# Patient Record
Sex: Female | Born: 1940
Health system: Southern US, Community
[De-identification: ages and names within clinical notes are randomized; demographics above are authoritative.]

## PROBLEM LIST (undated history)

## (undated) DIAGNOSIS — I639 Cerebral infarction, unspecified: Secondary | ICD-10-CM

## (undated) DIAGNOSIS — N289 Disorder of kidney and ureter, unspecified: Secondary | ICD-10-CM

## (undated) DIAGNOSIS — E785 Hyperlipidemia, unspecified: Secondary | ICD-10-CM

## (undated) DIAGNOSIS — E119 Type 2 diabetes mellitus without complications: Secondary | ICD-10-CM

## (undated) DIAGNOSIS — I10 Essential (primary) hypertension: Secondary | ICD-10-CM

## (undated) DIAGNOSIS — Z9289 Personal history of other medical treatment: Secondary | ICD-10-CM

## (undated) DIAGNOSIS — D649 Anemia, unspecified: Secondary | ICD-10-CM

## (undated) DIAGNOSIS — M8580 Other specified disorders of bone density and structure, unspecified site: Secondary | ICD-10-CM

## (undated) DIAGNOSIS — G709 Myoneural disorder, unspecified: Secondary | ICD-10-CM

## (undated) DIAGNOSIS — E039 Hypothyroidism, unspecified: Secondary | ICD-10-CM

## (undated) DIAGNOSIS — C801 Malignant (primary) neoplasm, unspecified: Secondary | ICD-10-CM

## (undated) DIAGNOSIS — I214 Non-ST elevation (NSTEMI) myocardial infarction: Secondary | ICD-10-CM

## (undated) DIAGNOSIS — I4891 Unspecified atrial fibrillation: Secondary | ICD-10-CM

## (undated) HISTORY — DX: Other specified disorders of bone density and structure, unspecified site: M85.80

## (undated) HISTORY — DX: Personal history of other medical treatment: Z92.89

## (undated) HISTORY — PX: CHOLECYSTECTOMY: SHX55

## (undated) HISTORY — PX: MASTECTOMY: SHX3

## (undated) HISTORY — DX: Type 2 diabetes mellitus without complications: E11.9

## (undated) HISTORY — DX: Essential (primary) hypertension: I10

## (undated) HISTORY — PX: BREAST SURGERY: SHX581

## (undated) HISTORY — DX: Hyperlipidemia, unspecified: E78.5

## (undated) HISTORY — DX: Disorder of kidney and ureter, unspecified: N28.9

## (undated) HISTORY — PX: TUBAL LIGATION: SHX77

## (undated) HISTORY — DX: Anemia, unspecified: D64.9

## (undated) HISTORY — PX: BREAST ENHANCEMENT SURGERY: SHX7

---

## 1998-05-20 HISTORY — PX: BREAST IMPLANT REMOVAL: SUR1101

## 1998-09-29 ENCOUNTER — Encounter: Admission: RE | Admit: 1998-09-29 | Discharge: 1998-12-28 | Payer: Self-pay | Admitting: Family Medicine

## 1999-02-19 ENCOUNTER — Other Ambulatory Visit: Admission: RE | Admit: 1999-02-19 | Discharge: 1999-02-19 | Payer: Self-pay | Admitting: Obstetrics and Gynecology

## 1999-03-30 ENCOUNTER — Ambulatory Visit (HOSPITAL_COMMUNITY): Admission: RE | Admit: 1999-03-30 | Discharge: 1999-03-30 | Payer: Self-pay | Admitting: Neurosurgery

## 1999-03-30 ENCOUNTER — Encounter: Payer: Self-pay | Admitting: Neurosurgery

## 1999-04-18 ENCOUNTER — Ambulatory Visit (HOSPITAL_COMMUNITY): Admission: RE | Admit: 1999-04-18 | Discharge: 1999-04-18 | Payer: Self-pay | Admitting: Neurosurgery

## 1999-04-18 ENCOUNTER — Encounter: Payer: Self-pay | Admitting: Neurosurgery

## 1999-04-26 ENCOUNTER — Encounter: Payer: Self-pay | Admitting: Internal Medicine

## 1999-04-26 ENCOUNTER — Encounter (INDEPENDENT_AMBULATORY_CARE_PROVIDER_SITE_OTHER): Payer: Self-pay

## 1999-04-26 ENCOUNTER — Inpatient Hospital Stay (HOSPITAL_COMMUNITY): Admission: EM | Admit: 1999-04-26 | Discharge: 1999-05-01 | Payer: Self-pay | Admitting: Internal Medicine

## 1999-04-26 ENCOUNTER — Encounter (INDEPENDENT_AMBULATORY_CARE_PROVIDER_SITE_OTHER): Payer: Self-pay | Admitting: Specialist

## 1999-04-30 ENCOUNTER — Encounter: Payer: Self-pay | Admitting: Internal Medicine

## 1999-05-02 ENCOUNTER — Encounter: Payer: Self-pay | Admitting: Neurosurgery

## 1999-05-02 ENCOUNTER — Ambulatory Visit (HOSPITAL_COMMUNITY): Admission: RE | Admit: 1999-05-02 | Discharge: 1999-05-02 | Payer: Self-pay | Admitting: Neurosurgery

## 1999-05-16 ENCOUNTER — Ambulatory Visit (HOSPITAL_COMMUNITY): Admission: RE | Admit: 1999-05-16 | Discharge: 1999-05-16 | Payer: Self-pay | Admitting: Neurosurgery

## 1999-05-16 ENCOUNTER — Encounter: Payer: Self-pay | Admitting: Neurosurgery

## 1999-06-13 ENCOUNTER — Ambulatory Visit (HOSPITAL_COMMUNITY): Admission: RE | Admit: 1999-06-13 | Discharge: 1999-06-13 | Payer: Self-pay | Admitting: Gastroenterology

## 1999-06-16 ENCOUNTER — Encounter: Payer: Self-pay | Admitting: Gastroenterology

## 1999-06-16 ENCOUNTER — Ambulatory Visit (HOSPITAL_COMMUNITY): Admission: RE | Admit: 1999-06-16 | Discharge: 1999-06-16 | Payer: Self-pay | Admitting: Gastroenterology

## 1999-06-26 ENCOUNTER — Encounter: Payer: Self-pay | Admitting: Plastic Surgery

## 1999-06-26 ENCOUNTER — Ambulatory Visit (HOSPITAL_COMMUNITY): Admission: RE | Admit: 1999-06-26 | Discharge: 1999-06-26 | Payer: Self-pay | Admitting: Plastic Surgery

## 1999-07-11 ENCOUNTER — Ambulatory Visit (HOSPITAL_COMMUNITY): Admission: RE | Admit: 1999-07-11 | Discharge: 1999-07-11 | Payer: Self-pay | Admitting: Plastic Surgery

## 1999-07-11 ENCOUNTER — Encounter: Payer: Self-pay | Admitting: Plastic Surgery

## 1999-08-16 ENCOUNTER — Other Ambulatory Visit: Admission: RE | Admit: 1999-08-16 | Discharge: 1999-08-16 | Payer: Self-pay | Admitting: Plastic Surgery

## 1999-08-16 ENCOUNTER — Encounter (INDEPENDENT_AMBULATORY_CARE_PROVIDER_SITE_OTHER): Payer: Self-pay | Admitting: Specialist

## 2000-02-21 ENCOUNTER — Other Ambulatory Visit: Admission: RE | Admit: 2000-02-21 | Discharge: 2000-02-21 | Payer: Self-pay | Admitting: Obstetrics and Gynecology

## 2000-02-22 ENCOUNTER — Encounter: Admission: RE | Admit: 2000-02-22 | Discharge: 2000-02-22 | Payer: Self-pay | Admitting: Internal Medicine

## 2000-02-22 ENCOUNTER — Encounter: Payer: Self-pay | Admitting: Internal Medicine

## 2000-05-20 HISTORY — PX: COLONOSCOPY: SHX174

## 2001-04-07 ENCOUNTER — Other Ambulatory Visit: Admission: RE | Admit: 2001-04-07 | Discharge: 2001-04-07 | Payer: Self-pay | Admitting: Obstetrics and Gynecology

## 2002-04-12 ENCOUNTER — Other Ambulatory Visit: Admission: RE | Admit: 2002-04-12 | Discharge: 2002-04-12 | Payer: Self-pay | Admitting: Obstetrics and Gynecology

## 2004-06-29 ENCOUNTER — Emergency Department (HOSPITAL_COMMUNITY): Admission: EM | Admit: 2004-06-29 | Discharge: 2004-06-29 | Payer: Self-pay | Admitting: Emergency Medicine

## 2004-10-12 ENCOUNTER — Ambulatory Visit: Payer: Self-pay | Admitting: Internal Medicine

## 2004-11-05 ENCOUNTER — Ambulatory Visit: Payer: Self-pay | Admitting: Internal Medicine

## 2004-12-07 ENCOUNTER — Encounter: Admission: RE | Admit: 2004-12-07 | Discharge: 2005-03-07 | Payer: Self-pay | Admitting: Internal Medicine

## 2005-03-11 ENCOUNTER — Ambulatory Visit: Payer: Self-pay | Admitting: Internal Medicine

## 2005-05-03 ENCOUNTER — Ambulatory Visit: Payer: Self-pay | Admitting: Internal Medicine

## 2005-06-17 ENCOUNTER — Ambulatory Visit: Payer: Self-pay | Admitting: Internal Medicine

## 2005-09-30 ENCOUNTER — Ambulatory Visit: Payer: Self-pay | Admitting: Internal Medicine

## 2005-10-09 ENCOUNTER — Ambulatory Visit: Payer: Self-pay | Admitting: Internal Medicine

## 2005-11-11 ENCOUNTER — Ambulatory Visit: Payer: Self-pay | Admitting: Internal Medicine

## 2006-01-22 ENCOUNTER — Ambulatory Visit: Payer: Self-pay | Admitting: Internal Medicine

## 2006-01-31 ENCOUNTER — Ambulatory Visit: Payer: Self-pay | Admitting: Internal Medicine

## 2006-02-17 ENCOUNTER — Ambulatory Visit: Payer: Self-pay | Admitting: Internal Medicine

## 2006-02-20 ENCOUNTER — Ambulatory Visit: Payer: Self-pay | Admitting: Internal Medicine

## 2006-03-03 ENCOUNTER — Ambulatory Visit: Payer: Self-pay | Admitting: Internal Medicine

## 2006-03-14 ENCOUNTER — Encounter: Admission: RE | Admit: 2006-03-14 | Discharge: 2006-03-14 | Payer: Self-pay | Admitting: Internal Medicine

## 2006-03-24 ENCOUNTER — Encounter: Admission: RE | Admit: 2006-03-24 | Discharge: 2006-03-24 | Payer: Self-pay | Admitting: Internal Medicine

## 2006-03-28 ENCOUNTER — Ambulatory Visit: Payer: Self-pay | Admitting: Internal Medicine

## 2006-04-04 ENCOUNTER — Encounter: Admission: RE | Admit: 2006-04-04 | Discharge: 2006-04-04 | Payer: Self-pay | Admitting: Internal Medicine

## 2006-04-04 HISTORY — PX: OTHER SURGICAL HISTORY: SHX169

## 2006-05-06 ENCOUNTER — Ambulatory Visit: Payer: Self-pay | Admitting: Internal Medicine

## 2006-06-20 ENCOUNTER — Ambulatory Visit: Payer: Self-pay | Admitting: Internal Medicine

## 2006-06-20 LAB — CONVERTED CEMR LAB
ALT: 16 units/L (ref 0–40)
AST: 22 units/L (ref 0–37)
BUN: 8 mg/dL (ref 6–23)
Basophils Relative: 2.1 % — ABNORMAL HIGH (ref 0.0–1.0)
Creatinine, Ser: 1 mg/dL (ref 0.4–1.2)
Eosinophils Absolute: 0.3 10*3/uL (ref 0.0–0.6)
Eosinophils Relative: 6.6 % — ABNORMAL HIGH (ref 0.0–5.0)
Free T4: 0.6 ng/dL (ref 0.6–1.6)
HCT: 36.1 % (ref 36.0–46.0)
HDL: 43.3 mg/dL (ref >39.0)
MCV: 89.3 fL (ref 78.0–100.0)
Neutrophils Relative %: 65 % (ref 43.0–77.0)
Platelets: 251 10*3/uL (ref 150–400)
Potassium: 4 meq/L (ref 3.5–5.1)
RBC: 4.05 M/uL (ref 3.87–5.11)
RDW: 13.5 % (ref 11.5–14.6)
TSH: 9.63 microintl units/mL — ABNORMAL HIGH (ref 0.35–5.50)
Triglycerides: 208 mg/dL (ref 0–149)
VLDL: 42 mg/dL — ABNORMAL HIGH (ref 0–40)
WBC: 4.9 10*3/uL (ref 4.5–10.5)

## 2006-06-24 DIAGNOSIS — I10 Essential (primary) hypertension: Secondary | ICD-10-CM | POA: Insufficient documentation

## 2006-06-24 DIAGNOSIS — M949 Disorder of cartilage, unspecified: Secondary | ICD-10-CM

## 2006-06-24 DIAGNOSIS — M899 Disorder of bone, unspecified: Secondary | ICD-10-CM | POA: Insufficient documentation

## 2006-06-27 ENCOUNTER — Ambulatory Visit: Payer: Self-pay | Admitting: Internal Medicine

## 2006-08-30 ENCOUNTER — Emergency Department (HOSPITAL_COMMUNITY): Admission: EM | Admit: 2006-08-30 | Discharge: 2006-08-31 | Payer: Self-pay | Admitting: Pediatrics

## 2006-09-09 ENCOUNTER — Ambulatory Visit: Payer: Self-pay | Admitting: Internal Medicine

## 2006-10-14 ENCOUNTER — Ambulatory Visit: Payer: Self-pay | Admitting: Internal Medicine

## 2006-10-14 LAB — CONVERTED CEMR LAB
LDL Cholesterol: 46 mg/dL (ref 0–99)
TSH: 5.69 microintl units/mL — ABNORMAL HIGH (ref 0.35–5.50)
Total CHOL/HDL Ratio: 2.6
Triglycerides: 110 mg/dL (ref 0–149)

## 2006-10-21 ENCOUNTER — Telehealth: Payer: Self-pay | Admitting: Internal Medicine

## 2006-10-31 ENCOUNTER — Encounter: Payer: Self-pay | Admitting: Internal Medicine

## 2006-11-25 ENCOUNTER — Telehealth (INDEPENDENT_AMBULATORY_CARE_PROVIDER_SITE_OTHER): Payer: Self-pay | Admitting: *Deleted

## 2006-12-11 ENCOUNTER — Ambulatory Visit: Payer: Self-pay | Admitting: Internal Medicine

## 2006-12-11 LAB — CONVERTED CEMR LAB
ALT: 13 units/L (ref 0–35)
AST: 20 units/L (ref 0–37)
Cholesterol: 120 mg/dL (ref 0–200)
TSH: 1.99 microintl units/mL (ref 0.35–5.50)
Total CHOL/HDL Ratio: 3.1

## 2006-12-16 ENCOUNTER — Ambulatory Visit: Payer: Self-pay | Admitting: Internal Medicine

## 2006-12-16 LAB — CONVERTED CEMR LAB: LDL Goal: 100 mg/dL

## 2006-12-31 ENCOUNTER — Encounter: Payer: Self-pay | Admitting: Internal Medicine

## 2007-03-11 ENCOUNTER — Encounter: Payer: Self-pay | Admitting: Internal Medicine

## 2007-06-22 ENCOUNTER — Ambulatory Visit: Payer: Self-pay | Admitting: Internal Medicine

## 2007-06-22 DIAGNOSIS — E782 Mixed hyperlipidemia: Secondary | ICD-10-CM | POA: Insufficient documentation

## 2007-06-22 DIAGNOSIS — R5381 Other malaise: Secondary | ICD-10-CM | POA: Insufficient documentation

## 2007-06-22 DIAGNOSIS — R5383 Other fatigue: Secondary | ICD-10-CM

## 2007-06-22 DIAGNOSIS — M6530 Trigger finger, unspecified finger: Secondary | ICD-10-CM | POA: Insufficient documentation

## 2007-06-22 DIAGNOSIS — E1129 Type 2 diabetes mellitus with other diabetic kidney complication: Secondary | ICD-10-CM | POA: Insufficient documentation

## 2007-06-29 ENCOUNTER — Encounter (INDEPENDENT_AMBULATORY_CARE_PROVIDER_SITE_OTHER): Payer: Self-pay | Admitting: *Deleted

## 2007-06-29 LAB — CONVERTED CEMR LAB
BUN: 16 mg/dL (ref 6–23)
Basophils Absolute: 0.1 10*3/uL (ref 0.0–0.1)
Hemoglobin: 11.6 g/dL — ABNORMAL LOW (ref 12.0–15.0)
Hgb A1c MFr Bld: 5.7 % (ref 4.6–6.0)
Lymphocytes Relative: 36.1 % (ref 12.0–46.0)
MCHC: 32.5 g/dL (ref 30.0–36.0)
Monocytes Absolute: 0.4 10*3/uL (ref 0.2–0.7)
Monocytes Relative: 10.9 % (ref 3.0–11.0)
Neutro Abs: 1.9 10*3/uL (ref 1.4–7.7)
Platelets: 251 10*3/uL (ref 150–400)
RDW: 12.8 % (ref 11.5–14.6)

## 2007-08-26 ENCOUNTER — Telehealth (INDEPENDENT_AMBULATORY_CARE_PROVIDER_SITE_OTHER): Payer: Self-pay | Admitting: *Deleted

## 2007-09-08 ENCOUNTER — Telehealth (INDEPENDENT_AMBULATORY_CARE_PROVIDER_SITE_OTHER): Payer: Self-pay | Admitting: *Deleted

## 2007-09-15 ENCOUNTER — Ambulatory Visit: Payer: Self-pay | Admitting: Internal Medicine

## 2007-09-23 ENCOUNTER — Encounter: Admission: RE | Admit: 2007-09-23 | Discharge: 2007-10-08 | Payer: Self-pay | Admitting: Internal Medicine

## 2007-09-28 ENCOUNTER — Encounter: Payer: Self-pay | Admitting: Internal Medicine

## 2007-10-08 ENCOUNTER — Encounter: Payer: Self-pay | Admitting: Internal Medicine

## 2007-11-06 ENCOUNTER — Telehealth (INDEPENDENT_AMBULATORY_CARE_PROVIDER_SITE_OTHER): Payer: Self-pay | Admitting: *Deleted

## 2007-12-28 ENCOUNTER — Ambulatory Visit: Payer: Self-pay | Admitting: Internal Medicine

## 2008-01-02 LAB — CONVERTED CEMR LAB: Uric Acid, Serum: 7.7 mg/dL — ABNORMAL HIGH (ref 2.4–7.0)

## 2008-01-04 ENCOUNTER — Encounter (INDEPENDENT_AMBULATORY_CARE_PROVIDER_SITE_OTHER): Payer: Self-pay | Admitting: *Deleted

## 2008-01-07 ENCOUNTER — Ambulatory Visit: Payer: Self-pay | Admitting: Internal Medicine

## 2008-01-07 DIAGNOSIS — N3941 Urge incontinence: Secondary | ICD-10-CM | POA: Insufficient documentation

## 2008-03-30 ENCOUNTER — Telehealth (INDEPENDENT_AMBULATORY_CARE_PROVIDER_SITE_OTHER): Payer: Self-pay | Admitting: *Deleted

## 2008-07-08 ENCOUNTER — Ambulatory Visit: Payer: Self-pay | Admitting: Internal Medicine

## 2008-07-25 LAB — CONVERTED CEMR LAB
AST: 20 units/L (ref 0–37)
Alkaline Phosphatase: 47 units/L (ref 39–117)
BUN: 15 mg/dL (ref 6–23)
Bilirubin, Direct: 0.2 mg/dL (ref 0.0–0.3)
Cholesterol: 115 mg/dL (ref 0–200)
Creatinine, Ser: 1.2 mg/dL (ref 0.4–1.2)
HDL: 43.8 mg/dL (ref >39.0)
TSH: 1.97 microintl units/mL (ref 0.35–5.50)
Total Bilirubin: 1.1 mg/dL (ref 0.3–1.2)
VLDL: 24 mg/dL (ref 0–40)

## 2008-07-26 ENCOUNTER — Encounter (INDEPENDENT_AMBULATORY_CARE_PROVIDER_SITE_OTHER): Payer: Self-pay | Admitting: *Deleted

## 2008-08-03 ENCOUNTER — Telehealth (INDEPENDENT_AMBULATORY_CARE_PROVIDER_SITE_OTHER): Payer: Self-pay | Admitting: *Deleted

## 2008-09-15 ENCOUNTER — Ambulatory Visit: Payer: Self-pay | Admitting: Internal Medicine

## 2008-09-16 ENCOUNTER — Ambulatory Visit: Payer: Self-pay | Admitting: Internal Medicine

## 2008-09-17 LAB — CONVERTED CEMR LAB
BUN: 14 mg/dL (ref 6–23)
Potassium: 3.8 meq/L (ref 3.5–5.1)

## 2008-09-19 ENCOUNTER — Encounter (INDEPENDENT_AMBULATORY_CARE_PROVIDER_SITE_OTHER): Payer: Self-pay | Admitting: *Deleted

## 2008-10-06 ENCOUNTER — Encounter: Payer: Self-pay | Admitting: Internal Medicine

## 2009-01-16 ENCOUNTER — Ambulatory Visit: Payer: Self-pay | Admitting: Internal Medicine

## 2009-01-17 ENCOUNTER — Telehealth (INDEPENDENT_AMBULATORY_CARE_PROVIDER_SITE_OTHER): Payer: Self-pay | Admitting: *Deleted

## 2009-02-10 ENCOUNTER — Ambulatory Visit: Payer: Self-pay | Admitting: Internal Medicine

## 2009-02-16 ENCOUNTER — Encounter (INDEPENDENT_AMBULATORY_CARE_PROVIDER_SITE_OTHER): Payer: Self-pay | Admitting: *Deleted

## 2009-02-17 LAB — CONVERTED CEMR LAB
AST: 20 units/L (ref 0–37)
Albumin: 3.8 g/dL (ref 3.5–5.2)
Alkaline Phosphatase: 48 units/L (ref 39–117)
BUN: 8 mg/dL (ref 6–23)
Hgb A1c MFr Bld: 5.5 % (ref 4.6–6.5)
LDL Cholesterol: 38 mg/dL (ref 0–99)
Potassium: 4 meq/L (ref 3.5–5.1)
TSH: 6.72 microintl units/mL — ABNORMAL HIGH (ref 0.35–5.50)
Total Bilirubin: 0.9 mg/dL (ref 0.3–1.2)
Total CHOL/HDL Ratio: 2

## 2009-02-22 ENCOUNTER — Telehealth (INDEPENDENT_AMBULATORY_CARE_PROVIDER_SITE_OTHER): Payer: Self-pay | Admitting: *Deleted

## 2009-03-01 ENCOUNTER — Ambulatory Visit: Payer: Self-pay | Admitting: Internal Medicine

## 2009-03-01 DIAGNOSIS — E559 Vitamin D deficiency, unspecified: Secondary | ICD-10-CM | POA: Insufficient documentation

## 2009-05-01 ENCOUNTER — Ambulatory Visit: Payer: Self-pay | Admitting: Internal Medicine

## 2009-05-04 LAB — CONVERTED CEMR LAB
ALT: 13 units/L (ref 0–35)
AST: 19 units/L (ref 0–37)
Alkaline Phosphatase: 47 units/L (ref 39–117)
HDL: 48.1 mg/dL (ref >39.00)
TSH: 1.25 microintl units/mL (ref 0.35–5.50)
Total Bilirubin: 0.7 mg/dL (ref 0.3–1.2)
Total CHOL/HDL Ratio: 3
Triglycerides: 105 mg/dL (ref 0.0–149.0)

## 2009-05-05 ENCOUNTER — Encounter (INDEPENDENT_AMBULATORY_CARE_PROVIDER_SITE_OTHER): Payer: Self-pay | Admitting: *Deleted

## 2009-05-08 ENCOUNTER — Ambulatory Visit: Payer: Self-pay | Admitting: Internal Medicine

## 2009-05-24 ENCOUNTER — Telehealth (INDEPENDENT_AMBULATORY_CARE_PROVIDER_SITE_OTHER): Payer: Self-pay | Admitting: *Deleted

## 2009-07-31 ENCOUNTER — Ambulatory Visit: Payer: Self-pay | Admitting: Internal Medicine

## 2009-08-01 ENCOUNTER — Encounter: Payer: Self-pay | Admitting: Internal Medicine

## 2009-08-01 LAB — CONVERTED CEMR LAB: Vit D, 25-Hydroxy: 37 ng/mL (ref 30–89)

## 2009-08-07 ENCOUNTER — Ambulatory Visit: Payer: Self-pay | Admitting: Internal Medicine

## 2009-08-07 LAB — CONVERTED CEMR LAB
BUN: 8 mg/dL (ref 6–23)
Creatinine, Ser: 0.8 mg/dL (ref 0.4–1.2)

## 2009-10-30 ENCOUNTER — Telehealth (INDEPENDENT_AMBULATORY_CARE_PROVIDER_SITE_OTHER): Payer: Self-pay | Admitting: *Deleted

## 2009-12-04 ENCOUNTER — Telehealth (INDEPENDENT_AMBULATORY_CARE_PROVIDER_SITE_OTHER): Payer: Self-pay | Admitting: *Deleted

## 2010-06-19 NOTE — Progress Notes (Signed)
Summary: Refill Request  Phone Note Refill Request Call back at Home Phone [phone] Message from:  Patient on October 30, 2009 9:23 AM  Refills Requested: Medication #1:  NEURONTIN 300 MG  CAPS TAKE 1 EVERYDAY   Dosage confirmed as above?Dosage Confirmed   Supply Requested: 1 month  Method Requested: Pick up at Office Next Appointment Scheduled: Sept 19 2011 Initial call taken by: Harold Barban,  October 30, 2009 9:24 AM  Follow-up for Phone Call        Spoke with patient, reason for taking med>Neuropathy in feet and legs @ night   Patient would like to pick up RX tomorrow or Wed, I informed patient it will be at the front waiting Follow-up by: Shonna Chock,  October 30, 2009 1:23 PM    New/Updated Medications: NEURONTIN 300 MG  CAPS (GABAPENTIN) 1 by mouth once daily as needed [id number] in feet and legs @ night Prescriptions: NEURONTIN 300 MG  CAPS (GABAPENTIN) 1 by mouth once daily as needed [id number] in feet and legs @ night  #90 x 1   Entered by:   Shonna Chock   Authorized by:   Marga Melnick MD   Signed by:   Shonna Chock on 10/30/2009   Method used:   Print then Give to Patient   RxID:   [id number]

## 2010-06-19 NOTE — Assessment & Plan Note (Signed)
Summary: 3 MONTH FOLLOWUP, TO DISCUSS LABS////SPH   Vital Signs:  Patient profile:   70 year old female Weight:      159.0 pounds Pulse rate:   64 / minute Resp:     15 per minute BP sitting:   140 / 84  (left arm) Cuff size:   large  Vitals Entered By: Shonna Chock (August 07, 2009 9:22 AM) CC: 1.) 3 Month follow-up (copy of labs given)-Refill HCTZ and Carvediol  2.) Cold all the time 3.) Discuss vit B12 and CoQ10 supplements Comments REVIEWED MED LIST, PATIENT AGREED DOSE AND INSTRUCTION CORRECT    CC:  1.) 3 Month follow-up (copy of labs given)-Refill HCTZ and Carvediol  2.) Cold all the time 3.) Discuss vit B12 and CoQ10 supplements.  History of Present Illness: Vitamin D only up from 35 to 37 with 50,000 International Units weekly. Other labs reviewed ; A1c now in NON Diabetes range. She is on [employer]  with 57# loss, now a Lifetime Member. FBS 80-95; rare  hypoglycemia when she skips a meal.  Allergies: 1)  * Aldactone  Review of Systems Eyes:  Denies blurring, double vision, and vision loss-both eyes; No retinopathy. CV:  Denies chest pain or discomfort, lightheadness, and near fainting. Derm:  Denies poor wound healing. Neuro:  Complains of numbness and tingling. Endo:  Denies excessive hunger, excessive thirst, and excessive urination.  Physical Exam  General:  well-nourished,looks great; alert,appropriate and cooperative throughout examination Heart:  Normal rate and regular rhythm. Increased  S2 normal without gallop, murmur, click, rub . S4 with slurring Pulses:  R and L carotid,radial,dorsalis pedis and posterior tibial pulses are full and equal bilaterally Extremities:  No clubbing, cyanosis, edema. Neurologic:  alert & oriented X3.     Impression & Recommendations:  Problem # 1:  DIABETES MELLITUS, TYPE II, CONTROLLED (ICD-250.00) A1c now in NON Diabetes range The following medications were removed from the medication list:    Metformin Hcl 1000 Mg  Tabs (Metformin hcl) .Marland Kitchen... 1/2 once daily with largest meal Her updated medication list for this problem includes:    Benazepril Hcl 40 Mg Tabs (Benazepril hcl) .Marland Kitchen... 1 two times a day  Problem # 2:  VITAMIN D DEFICIENCY (ICD-268.9) essentially unchanged  Complete Medication List: 1)  Neurontin 300 Mg Caps (Gabapentin) .... Take 1 everyday 2)  L-thyroxine  .... 2 tabs daily except 1 and 1/2 tabs weds only 3)  Carvedilol 25 Mg Tabs (Carvedilol) .Marland Kitchen.. 1 by mouth bid 4)  Vytorin 10-20 Mg Tabs (Ezetimibe-simvastatin) .... 1/2 at bedtime m,w, f & sun only 5)  Benazepril Hcl 40 Mg Tabs (Benazepril hcl) .Marland Kitchen.. 1 two times a day 6)  Hydrochlorothiazide 12.5 Mg Tabs (Hydrochlorothiazide) .Marland Kitchen.. 1 by mouth qd 7)  Accu-chek Compact Strp (Glucose blood) .... Pt test 1 time a day 8)  Vitamin D (ergocalciferol) 50000 Unit Caps (Ergocalciferol) .Marland Kitchen.. 1pill once weekly  Patient Instructions: 1)  Please schedule a follow-up appointment in 6 months. 2)  BMP prior to visit, ICD-9: 3)  Hepatic Panel prior to visit, ICD-9: 4)  Lipid Panel prior to visit, ICD-9: 5)  TSH prior to visit, ICD-9: 6)  HbgA1C prior to visit, ICD-9: 7)  Urine Microalbumin prior to visit, ICD-9:; vitamin D level Prescriptions: CARVEDILOL 25 MG  TABS (CARVEDILOL) 1 by mouth bid  #180 x 1   Entered and Authorized by:   Marga Melnick MD   Signed by:   Marga Melnick MD on 08/07/2009  Method used:   Printed then faxed to ...       [facility] [address] (retail)       [phone]       [place], [place]  [address]       Ph: [phone]       Fax: [phone]   RxID:   [id number] HYDROCHLOROTHIAZIDE 12.5 MG  TABS (HYDROCHLOROTHIAZIDE) 1 by mouth qd  #90 x 1   Entered and Authorized by:   Marga Melnick MD   Signed by:   Marga Melnick MD on 08/07/2009   Method used:   Printed then faxed to ...       [facility] [address] (retail)       [phone]       [place], [place]  [address]       Ph: [phone]       Fax: [phone]   RxID:   [id number] VITAMIN D (ERGOCALCIFEROL) 50000 UNIT CAPS (ERGOCALCIFEROL) 1pill ONCE WEEKLY  #12 x 1   Entered and Authorized by:   Marga Melnick MD   Signed by:   Marga Melnick MD on 08/07/2009   Method used:   Printed then faxed to ...       [facility] [address] (retail)       [phone]       [place], [place]  [address]       Ph: [phone]       Fax: [phone]   RxID:   [id number]

## 2010-06-19 NOTE — Progress Notes (Signed)
Summary: DOES SHE NEED LAB WORK IN MARCH??  Phone Note Call from Patient Call back at [facility] Phone [phone]   Caller: Patient Summary of Call: PATIENT SAYS HER NOTE AT BOTTOM OF DEC 2010 OFFICE VISIT SAYS TO SCHEDULE A 3  MONTH FOLLOWUP  SHE SAYS SHE NEEDS LABS TOO, SO I GAVE HER AN APPOINTMENT FOR 07/31/2009 FOR LAB WORK--REPEATING DEC LABS, BUT I HAD NO DIAGNOSIS CODES  SHE HAS A OFFICE VISIT WITH DR HOPPER FOR 08/07/2009  PLEASE VERIFY IF SHE NEEDS THE LAB WORK DONE AGAIN AND --IF SO, TELL ME THE DIAG CODES AND I WILL CALL HER TO TELL HER TO KEEP THE LAB APPOINTMENT AS WELL AS DOCTOR APPOINTMENT  IF NOT , LET ME KNOW AND I WILL CALL HER AND TELL HER LAB APPOINTMENT IS NOT NECESSARY  IT IS OK TO LEAVE MESSAGE Initial call taken by: Jerolyn Shin,  May 24, 2009 4:25 PM  Follow-up for Phone Call        BUN,creat,K+;vit D level;HbgA1C 995.20,268.9,250.00,272.2..........Marland KitchenFelecia Deloach CMA  May 25, 2009 10:45 AM   Additional Follow-up for Phone Call Additional follow up Details #1::        left pt detail message both appt are necessary, codes added to lab orders.........Marland KitchenFelecia Deloach CMA  May 25, 2009 10:49 AM

## 2010-06-19 NOTE — Progress Notes (Signed)
Summary: refill  Phone Note Refill Request Call back at Home Phone [phone] Message from:  Patient  Refills Requested: Medication #1:  BENAZEPRIL HCL 40 MG  TABS 1 two times a day  Medication #2:  L-THYROXINE 2 tabs daily except 1 and 1/2 tabs Weds only 90 day supply patient will pick up to send to mail order    Initial call taken by: Okey Regal [place],  December 04, 2009 3:36 PM  Follow-up for Phone Call        Left message on VM inform patient rx will be avaliable tomorrow between 8am:5pm Follow-up by: Shonna Chock CMA,  December 04, 2009 3:52 PM    Prescriptions: L-THYROXINE 2 tabs daily except 1 and 1/2 tabs Weds only  #100 x 1   Entered by:   Shonna Chock CMA   Authorized by:   Marga Melnick MD   Signed by:   Shonna Chock CMA on 12/04/2009   Method used:   Print then Give to Patient   RxID:   [id number] BENAZEPRIL HCL 40 MG  TABS (BENAZEPRIL HCL) 1 two times a day  #180 x 1   Entered by:   Shonna Chock CMA   Authorized by:   Marga Melnick MD   Signed by:   Shonna Chock CMA on 12/04/2009   Method used:   Print then Give to Patient   RxID:   [id number]

## 2010-07-04 ENCOUNTER — Encounter: Payer: Self-pay | Admitting: Internal Medicine

## 2010-07-04 ENCOUNTER — Ambulatory Visit (INDEPENDENT_AMBULATORY_CARE_PROVIDER_SITE_OTHER): Payer: Medicare Other | Admitting: Internal Medicine

## 2010-07-04 ENCOUNTER — Other Ambulatory Visit: Payer: Self-pay | Admitting: Internal Medicine

## 2010-07-04 DIAGNOSIS — R609 Edema, unspecified: Secondary | ICD-10-CM

## 2010-07-04 DIAGNOSIS — E559 Vitamin D deficiency, unspecified: Secondary | ICD-10-CM

## 2010-07-04 DIAGNOSIS — E119 Type 2 diabetes mellitus without complications: Secondary | ICD-10-CM

## 2010-07-04 DIAGNOSIS — E039 Hypothyroidism, unspecified: Secondary | ICD-10-CM

## 2010-07-04 DIAGNOSIS — I10 Essential (primary) hypertension: Secondary | ICD-10-CM

## 2010-07-04 LAB — BUN: BUN: 14 mg/dL (ref 6–23)

## 2010-07-11 NOTE — Assessment & Plan Note (Signed)
Summary: sinus/kn   Vital Signs:  Patient profile:   70 year old female Weight:      166.6 pounds BMI:     26.78 Temp:     98.8 degrees F oral Pulse rate:   56 / minute Resp:     15 per minute BP sitting:   140 / 78  (left arm) Cuff size:   large  Vitals Entered By: Shonna Chock CMA (July 04, 2010 11:54 AM) CC: 1.) Sinuses x few days: examine right side of face  2.) Refill Meds , URI symptoms   CC:  1.) Sinuses x few days: examine right side of face  2.) Refill Meds  and URI symptoms.  History of Present Illness:    Onset 07/01/2010 as "knot" R maxillary area; she now reports nasal congestion, but denies purulent nasal discharge, sore throat, productive cough, and earache.  The patient denies fever, dyspnea, and wheezing.  The patient also reports frontal pressure  headache.  Risk factors for Strep sinusitis include unilateral facial burning  pain in subcutaneous tissues.  The patient denies the following risk factors for Strep sinusitis: tooth pain and tender adenopathy. Note : she is on an ACE-I. Rx: NSAIDS      The patient also presents for Hypertension follow-up.  The patient reports intermittent  fatigue, but denies lightheadedness, urinary frequency, pedal edema, angioedema of lips & tongue and rash.  The patient denies the following associated symptoms: chest pain, chest pressure, exercise intolerance, dyspnea, palpitations, and syncope.  Compliance with medications (by patient report) has been near 100%.  The patient reports that dietary compliance has been excellent.  The patient reports exercising 3 X per week.  Adjunctive measures currently used by the patient include salt restriction.  BP @ home : 150-170/50-60.   Allergies: 1)  * Aldactone  Review of Systems MS:  Note : vit D 50,000 International Units lat taken 4 weeks ago.  Physical Exam  General:  well-nourished,in no acute distress; alert,appropriate and cooperative throughout examination Eyes:  No corneal or  conjunctival inflammation noted. EOMI. Perrla. Vision grossly normal. Ears:  External ear exam shows no significant lesions or deformities.  Otoscopic examination reveals clear canals, tympanic membranes are intact bilaterally without bulging, retraction, inflammation or discharge. Hearing is grossly normal bilaterally. Nose:  External nasal examination shows no deformity or inflammation. Nasal mucosa are pink and moist without lesions or exudates. Mouth:  Oral mucosa and oropharynx without lesions or exudates.  Teeth in good repair. Edematous ,slightly tender subcutaneous changes over R maxilla Lungs:  Normal respiratory effort, chest expands symmetrically. Lungs are clear to auscultation, no crackles or wheezes. Heart:  regular rhythm, no gallop, no rub, no JVD, bradycardia, and grade 1/2  /6 systolic murmur.   Pulses:  R and L carotid,radial,dorsalis pedis and posterior tibial pulses are full and equal bilaterally Extremities:  No clubbing, cyanosis, edema. Skin:  see facial changes Cervical Nodes:  No lymphadenopathy noted Axillary Nodes:  No palpable lymphadenopathy Psych:  memory intact for recent and remote, normally interactive, and good eye contact.     Impression & Recommendations:  Problem # 1:  EDEMA- LOCALIZED (ICD-782.3)  R/O angioedema variant from ACE-I Her updated medication list for this problem includes:    Hydrochlorothiazide 12.5 Mg Tabs (Hydrochlorothiazide) .Marland Kitchen... 1 by mouth qd  Orders: Venipuncture (81191)  Problem # 2:  HYPERTENSION (ICD-401.9)  The following medications were removed from the medication list:    Benazepril Hcl 40 Mg Tabs (Benazepril hcl) .Marland KitchenMarland KitchenMarland KitchenMarland Kitchen  1 two times a day Her updated medication list for this problem includes:    Carvedilol 25 Mg Tabs (Carvedilol) .Marland Kitchen... 1 by mouth bid    Hydrochlorothiazide 12.5 Mg Tabs (Hydrochlorothiazide) .Marland Kitchen... 1 by mouth qd    Amlodipine Besylate 5 Mg Tabs (Amlodipine besylate) .Marland Kitchen... 1 once daily (in place of  benazepril)  Orders: Venipuncture (04540) TLB-Creatinine, Blood (82565-CREA) TLB-Potassium (K+) (84132-K) TLB-BUN (Urea Nitrogen) (84520-BUN)  Problem # 3:  VITAMIN D DEFICIENCY (ICD-268.9)  Orders: Venipuncture (98119) T-Vitamin D (25-Hydroxy) (14782-95621)  Problem # 4:  HYPOTHYROIDISM (ICD-244.9)  Orders: Venipuncture (30865) TLB-TSH (Thyroid Stimulating Hormone) (84443-TSH)  Problem # 5:  DIABETES MELLITUS, TYPE II, CONTROLLED (ICD-250.00)  The following medications were removed from the medication list:    Benazepril Hcl 40 Mg Tabs (Benazepril hcl) .Marland Kitchen... 1 two times a day  Orders: TLB-A1C / Hgb A1C (Glycohemoglobin) (83036-A1C)  Complete Medication List: 1)  Neurontin 300 Mg Caps (Gabapentin) .Marland Kitchen.. 1 by mouth once daily as needed [id number] in feet and legs @ night 2)  L-thyroxine  .... 2 tabs daily except 1 and 1/2 tabs weds only 3)  Carvedilol 25 Mg Tabs (Carvedilol) .Marland Kitchen.. 1 by mouth bid 4)  Vytorin 10-20 Mg Tabs (Ezetimibe-simvastatin) .... 1/2 at bedtime m,w, f & sun only 5)  Hydrochlorothiazide 12.5 Mg Tabs (Hydrochlorothiazide) .Marland Kitchen.. 1 by mouth qd 6)  Accu-chek Compact Strp (Glucose blood) .... Pt test 1 time a day 7)  Vitamin D (ergocalciferol) 50000 Unit Caps (Ergocalciferol) .Marland Kitchen.. 1pill once weekly 8)  Amlodipine Besylate 5 Mg Tabs (Amlodipine besylate) .Marland Kitchen.. 1 once daily (in place of benazepril)  Patient Instructions: 1)  Report signs of Sinusitis(pain , pus & fever). 2)  Check your Blood Pressure regularly. If it is above:135/85  you should make an appointment. Prescriptions: HYDROCHLOROTHIAZIDE 12.5 MG  TABS (HYDROCHLOROTHIAZIDE) 1 by mouth qd  #90 x 1   Entered and Authorized by:   Marga Melnick MD   Signed by:   Marga Melnick MD on 07/04/2010   Method used:   Printed then faxed to ...       [facility] [address] (retail)       [phone]       [place], [place]  [address]       Ph: [phone]       Fax:  [phone]   RxID:   [id number] L-THYROXINE 2 tabs daily except 1 and 1/2 tabs Weds only  #100 x 1   Entered and Authorized by:   Marga Melnick MD   Signed by:   Marga Melnick MD on 07/04/2010   Method used:   Printed then faxed to ...       [facility] [address] (retail)       [phone]       [place], [place]  [address]       Ph: [phone]       Fax: [phone]   RxID:   [id number] NEURONTIN 300 MG  CAPS (GABAPENTIN) 1 by mouth once daily as needed [id number]:[id number] in feet and legs @ night  #90 x 1   Entered and Authorized by:   Marga Melnick MD   Signed by:   Marga Melnick MD on 07/04/2010   Method used:   Printed then faxed to ...       [facility] [address] (retail)  [address]       [place], [place]  [address]       Ph: [phone]       Fax: [phone]   RxID:   [id number] AMLODIPINE BESYLATE 5 MG TABS (AMLODIPINE BESYLATE) 1 once daily (in place of Benazepril)  #30 x 5   Entered and Authorized by:   Marga Melnick MD   Signed by:   Marga Melnick MD on 07/04/2010   Method used:   Electronically to        [facility] [address] (retail)       [phone]       [place], [place]  [address]       Ph: [phone]       Fax: [phone]   RxID:   [id number]    Orders Added: 1)  Est. Patient Level IV [66063] 2)  Venipuncture [01601] 3)  TLB-TSH (Thyroid Stimulating Hormone) [84443-TSH] 4)  TLB-Creatinine, Blood [82565-CREA] 5)  TLB-Potassium (K+) [84132-K] 6)  TLB-BUN (Urea Nitrogen) [84520-BUN] 7)  T-Vitamin D (25-Hydroxy) [09323-55732] 8)  TLB-A1C / Hgb A1C (Glycohemoglobin) [83036-A1C]

## 2010-08-06 ENCOUNTER — Telehealth: Payer: Self-pay

## 2010-08-16 NOTE — Progress Notes (Signed)
Summary: Triage: B/P Med concerns   Phone Note Call from Patient Call back at Home Phone [phone]   Caller: Patient Summary of Call: Message left on Triage voicemail: Please call to discuss B/P Meds   [facility] CMA  August 06, 2010 2:07 PM   Follow-up for Phone Call        Spoke with patient, patient was seen for swelling in face and B/P med was changed. 1-2 weeks into taking med patient with leg swelling and breaking out (hives). Patient stopped med last Friday, patient would like to know what to do about B/P. [facility]: [facility]   [phone], 159/49 (last 2 readings) Follow-up by: Shonna Chock CMA,  August 06, 2010 3:05 PM  Additional Follow-up for Phone Call Additional follow up Details #1::         change amlodipine to losartan 100 mg daily. Monitor blood pressure ; goal  is average less than 135/85. Additional Follow-up by: Marga Melnick MD,  August 06, 2010 4:52 PM   New Allergies: ! AMLODIPINE BESYLATE (AMLODIPINE BESYLATE) Additional Follow-up for Phone Call Additional follow up Details #2::    Spoke with patient, all information ok'd.  Follow-up by: Shonna Chock CMA,  August 06, 2010 5:07 PM  New/Updated Medications: LOSARTAN POTASSIUM 100 MG TABS (LOSARTAN POTASSIUM) 1 once daily New Allergies: ! AMLODIPINE BESYLATE (AMLODIPINE BESYLATE)Prescriptions: LOSARTAN POTASSIUM 100 MG TABS (LOSARTAN POTASSIUM) 1 once daily  #30 x 5   Entered and Authorized by:   Marga Melnick MD   Signed by:   Marga Melnick MD on 08/06/2010   Method used:   Electronically to        [facility] (retail)       [phone]       [place], [place]  [address]       Ph: [phone]       Fax: [phone]   RxID:   [id number]

## 2010-11-19 ENCOUNTER — Other Ambulatory Visit: Payer: Self-pay | Admitting: Internal Medicine

## 2010-11-19 NOTE — Telephone Encounter (Signed)
Patient called back and stated she would like a 30 day rx sent in for synthroid only([facility]), patient with several weeks worth of Carvedilol pills, patient never increased her synthroid as indicated on lab append from 07/04/10 (patient did not receive labs in mail), patient aware I will re-mail labs and discuss with Dr.Hopper if she should  Increase med now to 100mg  daily (TSH 6.74) and then recheck in 3 months or if he would like to go forward with re-checking level and then decided on med adjustments  Dr.Hopper please advise (patient aware Dr.Hopper is out of the office until Thursday afternoon)

## 2010-11-19 NOTE — Telephone Encounter (Signed)
Left message on voicemail informing patient she is due for labs based on lab append from 07/04/10:  recheck TSH with fasting Lipids & hepatic panel  in 3 months (244.9, 272.4, 995.20). (Labs were due 10/02/10)  Patient to also let me know when she calls back if she would like 30day rx sent to pharmacy to hold her over until labs returned and advised on.

## 2010-11-20 ENCOUNTER — Other Ambulatory Visit: Payer: Self-pay | Admitting: Internal Medicine

## 2010-11-20 DIAGNOSIS — E039 Hypothyroidism, unspecified: Secondary | ICD-10-CM

## 2010-11-20 DIAGNOSIS — E785 Hyperlipidemia, unspecified: Secondary | ICD-10-CM

## 2010-11-20 DIAGNOSIS — T887XXA Unspecified adverse effect of drug or medicament, initial encounter: Secondary | ICD-10-CM

## 2010-11-20 MED ORDER — AMBULATORY NON FORMULARY MEDICATION: Status: DC

## 2010-11-20 NOTE — Telephone Encounter (Signed)
Addended by: Edgardo Roys on: 11/20/2010 11:38 AM   Modules accepted: Orders

## 2010-11-20 NOTE — Telephone Encounter (Signed)
Recheck TSH now (244.9)

## 2010-11-20 NOTE — Telephone Encounter (Signed)
Spoke with patient, scheduled appointment for fasting labs for this Thursday

## 2010-11-22 ENCOUNTER — Other Ambulatory Visit (INDEPENDENT_AMBULATORY_CARE_PROVIDER_SITE_OTHER): Payer: Medicare Other

## 2010-11-22 DIAGNOSIS — E785 Hyperlipidemia, unspecified: Secondary | ICD-10-CM

## 2010-11-22 DIAGNOSIS — T887XXA Unspecified adverse effect of drug or medicament, initial encounter: Secondary | ICD-10-CM

## 2010-11-22 DIAGNOSIS — E039 Hypothyroidism, unspecified: Secondary | ICD-10-CM

## 2010-11-22 LAB — LIPID PANEL
Cholesterol: 190 mg/dL (ref 0–200)
HDL: 68.2 mg/dL (ref >39.00)
LDL Cholesterol: 100 mg/dL — ABNORMAL HIGH (ref 0–99)
Total CHOL/HDL Ratio: 3
Triglycerides: 107 mg/dL (ref 0.0–149.0)
VLDL: 21.4 mg/dL (ref 0.0–40.0)

## 2010-11-22 LAB — HEPATIC FUNCTION PANEL
Albumin: 4.1 g/dL (ref 3.5–5.2)
Alkaline Phosphatase: 58 U/L (ref 39–117)
Total Protein: 7.2 g/dL (ref 6.0–8.3)

## 2010-11-22 LAB — TSH: TSH: 5.1 u[IU]/mL (ref 0.35–5.50)

## 2010-11-22 NOTE — Progress Notes (Signed)
Labs only

## 2010-12-06 ENCOUNTER — Ambulatory Visit (INDEPENDENT_AMBULATORY_CARE_PROVIDER_SITE_OTHER): Payer: Medicare Other | Admitting: Internal Medicine

## 2010-12-06 ENCOUNTER — Encounter: Payer: Self-pay | Admitting: Internal Medicine

## 2010-12-06 DIAGNOSIS — E039 Hypothyroidism, unspecified: Secondary | ICD-10-CM

## 2010-12-06 DIAGNOSIS — E782 Mixed hyperlipidemia: Secondary | ICD-10-CM

## 2010-12-06 DIAGNOSIS — I10 Essential (primary) hypertension: Secondary | ICD-10-CM

## 2010-12-06 DIAGNOSIS — E559 Vitamin D deficiency, unspecified: Secondary | ICD-10-CM

## 2010-12-06 DIAGNOSIS — E119 Type 2 diabetes mellitus without complications: Secondary | ICD-10-CM

## 2010-12-06 MED ORDER — LEVOTHYROXINE SODIUM 112 MCG PO TABS: 112.0000 ug | ORAL_TABLET | Freq: Every day | ORAL | Status: DC

## 2010-12-06 MED ORDER — SIMVASTATIN 20 MG PO TABS: 20.0000 mg | ORAL_TABLET | Freq: Every evening | ORAL | Status: DC

## 2010-12-06 MED ORDER — CLONIDINE HCL 0.1 MG PO TABS: 0.1000 mg | ORAL_TABLET | Freq: Two times a day (BID) | ORAL | Status: DC

## 2010-12-06 NOTE — Progress Notes (Signed)
  Subjective:    Patient ID: Gloria Joseph, female    DOB: 26-Nov-1940, 70 y.o.   MRN: [id number]  HPI #1  HYPERTENSION: Disease Monitoring: Blood pressure range-152/55- 188/59  Chest pain, palpitations- no       Dyspnea- no Medications: Compliance- replacement for ACE-I not filled  Lightheadedness,Syncope- no    Edema- no   #2 DIABETES: Disease Monitoring: Blood Sugar ranges-not monitored  Polyuria/phagia/dipsia- no       Visual problems- no Medications: Compliance- no DM meds  Hypoglycemic symptoms- no  #3 HYPERLIPIDEMIA: Disease Monitoring: See symptoms for Hypertension Medications: Compliance- yes, Vytorin 10/20 mg 1/2 M, W, F , Sun;LDL 100, HDL 68.20  Abd pain, bowel changes- constipation   Muscle aches- no ROS See HPI above  PMH Smoking Status noted : never  #4 Thyroid function monitor  Medications status(change in dose/brand/mode of administration):no Constitutional: Weight change: up 5 #; Fatigue:yes; Sleep pattern:increased stress; Appetite:good  Hoarseness:no; Swallowing issues:no Derm: Change in nails/hair/skin:no Neuro: Numbness/tingling:no; Tremor:no Psych: Anxiety:yeswith stress; Depression:no; Panic attacks:no Endo: Temperature intolerance: Heat:no; Cold:yes TSH was 5.10 on July 5; on February 15 was 6.74.  #5 vitamin D deficiency :  The vitamin D level was 29 in February 2012. She is on 2000 international units of vitamin D 3 daily         Review of Systems     Objective:   Physical Exam Gen.:  well-nourished; in no acute distress Eyes: Extraocular motion intact; no lid lag or proptosis Neck:  No deformities, thyromegaly, masses, or tenderness noted.   Supple with full range of motion   Heart: Normal rhythm and rate without , gallop, or extra heart sounds. Grade 1/6 systolic murmur Lungs: Chest clear to auscultation without rales,rales, wheezes Neuro:Deep tendon reflexes are equal and within normal limits; no tremor  Skin: Warm and dry  without significant lesions or rashes; no onycholysis  All pulses intact without  bruits .No ischemic skin changes.   Psych: Normally communicative and interactive; no abnormal mood or affect clinically.         Assessment & Plan:  #1 hypertension, uncontrolled  #2 hypothyroidism,  TSH not at goal  #3 diabetes, diet controlled  #4 dyslipidemia,  Med & diet-controlled  #5 vitamin D deficiency questionable status  Plan: See orders

## 2010-12-06 NOTE — Patient Instructions (Signed)
Blood Pressure Goal  Ideally is an AVERAGE < 135/85. This AVERAGE should be calculated from @ least 5-7 BP readings taken @ different times of day on different days of week. You should not respond to isolated BP readings , but rather the AVERAGE for that week  Please  schedule fasting Labs in 10 weeks : BMET,Lipids, hepatic panel, TS, A1c

## 2010-12-11 ENCOUNTER — Other Ambulatory Visit: Payer: Self-pay | Admitting: Internal Medicine

## 2010-12-11 MED ORDER — GABAPENTIN 300 MG PO CAPS: 300.0000 mg | ORAL_CAPSULE | Freq: Every day | ORAL | Status: DC

## 2010-12-18 ENCOUNTER — Other Ambulatory Visit: Payer: Self-pay | Admitting: Internal Medicine

## 2010-12-18 MED ORDER — CARVEDILOL 25 MG PO TABS: 25.0000 mg | ORAL_TABLET | Freq: Two times a day (BID) | ORAL | Status: DC

## 2010-12-27 ENCOUNTER — Encounter: Payer: Self-pay | Admitting: Internal Medicine

## 2010-12-27 ENCOUNTER — Ambulatory Visit (INDEPENDENT_AMBULATORY_CARE_PROVIDER_SITE_OTHER): Payer: Medicare Other | Admitting: Internal Medicine

## 2010-12-27 DIAGNOSIS — I498 Other specified cardiac arrhythmias: Secondary | ICD-10-CM

## 2010-12-27 DIAGNOSIS — R42 Dizziness and giddiness: Secondary | ICD-10-CM

## 2010-12-27 DIAGNOSIS — I10 Essential (primary) hypertension: Secondary | ICD-10-CM

## 2010-12-27 DIAGNOSIS — R001 Bradycardia, unspecified: Secondary | ICD-10-CM

## 2010-12-27 MED ORDER — CARVEDILOL 25 MG PO TABS: 25.0000 mg | ORAL_TABLET | ORAL | Status: DC

## 2010-12-27 NOTE — Progress Notes (Signed)
  Subjective:    Patient ID: Gloria Joseph, female    DOB: 11-20-1940, 70 y.o.   MRN: [id number]  HPI  HYPERTENSION: Disease Monitoring  Blood pressure range: 116/46- 131/59 on average  Chest pain: no   Dyspnea: no   Claudication: no             Epistaxis :no             Headaches: minor pressure diffusely Medication compliance: yes  Medication Side Effects  Lightheadedness: yes, starting 1 hr after taking meds & lasting 3-4 hrs, NOT positional   Urinary frequency: no   Edema: yes, LLE > RLE  By end of day   Preventitive Healthcare:  Exercise: no, due to lightheadedness   Diet Pattern: W W 's  Salt Restriction: yes  Her blood pressure was not concordant with our readings. It read 160/53, much higher than values recorded with our cuff.      Review of Systems with the dizzy spells she has some blurring of vision. She has no diplopia or vision loss. She also denies hearing loss, tinnitus, or frank vertigo. She's not having benign positional vertigo symptoms. No hypoglycemia     Objective:   Physical Exam  Gen. appearance: Well-nourished, in no distress Eyes: Extraocular motion intact, field of vision WNL; no nystagmus but slight dizziness with EOM. Fundi benign n normal, vision grossly intact, no nystagmus ENT: Canals clear, tympanic membranes normal, hearing grossly normal Neck: Normal range of motion, no masses, normal thyroid Cardiovascular: SLOW rate and regular rhythm normal; no murmurs, gallops or extra heart sounds Muscle skeletal: Range of motion, tone, &  strength normal Neuro:no cranial nerve deficit, deep tendon  reflexes normal, gait normal. Romberg & finger to nose WNL Lymph: No cervical or axillary LA Skin: Warm and dry without suspicious lesions or rashes Psych: no anxiety or mood change. Normally interactive and cooperative.        Assessment & Plan:  #1 hypertension, well controlled, possibly over controlled. Home recordings are excellent but may be  actually lower based on comparison of her cuff to ours  #2 dizziness, temporary related to ingestion of her meds, no postural component.  #3 bradycardia Plan: see Orders

## 2010-12-27 NOTE — Patient Instructions (Signed)
Decrease carvedilol 25 mg to one half pill twice a day. Monitor blood pressure and symptoms after this change.Blood Pressure Goal  Ideally is an AVERAGE < 135/85. This AVERAGE should be calculated from @ least 5-7 BP readings taken @ different times of day on different days of week. You should not respond to isolated BP readings , but rather the AVERAGE for that week

## 2011-01-02 ENCOUNTER — Other Ambulatory Visit: Payer: Self-pay

## 2011-01-02 DIAGNOSIS — E039 Hypothyroidism, unspecified: Secondary | ICD-10-CM

## 2011-01-02 MED ORDER — LEVOTHYROXINE SODIUM 112 MCG PO TABS: 112.0000 ug | ORAL_TABLET | Freq: Every day | ORAL | Status: DC

## 2011-01-02 NOTE — Telephone Encounter (Signed)
RX's sent to pharmacies

## 2011-01-02 NOTE — Telephone Encounter (Signed)
Message left on voicemail: Patient would like a 30 day supply for [facility], 3 month supply [facility]

## 2011-02-02 ENCOUNTER — Emergency Department (HOSPITAL_COMMUNITY)
Admission: EM | Admit: 2011-02-02 | Discharge: 2011-02-02 | Disposition: A | Discharge status: Home / Self Care | Payer: Medicare Other | Attending: Emergency Medicine | Admitting: Emergency Medicine

## 2011-02-02 DIAGNOSIS — E039 Hypothyroidism, unspecified: Secondary | ICD-10-CM | POA: Insufficient documentation

## 2011-02-02 DIAGNOSIS — Z79899 Other long term (current) drug therapy: Secondary | ICD-10-CM | POA: Insufficient documentation

## 2011-02-02 DIAGNOSIS — I10 Essential (primary) hypertension: Secondary | ICD-10-CM | POA: Insufficient documentation

## 2011-02-02 DIAGNOSIS — I491 Atrial premature depolarization: Secondary | ICD-10-CM | POA: Insufficient documentation

## 2011-02-02 DIAGNOSIS — N390 Urinary tract infection, site not specified: Secondary | ICD-10-CM | POA: Insufficient documentation

## 2011-02-02 DIAGNOSIS — E119 Type 2 diabetes mellitus without complications: Secondary | ICD-10-CM | POA: Insufficient documentation

## 2011-02-02 DIAGNOSIS — R55 Syncope and collapse: Secondary | ICD-10-CM | POA: Insufficient documentation

## 2011-02-02 DIAGNOSIS — Z853 Personal history of malignant neoplasm of breast: Secondary | ICD-10-CM | POA: Insufficient documentation

## 2011-02-02 LAB — POCT I-STAT TROPONIN I
Troponin i, poc: 0.01 ng/mL (ref 0.00–0.08)
Troponin i, poc: 0.03 ng/mL (ref 0.00–0.08)

## 2011-02-02 LAB — URINE MICROSCOPIC-ADD ON

## 2011-02-02 LAB — COMPREHENSIVE METABOLIC PANEL
AST: 18 U/L (ref 0–37)
Albumin: 3.5 g/dL (ref 3.5–5.2)
Alkaline Phosphatase: 63 U/L (ref 39–117)
BUN: 21 mg/dL (ref 6–23)
Potassium: 3.7 mEq/L (ref 3.5–5.1)
Total Protein: 6.9 g/dL (ref 6.0–8.3)

## 2011-02-02 LAB — URINALYSIS, ROUTINE W REFLEX MICROSCOPIC
Bilirubin Urine: NEGATIVE
Glucose, UA: NEGATIVE mg/dL
Specific Gravity, Urine: 1.01 (ref 1.005–1.030)

## 2011-02-02 LAB — DIFFERENTIAL
Basophils Absolute: 0 10*3/uL (ref 0.0–0.1)
Basophils Relative: 1 % (ref 0–1)
Eosinophils Absolute: 0.3 10*3/uL (ref 0.0–0.7)
Eosinophils Relative: 5 % (ref 0–5)
Lymphocytes Relative: 25 % (ref 12–46)
Monocytes Absolute: 0.8 10*3/uL (ref 0.1–1.0)

## 2011-02-02 LAB — CBC
HCT: 32.4 % — ABNORMAL LOW (ref 36.0–46.0)
MCHC: 34.9 g/dL (ref 30.0–36.0)
Platelets: 214 10*3/uL (ref 150–400)
RDW: 12.3 % (ref 11.5–15.5)

## 2011-02-05 ENCOUNTER — Ambulatory Visit (INDEPENDENT_AMBULATORY_CARE_PROVIDER_SITE_OTHER): Payer: Medicare Other | Admitting: Internal Medicine

## 2011-02-05 ENCOUNTER — Encounter: Payer: Self-pay | Admitting: Internal Medicine

## 2011-02-05 DIAGNOSIS — E871 Hypo-osmolality and hyponatremia: Secondary | ICD-10-CM

## 2011-02-05 DIAGNOSIS — G629 Polyneuropathy, unspecified: Secondary | ICD-10-CM

## 2011-02-05 DIAGNOSIS — R42 Dizziness and giddiness: Secondary | ICD-10-CM

## 2011-02-05 DIAGNOSIS — I10 Essential (primary) hypertension: Secondary | ICD-10-CM

## 2011-02-05 DIAGNOSIS — D649 Anemia, unspecified: Secondary | ICD-10-CM

## 2011-02-05 DIAGNOSIS — E039 Hypothyroidism, unspecified: Secondary | ICD-10-CM

## 2011-02-05 DIAGNOSIS — G609 Hereditary and idiopathic neuropathy, unspecified: Secondary | ICD-10-CM

## 2011-02-05 DIAGNOSIS — E119 Type 2 diabetes mellitus without complications: Secondary | ICD-10-CM

## 2011-02-05 LAB — URINE CULTURE

## 2011-02-05 NOTE — Progress Notes (Signed)
  Subjective:    Patient ID: Gloria Joseph, female    DOB: [birth year], 70 y.o.   MRN: [id number]  HPI ER 9/14/ 2012 records reviewed; intermittent dizziness since. UTI documented; > 100,000 E coli sensitive Keflex . She was asymptomatic.Sodium 130; she is on HCTZ. PMH of HYPOKALEMIA with Spironolactone. Thyroid increased after TSH of 5.10 on 7/5. BP range : 122/42- 180/68 (9/16). Average 135/47- 163/51     Review of Systems Dizziness Onset:with last OV with med changes Context: Position change:no Benign positional vertigo symptoms:no Straining:no Pain:no Cardiac prodrome: no palpitations, irregular rhythm, heart rate change Neurologic prodrome: no headache, numbness and tingling, weakness, change in coordination (gait/falling) Syncope:no Seizure activity:no Upper respiratory tract infection/extrinsic symptoms:no Duration:few seconds Frequency:increased since ER, up to 6/day 9/14 eve, now 2-3 Associated signs and symptoms: Visual change (blurred/double/loss):no Hearing loss/tinnitus:no Nausea/sweating:no Chest pain:no Dyspnea:no Treatment/response: meditation      Objective:   Physical Exam  Gen. appearance: Well-nourished, in no distress Eyes: Extraocular motion intact, field of vision normal, vision grossly intact, no nystagmus ENT: Canals clear, tympanic membranes normal, tuning fork exam normal, hearing grossly normal Neck: Normal range of motion, no masses, normal thyroid Cardiovascular: Rate slow; rhythm normal; no murmurs, gallops or extra heart sounds Muscle skeletal:  tone, &  strength normal Neuro:no cranial nerve deficit, deep tendon  reflexes normal, gait normal; Romberg & finger to nose normal Lymph: No cervical or axillary LA Skin: Warm and dry without suspicious lesions or rashes Psych: no anxiety or mood change. Normally interactive and cooperative.         Assessment & Plan:  #1 dizziness #2 UTI #3HTN #$ hyponatremia Plan: see Orders

## 2011-02-05 NOTE — Patient Instructions (Signed)
Your BP goal = AVERAGE < 135/85. Avoid ingestion of  excess salt/sodium.Cook with pepper & other spices . Use the salt substitute "No Salt"(unless your potassium has been elevated) OR the Mrs Dash products to season food @ the table. Avoid foods which taste salty or "vinegary" as their sodium contentet will be high.  

## 2011-02-06 LAB — BASIC METABOLIC PANEL
BUN: 16 mg/dL (ref 6–23)
CO2: 28 mEq/L (ref 19–32)
Calcium: 9.5 mg/dL (ref 8.4–10.5)
Creatinine, Ser: 0.8 mg/dL (ref 0.4–1.2)
Glucose, Bld: 92 mg/dL (ref 70–99)
Sodium: 132 mEq/L — ABNORMAL LOW (ref 135–145)

## 2011-02-06 LAB — CBC WITH DIFFERENTIAL/PLATELET
Basophils Relative: 0.1 % (ref 0.0–3.0)
Eosinophils Relative: 4.3 % (ref 0.0–5.0)
HCT: 33.7 % — ABNORMAL LOW (ref 36.0–46.0)
Hemoglobin: 11.6 g/dL — ABNORMAL LOW (ref 12.0–15.0)
Lymphs Abs: 1.4 10*3/uL (ref 0.7–4.0)
Monocytes Relative: 12.8 % — ABNORMAL HIGH (ref 3.0–12.0)
Neutro Abs: 2.3 10*3/uL (ref 1.4–7.7)
RBC: 3.72 Mil/uL — ABNORMAL LOW (ref 3.87–5.11)
WBC: 4.5 10*3/uL (ref 4.5–10.5)

## 2011-02-06 LAB — IBC PANEL: Saturation Ratios: 16.9 % — ABNORMAL LOW (ref 20.0–50.0)

## 2011-02-13 ENCOUNTER — Encounter: Payer: Self-pay | Admitting: Internal Medicine

## 2011-02-14 ENCOUNTER — Ambulatory Visit (INDEPENDENT_AMBULATORY_CARE_PROVIDER_SITE_OTHER): Payer: Medicare Other | Admitting: Internal Medicine

## 2011-02-14 ENCOUNTER — Encounter: Payer: Self-pay | Admitting: Internal Medicine

## 2011-02-14 DIAGNOSIS — D649 Anemia, unspecified: Secondary | ICD-10-CM

## 2011-02-14 DIAGNOSIS — E039 Hypothyroidism, unspecified: Secondary | ICD-10-CM

## 2011-02-14 DIAGNOSIS — I10 Essential (primary) hypertension: Secondary | ICD-10-CM

## 2011-02-14 DIAGNOSIS — E871 Hypo-osmolality and hyponatremia: Secondary | ICD-10-CM

## 2011-02-14 MED ORDER — CLONIDINE HCL 0.2 MG PO TABS: 0.2000 mg | ORAL_TABLET | Freq: Two times a day (BID) | ORAL | Status: DC

## 2011-02-14 MED ORDER — LEVOTHYROXINE SODIUM 112 MCG PO TABS: 112.0000 ug | ORAL_TABLET | ORAL | Status: DC

## 2011-02-14 NOTE — Progress Notes (Signed)
  Subjective:    Patient ID: Gloria Joseph, female    DOB: 10-Jul-1940, 70 y.o.   MRN: [id number]  HPI   Gloria Joseph has not had syncope since her last appointment. The dizziness has improved.  Labs were reviewed. Her TSH is now 0.03, indicating excess thyroid replacement. She has a mild stable anemia which actually appear slightly improved. It is normochromic, normocytic. White count, differential, and platelet count are all normal  Sodium remains low at 132 but appears to be increasing off HCTZ. All fat her thiazide her blood pressures ranged from 114/53-178/61.    Review of Systems     Objective:   Physical Exam  Gen.:  well-nourished; in no acute distress Eyes: Extraocular motion intact; no lid lag or proptosis Neck: thyroid normal Heart: Normal rhythm ; slow rate without significant  gallop, or extra heart sounds. Grade 1/6 systolic murmur with neck vein radiation Lungs: Chest clear to auscultation without rales,rales, wheezes Neuro:Deep tendon reflexes are equal and within normal limits; no tremor  Skin: Warm and dry without significant lesions or rashes; no onycholysis Psych: Normally communicative and interactive; no abnormal mood or affect clinically.         Assessment & Plan:  #1 hypothyroidism, excessive replacement  #2 hypertension suboptimal control. Problematic is her intolerance to amlodipine and frank allergy to ACE inhibitors. This precludes the use of any ARB's. By history she's had hypokalemia was brought to which would be unusual. She appears to have hyponatremia on HCTZ. She is on a beta blocker, carvedilol; her slow heart rate limits use of other calcium channel blockers and beta blockers. The most reasonable course would be to increase the clonidine. If additional blood pressure control as needed, re challenging with spironolactone could be considered.  #3 syncope and imbalance, probably related to hyponatremia which is improving.  #4 anemia, normocytic  normochromic. B12 and iron levels are normal. This should simply be monitored.

## 2011-02-14 NOTE — Patient Instructions (Addendum)
Finish your present supply of clonidine 0.1 mg by taking 2 pills twice a day. Then fill the new prescription which has been sent to [facility]. Change thyroid to 112 mcg daily except half a pill on Wednesday. Blood Pressure Goal  Ideally is an AVERAGE < 135/85. This AVERAGE should be calculated from @ least 5-7 BP readings taken @ different times of day on different days of week. You should not respond to isolated BP readings , but rather the AVERAGE for that week   Please  schedule fasting Labs in 10 weeks : BMET, CBC & dif, TSH.  Please bring these instructions to that Lab appt.

## 2011-02-21 ENCOUNTER — Telehealth: Payer: Self-pay

## 2011-02-21 NOTE — Telephone Encounter (Signed)
Message copied by Edgardo Roys on Thu Feb 21, 2011 11:25 AM ------      Message from: Pecola Lawless      Created: Sat Feb 09, 2011  7:29 AM       Anemia is present but it has improved.Iron panel &  B12  Levels are normal.Mild elevation of Monocytes is not significant.       Hyponatremia or low sodium is improving.      TSH (Thyroid Stimulating Hormone) normal range = 0.35- 5.50. Ideal value is 1-3. A  Value below 0.35 indicates excessive thyroid supplementation (HYPERthyroid state) & a Value > 5.50 indicates inadequate replacement.(HYPOthyroid state) Either extreme can have adverse long  term effects.Please  DECREASE the thyroid medication from 112 mcg daily to 112 mcg daily EXCEPT 1/2 on Weds.Please  schedule fasting Labs in 8 weeks : BMET, CBC & dif, TSH.  Please bring these instructions to that Lab appt.  [employer]

## 2011-02-21 NOTE — Telephone Encounter (Signed)
Patient was seen in office to discuss labs

## 2011-03-14 ENCOUNTER — Other Ambulatory Visit (INDEPENDENT_AMBULATORY_CARE_PROVIDER_SITE_OTHER): Payer: Medicare Other

## 2011-03-14 DIAGNOSIS — Z1211 Encounter for screening for malignant neoplasm of colon: Secondary | ICD-10-CM

## 2011-03-14 LAB — HEMOCCULT GUIAC POC 1CARD (OFFICE)
Card #3 Fecal Occult Blood, POC: NEGATIVE
Fecal Occult Blood, POC: NEGATIVE

## 2011-03-14 NOTE — Progress Notes (Signed)
Labs only

## 2011-03-19 ENCOUNTER — Telehealth: Payer: Self-pay | Admitting: Internal Medicine

## 2011-03-19 NOTE — Telephone Encounter (Signed)
Pt scheduled for OV.  

## 2011-03-20 ENCOUNTER — Ambulatory Visit (INDEPENDENT_AMBULATORY_CARE_PROVIDER_SITE_OTHER): Payer: Medicare Other | Admitting: Internal Medicine

## 2011-03-20 ENCOUNTER — Encounter: Payer: Self-pay | Admitting: Internal Medicine

## 2011-03-20 DIAGNOSIS — G629 Polyneuropathy, unspecified: Secondary | ICD-10-CM

## 2011-03-20 DIAGNOSIS — M949 Disorder of cartilage, unspecified: Secondary | ICD-10-CM

## 2011-03-20 DIAGNOSIS — E871 Hypo-osmolality and hyponatremia: Secondary | ICD-10-CM

## 2011-03-20 DIAGNOSIS — G609 Hereditary and idiopathic neuropathy, unspecified: Secondary | ICD-10-CM

## 2011-03-20 DIAGNOSIS — R5381 Other malaise: Secondary | ICD-10-CM

## 2011-03-20 DIAGNOSIS — I10 Essential (primary) hypertension: Secondary | ICD-10-CM

## 2011-03-20 DIAGNOSIS — E039 Hypothyroidism, unspecified: Secondary | ICD-10-CM

## 2011-03-20 DIAGNOSIS — M899 Disorder of bone, unspecified: Secondary | ICD-10-CM

## 2011-03-20 DIAGNOSIS — E119 Type 2 diabetes mellitus without complications: Secondary | ICD-10-CM

## 2011-03-20 DIAGNOSIS — R5383 Other fatigue: Secondary | ICD-10-CM

## 2011-03-20 DIAGNOSIS — D649 Anemia, unspecified: Secondary | ICD-10-CM

## 2011-03-20 DIAGNOSIS — E559 Vitamin D deficiency, unspecified: Secondary | ICD-10-CM

## 2011-03-20 LAB — BASIC METABOLIC PANEL
BUN: 16 mg/dL (ref 6–23)
CO2: 26 mEq/L (ref 19–32)
Chloride: 106 mEq/L (ref 96–112)
Creatinine, Ser: 0.9 mg/dL (ref 0.4–1.2)
GFR: 64.79 mL/min (ref >60.00)

## 2011-03-20 LAB — IBC PANEL
Iron: 61 ug/dL (ref 42–145)
Saturation Ratios: 19 % — ABNORMAL LOW (ref 20.0–50.0)
Transferrin: 229.3 mg/dL (ref 212.0–360.0)

## 2011-03-20 LAB — CBC WITH DIFFERENTIAL/PLATELET
Basophils Relative: 0.7 % (ref 0.0–3.0)
Eosinophils Relative: 3.4 % (ref 0.0–5.0)
Hemoglobin: 11.1 g/dL — ABNORMAL LOW (ref 12.0–15.0)
Lymphocytes Relative: 20.7 % (ref 12.0–46.0)
MCHC: 34 g/dL (ref 30.0–36.0)
Monocytes Relative: 6.9 % (ref 3.0–12.0)
Neutro Abs: 3 10*3/uL (ref 1.4–7.7)
Neutrophils Relative %: 68.3 % (ref 43.0–77.0)
RBC: 3.62 Mil/uL — ABNORMAL LOW (ref 3.87–5.11)
WBC: 4.4 10*3/uL — ABNORMAL LOW (ref 4.5–10.5)

## 2011-03-20 LAB — VITAMIN B12: Vitamin B-12: 324 pg/mL (ref 211–911)

## 2011-03-20 NOTE — Patient Instructions (Signed)
Blood Pressure Goal  Ideally is an AVERAGE < 135/85. This AVERAGE should be calculated from @ least 5-7 BP readings taken @ different times of day on different days of week. You should not respond to isolated BP readings , but rather the AVERAGE for that week Bring BP cuff to all appts

## 2011-03-20 NOTE — Progress Notes (Signed)
Subjective:    Patient ID: Gloria Joseph, female    DOB: August 25, 1940, 70 y.o.   MRN: [id number]  HPI FATIGUE Onset: intermittent for 8 months; worsening  Gradually. ? Exacerbation for 1.5-2 hrs after taking Carvedilol    Fatigue @ rest : yes    Primarily physical fatigue: yes Symptoms: Fever/ chills : no  Night sweats: no                                                                                            Vision changes ( blurred/ double/ loss): no                                                                                                   Hoarseness or swallowing dysfunction: no                                                                                         Bowel changes( constipation/ diarrhea): constipation "forever"                                                                                      Weight change: yes, down 7 #   Exertional chest pain: no   Dyspnea on exertion: no  Cough: no  Hemoptysis: no  New medications: yes, changes in BP meds & thyroid dose  Leg swelling: no  Orthopnea: no  PND: no  Melena/ rectal bleeding: no  Adenopathy: no  Severe snoring: no  Daytime sleepiness: no Skin / hair / nail changes: no  Temperature intolerance( heat/ cold) : always cold  Feeling depressed: no  Anhedonia: no  Altered appetite: no  Poor sleep/ Apnea : yes, sleeping poorly, waking up early  Abnormal bruising / bleeding or enlarged lymph nodes: no                                                                           Review of Systems BP average 134-137/44-45.  She describes polydipsia; she drinks a gallon of water a day. With this she has polyuria. She denies polyphagia. FBS not monitored     Objective:   Physical Exam  Gen.:  well-nourished; in no acute distress Eyes: Extraocular motion intact; no lid lag or proptosis Heart: Normal rhythm and  rate without significant murmur, gallop, S 4 w/o  extra heart sounds Neck: full ROM ; thyroid normal Lungs: Chest clear to auscultation without rales,rales, wheezes Neuro:Deep tendon reflexes are equal and within normal limits; no tremor  Skin: Warm and dry without significant lesions or rashes; no onycholysis Psych: Normally communicative and interactive; no abnormal mood or affect clinically.         Assessment & Plan:  #1 fatigue, exacerbation with recent change in medicines  #2 hypertension; discrepancy between home readings and office recordings. Direct comparison with her cough is mandatory  #3 diabetes; no recent monitor  Plan: See orders and recommendations.

## 2011-03-21 LAB — VITAMIN D 25 HYDROXY (VIT D DEFICIENCY, FRACTURES): Vit D, 25-Hydroxy: 29 ng/mL — ABNORMAL LOW (ref 30–89)

## 2011-04-10 ENCOUNTER — Emergency Department (HOSPITAL_COMMUNITY): Payer: Medicare Other

## 2011-04-10 ENCOUNTER — Other Ambulatory Visit: Payer: Self-pay

## 2011-04-10 ENCOUNTER — Inpatient Hospital Stay (HOSPITAL_COMMUNITY)
Admission: EM | Admit: 2011-04-10 | Discharge: 2011-04-15 | DRG: 312 | Disposition: A | Discharge status: Home Health | Payer: Medicare Other | Source: Ambulatory Visit | Attending: Internal Medicine | Admitting: Internal Medicine

## 2011-04-10 ENCOUNTER — Encounter (HOSPITAL_COMMUNITY): Payer: Self-pay | Admitting: Emergency Medicine

## 2011-04-10 DIAGNOSIS — M6530 Trigger finger, unspecified finger: Secondary | ICD-10-CM

## 2011-04-10 DIAGNOSIS — M899 Disorder of bone, unspecified: Secondary | ICD-10-CM

## 2011-04-10 DIAGNOSIS — E890 Postprocedural hypothyroidism: Secondary | ICD-10-CM | POA: Diagnosis present

## 2011-04-10 DIAGNOSIS — Z803 Family history of malignant neoplasm of breast: Secondary | ICD-10-CM

## 2011-04-10 DIAGNOSIS — R609 Edema, unspecified: Secondary | ICD-10-CM

## 2011-04-10 DIAGNOSIS — I498 Other specified cardiac arrhythmias: Secondary | ICD-10-CM | POA: Diagnosis present

## 2011-04-10 DIAGNOSIS — R5383 Other fatigue: Secondary | ICD-10-CM

## 2011-04-10 DIAGNOSIS — N3941 Urge incontinence: Secondary | ICD-10-CM | POA: Diagnosis present

## 2011-04-10 DIAGNOSIS — Z800 Family history of malignant neoplasm of digestive organs: Secondary | ICD-10-CM

## 2011-04-10 DIAGNOSIS — Z888 Allergy status to other drugs, medicaments and biological substances status: Secondary | ICD-10-CM

## 2011-04-10 DIAGNOSIS — E1129 Type 2 diabetes mellitus with other diabetic kidney complication: Secondary | ICD-10-CM | POA: Diagnosis present

## 2011-04-10 DIAGNOSIS — Z79899 Other long term (current) drug therapy: Secondary | ICD-10-CM

## 2011-04-10 DIAGNOSIS — I4891 Unspecified atrial fibrillation: Secondary | ICD-10-CM

## 2011-04-10 DIAGNOSIS — E559 Vitamin D deficiency, unspecified: Secondary | ICD-10-CM

## 2011-04-10 DIAGNOSIS — Z833 Family history of diabetes mellitus: Secondary | ICD-10-CM

## 2011-04-10 DIAGNOSIS — R9431 Abnormal electrocardiogram [ECG] [EKG]: Secondary | ICD-10-CM | POA: Diagnosis present

## 2011-04-10 DIAGNOSIS — Z853 Personal history of malignant neoplasm of breast: Secondary | ICD-10-CM

## 2011-04-10 DIAGNOSIS — E785 Hyperlipidemia, unspecified: Secondary | ICD-10-CM | POA: Diagnosis present

## 2011-04-10 DIAGNOSIS — E119 Type 2 diabetes mellitus without complications: Secondary | ICD-10-CM

## 2011-04-10 DIAGNOSIS — R001 Bradycardia, unspecified: Secondary | ICD-10-CM

## 2011-04-10 DIAGNOSIS — R5381 Other malaise: Secondary | ICD-10-CM

## 2011-04-10 DIAGNOSIS — I440 Atrioventricular block, first degree: Secondary | ICD-10-CM | POA: Diagnosis present

## 2011-04-10 DIAGNOSIS — E039 Hypothyroidism, unspecified: Secondary | ICD-10-CM

## 2011-04-10 DIAGNOSIS — Z6379 Other stressful life events affecting family and household: Secondary | ICD-10-CM

## 2011-04-10 DIAGNOSIS — Z9010 Acquired absence of unspecified breast and nipple: Secondary | ICD-10-CM

## 2011-04-10 DIAGNOSIS — I10 Essential (primary) hypertension: Secondary | ICD-10-CM | POA: Diagnosis present

## 2011-04-10 DIAGNOSIS — E782 Mixed hyperlipidemia: Secondary | ICD-10-CM

## 2011-04-10 DIAGNOSIS — Z880 Allergy status to penicillin: Secondary | ICD-10-CM

## 2011-04-10 DIAGNOSIS — R55 Syncope and collapse: Principal | ICD-10-CM | POA: Diagnosis present

## 2011-04-10 HISTORY — DX: Unspecified atrial fibrillation: I48.91

## 2011-04-10 HISTORY — DX: Myoneural disorder, unspecified: G70.9

## 2011-04-10 HISTORY — DX: Hypothyroidism, unspecified: E03.9

## 2011-04-10 HISTORY — DX: Malignant (primary) neoplasm, unspecified: C80.1

## 2011-04-10 LAB — URINALYSIS, ROUTINE W REFLEX MICROSCOPIC
Bilirubin Urine: NEGATIVE
Hgb urine dipstick: NEGATIVE
Specific Gravity, Urine: 1.006 (ref 1.005–1.030)
pH: 7 (ref 5.0–8.0)

## 2011-04-10 LAB — COMPREHENSIVE METABOLIC PANEL
ALT: 13 U/L (ref 0–35)
AST: 19 U/L (ref 0–37)
Albumin: 3.5 g/dL (ref 3.5–5.2)
Chloride: 101 mEq/L (ref 96–112)
Creatinine, Ser: 0.88 mg/dL (ref 0.50–1.10)
Sodium: 135 mEq/L (ref 135–145)
Total Bilirubin: 0.3 mg/dL (ref 0.3–1.2)

## 2011-04-10 LAB — CARDIAC PANEL(CRET KIN+CKTOT+MB+TROPI)
CK, MB: 2.5 ng/mL (ref 0.3–4.0)
Troponin I: <0.3 ng/mL (ref <0.30)

## 2011-04-10 LAB — CBC
HCT: 32.5 % — ABNORMAL LOW (ref 36.0–46.0)
MCHC: 35.1 g/dL (ref 30.0–36.0)
Platelets: 200 10*3/uL (ref 150–400)
RDW: 13.1 % (ref 11.5–15.5)
WBC: 5.6 10*3/uL (ref 4.0–10.5)

## 2011-04-10 LAB — DIFFERENTIAL
Basophils Absolute: 0.1 10*3/uL (ref 0.0–0.1)
Basophils Relative: 1 % (ref 0–1)
Lymphocytes Relative: 24 % (ref 12–46)
Monocytes Absolute: 0.7 10*3/uL (ref 0.1–1.0)
Neutro Abs: 3.2 10*3/uL (ref 1.7–7.7)
Neutrophils Relative %: 57 % (ref 43–77)

## 2011-04-10 LAB — PROTIME-INR: Prothrombin Time: 14.4 seconds (ref 11.6–15.2)

## 2011-04-10 MED ORDER — CARVEDILOL 12.5 MG PO TABS
12.5000 mg | ORAL_TABLET | ORAL | Status: DC
  Filled 2011-04-10: qty 1

## 2011-04-10 MED ORDER — CLONIDINE HCL 0.1 MG PO TABS
0.2000 mg | ORAL_TABLET | Freq: Once | ORAL | Status: AC
  Administered 2011-04-10: 0.2 mg via ORAL
  Filled 2011-04-10: qty 2

## 2011-04-10 NOTE — ED Notes (Signed)
Pt states she has "blacked out" sitting up in chair x 3 since 710pm tonight.  States her BP is too high or too low.  Has hx of same about a month ago.  Has had BP meds adjusted recently.  C/o LT arm feeling "weird" since the onset of the "blackout" symptoms.

## 2011-04-10 NOTE — ED Provider Notes (Signed)
I saw and evaluated the patient, reviewed the resident's note and I agree with the findings and plan.   .Face to face Exam:  General:  Awake HEENT:  Atraumatic Resp:  Normal effort Abd:  Nondistended Neuro:No focal weakness Lymph: No adenopathy   Nelia Shi, MD 04/10/11 2329

## 2011-04-10 NOTE — ED Provider Notes (Addendum)
History     CSN: [id number] Arrival date & time: 04/10/2011  9:14 PM   First MD Initiated Contact with Patient 04/10/11 2203      Chief Complaint  Patient presents with  . Near Syncope    (Consider location/radiation/quality/duration/timing/severity/associated sxs/prior treatment) HPI  This is a 70 year old female with PMH of HTN, HLD, DM II, hypothyroidism and s/p thyroid gland ablation who presents to the ED with near syncope. The history is provided by patient. Pt reports 3 episodes of sudden fainting and "Blackouts" within 20 minutes tonight while she was resting.  Each episode last less than 1 minute and resolved on its own, accompanied with palpitation, feeling fainting and hot, general tinglings and weakness. No LOC, urinary or bowel incontinence. No headache, fever, or sore throat. No shortness of breath or dyspnea on exertion. No chest pain, chest pressure or palpitation.  No nausea, vomiting, or abdominal pain. No melena, diarrhea or incontinence. No muscle weakness.                    Pt also states that she has had high blood pressure issues since March of 2012 where she would have very high blood pressure not well controlled by antihypertensive medications. She also developed various allergic reactions to her antihypertensive medications. Her thyroid gland function has been running ups and downs as well since March of 2012.  Of note, she had 4 episodes of similar "Blackouts" in September and her PCP aware of it.  Past Medical History  Diagnosis Date  . Diabetes mellitus, type 2   . Hypertension   . Osteopenia   . Disorder of thyroid   . Hyperlipidemia     Past Surgical History  Procedure Date  . Breast enhancement surgery   . Breast implant removal 2000    Unilaterally   . Cholecystectomy   . Colonoscopy 2002    Neg  . Rai 04/04/2006  . Mastectomy     Family History  Problem Relation Age of Onset  . Depression Mother   . Alcohol abuse Father   . Heart  attack Brother 65  . Cancer Other     Uncle, not specific  . Cancer Other     Aunt, not specific. GYN CA  . Colon cancer Other     Grandfather-not specific  . Breast cancer Other     Aunt, not specific  . Diabetes Other     Grandmother, not specific    History  Substance Use Topics  . Smoking status: Never Smoker   . Smokeless tobacco: Not on file  . Alcohol Use: No    OB History    Grav Para Term Preterm Abortions TAB SAB Ect Mult Living                  Review of Systems .see HPI  Allergies  Lisinopril; Hctz; Spironolactone; and Amlodipine besylate  Home Medications   Current Outpatient Rx  Name Route Sig Dispense Refill  . CARVEDILOL 25 MG PO TABS Oral Take 12.5 mg by mouth 2 (two) times daily.      Marland Kitchen CLONIDINE HCL 0.2 MG PO TABS Oral Take 0.2 mg by mouth 2 (two) times daily.      Marland Kitchen GABAPENTIN 300 MG PO CAPS Oral Take 300 mg by mouth at bedtime.      Marland Kitchen LEVOTHYROXINE SODIUM 112 MCG PO TABS Oral Take 112 mcg by mouth daily. 1/2 tablet on Tuesday and Thursday. All other days pt  takes 1 tablet     . SIMVASTATIN 20 MG PO TABS Oral Take 20 mg by mouth at bedtime.        BP 199/54  Pulse 52  Temp(Src) 98.7 F (37.1 C) (Oral)  Resp 18  SpO2 99%  Physical Exam General: alert, well-developed, and cooperative to examination.  Head: normocephalic and atraumatic.  Eyes: vision grossly intact, pupils equal, pupils round, pupils reactive to light, no injection and anicteric.  Mouth: pharynx pink and moist, no erythema, and no exudates.  Neck: supple, full ROM, no thyromegaly, no JVD, and no carotid bruits.  Lungs: normal respiratory effort, no accessory muscle use, normal breath sounds, no crackles, and no wheezes. Heart: normal rate, regular rhythm, no murmur, no gallop, and no rub.  Abdomen: soft, non-tender, normal bowel sounds, no distention, no guarding, no rebound tenderness, no hepatomegaly, and no splenomegaly.  Msk: no joint swelling, no joint warmth, and no  redness over joints.  Pulses: 2+ DP/PT pulses bilaterally Extremities: No cyanosis, clubbing, edema Neurologic: alert & oriented X3, cranial nerves II-XII intact, strength normal in all extremities, sensation intact to light touch, and gait normal.  Skin: turgor normal and no rashes.  Psych: Oriented X3, memory intact for recent and remote, normally interactive, good eye contact, not anxious appearing, and not depressed appearing.   ED Course  Procedures (including critical care time) Results for orders placed during the hospital encounter of 04/10/11  CBC      Component Value Range   WBC 5.6  4.0 - 10.5 (K/uL)   RBC 3.73 (*) 3.87 - 5.11 (MIL/uL)   Hemoglobin 11.4 (*) 12.0 - 15.0 (g/dL)   HCT 91.4 (*) 78.2 - 46.0 (%)   MCV 87.1  78.0 - 100.0 (fL)   MCH 30.6  26.0 - 34.0 (pg)   MCHC 35.1  30.0 - 36.0 (g/dL)   RDW 95.6  21.3 - 08.6 (%)   Platelets 200  150 - 400 (K/uL)  DIFFERENTIAL      Component Value Range   Neutrophils Relative 57  43 - 77 (%)   Neutro Abs 3.2  1.7 - 7.7 (K/uL)   Lymphocytes Relative 24  12 - 46 (%)   Lymphs Abs 1.4  0.7 - 4.0 (K/uL)   Monocytes Relative 12  3 - 12 (%)   Monocytes Absolute 0.7  0.1 - 1.0 (K/uL)   Eosinophils Relative 6 (*) 0 - 5 (%)   Eosinophils Absolute 0.3  0.0 - 0.7 (K/uL)   Basophils Relative 1  0 - 1 (%)   Basophils Absolute 0.1  0.0 - 0.1 (K/uL)  COMPREHENSIVE METABOLIC PANEL      Component Value Range   Sodium 135  135 - 145 (mEq/L)   Potassium 3.7  3.5 - 5.1 (mEq/L)   Chloride 101  96 - 112 (mEq/L)   CO2 25  19 - 32 (mEq/L)   Glucose, Bld 129 (*) 70 - 99 (mg/dL)   BUN 15  6 - 23 (mg/dL)   Creatinine, Ser 5.78  0.50 - 1.10 (mg/dL)   Calcium 9.4  8.4 - 46.9 (mg/dL)   Total Protein 6.6  6.0 - 8.3 (g/dL)   Albumin 3.5  3.5 - 5.2 (g/dL)   AST 19  0 - 37 (U/L)   ALT 13  0 - 35 (U/L)   Alkaline Phosphatase 79  39 - 117 (U/L)   Total Bilirubin 0.3  0.3 - 1.2 (mg/dL)   GFR calc non Af Amer 65 (*) >90 (  mL/min)   GFR calc Af Amer 75  (*) >90 (mL/min)  CARDIAC PANEL(CRET KIN+CKTOT+MB+TROPI)      Component Value Range   Total CK 96  7 - 177 (U/L)   CK, MB 2.5  0.3 - 4.0 (ng/mL)   Troponin I <0.30  <0.30 (ng/mL)   Relative Index RELATIVE INDEX IS INVALID  0.0 - 2.5   PRO B NATRIURETIC PEPTIDE      Component Value Range   BNP, POC 741.1 (*) 0 - 125 (pg/mL)  APTT      Component Value Range   aPTT 29  24 - 37 (seconds)  PROTIME-INR      Component Value Range   Prothrombin Time 14.4  11.6 - 15.2 (seconds)   INR 1.10  0.00 - 1.49   URINALYSIS, ROUTINE W REFLEX MICROSCOPIC      Component Value Range   Color, Urine YELLOW  YELLOW    Appearance CLEAR  CLEAR    Specific Gravity, Urine 1.006  1.005 - 1.030    pH 7.0  5.0 - 8.0    Glucose, UA NEGATIVE  NEGATIVE (mg/dL)   Hgb urine dipstick NEGATIVE  NEGATIVE    Bilirubin Urine NEGATIVE  NEGATIVE    Ketones, ur NEGATIVE  NEGATIVE (mg/dL)   Protein, ur NEGATIVE  NEGATIVE (mg/dL)   Urobilinogen, UA 0.2  0.0 - 1.0 (mg/dL)   Nitrite NEGATIVE  NEGATIVE    Leukocytes, UA SMALL (*) NEGATIVE   GLUCOSE, CAPILLARY      Component Value Range   Glucose-Capillary 112 (*) 70 - 99 (mg/dL)  URINE MICROSCOPIC-ADD ON      Component Value Range   WBC, UA 0-2  <3 (WBC/hpf)   Dg Chest Port 1 View  04/10/2011  *RADIOLOGY REPORT*  Clinical Data: Near-syncopal episode, hypertension.  PORTABLE CHEST - 1 VIEW  Comparison: None.  Findings: Cardiomegaly.  No focal consolidation.  Mediastinal contours otherwise within normal limits.  No pneumothorax. Surgical clips right axilla.  No acute osseous abnormality.  IMPRESSION: Cardiomegaly without focal consolidation.  Original Report Authenticated By: Waneta Martins, M.D.    Date: 04/10/2011  Rate: 70  Rhythm: sinus bradycardia  QRS Axis: normal  Intervals: normal  ST/T Wave abnormalities: normal  Conduction Disutrbances:borderline AV conduction delay  Narrative Interpretation:   Old EKG Reviewed: none available       MDM  1. Near  syncope The cliniacal manifestation is consistent with the diagnosis. The etiologies could be from cardiac source, carotid artery, and vaso vagal source. - will perform cardiac workup -EKGs -will likely admit to inpt for further workup.  11:33 PM Discussed with hospitalist and will admit pt to Telemetry.      Dede Query, MD 04/10/11 2248  Dede Query, MD 04/10/11 1610  Dede Query, MD 04/10/11 2344

## 2011-04-10 NOTE — ED Notes (Signed)
MD at bedside. 

## 2011-04-10 NOTE — ED Notes (Signed)
Patient with Low HR, Coreg was ordered. MD at bedside and states not to give the medications.

## 2011-04-10 NOTE — ED Notes (Signed)
Pt states she has been allergic to 4 b/p meds since Feb  Pt states her pressure has been going up and down  Pt states a month ago she was having spells where she would black out  Pt states she had one tonight  Pt states she did not pass out but her pulse was beating rapidly, became dizzy, and light headed and she could not see anything everything went black and she became tingly all over

## 2011-04-11 ENCOUNTER — Other Ambulatory Visit: Payer: Self-pay

## 2011-04-11 ENCOUNTER — Encounter (HOSPITAL_COMMUNITY): Payer: Self-pay | Admitting: *Deleted

## 2011-04-11 ENCOUNTER — Inpatient Hospital Stay (HOSPITAL_COMMUNITY): Payer: Medicare Other

## 2011-04-11 DIAGNOSIS — R55 Syncope and collapse: Secondary | ICD-10-CM | POA: Diagnosis present

## 2011-04-11 DIAGNOSIS — R001 Bradycardia, unspecified: Secondary | ICD-10-CM | POA: Diagnosis present

## 2011-04-11 DIAGNOSIS — I517 Cardiomegaly: Secondary | ICD-10-CM

## 2011-04-11 LAB — DIFFERENTIAL
Basophils Relative: 1 % (ref 0–1)
Eosinophils Absolute: 0.2 10*3/uL (ref 0.0–0.7)
Eosinophils Relative: 5 % (ref 0–5)
Monocytes Relative: 8 % (ref 3–12)
Neutrophils Relative %: 46 % (ref 43–77)

## 2011-04-11 LAB — COMPREHENSIVE METABOLIC PANEL
Albumin: 3 g/dL — ABNORMAL LOW (ref 3.5–5.2)
Alkaline Phosphatase: 61 U/L (ref 39–117)
BUN: 11 mg/dL (ref 6–23)
Calcium: 8.7 mg/dL (ref 8.4–10.5)
GFR calc Af Amer: >90 mL/min (ref >90)
Glucose, Bld: 117 mg/dL — ABNORMAL HIGH (ref 70–99)
Potassium: 3.5 mEq/L (ref 3.5–5.1)
Total Protein: 6 g/dL (ref 6.0–8.3)

## 2011-04-11 LAB — CBC
MCH: 29.4 pg (ref 26.0–34.0)
MCHC: 33.7 g/dL (ref 30.0–36.0)
MCV: 87.4 fL (ref 78.0–100.0)
Platelets: 170 10*3/uL (ref 150–400)

## 2011-04-11 LAB — CARDIAC PANEL(CRET KIN+CKTOT+MB+TROPI)
Relative Index: INVALID (ref 0.0–2.5)
Total CK: 95 U/L (ref 7–177)
Troponin I: <0.3 ng/mL (ref <0.30)

## 2011-04-11 LAB — TSH: TSH: 0.01 u[IU]/mL — ABNORMAL LOW (ref 0.350–4.500)

## 2011-04-11 MED ORDER — LEVOTHYROXINE SODIUM 112 MCG PO TABS
112.0000 ug | ORAL_TABLET | Freq: Every day | ORAL | Status: DC
  Administered 2011-04-11 – 2011-04-14 (×4): 112 ug via ORAL
  Filled 2011-04-11 (×5): qty 1

## 2011-04-11 MED ORDER — ASPIRIN 325 MG PO TABS
325.0000 mg | ORAL_TABLET | Freq: Every day | ORAL | Status: DC
  Administered 2011-04-11 – 2011-04-14 (×4): 325 mg via ORAL
  Filled 2011-04-11 (×5): qty 1

## 2011-04-11 MED ORDER — HYDRALAZINE HCL 50 MG PO TABS
50.0000 mg | ORAL_TABLET | Freq: Four times a day (QID) | ORAL | Status: DC
  Administered 2011-04-11 – 2011-04-12 (×4): 50 mg via ORAL
  Filled 2011-04-11 (×7): qty 1

## 2011-04-11 MED ORDER — PANTOPRAZOLE SODIUM 40 MG IV SOLR
40.0000 mg | INTRAVENOUS | Status: DC
  Administered 2011-04-11 – 2011-04-12 (×2): 40 mg via INTRAVENOUS
  Filled 2011-04-11 (×4): qty 40

## 2011-04-11 MED ORDER — HYDRALAZINE HCL 25 MG PO TABS
25.0000 mg | ORAL_TABLET | Freq: Three times a day (TID) | ORAL | Status: DC
  Administered 2011-04-11: 25 mg via ORAL
  Filled 2011-04-11 (×5): qty 1

## 2011-04-11 MED ORDER — CLONIDINE HCL 0.2 MG PO TABS
0.2000 mg | ORAL_TABLET | Freq: Three times a day (TID) | ORAL | Status: DC
  Administered 2011-04-11: 0.2 mg via ORAL
  Filled 2011-04-11 (×2): qty 1
  Filled 2011-04-11: qty 2
  Filled 2011-04-11: qty 1

## 2011-04-11 MED ORDER — SODIUM CHLORIDE 0.9 % IV SOLN
INTRAVENOUS | Status: DC
  Administered 2011-04-11: 07:00:00 via INTRAVENOUS

## 2011-04-11 MED ORDER — IOHEXOL 350 MG/ML SOLN
80.0000 mL | Freq: Once | INTRAVENOUS | Status: AC | PRN
  Administered 2011-04-11: 80 mL via INTRAVENOUS

## 2011-04-11 MED ORDER — GABAPENTIN 300 MG PO CAPS
300.0000 mg | ORAL_CAPSULE | Freq: Every day | ORAL | Status: DC
  Administered 2011-04-11 – 2011-04-14 (×4): 300 mg via ORAL
  Filled 2011-04-11 (×5): qty 1

## 2011-04-11 MED ORDER — SODIUM CHLORIDE 0.9 % IV SOLN
INTRAVENOUS | Status: AC
  Administered 2011-04-11: 11:00:00 via INTRAVENOUS

## 2011-04-11 MED ORDER — MINOXIDIL 2.5 MG PO TABS
2.5000 mg | ORAL_TABLET | Freq: Every day | ORAL | Status: DC
  Administered 2011-04-11 – 2011-04-12 (×2): 2.5 mg via ORAL
  Filled 2011-04-11 (×3): qty 1

## 2011-04-11 MED ORDER — ISOSORBIDE MONONITRATE ER 30 MG PO TB24
30.0000 mg | ORAL_TABLET | Freq: Every day | ORAL | Status: DC
  Administered 2011-04-11 – 2011-04-15 (×5): 30 mg via ORAL
  Filled 2011-04-11 (×5): qty 1

## 2011-04-11 MED ORDER — HYDRALAZINE HCL 20 MG/ML IJ SOLN
10.0000 mg | INTRAMUSCULAR | Status: DC | PRN
  Administered 2011-04-11 – 2011-04-12 (×2): 10 mg via INTRAVENOUS
  Filled 2011-04-11 (×2): qty 0.5

## 2011-04-11 MED ORDER — SIMVASTATIN 20 MG PO TABS
20.0000 mg | ORAL_TABLET | Freq: Every day | ORAL | Status: DC
  Administered 2011-04-11 – 2011-04-14 (×4): 20 mg via ORAL
  Filled 2011-04-11 (×5): qty 1

## 2011-04-11 NOTE — ED Notes (Signed)
Pt c/o periods of "passing out" and elevated BP x6 mons, pt unable to get a HTN medication d/t allergic reaction to last 4 BP meds

## 2011-04-11 NOTE — ED Notes (Signed)
Report given to [facility] 

## 2011-04-11 NOTE — Progress Notes (Signed)
Gloria Joseph CSN:[id number],MRN:[id number] is a 70 y.o. female,  Outpatient Primary MD for the patient is Marga Melnick, MD, MD Chief Complaint  Patient presents with  . Near Syncope      04/11/2011   Subjective:   Gloria Joseph today has, No headache, No chest pain, No abdominal pain - No Nausea, No new weakness tingling or numbness, No Cough - SOB.   Objective:   Filed Vitals:   04/10/11 2345 04/10/11 2351 04/11/11 0337 04/11/11 0444  BP: 220/61 220/61 207/53 109/46  Pulse: 56 54 48 49  Temp:    98.1 F (36.7 C)  TempSrc:    Oral  Resp: 17   19  SpO2: 100% 100% 100% 97%   Wt Readings from Last 3 Encounters:  03/20/11 76.261 kg (168 lb 2 oz)  02/14/11 79.198 kg (174 lb 9.6 oz)  02/05/11 77.293 kg (170 lb 6.4 oz)    Intake/Output Summary (Last 24 hours) at 04/11/11 0943 Last data filed at 04/11/11 0842  Gross per 24 hour  Intake      0 ml  Output    400 ml  Net   -400 ml   Exam Awake Alert, Oriented *3, No new F.N deficits, Normal affect [place].AT,PERRAL Supple Neck,No JVD, No cervical lymphadenopathy appriciated.  Symmetrical Chest wall movement, Good air movement bilaterally, CTAB RRR,No Gallops,Rubs or new Murmurs, No Parasternal Heave +ve B.Sounds, Abd Soft, Non tender, No organomegaly appriciated, No rebound -guarding or rigidity. No Cyanosis, Clubbing or edema, No new Rash or bruise    Data Review  CBC  Lab 04/11/11 0530 04/10/11 2237  WBC 4.4 5.6  HGB 10.0* 11.4*  HCT 29.7* 32.5*  PLT 170 200  MCV 87.4 87.1  MCH 29.4 30.6  MCHC 33.7 35.1  RDW 13.1 13.1  LYMPHSABS 1.8 1.4  MONOABS 0.4 0.7  EOSABS 0.2 0.3  BASOSABS 0.1 0.1  BANDABS -- --    Lab 04/11/11 0530 04/10/11 2237  NA 141 135  K 3.5 3.7  CL 109 101  CO2 24 25  GLUCOSE 117* 129*  BUN 11 15  CREATININE 0.75 0.88  CALCIUM 8.7 9.4  MG 1.7 --    Lab 04/10/11 2237  INR 1.10  PROTIME --   Cardiac markers:  Lab 04/11/11 0115 04/10/11 2237  CKMB 2.1 2.5  TROPONINI <0.30 <0.30    MYOGLOBIN -- --    Lab 04/10/11 2237  POCBNP 741.1*   No results found for this or any previous visit (from the past 240 hour(s)).  Radiology Reports Dg Chest Port 1 View  04/10/2011  *RADIOLOGY REPORT*  Clinical Data: Near-syncopal episode, hypertension.  PORTABLE CHEST - 1 VIEW  Comparison: None.  Findings: Cardiomegaly.  No focal consolidation.  Mediastinal contours otherwise within normal limits.  No pneumothorax. Surgical clips right axilla.  No acute osseous abnormality.  IMPRESSION: Cardiomegaly without focal consolidation.  Original Report Authenticated By: Waneta Martins, M.D.   Scheduled Meds:   . aspirin  325 mg Oral Daily  . cloNIDine  0.2 mg Oral Once  . gabapentin  300 mg Oral QHS  . hydrALAZINE  50 mg Oral Q6H  . isosorbide mononitrate  30 mg Oral Daily  . levothyroxine  112 mcg Oral QAC breakfast  . minoxidil  2.5 mg Oral Daily  . pantoprazole (PROTONIX) IV  40 mg Intravenous Q24H  . simvastatin  20 mg Oral QHS  . DISCONTD: carvedilol  12.5 mg Oral To ER  . DISCONTD: cloNIDine  0.2 mg Oral TID  .  DISCONTD: hydrALAZINE  25 mg Oral Q8H   Continuous Infusions:   . sodium chloride 50 mL/hr at 04/11/11 0644   PRN Meds:.hydrALAZINE  Assessment & Plan  Principal Problem:  1. Multiple Syncopal episodes - with Bradycardia but no documented Hypotension - will DC Clonidene and B blocker D/W cards , add low dose Minoxidil to Hydralazine with holding parameters, Tele, Stat Head CT as not done in ER, D/W Dr Irine Seal will see the patient soon.   2.  DIABETES MELLITUS, TYPE II, CONTROLLED- continue monitoring stable control CBG (last 3)   Basename 04/10/11 2223  GLUCAP 112*    3. HYPERLIPIDEMIA - on statin.  4.  HYPERTENSION- As #1  5.  HYPOTHYROIDISM - TSH low, meds changed by PCP last week, outpt follow.  Bradycardia   DVT Prophylaxis SCDs  See all Orders from today for further details

## 2011-04-11 NOTE — Consult Note (Signed)
[facility] Consult Note  Date: 04/11/2011  Patient name: Gloria Joseph Medical record number: [id number] Date of birth: 12-Jun-1940 Age: 70 y.o. Gender: female    Reason For Consult: near syncope  Chief Complaint: blacking out History of Present Illness: Gloria Joseph is an 70 y.o. Caucasian female with history of hypertension, diabetes mellitus, and hypothyroidism with c/o of  'blacking out' while playing [employer] last night.  Was seated, felt her pulse rate pick up and 'everything went black"  Lasted several seconds.  No LOC, seizure activity or confusion.  Felt flushed.    Three similar episodes occurred about a month ago and pt was seen at [facility] ER.  All episodes occur while sitting accompanied by feeling fainting and hot, general tinglings and weakness.  No chest pain or shortness of breath or diaphoresis.    Pt states that she has had difficult to control high blood pressure since March of 2012 and developed intolerances to many antihypertensive medications.      Meds Inpatient  Scheduled:   . aspirin  325 mg Oral Daily  . cloNIDine  0.2 mg Oral Once  . gabapentin  300 mg Oral QHS  . hydrALAZINE  50 mg Oral Q6H  . isosorbide mononitrate  30 mg Oral Daily  . levothyroxine  112 mcg Oral QAC breakfast  . minoxidil  2.5 mg Oral Daily  . pantoprazole (PROTONIX) IV  40 mg Intravenous Q24H  . simvastatin  20 mg Oral QHS  . DISCONTD: carvedilol  12.5 mg Oral To ER  . DISCONTD: cloNIDine  0.2 mg Oral TID  . DISCONTD: hydrALAZINE  25 mg Oral Q8H    Prior to Admission medications   Medication Sig Start Date End Date Taking? Authorizing Provider  carvedilol (COREG) 25 MG tablet Take 12.5 mg by mouth 2 (two) times daily.     Yes Historical Provider, MD  cloNIDine (CATAPRES) 0.2 MG tablet Take 0.2 mg by mouth 2 (two) times daily.     Yes Historical Provider, MD  gabapentin (NEURONTIN) 300 MG capsule Take 300 mg by mouth at bedtime.     Yes Historical Provider, MD    levothyroxine (SYNTHROID, LEVOTHROID) 112 MCG tablet Take 112 mcg by mouth daily. 1/2 tablet on Tuesday and Thursday. All other days pt takes 1 tablet    Yes Historical Provider, MD  simvastatin (ZOCOR) 20 MG tablet Take 20 mg by mouth at bedtime.     Yes Historical Provider, MD     Allergies: Lisinopril- angioedema Hctz- hypokalemia  Spironolactone- K+ abn Amlodipine besylate- edema Penicillins  Past Medical History  Diagnosis Date  . Diabetes mellitus, type 2   . Hypertension   . Osteopenia   . Disorder of thyroid   . Hyperlipidemia   . Cancer   . Hypothyroidism   . Neuromuscular disorder    Past Surgical History  Procedure Date  . Breast enhancement surgery   . Breast implant removal 2000    Unilaterally   . Cholecystectomy   . Colonoscopy 2002    Neg  . Rai 04/04/2006  . Mastectomy    Family History  Problem Relation Age of Onset  . Depression Mother   . Alcohol abuse Father   . Heart attack Brother 58   Social History: Divorced.  Lives alone.  Still drives, but hasn't in the last month due to these episodes.  Retired Systems developer.  Reports that she has never smoked. She does not have any smokeless tobacco history on file.  She reports that she does not drink alcohol or use illicit drugs.  Review of Systems: No headache, fever, or sore throat. No shortness of breath or dyspnea on exertion. No chest pain, chest pressure or palpitation. No nausea, vomiting, or abdominal pain. No melena, diarrhea or incontinence. No muscle weakness.    Examination:  Blood pressure 109/46, pulse 49, temperature 98.1 F (36.7 C), temperature source Oral, resp. rate 19, SpO2 97.00%. In general, patient is well developed, well nourished in NAD.    Cardiovascular: The patient has a regular rate and rhythm with 1/6 systolic murmer precordial.  Has Right carotid bruit.  Distal pulses are intact.  Mental status:   The patient is oriented to person, place and time. Recent and remote  memory are intact. Attention span and concentration are normal. Language including repetition, naming, following commands are intact. Fund of knowledge of current and historical events, as well as vocabulary are normal. No aphasia or dysarthria.  Cranial Nerves: Pupils are equally round and reactive to light. Visual fields full to confrontation. Extraocular movements are intact without nystagmus. Facial sensation and muscles of mastication are intact. Muscles of facial expression are symmetric. Tongue protrusion midline.  Shoulder shrug intact  Motor:  Strength 5/5 x 4 extremities. The patient has normal bulk and tone of all extremities, no pronator drift.  There are no adventitious movements.   Reflexes:   Biceps  Triceps Brachioradialis Knee Ankle  Right 2+  2+  2+   1+ 0+  Left  2+  2+  2+   1+ 0+  Plantars: L upgoing,  R mute.  Coordination:  Normal finger to nose/ heel to shin.  No dysdiadokinesia. Rapid alternating movements intact.  Sensation: intact to light touch throughout.  Discrimination intact..   Labs: CBC    Component Value Date/Time   WBC 4.4 04/11/2011 0530   RBC 3.40* 04/11/2011 0530   HGB 10.0* 04/11/2011 0530   HCT 29.7* 04/11/2011 0530   PLT 170 04/11/2011 0530   MCV 87.4 04/11/2011 0530   MCH 29.4 04/11/2011 0530   MCHC 33.7 04/11/2011 0530   RDW 13.1 04/11/2011 0530   LYMPHSABS 1.8 04/11/2011 0530   MONOABS 0.4 04/11/2011 0530   EOSABS 0.2 04/11/2011 0530   BASOSABS 0.1 04/11/2011 0530   Chemistry    Component Value Date/Time   NA 141 04/11/2011 0530   K 3.5 04/11/2011 0530   CL 109 04/11/2011 0530   CO2 24 04/11/2011 0530   GLUCOSE 117* 04/11/2011 0530   BUN 11 04/11/2011 0530   CREATININE 0.75 04/11/2011 0530   CALCIUM 8.7 04/11/2011 0530   MG 1.7 04/11/2011 0530   AST 16 04/11/2011 0530   ALT 9 04/11/2011 0530   ALKPHOS 61 04/11/2011 0530   BILITOT 0.3 04/11/2011 0530   PROT 6.0 04/11/2011 0530   ALBUMIN 3.0* 04/11/2011 0530   Lipid  Panel    Component Value Date/Time   CHOL 190 11/22/2010 0953   TRIG 107.0 11/22/2010 0953   HDL 68.20 11/22/2010 0953   CHOLHDL 3 11/22/2010 0953   VLDL 21.4 11/22/2010 0953   LDLCALC 100* 11/22/2010 0953   A1C    Component Value Date/Time   HGBA1C 5.7 03/20/2011 1157   HGBA1C 5.6 07/04/2010 1219   Cardiac Enzymes    Component Value Date/Time   CKTOTAL 95 04/11/2011 0115   CKTOTAL 96 04/10/2011 2237   CKMB 2.1 04/11/2011 0115   CKMB 2.5 04/10/2011 2237   TROPONINI <0.30 04/11/2011 0115   TROPONINI <  0.30 04/10/2011 2237   Thyroid    Component Value Date/Time   TSH 0.010* 04/10/2011 2237   Anemia    Component Value Date/Time   VITAMINB12 324 03/20/2011 1157   IRON 61 03/20/2011 1157   Micro No results found for this or any previous visit (from the past 240 hour(s)).  Imaging: Dg Chest Port 1 View  04/10/2011  *RADIOLOGY REPORT*  Clinical Data: Near-syncopal episode, hypertension.  PORTABLE CHEST - 1 VIEW  Comparison: None.  Findings: Cardiomegaly.  No focal consolidation.  Mediastinal contours otherwise within normal limits.  No pneumothorax. Surgical clips right axilla.  No acute osseous abnormality.  IMPRESSION: Cardiomegaly without focal consolidation.  Original Report Authenticated By: Waneta Martins, M.D.    Assessment Gloria Joseph is a 70 y.o. female with near- syncope.  Sounds cardio/ vasogenic.  Iatrogenic hyperthyriodism may be contributing.   Has R carotid bruit on exam and labile hypertension.   Cannot rule out TIA/CVA or seizure by exam alone, though unlikely by history.     Recommendations: 1.TSH low; rec T3, T4.  Will likely require lower dose synthroid. 2. Agree with 2-D echo, telemetry, head CT/CTA head and neck, EEG.  Thank you for having [place] see Gloria Joseph in consultation.  Please call with any questions.   LOS: 1 day   Jana Hakim [facility] [phone] 04/11/2011  10:47 AM

## 2011-04-11 NOTE — H&P (Signed)
PCP:  Marga Melnick, MD, MD Chief Complaint:  "I blacked out on 3 occasions today".  HPI:  Patient is a 70 year old very pleasant Caucasian female with history of hypertension, diabetes mellitus dietary controlled and hypothyroidism presenting to the emergency room with history of blacking out on three occasions today. Patient claimed that she was resting at home and all of a sudden she blacked out. Denied any premonitory symptoms prior to the onset of blacking out. She denied any chest pain or shortness of breath. Denied any history of diaphoresis. She denied any cough. She denied any fever, chills or rigors. He denied any history of urinary or fecal incontinence. Patient claimed that post blacking out she had tingling  sensation in the left arm. She denied any history of blurry vision. She had two more episodes of blacking out and thereafter decided to come to the emergency to be evaluated.  Review of Systems:  The patient denies anorexia, fever, weight loss,, vision loss, decreased hearing, hoarseness, chest pain, syncope, dyspnea on exertion, peripheral edema, balance deficits, hemoptysis, abdominal pain, melena, hematochezia, severe indigestion/heartburn, hematuria, incontinence, genital sores, muscle weakness, suspicious skin lesions, transient blindness, difficulty walking, depression, unusual weight change, abnormal bleeding, enlarged lymph nodes, angioedema, and breast masses.  Past Medical History:  Past Medical History  Diagnosis Date  . Diabetes mellitus, type 2   . Hypertension   . Osteopenia   . Disorder of thyroid   . Hyperlipidemia     Past Surgical History  Procedure Date  . Breast enhancement surgery   . Breast implant removal 2000    Unilaterally   . Cholecystectomy   . Colonoscopy 2002    Neg  . Rai 04/04/2006  . Mastectomy     Medications:  Prior to Admission medications   Medication Sig Start Date End Date Taking? Authorizing Provider  carvedilol  (COREG) 25 MG tablet Take 12.5 mg by mouth 2 (two) times daily.     Yes Historical Provider, MD  cloNIDine (CATAPRES) 0.2 MG tablet Take 0.2 mg by mouth 2 (two) times daily.     Yes Historical Provider, MD  gabapentin (NEURONTIN) 300 MG capsule Take 300 mg by mouth at bedtime.     Yes Historical Provider, MD  levothyroxine (SYNTHROID, LEVOTHROID) 112 MCG tablet Take 112 mcg by mouth daily. 1/2 tablet on Tuesday and Thursday. All other days pt takes 1 tablet    Yes Historical Provider, MD  simvastatin (ZOCOR) 20 MG tablet Take 20 mg by mouth at bedtime.     Yes Historical Provider, MD    Allergies:  Allergies  Allergen Reactions  . Lisinopril     ? Angioedema (NOT ARB candidate)  . Hctz (Hydrochlorothiazide)     hyponatremia  . Spironolactone     REACTION: low potassium ???  . Amlodipine Besylate     REACTION: edema    Social History:   reports that she has never smoked. She does not have any smokeless tobacco history on file. She reports that she does not drink alcohol or use illicit drugs.  Family History:  Family History  Problem Relation Age of Onset  . Depression Mother   . Alcohol abuse Father   . Heart attack Brother 65  . Cancer Other     Uncle, not specific  . Cancer Other     Aunt, not specific. GYN CA  . Colon cancer Other     Grandfather-not specific  . Breast cancer Other     Aunt, not specific  .  Diabetes Other     Grandmother, not specific    Physical Exam:  Filed Vitals:   04/10/11 2122 04/10/11 2124 04/10/11 2345 04/10/11 2351  BP: 209/75 199/54 220/61 220/61  Pulse: 52  56 54  Temp: 98.7 F (37.1 C)     TempSrc: Oral     Resp: 18  17   SpO2: 99%  100% 100%      General: Alert and oriented times three, well developed and nourished, no acute distress,dehydrated  Eyes: PERRLA, pink conjunctiva, scleral icterus  ENT: Dry oral mucosa, neck supple, no thyromegaly  Lungs: clear to ascultation, no wheeze, no crackles, no use of accessory  muscles  Cardiovascular: regular rate and rhythm, no regurgitation, no gallops, no murmurs. No carotid bruits, no JVD  Abdomen: soft, positive BS, non-tender, non-distended, no organomegaly, not an acute abdomen  GU: not examined  Neuro: CN II - XII grossly intact, sensation intact  Musculoskeletal: strength 5/5 all extremities, no clubbing, cyanosis or edema  Skin: Dry  Psych: appropriate patient  ?  Labs on Admission:   [facility] 04/10/11 2237  NA 135  K 3.7  CL 101  CO2 25  GLUCOSE 129*  BUN 15  CREATININE 0.88  CALCIUM 9.4  MG --  PHOS --     Basename 04/10/11 2237  AST 19  ALT 13  ALKPHOS 79  BILITOT 0.3  PROT 6.6  ALBUMIN 3.5    No results found for this basename: LIPASE:2,AMYLASE:2 in the last 72 hours   Basename 04/10/11 2237  WBC 5.6  NEUTROABS 3.2  HGB 11.4*  HCT 32.5*  MCV 87.1  PLT 200     Basename 04/10/11 2237  CKTOTAL 96  CKMB 2.5  CKMBINDEX --  TROPONINI <0.30    No results found for this basename: TSH,T4TOTAL,FREET3,T3FREE,THYROIDAB in the last 72 hours  No results found for this basename: VITAMINB12:2,FOLATE:2,FERRITIN:2,TIBC:2,IRON:2,RETICCTPCT:2 in the last 72 hours  Radiological Exams on Admission:  Dg Chest Port 1 View  04/10/2011  *RADIOLOGY REPORT*  Clinical Data: Near-syncopal episode, hypertension.  PORTABLE CHEST - 1 VIEW  Comparison: None.  Findings: Cardiomegaly.  No focal consolidation.  Mediastinal contours otherwise within normal limits.  No pneumothorax. Surgical clips right axilla.  No acute osseous abnormality.  IMPRESSION: Cardiomegaly without focal consolidation.  Original Report Authenticated By: Waneta Martins, M.D.    Assessment/Plan Problems: #1 blackout #2 dehydration #3 tingling sensation left arm  Impression: #1 syncope #2 dehydration #3 systolic hypertension #4 hyperlipidemia #5 hypothyroidism #6 diabetes mellitus-dietary controlled  #7 bradycardia  Plan: #1 start IV hydration  with normal saline #2 treat systolic hypertension which hydralazine. #3 treat hypothyroidism with Synthroid.  #4 order TSH. T3. T4. Cardiac enzyme q8 x3, magnesium level, CBC, CMP we will be repeated in a.m. #5 order CT brain, 2-D echo, carotid duplex and EEG #6 she'll be reevaluated with lab results as well as imaging studies.

## 2011-04-12 ENCOUNTER — Ambulatory Visit (HOSPITAL_COMMUNITY): Payer: PRIVATE HEALTH INSURANCE

## 2011-04-12 ENCOUNTER — Encounter (HOSPITAL_COMMUNITY): Payer: Self-pay | Admitting: Cardiology

## 2011-04-12 DIAGNOSIS — R55 Syncope and collapse: Secondary | ICD-10-CM

## 2011-04-12 LAB — BASIC METABOLIC PANEL
CO2: 23 mEq/L (ref 19–32)
Calcium: 8.9 mg/dL (ref 8.4–10.5)
Chloride: 110 mEq/L (ref 96–112)
Glucose, Bld: 109 mg/dL — ABNORMAL HIGH (ref 70–99)
Potassium: 3.6 mEq/L (ref 3.5–5.1)
Sodium: 141 mEq/L (ref 135–145)

## 2011-04-12 LAB — MAGNESIUM: Magnesium: 1.7 mg/dL (ref 1.5–2.5)

## 2011-04-12 MED ORDER — METOPROLOL TARTRATE 1 MG/ML IV SOLN
INTRAVENOUS | Status: AC
  Administered 2011-04-12: 5 mg
  Filled 2011-04-12: qty 5

## 2011-04-12 MED ORDER — ONDANSETRON HCL 4 MG/2ML IJ SOLN
INTRAMUSCULAR | Status: AC
  Administered 2011-04-12: 4 mg
  Filled 2011-04-12: qty 2

## 2011-04-12 MED ORDER — METOPROLOL TARTRATE 1 MG/ML IV SOLN
INTRAVENOUS | Status: AC
  Filled 2011-04-12: qty 5

## 2011-04-12 MED ORDER — ONDANSETRON HCL 4 MG/2ML IJ SOLN
INTRAMUSCULAR | Status: AC
  Filled 2011-04-12: qty 2

## 2011-04-12 MED ORDER — MINOXIDIL 2.5 MG PO TABS
5.0000 mg | ORAL_TABLET | Freq: Every day | ORAL | Status: DC
  Administered 2011-04-12 – 2011-04-15 (×4): 5 mg via ORAL
  Filled 2011-04-12 (×4): qty 2

## 2011-04-12 MED ORDER — CLONIDINE HCL 0.1 MG PO TABS
0.1000 mg | ORAL_TABLET | Freq: Four times a day (QID) | ORAL | Status: DC | PRN
  Filled 2011-04-12 (×3): qty 1

## 2011-04-12 MED ORDER — HYDRALAZINE HCL 50 MG PO TABS
75.0000 mg | ORAL_TABLET | Freq: Four times a day (QID) | ORAL | Status: DC
  Administered 2011-04-12 – 2011-04-15 (×12): 75 mg via ORAL
  Filled 2011-04-12 (×16): qty 1

## 2011-04-12 MED ORDER — HYDROCODONE-ACETAMINOPHEN 5-325 MG PO TABS
1.0000 | ORAL_TABLET | Freq: Four times a day (QID) | ORAL | Status: DC | PRN
  Administered 2011-04-12: 1 via ORAL
  Filled 2011-04-12: qty 1

## 2011-04-12 MED ORDER — PANTOPRAZOLE SODIUM 40 MG PO TBEC
40.0000 mg | DELAYED_RELEASE_TABLET | Freq: Every day | ORAL | Status: DC
  Administered 2011-04-13 – 2011-04-15 (×3): 40 mg via ORAL
  Filled 2011-04-12 (×4): qty 1

## 2011-04-12 MED ORDER — SPIRONOLACTONE 25 MG PO TABS
25.0000 mg | ORAL_TABLET | Freq: Every day | ORAL | Status: DC
  Filled 2011-04-12: qty 1

## 2011-04-12 MED ORDER — ACETAMINOPHEN 325 MG PO TABS
650.0000 mg | ORAL_TABLET | Freq: Four times a day (QID) | ORAL | Status: DC | PRN
  Administered 2011-04-12 (×2): 650 mg via ORAL
  Filled 2011-04-12 (×2): qty 2

## 2011-04-12 MED ORDER — METOPROLOL TARTRATE 1 MG/ML IV SOLN
5.0000 mg | Freq: Four times a day (QID) | INTRAVENOUS | Status: DC | PRN
Start: 2011-04-12 — End: 2011-04-15

## 2011-04-12 MED ORDER — SPIRONOLACTONE 25 MG PO TABS
25.0000 mg | ORAL_TABLET | Freq: Every day | ORAL | Status: DC
  Administered 2011-04-12 – 2011-04-15 (×4): 25 mg via ORAL
  Filled 2011-04-12 (×4): qty 1

## 2011-04-12 MED ORDER — PROMETHAZINE HCL 25 MG/ML IJ SOLN
12.5000 mg | Freq: Once | INTRAMUSCULAR | Status: AC
  Administered 2011-04-12: 12.5 mg via INTRAVENOUS
  Filled 2011-04-12: qty 1

## 2011-04-12 NOTE — Progress Notes (Signed)
PT IS TO BE DC HOME TODAY WITH HH PT SAFETY EVAL  WITH [facility].  PT STATED THAT SHE WILL BE AT HOME WITH HER SON AND THAT SHE DOESN'T NEED A WALKER.   Gloria Joseph 04/12/2011 [phone] OR [phone]

## 2011-04-12 NOTE — Progress Notes (Signed)
Gloria Joseph CSN:[id number],MRN:[id number] is a 70 y.o. female,  Outpatient Primary MD for the patient is Marga Melnick, MD, MD Chief Complaint  Patient presents with  . Near Syncope      04/12/2011   Subjective:   Gloria Joseph today has, No headache, No chest pain, No abdominal pain - No Nausea, No new weakness tingling or numbness, No Cough - SOB.   Objective:   Filed Vitals:   04/12/11 1024 04/12/11 1343 04/12/11 1538 04/12/11 1656  BP: 170/88 193/78 184/84 188/102  Pulse:  84  115  Temp:  98.2 F (36.8 C)  97.4 F (36.3 C)  TempSrc:  Oral  Oral  Resp:  20  22  Height:      Weight:      SpO2:  99%  98%   Wt Readings from Last 3 Encounters:  04/11/11 76.2 kg (167 lb 15.9 oz)  03/20/11 76.261 kg (168 lb 2 oz)  02/14/11 79.198 kg (174 lb 9.6 oz)    Intake/Output Summary (Last 24 hours) at 04/12/11 1732 Last data filed at 04/12/11 1225  Gross per 24 hour  Intake      0 ml  Output   2100 ml  Net  -2100 ml   Exam Awake Alert, Oriented *3, No new F.N deficits, Normal affect [place].AT,PERRAL Supple Neck,No JVD, No cervical lymphadenopathy appriciated.  Symmetrical Chest wall movement, Good air movement bilaterally, CTAB RRR,No Gallops,Rubs or new Murmurs, No Parasternal Heave +ve B.Sounds, Abd Soft, Non tender, No organomegaly appriciated, No rebound -guarding or rigidity. No Cyanosis, Clubbing or edema, No new Rash or bruise    Data Review  CBC  Lab 04/11/11 0530 04/10/11 2237  WBC 4.4 5.6  HGB 10.0* 11.4*  HCT 29.7* 32.5*  PLT 170 200  MCV 87.4 87.1  MCH 29.4 30.6  MCHC 33.7 35.1  RDW 13.1 13.1  LYMPHSABS 1.8 1.4  MONOABS 0.4 0.7  EOSABS 0.2 0.3  BASOSABS 0.1 0.1  BANDABS -- --    Lab 04/12/11 0630 04/11/11 0530 04/10/11 2237  NA 141 141 135  K 3.6 3.5 3.7  CL 110 109 101  CO2 23 24 25   GLUCOSE 109* 117* 129*  BUN 8 11 15   CREATININE 0.87 0.75 0.88  CALCIUM 8.9 8.7 9.4  MG 1.7 1.7 --    Lab 04/10/11 2237  INR 1.10  PROTIME --   Cardiac  markers:   Lab 04/11/11 0115 04/10/11 2237  CKMB 2.1 2.5  TROPONINI <0.30 <0.30  MYOGLOBIN -- --    Lab 04/10/11 2237  POCBNP 741.1*   No results found for this or any previous visit (from the past 240 hour(s)).  Radiology Reports Dg Chest Port 1 View  04/10/2011  *RADIOLOGY REPORT*  Clinical Data: Near-syncopal episode, hypertension.  PORTABLE CHEST - 1 VIEW  Comparison: None.  Findings: Cardiomegaly.  No focal consolidation.  Mediastinal contours otherwise within normal limits.  No pneumothorax. Surgical clips right axilla.  No acute osseous abnormality.  IMPRESSION: Cardiomegaly without focal consolidation.  Original Report Authenticated By: Waneta Martins, M.D.   Scheduled Meds:    . aspirin  325 mg Oral Daily  . gabapentin  300 mg Oral QHS  . hydrALAZINE  75 mg Oral Q6H  . isosorbide mononitrate  30 mg Oral Daily  . levothyroxine  112 mcg Oral QAC breakfast  . metoprolol      . metoprolol      . minoxidil  5 mg Oral Daily  . ondansetron      .  ondansetron      . pantoprazole  40 mg Oral Q1200  . simvastatin  20 mg Oral QHS  . spironolactone  25 mg Oral Daily  . DISCONTD: hydrALAZINE  50 mg Oral Q6H  . DISCONTD: minoxidil  2.5 mg Oral Daily  . DISCONTD: pantoprazole (PROTONIX) IV  40 mg Intravenous Q24H  . DISCONTD: spironolactone  25 mg Oral Daily   Continuous Infusions:    . sodium chloride 50 mL/hr at 04/11/11 1053   PRN Meds:.acetaminophen, cloNIDine, hydrALAZINE, HYDROcodone-acetaminophen, metoprolol  Assessment & Plan  Principal Problem:  1. Multiple Syncopal episodes - with Bradycardia but no documented Hypotension - had to DC scheduled Clonidene and B blocker after D/W cards , has multiple drug allergies, on low dose Minoxidil to Hydralazine with holding parameters, Tele, CT head noted, neuro Dr Irine Seal following along with Dr Ty Hilts cards, D/W Dr Ty Hilts, will add aldactone, now BP ++ high per patient has episodes of high-low BPs, will add PRN  catapres-Lopressor as H rate improved, check renal Art [place].   Last BP 188-102 pt nauseated pushed IV Lopressor and Zofran repat BP 170/85 H rate 85, pt feels much better.  Filed Vitals:   04/12/11 1024 04/12/11 1343 04/12/11 1538 04/12/11 1656  BP: 170/88 193/78 184/84 188/102  Pulse:  84  115  Temp:  98.2 F (36.8 C)  97.4 F (36.3 C)  TempSrc:  Oral  Oral  Resp:  20  22  Height:      Weight:      SpO2:  99%  98%    2.  DIABETES MELLITUS, TYPE II, CONTROLLED- continue monitoring stable control CBG (last 3)   Basename 04/12/11 1653 04/10/11 2223  GLUCAP 123* 112*    3. HYPERLIPIDEMIA - on statin.  4.  HYPERTENSION- As #1  5.  HYPOTHYROIDISM - TSH low, meds changed by PCP last week, outpt follow.  6.  Bradycardia - resolved after DC scheduled Catapres and B blocker.   DVT Prophylaxis SCDs  See all Orders from today for further details

## 2011-04-12 NOTE — Progress Notes (Signed)
Patient nauseated sitting on side of bed with basin and cloth. Claiborne Billings NP made aware

## 2011-04-12 NOTE — Consult Note (Signed)
Patient ID: THAO BAUZA MRN: [id number], DOB/AGE: 1941-04-09   Admit date: 04/10/2011 Date of Consult: 04/12/2011 1:37 PM  Primary Physician: Marga Melnick, MD, MD Primary Cardiologist: New to [place], Consulted by Dr. Johney Frame  Pt. Profile: 70yo caucasian female w/ PMHx significant for HTN, HLD, DM II (diet controlled), hypothyroidism (s/p thyroid gland ablation) and breast cancer (s/p mastectomy) who presented to the [facility] on 04/10/11 with near syncope.  Patient Active Hospital Problem List: Near syncopal episodes  Bradycardia  DIABETES MELLITUS, TYPE II, (DIET CONTROLLED)  HYPERLIPIDEMIA  HYPERTENSION  HYPOTHYROIDISM (s/p thyroid gland ablation)  Past Medical History  Diagnosis Date  . Diabetes mellitus, type 2   . Hypertension   . Osteopenia   . Disorder of thyroid   . Hyperlipidemia   . Cancer   . Hypothyroidism   . Neuromuscular disorder     Past Surgical History  Procedure Date  . Breast enhancement surgery   . Breast implant removal 2000    Unilaterally   . Cholecystectomy   . Colonoscopy 2002    Neg  . Rai 04/04/2006  . Mastectomy   . Tubal ligation   . Breast surgery      Allergies:  Allergies  Allergen Reactions  . Lisinopril     ? Angioedema (NOT ARB candidate)  . Hctz (Hydrochlorothiazide)     hyponatremia  . Spironolactone     REACTION: low potassium ???  . Penicillins Other (See Comments)    Pt states medication doesn't work for her d/t her usage of it several years ago.  . Amlodipine Besylate     REACTION: edema    HPI: 70yo caucasian female w/ PMHx significant for HTN, HLD, DM II (diet controlled), hypothyroidism (s/p thyroid gland ablation) and breast cancer (s/p mastectomy) who presented to the [facility] on 04/10/11 with near syncope.  She first experienced the "blackout spells" about 6wks ago while watching television. She states she had tunnel vision that lasted less than one minute and then spontaneously resolved, then reoccurred  about 10 minutes later, and again 10 minutes after that. She denied any associated symptoms before or after the spells and denies loss of consciousness. She presented to [facility] ED where she reports her BP was in the 200s and was admitted overnight, but says nothing was found to explain the etiology of the episodes.   She has had a decreased appetite, but otherwise felt well since and has been able to exercise three days a week, including walking on the treadmill and using the stationary bicycle without difficulty. She has been having problems with uncontrolled hypertension since February or March and has been on and off multiple BP meds. This has been managed by her primary care physician. She reports having problems with "low sodium and low thyroid" over that time as well.  Wednesday (04/10/11) she was sitting in her chair playing solitaire when she had another "blackout spell" that was followed by feeling hot and tingling from head to toe as well as feeling weak. It lasted less than one minuted and occurred two more times about 10 minutes apart. She denies any symptoms prior to the episodes and denies loss of consciousness, chest pain, shortness of breath, dizziness, headache, loss of bowel or bladder.   She presented to the [facility] where her BP was found and to be 220/73, EKG revealed sinus bradycardia with 1st degree AV block, 49bpm, CXR showed cardiomegaly without focal consolidation, and CTA head/neck demonstrated mild carotid atherosclerosis with no significant arterial  stenosis in the neck or at the thoracic inlet, otherwise negative intracranial CTA without stenosis or major branch occlusion. Her TSH was noted to be low at 0.010, cardiac enzymes were negative x 2, BNP was elevated at 741.1, and ECHO was unremarkable.   Outpatient Medications:  Medication Sig  carvedilol (COREG) 25 MG tablet Take 12.5 mg by mouth 2 (two) times daily.    cloNIDine (CATAPRES) 0.2 MG tablet Take 0.2 mg by mouth 2  (two) times daily.    gabapentin (NEURONTIN) 300 MG capsule Take 300 mg by mouth at bedtime.    levothyroxine (SYNTHROID, LEVOTHROID) 112 MCG tablet Take 112 mcg by mouth daily. 1/2 tablet on Tuesday and Thursday. All other days pt takes 1 tablet   simvastatin (ZOCOR) 20 MG tablet Take 20 mg by mouth at bedtime.      Inpatient Medications:    . aspirin  325 mg Oral Daily  . gabapentin  300 mg Oral QHS  . hydrALAZINE  75 mg Oral Q6H  . isosorbide mononitrate  30 mg Oral Daily  . levothyroxine  112 mcg Oral QAC breakfast  . minoxidil  2.5 mg Oral Daily  . pantoprazole (PROTONIX) IV  40 mg Intravenous Q24H  . simvastatin  20 mg Oral QHS    Family History  Problem Relation Age of Onset  . Depression Mother   . Alcohol abuse Father   . Heart attack Brother 65  . Cancer Other     Uncle, not specific  . Cancer Other     Aunt, not specific. GYN CA  . Colon cancer Other     Grandfather-not specific  . Breast cancer Other     Aunt, not specific  . Diabetes Other     Grandmother, not specific     History   Social History  . Marital Status: Divorced   Occupational History  .    Social History Main Topics  . Smoking status: Never Smoker   . Smokeless tobacco: Not on file  . Alcohol Use: No  . Drug Use: No  . Sexually Active: No   Review of Systems: General: Lack of appetite, Intentional 60lb weight loss over the last year on weight watchers; negative for chills, fever, night sweats Cardiovascular: Right lower leg edema that comes and goes; negative for chest pain, dyspnea on exertion, orthopnea, palpitations, paroxysmal nocturnal dyspnea or shortness of breath Dermatological: negative for rash Respiratory: negative for cough or wheezing Urologic: negative for hematuria Abdominal: negative for nausea, vomiting, diarrhea, bright red blood per rectum, melena, or hematemesis Neurologic: As per HPI All other systems reviewed and are otherwise negative except as noted  above.  Physical Exam: Temp:  [97.4 F (36.3 C)-97.7 F (36.5 C)] 97.7 F (36.5 C) (11/23 0454) Pulse Rate:  [47-52] 52  (11/23 0454) Resp:  [19-20] 19  (11/23 0454) BP: (126-173)/(55-88) 170/88 mmHg (11/23 1024) SpO2:  [97 %-99 %] 98 % (11/23 0454)    General: Pleasant, white female. Well developed, well nourished, in no acute distress. Head: Normocephalic, atraumatic, sclera non-icteric, no xanthomas, nares are without discharge.  Neck: Supple. Negative for carotid bruits. No JVD. Lungs: Clear bilaterally to auscultation without wheezes, rales, or rhonchi. Breathing is unlabored. Heart: II/VI systolic murmur best heard at RUSB; RRR with S1 S2. No rubs, or gallops appreciated. Abdomen: Soft, non-tender, non-distended with normoactive bowel sounds. No rebound/guarding. No obvious abdominal masses. Msk:  Strength and tone appears normal for age. Extremities: Trace to 1+ edema in bilat lower extremities  L>R, nontender. No clubbing or cyanosis.  Distal pedal pulses are 2+ and equal bilaterally. Neuro: Alert and oriented X 3. Moves all extremities spontaneously. Psych:  Responds to questions appropriately with a normal affect.  Labs:   Lab Results  Component Value Date   WBC 4.4 04/11/2011   HGB 10.0* 04/11/2011   HCT 29.7* 04/11/2011   MCV 87.4 04/11/2011   PLT 170 04/11/2011    Lab 04/12/11 0630 04/11/11 0530  NA 141 --  K 3.6 --  CL 110 --  CO2 23 --  BUN 8 --  CREATININE 0.87 --  CALCIUM 8.9 --  PROT -- 6.0  BILITOT -- 0.3  ALKPHOS -- 61  ALT -- 9  AST -- 16  GLUCOSE 109* --  MAGNESIUM  1.7    Basename 04/11/11 0115 04/10/11 2237  CKTOTAL 95 96  CKMB 2.1 2.5  TROPONINI <0.30 <0.30    04/10/2011 22:37  BNP, POC 741.1 (H)    Basename 04/10/11 2237  LABPROT 14.4  INR 1.10    04/10/2011 22:37  TSH 0.010 (L)  Free T4 1.64   UA: Negative   Radiology/Studies:   Ct Angio Head W/cm &/or Wo Cm 04/11/2011  CTA NECK  Findings:  Negative lung apices.   Diminutive thyroid.  Negative larynx, pharynx, parapharyngeal spaces, retropharyngeal space, sublingual space, submandibular glands, parotid glands and orbits. Visualized paranasal sinuses and mastoids are clear.  Degenerative changes in the spine.  No suspicious osseous lesion identified. Poor dentition.  No lymphadenopathy.  Vascular Findings: Minimal aortic arch atherosclerosis.  Three- vessel arch configuration with widely patent great vessel origins.  Mild calcified atherosclerosis of the left subclavian artery is not hemodynamically significant.  Proximal right common carotid artery is tortuous but otherwise within normal limits.  Calcified atherosclerosis occurs at the right carotid bifurcation and extends into the posterior right ICA origin and bulb.  Subsequent stenosis is less than 50 % with respect to the distal vessel.  Otherwise negative cervical right ICA.  Normal right vertebral artery origin.  Tortuous but otherwise normal cervical right vertebral artery.  Tortuous proximal left common carotid artery.  Mild soft and calcified plaque at the left carotid bifurcation extending into the posterior lateral left ICA origin and bulb.  No cervical left ICA stenosis.   Tortuous but otherwise normal left vertebral artery origin. Mildly tortuous and otherwise normal cervical left vertebral artery.   Review of the MIP images confirms the above findings.  IMPRESSION:  1.  Mild carotid atherosclerosis with no significant arterial stenosis in the neck or at the thoracic inlet.  Tortuous great vessels. 2.  Poor dentition.  Recommend dental follow up. 3.  No other acute findings in the neck.  See intracranial findings below.  CTA HEAD  Findings:  Normal cerebral volume.  No ventriculomegaly.  Chronic cerebral white matter hypodensity is confluent and mildly progressed since 2006. No midline shift, mass effect, or evidence of mass lesion.  No acute intracranial hemorrhage identified.  No abnormal enhancement  identified.  No evidence of cortically based acute infarction identified.  Hyperostosis frontalis. No acute osseous abnormality identified.  Vascular Findings: Major intracranial venous structures are enhancing.  Minimal calcified atherosclerosis of the distal left vertebral artery which is dominant.  No subsequent left vertebral artery stenosis.  Normal PICA vessels.  Basilar artery is patent with mild irregularity but no stenosis.  Superior cerebellar arteries are within normal limits.  Normal PCA origins.  Diminutive posterior communicating arteries.  Bilateral PCA branches are within  normal limits.  Mild bilateral ICA siphon calcified atherosclerosis resulting in no hemodynamically significant stenosis.  Normal ophthalmic and posterior communicating artery origins.  Patent carotid termini. Normal MCA and ACA origins.  Normal anterior communicating artery. ACA branches are within normal limits.  Bilateral MCA M1 segments are within normal limits.  Bilateral MCA branches are within normal limits.   Review of the MIP images confirms the above findings.  IMPRESSION: 1.  Minimal to mild calcified atherosclerosis at the skull base. Otherwise negative intracranial CTA without stenosis or major branch occlusion. 2.  Chronic nonspecific cerebral white matter disease with mild progression since 2006. 3. No acute intracranial abnormality.   Dg Chest Port 1 View 04/10/2011  Findings: Cardiomegaly.  No focal consolidation.  Mediastinal contours otherwise within normal limits.  No pneumothorax. Surgical clips right axilla.  No acute osseous abnormality.  IMPRESSION: Cardiomegaly without focal consolidation.    ECHO: 04/11/11 - Impressions: - Normal LV size and systolic function, EF 55%. Mild LV hypertrophy. Abnormal left ventricular relaxation (grade 1 diastolic dysfunction). Normal LV size and systolic function. No significant valvular abnormalities.  EKG: 04/10/11 - Sinus bradycardia w/ 1st degree AV block,  47bpm  Tele: Sinus bradycardia with nonstained episodes of atrial fibrillation  ASSESSMENT AND PLAN:  70yo caucasian female w/ PMHx significant for HTN, HLD, DM II (diet controlled), hypothyroidism (s/p thyroid gland ablation) and breast cancer (s/p mastectomy) who presented to the [facility] on 04/10/11 with near syncope.  1. Near syncope: Likely related to uncontrolled hypertension. Will arrange for f/u event monitor 2. Bradycardia: Chronic and asymptomatic. Not likely contributing to near syncope. 3. HTN: Cont hydralazine and Imdur. Would avoid minoxidil unless BP remains uncontrolled. Add 25mg  spironolactone daily per Dr. Frederik Pear recommendation and cont follow up with him 4. HLD: Cont statin 5. DM: Last A1C 5.7 on 03/20/11. Cont therapeutic lifestyle changes 6. Hypothyroidism: Continue synthroid and f/u with PCP    Signed, HOPE, JESSICA, PA-C 04/12/2011, 1:37 PM  I have seen, examined the patient, and reviewed the above assessment and plan with Ms [facility].  Briefly, pt with long standing poorly controlled HTN who now presents with several episodes of visual loss.  She reports having abrupt diaphoresis, warmth, and "loss of vision" x 3 on Wednesday while seated.  She is very clear that she did not have loss of consciousness with the episodes.  She denies chest pain or tachypalpitations.  History and findings are most suggestive of hypertensive urgency as the cause.  I think that a cardiac/ arrhythmogenic cause is unlikely.  She has chronic bradycardia for which she is mostly asymptomatic.  Plan as above Would add spironolactone for HTN and avoid minoxidil for now.  She should follow-up with Dr Alwyn Ren for long term bp management and consideration of secondary causes. Event monitor at discharge.    Co Sign: Hillis Range, MD 04/12/2011 3:38 PM

## 2011-04-13 ENCOUNTER — Encounter (HOSPITAL_COMMUNITY): Payer: Self-pay | Admitting: Internal Medicine

## 2011-04-13 DIAGNOSIS — R9431 Abnormal electrocardiogram [ECG] [EKG]: Secondary | ICD-10-CM

## 2011-04-13 DIAGNOSIS — I4891 Unspecified atrial fibrillation: Secondary | ICD-10-CM

## 2011-04-13 HISTORY — DX: Unspecified atrial fibrillation: I48.91

## 2011-04-13 LAB — BASIC METABOLIC PANEL
BUN: 8 mg/dL (ref 6–23)
CO2: 23 mEq/L (ref 19–32)
Chloride: 106 mEq/L (ref 96–112)
Creatinine, Ser: 0.83 mg/dL (ref 0.50–1.10)

## 2011-04-13 LAB — GLUCOSE, CAPILLARY: Glucose-Capillary: 174 mg/dL — ABNORMAL HIGH (ref 70–99)

## 2011-04-13 MED ORDER — CARVEDILOL 3.125 MG PO TABS
3.1250 mg | ORAL_TABLET | Freq: Two times a day (BID) | ORAL | Status: DC
  Administered 2011-04-13 – 2011-04-14 (×3): 3.125 mg via ORAL
  Filled 2011-04-13 (×5): qty 1

## 2011-04-13 NOTE — Progress Notes (Signed)
Gloria Joseph CSN:[id number],MRN:[id number] is a 70 y.o. female,  Outpatient Primary MD for the patient is Marga Melnick, MD, MD Chief Complaint  Patient presents with  . Near Syncope      04/13/2011   Subjective:   Gloria Joseph today has, No headache, No chest pain, No abdominal pain - No Nausea, No new weakness tingling or numbness, No Cough - SOB.   Objective:   Filed Vitals:   04/12/11 1858 04/12/11 2012 04/13/11 0027 04/13/11 0552  BP: 166/66 154/52 162/60 175/66  Pulse:  86  76  Temp:    99.2 F (37.3 C)  TempSrc:    Oral  Resp:    18  Height:      Weight:    77.8 kg (171 lb 8.3 oz)  SpO2:    93%   Wt Readings from Last 3 Encounters:  04/13/11 77.8 kg (171 lb 8.3 oz)  03/20/11 76.261 kg (168 lb 2 oz)  02/14/11 79.198 kg (174 lb 9.6 oz)    Intake/Output Summary (Last 24 hours) at 04/13/11 0953 Last data filed at 04/13/11 0700  Gross per 24 hour  Intake    240 ml  Output   2350 ml  Net  -2110 ml   Exam Awake Alert, Oriented *3, No new F.N deficits, Normal affect .AT,PERRAL Supple Neck,No JVD, No cervical lymphadenopathy appriciated.  Symmetrical Chest wall movement, Good air movement bilaterally, CTAB RRR,No Gallops,Rubs or new Murmurs, No Parasternal Heave +ve B.Sounds, Abd Soft, Non tender, No organomegaly appriciated, No rebound -guarding or rigidity. No Cyanosis, Clubbing or edema, No new Rash or bruise    Data Review  CBC  Lab 04/11/11 0530 04/10/11 2237  WBC 4.4 5.6  HGB 10.0* 11.4*  HCT 29.7* 32.5*  PLT 170 200  MCV 87.4 87.1  MCH 29.4 30.6  MCHC 33.7 35.1  RDW 13.1 13.1  LYMPHSABS 1.8 1.4  MONOABS 0.4 0.7  EOSABS 0.2 0.3  BASOSABS 0.1 0.1  BANDABS -- --    Lab 04/13/11 0630 04/12/11 0630 04/11/11 0530 04/10/11 2237  NA 139 141 141 135  K 3.5 3.6 3.5 3.7  CL 106 110 109 101  CO2 23 23 24 25   GLUCOSE 103* 109* 117* 129*  BUN 8 8 11 15   CREATININE 0.83 0.87 0.75 0.88  CALCIUM 9.1 8.9 8.7 9.4  MG 1.8 1.7 1.7 --    Lab 04/10/11  2237  INR 1.10  PROTIME --   Cardiac markers:   Lab 04/11/11 0115 04/10/11 2237  CKMB 2.1 2.5  TROPONINI <0.30 <0.30  MYOGLOBIN -- --    Lab 04/10/11 2237  POCBNP 741.1*   No results found for this or any previous visit (from the past 240 hour(s)).  Radiology Reports Dg Chest Port 1 View  04/10/2011  *RADIOLOGY REPORT*  Clinical Data: Near-syncopal episode, hypertension.  PORTABLE CHEST - 1 VIEW  Comparison: None.  Findings: Cardiomegaly.  No focal consolidation.  Mediastinal contours otherwise within normal limits.  No pneumothorax. Surgical clips right axilla.  No acute osseous abnormality.  IMPRESSION: Cardiomegaly without focal consolidation.  Original Report Authenticated By: Waneta Martins, M.D.   Scheduled Meds:    . aspirin  325 mg Oral Daily  . carvedilol  3.125 mg Oral BID WC  . gabapentin  300 mg Oral QHS  . hydrALAZINE  75 mg Oral Q6H  . isosorbide mononitrate  30 mg Oral Daily  . levothyroxine  112 mcg Oral QAC breakfast  . metoprolol      .  minoxidil  5 mg Oral Daily  . ondansetron      . pantoprazole  40 mg Oral Q1200  . promethazine  12.5 mg Intravenous Once  . simvastatin  20 mg Oral QHS  . spironolactone  25 mg Oral Daily  . DISCONTD: hydrALAZINE  50 mg Oral Q6H  . DISCONTD: minoxidil  2.5 mg Oral Daily  . DISCONTD: pantoprazole (PROTONIX) IV  40 mg Intravenous Q24H  . DISCONTD: spironolactone  25 mg Oral Daily   Continuous Infusions:   PRN Meds:.acetaminophen, cloNIDine, hydrALAZINE, HYDROcodone-acetaminophen, metoprolol  Assessment & Plan  Principal Problem:  1. Multiple Syncopal episodes - with Bradycardia but no documented Hypotension - had to DC home dose scheduled Clonidene and B blocker after D/W cards , has multiple drug allergies so limited options at this time, on low dose Minoxidil to Hydralazine with holding parameters, Tele, CT head noted, neuro Dr Irine Seal following along with Dr Ty Hilts cards following, D/W Dr Ty Hilts, have  add  aldactone per Dr Ty Hilts, H rate much improved, will add Low dose Coreg Po with parameters to hold and monitor for better BP control.Also has PRN catapres-Lopressor as H rate improved for ++BP, check renal Art [place] when tech available as pt says she has had several episodes of ++BP over last 6 mths.   Filed Vitals:   04/12/11 1858 04/12/11 2012 04/13/11 0027 04/13/11 0552  BP: 166/66 154/52 162/60 175/66  Pulse:  86  76  Temp:    99.2 F (37.3 C)  TempSrc:    Oral  Resp:    18  Height:      Weight:    77.8 kg (171 lb 8.3 oz)  SpO2:    93%    2.  DIABETES MELLITUS, TYPE II, CONTROLLED- continue monitoring stable control CBG (last 3)   Basename 04/13/11 0605 04/12/11 2258 04/12/11 1653  GLUCAP 103* 134* 123*    3. HYPERLIPIDEMIA - on statin.  4.  HYPERTENSION- As #1  5.  HYPOTHYROIDISM - TSH low, meds changed by PCP last week, outpt follow.  6.  Bradycardia - resolved after DC scheduled Catapres and B blocker. Monitor.   DVT Prophylaxis SCDs  See all Orders from today for further details

## 2011-04-13 NOTE — Progress Notes (Signed)
PT Discharge Note  Patient is being discharged from PT services secondary to:  Patient is independent in all mobility/ambulation.  Patient has ambulated with nursing with no assistive device x3 today. No acute PT needs identified - PT will sign off.  Discharge plan discussed with patient and she agrees.  Durenda Hurt Renaldo Fiddler, [facility] Acute Rehab Services Pager [phone]

## 2011-04-13 NOTE — Progress Notes (Signed)
Patient Name: Gloria Joseph    ASSESSMENT AND PLAN:   Patient Active Hospital Problem List: Syncopal episodes (04/11/2011)   Assessment: these very striking episodes are extremely brief and as such suggest a bradyarrhythmia. Her telemetry has demonstrated posttermination pausing of been a relatively short only about 2-2 and half seconds but this suggests a mechanism for these episodes. The diagnosis of atrial fibrillation is new. the associated rates are relatively rapid up to about 160 or so and bradycardia was noted all echocardiogram from November 22 at 47 beats per minute so tachybradycardia substrate is clearly present.     Plan: I think 2 options present themselves. The first would be to use it at recorder to clarify the mechanism of her presyncope/syncope with her next spell. I strongly suspect overjoyed posttermination pause. The other approach would be to implant a pacemaker for tachybradycardia syndrome as outlined above anticipating portable certainly take care of her presyncopal spells based on history.  I have reviewed the above with her. She is inclined towards pacing. I have discussed potential risks and benefits of former including but not limited to death perforation infection. She understands and would like to proceed tentatively. We will confirm this again in the morning.   Atrial fibrillation (04/13/2011)   Assessment: Atrial fibrillation is identified. These are relatively brief but have lasted as long as 1-2 minutes. She has a thromboembolic risk profile notable for age, hypertension, gender, and diabetes for a CHADS-VASc score of 4 and a Chads score of 2  Plan: As such she should receive a long term anticoagulation. We discussed Coumadin and alternatives. Neither does she proceed with pacing, we will defer initiation until she is undergone device implantation.  Alternative strategies for control of atrial flutter ablation were discussed. She has no symptoms his major  issue is control thromboembolic risk as noted above. The other issues if we could eliminate posttermination pausing  and tachycardia/bradycardia we might be able to avoid pacing. However, her QT interval precludes use of Tikosyn and all of the medications either are bradycardia inducing or like flecainide require AV nodal blocking agents concomitantly.  Bradycardia (04/11/2011)   Assessment: As above    Plan: As above   Q-T prolongation: As above   DIABETES MELLITUS, TYPE II, CONTROLLED (06/22/2007)   Assessment: As above    Plan: As above   HYPERTENSION (06/24/2006)   Assessment: This is been a major problem.   pacing will increase armamentarium of drugs available to treat her blood pressure    Plan: As above      SUBJECTIVE: Feeling pretty well. Denies chest pain or shortness of breath and has had no intercurrent lightheaded spells loss of vision spells and has had reportedly no palpitations  Past Medical History  Diagnosis Date  . Diabetes mellitus, type 2     diet controlled  . Hypertension     uncontrolled  . Osteopenia   . Hyperlipidemia   . Cancer     breast CA s/p Right mastectomy  . Hypothyroidism     s/p thryroid gland ablation  . Neuromuscular disorder   . Atrial fibrillation 04/13/2011    PHYSICAL EXAM Filed Vitals:   04/12/11 1858 04/12/11 2012 04/13/11 0027 04/13/11 0552  BP: 166/66 154/52 162/60 175/66  Pulse:  86  76  Temp:    99.2 F (37.3 C)  TempSrc:    Oral  Resp:    18  Height:      Weight:    171 lb 8.3  oz (77.8 kg)  SpO2:    93%    General appearance: alert, cooperative, appears stated age and no distress Neck: no carotid bruit, no JVD, supple, symmetrical, trachea midline and thyroid not enlarged, symmetric, no tenderness/mass/nodules Lungs: clear to auscultation bilaterally Heart: regular rate and rhythm, S1, S2 normal, no murmur, click, rub or gallop Abdomen: soft, non-tender; bowel sounds normal; no masses,  no organomegaly Extremities:  extremities normal, atraumatic, no cyanosis or edema Pulses: 2+ and symmetric Skin: Skin color, texture, turgor normal. No rashes or lesions Neurologic: Alert and oriented X 3, normal strength and tone. Normal symmetric reflexes. Normal coordination and gait  TELEMETRY: Reviewed telemetry pt in sinus rhythm, atrial fibrillation with a rapid rate, and posttermination pulse:    Intake/Output Summary (Last 24 hours) at 04/13/11 1044 Last data filed at 04/13/11 0700  Gross per 24 hour  Intake    240 ml  Output   2350 ml  Net  -2110 ml    LABS: Basic Metabolic Panel:  Lab 04/13/11 3086 04/12/11 0630 04/11/11 0530 04/10/11 2237  NA 139 141 141 135  K 3.5 3.6 3.5 3.7  CL 106 110 109 101  CO2 23 23 24 25   GLUCOSE 103* 109* 117* 129*  BUN 8 8 11 15   CREATININE 0.83 0.87 0.75 0.88  CALCIUM 9.1 8.9 -- --  MG 1.8 1.7 -- --  PHOS -- -- -- --   Cardiac Enzymes:  Basename 04/11/11 0115 04/10/11 2237  CKTOTAL 95 96  CKMB 2.1 2.5  CKMBINDEX -- --  TROPONINI <0.30 <0.30   CBC:  Lab 04/11/11 0530 04/10/11 2237  WBC 4.4 5.6  NEUTROABS 2.0 3.2  HGB 10.0* 11.4*  HCT 29.7* 32.5*  MCV 87.4 87.1  PLT 170 200   Liver Function Tests:  Basename 04/11/11 0530 04/10/11 2237  AST 16 19  ALT 9 13  ALKPHOS 61 79  BILITOT 0.3 0.3  PROT 6.0 6.6  ALBUMIN 3.0* 3.5    Basename 04/10/11 2237  TSH 0.010*  T4TOTAL --  T3FREE --  THYROIDAB --     Electrocardiogram as noted on 22 November demonstrated sinus rhythm at 47 Intervals 0.23/0.11/0.45 Nonspecific ST-T changes   Signed, Sherryl Manges MD  04/13/2011

## 2011-04-14 LAB — BASIC METABOLIC PANEL
Calcium: 9.4 mg/dL (ref 8.4–10.5)
GFR calc non Af Amer: 62 mL/min — ABNORMAL LOW (ref >90)
Glucose, Bld: 141 mg/dL — ABNORMAL HIGH (ref 70–99)
Sodium: 138 mEq/L (ref 135–145)

## 2011-04-14 LAB — CBC
MCHC: 33.7 g/dL (ref 30.0–36.0)
MCV: 88 fL (ref 78.0–100.0)
Platelets: 179 10*3/uL (ref 150–400)
RDW: 13.2 % (ref 11.5–15.5)
WBC: 6.8 10*3/uL (ref 4.0–10.5)

## 2011-04-14 LAB — GLUCOSE, CAPILLARY: Glucose-Capillary: 166 mg/dL — ABNORMAL HIGH (ref 70–99)

## 2011-04-14 LAB — MAGNESIUM: Magnesium: 1.9 mg/dL (ref 1.5–2.5)

## 2011-04-14 MED ORDER — LEVOTHYROXINE SODIUM 100 MCG PO TABS
100.0000 ug | ORAL_TABLET | Freq: Every day | ORAL | Status: DC
  Administered 2011-04-15: 100 ug via ORAL
  Filled 2011-04-14 (×2): qty 1

## 2011-04-14 MED ORDER — METOPROLOL TARTRATE 25 MG PO TABS
25.0000 mg | ORAL_TABLET | Freq: Three times a day (TID) | ORAL | Status: DC
  Administered 2011-04-14 – 2011-04-15 (×3): 25 mg via ORAL
  Filled 2011-04-14 (×6): qty 1

## 2011-04-14 NOTE — Progress Notes (Addendum)
Gloria Joseph CSN:[id number],MRN:[id number] is a 70 y.o. female,  Outpatient Primary MD for the patient is Marga Melnick, MD, MD  Chief Complaint  Patient presents with  . Near Syncope        Subjective:   Gloria Joseph today has, No headache, No chest pain, No abdominal pain - No Nausea, No new weakness tingling or numbness, No Cough - SOB.   Objective:   Filed Vitals:   04/13/11 1612 04/13/11 2105 04/13/11 2329 04/14/11 0634  BP: 133/69 136/74 139/85 147/66  Pulse: 87 80 96 94  Temp:  98.2 F (36.8 C)  98.3 F (36.8 C)  TempSrc:  Oral  Oral  Resp:  20  20  Height:      Weight:      SpO2:  97%  99%    Wt Readings from Last 3 Encounters:  04/13/11 77.8 kg (171 lb 8.3 oz)  03/20/11 76.261 kg (168 lb 2 oz)  02/14/11 79.198 kg (174 lb 9.6 oz)     Intake/Output Summary (Last 24 hours) at 04/14/11 0953 Last data filed at 04/14/11 0700  Gross per 24 hour  Intake    240 ml  Output   1400 ml  Net  -1160 ml    Exam Awake Alert, Oriented *3, No new F.N deficits, Normal affect [place].AT,PERRAL Supple Neck,No JVD, No cervical lymphadenopathy appriciated.  Symmetrical Chest wall movement, Good air movement bilaterally, CTAB RRR,No Gallops,Rubs or new Murmurs, No Parasternal Heave +ve B.Sounds, Abd Soft, Non tender, No organomegaly appriciated, No rebound -guarding or rigidity. No Cyanosis, Clubbing or edema, No new Rash or bruise    Data Review  CBC  Lab 04/14/11 0700 04/11/11 0530 04/10/11 2237  WBC 6.8 4.4 5.6  HGB 11.1* 10.0* 11.4*  HCT 32.9* 29.7* 32.5*  PLT 179 170 200  MCV 88.0 87.4 87.1  MCH 29.7 29.4 30.6  MCHC 33.7 33.7 35.1  RDW 13.2 13.1 13.1  LYMPHSABS -- 1.8 1.4  MONOABS -- 0.4 0.7  EOSABS -- 0.2 0.3  BASOSABS -- 0.1 0.1  BANDABS -- -- --    Chemistries   Lab 04/14/11 0700 04/13/11 0630 04/12/11 0630 04/11/11 0530 04/10/11 2237  NA 138 139 141 141 135  K 3.5 3.5 3.6 3.5 3.7  CL 105 106 110 109 101  CO2 22 23 23 24 25   GLUCOSE 141* 103* 109*  117* 129*  BUN 11 8 8 11 15   CREATININE 0.91 0.83 0.87 0.75 0.88  CALCIUM 9.4 9.1 8.9 8.7 9.4  MG 1.9 1.8 1.7 1.7 --  AST -- -- -- 16 19  ALT -- -- -- 9 13  ALKPHOS -- -- -- 61 79  BILITOT -- -- -- 0.3 0.3   estimated creatinine clearance is 61.8 ml/min (by C-G formula based on Cr of 0.91).  No results found for this basename: HGBA1C:2 in the last 72 hours  No results found for this basename: CHOL:2,HDL:2,LDLCALC:2,TRIG:2,CHOLHDL:2,LDLDIRECT:2 in the last 72 hours  No results found for this basename: TSH,T4TOTAL,FREET3,T3FREE,THYROIDAB in the last 72 hours  No results found for this basename: VITAMINB12:2,FOLATE:2,FERRITIN:2,TIBC:2,IRON:2,RETICCTPCT:2 in the last 72 hours  Coagulation profile  Lab 04/10/11 2237  INR 1.10  PROTIME --    No results found for this basename: DDIMER:2 in the last 72 hours  Cardiac Enzymes  Lab 04/11/11 0115 04/10/11 2237  CKMB 2.1 2.5  TROPONINI <0.30 <0.30  MYOGLOBIN -- --     Lab 04/10/11 2237  POCBNP 741.1*    Urine Studies No results found for  this basename: UACOL:2,UAPR:2,USPG:2,UPH:2,UTP:2,UGL:2,UKET:2,UBIL:2,UHGB:2,UNIT:2,UROB:2,ULEU:2,UEPI:2,UWBC:2,URBC:2,UBAC:2,CAST:2,CRYS:2,UCOM:2,BILUA:2 in the last 72 hours  Micro Results No results found for this or any previous visit (from the past 240 hour(s)).  Radiology Reports Ct Angio Head W/cm &/or Wo Cm  04/11/2011  *RADIOLOGY REPORT*  Clinical Data:  70 year old female with syncope.  History of breast cancer and right mastectomy.  CT ANGIOGRAPHY HEAD AND NECK  Technique:  Multidetector CT imaging of the head and neck was performed using the standard protocol during bolus administration of intravenous contrast.  Multiplanar CT image reconstructions including MIPs were obtained to evaluate the vascular anatomy. Carotid stenosis measurements (when applicable) are obtained utilizing NASCET criteria, using the distal internal carotid diameter as the denominator.  Contrast: 80mL OMNIPAQUE  IOHEXOL 350 MG/ML IV SOLN  Comparison:  Head CT without contrast 06/29/2004.  CTA NECK  Findings:  Negative lung apices.  Diminutive thyroid.  Negative larynx, pharynx, parapharyngeal spaces, retropharyngeal space, sublingual space, submandibular glands, parotid glands and orbits. Visualized paranasal sinuses and mastoids are clear.  Degenerative changes in the spine.  No suspicious osseous lesion identified. Poor dentition.  No lymphadenopathy.  Vascular Findings: Minimal aortic arch atherosclerosis.  Three- vessel arch configuration with widely patent great vessel origins.  Mild calcified atherosclerosis of the left subclavian artery is not hemodynamically significant.  Proximal right common carotid artery is tortuous but otherwise within normal limits.  Calcified atherosclerosis occurs at the right carotid bifurcation and extends into the posterior right ICA origin and bulb.  Subsequent stenosis is less than 50 % with respect to the distal vessel.  Otherwise negative cervical right ICA.  Normal right vertebral artery origin.  Tortuous but otherwise normal cervical right vertebral artery.  Tortuous proximal left common carotid artery.  Mild soft and calcified plaque at the left carotid bifurcation extending into the posterior lateral left ICA origin and bulb.  No cervical left ICA stenosis.   Tortuous but otherwise normal left vertebral artery origin. Mildly tortuous and otherwise normal cervical left vertebral artery.   Review of the MIP images confirms the above findings.  IMPRESSION:  1.  Mild carotid atherosclerosis with no significant arterial stenosis in the neck or at the thoracic inlet.  Tortuous great vessels. 2.  Poor dentition.  Recommend dental follow up. 3.  No other acute findings in the neck.  See intracranial findings below.  CTA HEAD  Findings:  Normal cerebral volume.  No ventriculomegaly.  Chronic cerebral white matter hypodensity is confluent and mildly progressed since 2006. No midline shift,  mass effect, or evidence of mass lesion.  No acute intracranial hemorrhage identified.  No abnormal enhancement identified.  No evidence of cortically based acute infarction identified.  Hyperostosis frontalis. No acute osseous abnormality identified.  Vascular Findings: Major intracranial venous structures are enhancing.  Minimal calcified atherosclerosis of the distal left vertebral artery which is dominant.  No subsequent left vertebral artery stenosis.  Normal PICA vessels.  Basilar artery is patent with mild irregularity but no stenosis.  Superior cerebellar arteries are within normal limits.  Normal PCA origins.  Diminutive posterior communicating arteries.  Bilateral PCA branches are within normal limits.  Mild bilateral ICA siphon calcified atherosclerosis resulting in no hemodynamically significant stenosis.  Normal ophthalmic and posterior communicating artery origins.  Patent carotid termini. Normal MCA and ACA origins.  Normal anterior communicating artery. ACA branches are within normal limits.  Bilateral MCA M1 segments are within normal limits.  Bilateral MCA branches are within normal limits.   Review of the MIP images confirms the above findings.  IMPRESSION: 1.  Minimal to mild calcified atherosclerosis at the skull base. Otherwise negative intracranial CTA without stenosis or major branch occlusion. 2.  Chronic nonspecific cerebral white matter disease with mild progression since 2006. 3. No acute intracranial abnormality.  Original Report Authenticated By: Harley Hallmark, M.D.   Ct Angio Neck W/cm &/or Wo/cm  04/11/2011  *RADIOLOGY REPORT*  Clinical Data:  70 year old female with syncope.  History of breast cancer and right mastectomy.  CT ANGIOGRAPHY HEAD AND NECK  Technique:  Multidetector CT imaging of the head and neck was performed using the standard protocol during bolus administration of intravenous contrast.  Multiplanar CT image reconstructions including MIPs were obtained to  evaluate the vascular anatomy. Carotid stenosis measurements (when applicable) are obtained utilizing NASCET criteria, using the distal internal carotid diameter as the denominator.  Contrast: 80mL OMNIPAQUE IOHEXOL 350 MG/ML IV SOLN  Comparison:  Head CT without contrast 06/29/2004.  CTA NECK  Findings:  Negative lung apices.  Diminutive thyroid.  Negative larynx, pharynx, parapharyngeal spaces, retropharyngeal space, sublingual space, submandibular glands, parotid glands and orbits. Visualized paranasal sinuses and mastoids are clear.  Degenerative changes in the spine.  No suspicious osseous lesion identified. Poor dentition.  No lymphadenopathy.  Vascular Findings: Minimal aortic arch atherosclerosis.  Three- vessel arch configuration with widely patent great vessel origins.  Mild calcified atherosclerosis of the left subclavian artery is not hemodynamically significant.  Proximal right common carotid artery is tortuous but otherwise within normal limits.  Calcified atherosclerosis occurs at the right carotid bifurcation and extends into the posterior right ICA origin and bulb.  Subsequent stenosis is less than 50 % with respect to the distal vessel.  Otherwise negative cervical right ICA.  Normal right vertebral artery origin.  Tortuous but otherwise normal cervical right vertebral artery.  Tortuous proximal left common carotid artery.  Mild soft and calcified plaque at the left carotid bifurcation extending into the posterior lateral left ICA origin and bulb.  No cervical left ICA stenosis.   Tortuous but otherwise normal left vertebral artery origin. Mildly tortuous and otherwise normal cervical left vertebral artery.   Review of the MIP images confirms the above findings.  IMPRESSION:  1.  Mild carotid atherosclerosis with no significant arterial stenosis in the neck or at the thoracic inlet.  Tortuous great vessels. 2.  Poor dentition.  Recommend dental follow up. 3.  No other acute findings in the neck.   See intracranial findings below.  CTA HEAD  Findings:  Normal cerebral volume.  No ventriculomegaly.  Chronic cerebral white matter hypodensity is confluent and mildly progressed since 2006. No midline shift, mass effect, or evidence of mass lesion.  No acute intracranial hemorrhage identified.  No abnormal enhancement identified.  No evidence of cortically based acute infarction identified.  Hyperostosis frontalis. No acute osseous abnormality identified.  Vascular Findings: Major intracranial venous structures are enhancing.  Minimal calcified atherosclerosis of the distal left vertebral artery which is dominant.  No subsequent left vertebral artery stenosis.  Normal PICA vessels.  Basilar artery is patent with mild irregularity but no stenosis.  Superior cerebellar arteries are within normal limits.  Normal PCA origins.  Diminutive posterior communicating arteries.  Bilateral PCA branches are within normal limits.  Mild bilateral ICA siphon calcified atherosclerosis resulting in no hemodynamically significant stenosis.  Normal ophthalmic and posterior communicating artery origins.  Patent carotid termini. Normal MCA and ACA origins.  Normal anterior communicating artery. ACA branches are within normal limits.  Bilateral MCA M1 segments are  within normal limits.  Bilateral MCA branches are within normal limits.   Review of the MIP images confirms the above findings.  IMPRESSION: 1.  Minimal to mild calcified atherosclerosis at the skull base. Otherwise negative intracranial CTA without stenosis or major branch occlusion. 2.  Chronic nonspecific cerebral white matter disease with mild progression since 2006. 3. No acute intracranial abnormality.  Original Report Authenticated By: Harley Hallmark, M.D.   Dg Chest Port 1 View  04/10/2011  *RADIOLOGY REPORT*  Clinical Data: Near-syncopal episode, hypertension.  PORTABLE CHEST - 1 VIEW  Comparison: None.  Findings: Cardiomegaly.  No focal consolidation.  Mediastinal  contours otherwise within normal limits.  No pneumothorax. Surgical clips right axilla.  No acute osseous abnormality.  IMPRESSION: Cardiomegaly without focal consolidation.  Original Report Authenticated By: Waneta Martins, M.D.   Echo  - Normal LV size and systolic function, EF 55%. Mild LV hypertrophy. Normal LV size and systolic function. No significant valvular abnormalities.    Scheduled Meds:   . aspirin  325 mg Oral Daily  . carvedilol  3.125 mg Oral BID WC  . gabapentin  300 mg Oral QHS  . hydrALAZINE  75 mg Oral Q6H  . isosorbide mononitrate  30 mg Oral Daily  . levothyroxine  112 mcg Oral QAC breakfast  . minoxidil  5 mg Oral Daily  . pantoprazole  40 mg Oral Q1200  . simvastatin  20 mg Oral QHS  . spironolactone  25 mg Oral Daily   Continuous Infusions:  PRN Meds:.acetaminophen, cloNIDine, hydrALAZINE, HYDROcodone-acetaminophen, metoprolol  Assessment & Plan   Principal Problem:  1. Multiple Syncopal episodes - due to Sick Sinus with paroxysmal Afib now - worsened by high dose Catapres and B Blocker combination- now better, monitor on tele, likely will get pacemaker and then Coumadin/Anticoagulation per Cards, cards following. Tolerating low dose Coreg continue. Cleared by Neuro Dr Irine Seal 04-14-11 DC EEG per him.  2.  DIABETES MELLITUS, TYPE II, CONTROLLED- continue monitoring stable control CBG (last 3)   Basename 04/14/11 0634 04/13/11 2048 04/13/11 1630  GLUCAP 150* 160* 134*     3. HYPERLIPIDEMIA - on statin.  4.  HYPERTENSION- multiple drug allergies + sick sinus left with limited options, better control on present Rx, once pacemaker placed will ++B Blocker and titrate other Meds.  5.  HYPOTHYROIDISM - TSH low, meds changed by PCP last week, outpt follow.  6.  Bradycardia - resolved after DC scheduled Catapres and B blocker. Monitor.   DVT Prophylaxis SCDs  See all Orders from today for further details

## 2011-04-14 NOTE — Progress Notes (Signed)
Patient Name: Gloria Joseph    ASSESSMENT AND PLAN:    Patient Active Hospital Problem List: Syncopal episodes (04/11/2011)   Assessment: no recurrent syncope   Plan: will ambulate and anticipate discharge in am with monitor.  Given the relatively rapid sinus rates will go a little more deliberately into pacing.   Atrial fibrillation (04/13/2011)   Assessment: recurrent paroxysms with RVR   Plan: stop carvedilol; try a differnent bradycardia  Lopressor tid Bradycardia (04/11/2011)   Assessment: better   Plan:  Will try and find the previous drugs that caused bradycardia-- could have been clonidine   QT prolongation (04/13/2011)   Assessment: stable   Plan: precludes cklass 3   HYPERTENSION (06/24/2006)   Assessment: better, but I wonder if mioxidil and hydralazine with side effect profile is the best option   Plan: defer to PCP and primary team HYPOTHYROIDISM (07/04/2010)   Assessment: tsh suppressed    Plan: decrease synthroid      SUBJECTIVE:feels better     PHYSICAL EXAM Filed Vitals:   04/13/11 1612 04/13/11 2105 04/13/11 2329 04/14/11 0634  BP: 133/69 136/74 139/85 147/66  Pulse: 87 80 96 94  Temp:  98.2 F (36.8 C)  98.3 F (36.8 C)  TempSrc:  Oral  Oral  Resp:  20  20  Height:      Weight:      SpO2:  97%  99%    General appearance: alert, cooperative, appears stated age and no distress Neck: no carotid bruit and no JVD Lungs: clear to auscultation bilaterally Heart: S1, S2 normal, S4 present and systolic murmur: early systolic 2/6, medium pitch at 2nd right intercostal space Abdomen: soft, non-tender; bowel sounds normal; no masses,  no organomegaly Extremities: extremities normal, atraumatic, no cyanosis or edema Pulses: 2+ and symmetric Skin: Skin color, texture, turgor normal. No rashes or lesions Neurologic: Alert and oriented X 3, normal strength and tone. Normal symmetric reflexes. Normal coordination and gait  TELEMETRY: Reviewed telemetry pt  in afib with sinus no significant pauses:    Intake/Output Summary (Last 24 hours) at 04/14/11 1015 Last data filed at 04/14/11 0700  Gross per 24 hour  Intake    240 ml  Output   1400 ml  Net  -1160 ml    LABS: Basic Metabolic Panel:  Lab 04/14/11 6962 04/13/11 0630 04/12/11 0630 04/11/11 0530 04/10/11 2237  NA 138 139 141 141 135  K 3.5 3.5 3.6 3.5 3.7  CL 105 106 110 109 101  CO2 22 23 23 24 25   GLUCOSE 141* 103* 109* 117* 129*  BUN 11 8 8 11 15   CREATININE 0.91 0.83 0.87 0.75 0.88  CALCIUM 9.4 9.1 -- -- --  MG 1.9 1.8 -- -- --  PHOS -- -- -- -- --   Cardiac Enzymes: No results found for this basename: CKTOTAL:3,CKMB:3,CKMBINDEX:3,TROPONINI:3 in the last 72 hours CBC:  Lab 04/14/11 0700 04/11/11 0530 04/10/11 2237  WBC 6.8 4.4 5.6  NEUTROABS -- 2.0 3.2  HGB 11.1* 10.0* 11.4*  HCT 32.9* 29.7* 32.5*  MCV 88.0 87.4 87.1  PLT 179 170 200      Signed, Sherryl Manges MD  04/14/2011

## 2011-04-15 ENCOUNTER — Ambulatory Visit (HOSPITAL_COMMUNITY): Payer: PRIVATE HEALTH INSURANCE

## 2011-04-15 LAB — BASIC METABOLIC PANEL
CO2: 24 mEq/L (ref 19–32)
Calcium: 9 mg/dL (ref 8.4–10.5)
Chloride: 106 mEq/L (ref 96–112)
Glucose, Bld: 135 mg/dL — ABNORMAL HIGH (ref 70–99)
Sodium: 139 mEq/L (ref 135–145)

## 2011-04-15 LAB — GLUCOSE, CAPILLARY: Glucose-Capillary: 140 mg/dL — ABNORMAL HIGH (ref 70–99)

## 2011-04-15 MED ORDER — METOPROLOL TARTRATE 25 MG PO TABS: 25.0000 mg | ORAL_TABLET | Freq: Two times a day (BID) | ORAL | Status: DC

## 2011-04-15 MED ORDER — SPIRONOLACTONE 25 MG PO TABS: 25.0000 mg | ORAL_TABLET | Freq: Every day | ORAL | Status: DC

## 2011-04-15 MED ORDER — RIVAROXABAN 10 MG PO TABS
20.0000 mg | ORAL_TABLET | Freq: Every day | ORAL | Status: DC
  Filled 2011-04-15: qty 2

## 2011-04-15 MED ORDER — MINOXIDIL 2.5 MG PO TABS: 5.0000 mg | ORAL_TABLET | Freq: Every day | ORAL | Status: DC

## 2011-04-15 MED ORDER — HYDRALAZINE HCL 50 MG PO TABS: 75.0000 mg | ORAL_TABLET | Freq: Four times a day (QID) | ORAL | Status: DC

## 2011-04-15 MED ORDER — RIVAROXABAN 20 MG PO TABS: 20.0000 mg | ORAL_TABLET | Freq: Every day | ORAL | Status: DC

## 2011-04-15 MED ORDER — ISOSORBIDE MONONITRATE ER 30 MG PO TB24: 30.0000 mg | ORAL_TABLET | Freq: Every day | ORAL | Status: DC

## 2011-04-15 NOTE — Progress Notes (Signed)
SUBJECTIVE: The patient is doing well today.  At this time, she denies chest pain, shortness of breath, or any new concerns.  She feels "very good" and wants to go home.    Marland Kitchen aspirin  325 mg Oral Daily  . gabapentin  300 mg Oral QHS  . hydrALAZINE  75 mg Oral Q6H  . isosorbide mononitrate  30 mg Oral Daily  . levothyroxine  100 mcg Oral QAC breakfast  . metoprolol tartrate  25 mg Oral Q8H  . minoxidil  5 mg Oral Daily  . pantoprazole  40 mg Oral Q1200  . simvastatin  20 mg Oral QHS  . spironolactone  25 mg Oral Daily  . DISCONTD: carvedilol  3.125 mg Oral BID WC  . DISCONTD: levothyroxine  112 mcg Oral QAC breakfast      OBJECTIVE: Physical Exam: Filed Vitals:   04/14/11 1500 04/14/11 2117 04/15/11 0416 04/15/11 0553  BP: 111/63 136/57 133/59 143/74  Pulse: 95 85 67 88  Temp: 98 F (36.7 C) 98.1 F (36.7 C) 98.2 F (36.8 C)   TempSrc: Oral Oral Oral   Resp: 18 18 18    Height:      Weight:      SpO2: 97% 97% 98%     Intake/Output Summary (Last 24 hours) at 04/15/11 0744 Last data filed at 04/15/11 0400  Gross per 24 hour  Intake      0 ml  Output    800 ml  Net   -800 ml    Telemetry reveals sinus rhythm with nonsustained afib GEN- The patient is well appearing, alert and oriented x 3 today.   Head- normocephalic, atraumatic Eyes-  Sclera clear, conjunctiva pink Ears- hearing intact Oropharynx- clear Neck- supple, no JVP Lymph- no cervical lymphadenopathy Lungs- Clear to ausculation bilaterally, normal work of breathing Heart- Regular rate and rhythm, no murmurs, rubs or gallops, PMI not laterally displaced GI- soft, NT, ND, + BS Extremities- no clubbing, cyanosis, or edema Skin- no rash or lesion Psych- euthymic mood, full affect Neuro- strength and sensation are intact  LABS: Basic Metabolic Panel:  Basename 04/15/11 0600 04/14/11 0700  NA 139 138  K 3.9 3.5  CL 106 105  CO2 24 22  GLUCOSE 135* 141*  BUN 15 11  CREATININE 1.03 0.91  CALCIUM 9.0  9.4  MG 1.8 1.9  PHOS -- --   CBC:  Basename 04/14/11 0700  WBC 6.8  NEUTROABS --  HGB 11.1*  HCT 32.9*  MCV 88.0  PLT 179  Dg Chest Port 1 View  ASSESSMENT AND PLAN:  1. Presyncope- improved  Adequate blood pressure control recommended long term  21 day event monitor at discharge  No driving until follow-up in cardiology office 2. Hypertension- improved  Continue current medications at discharge 3. Tachy/brady-  Stable  We will not plan PPM at this time,  Event monitor and outpatient follow-up 4.  Atrial fibrillation- CHADS2 score is 2.  Will start Xarelto 20mg  daily (CrCl is 64)  OK to discharge from CV standpoint Follow-up with me in 4-6 weeks   Hillis Range, MD 04/15/2011 7:44 AM

## 2011-04-15 NOTE — Progress Notes (Signed)
Event monitor ordered at [facility].  Office will call patient to put on.  Follow up appointment with Dr Johney Frame on May 28, 2010 at 2 PM.  Patient given appointment information.

## 2011-04-15 NOTE — Progress Notes (Signed)
Patient ambulated 1500 feet on room air independently. Patient's heart rate remained within normal limits. Patient had a steady gait and tolerated the ambulation well. Will continue to monitor.

## 2011-04-15 NOTE — Discharge Summary (Signed)
Gloria Joseph, 70 y.o., DOB 1941/05/07, MRN [id number]. Admission date: 04/10/2011 Discharge Date 04/15/2011 Primary MD Marga Melnick, MD, MD Admitting Physician Leroy Sea, MD  Admission Diagnosis  Near syncope [780.2] NEAR SYNCOPE  Discharge Diagnosis   Principal Problem:  *Syncopal episodes Active Problems:  DIABETES MELLITUS, TYPE II, CONTROLLED  HYPERLIPIDEMIA  HYPERTENSION  HYPOTHYROIDISM  Bradycardia  Atrial fibrillation  QT prolongation    Past Medical History  Diagnosis Date  . Diabetes mellitus, type 2     diet controlled  . Hypertension     uncontrolled  . Osteopenia   . Hyperlipidemia   . Cancer     breast CA s/p Right mastectomy  . Hypothyroidism     s/p thryroid gland ablation  . Neuromuscular disorder   . Atrial fibrillation 04/13/2011    Past Surgical History  Procedure Date  . Breast enhancement surgery   . Breast implant removal 2000    Unilaterally   . Cholecystectomy   . Colonoscopy 2002    Neg  . Rai 04/04/2006  . Mastectomy   . Tubal ligation   . Breast surgery     Consults Neurology-Bizov, Cardio Allred  Significant Tests:  See full reports for all details    Echo  - Normal LV size and systolic function, EF 55%. Mild LV hypertrophy. Normal LV size and systolic function. No significant valvular abnormalities.       Ct Angio Head W/cm &/or Wo Cm  04/11/2011  *RADIOLOGY REPORT*  Clinical Data:  70 year old female with syncope.  History of breast cancer and right mastectomy.  CT ANGIOGRAPHY HEAD AND NECK  Technique:  Multidetector CT imaging of the head and neck was performed using the standard protocol during bolus administration of intravenous contrast.  Multiplanar CT image reconstructions including MIPs were obtained to evaluate the vascular anatomy. Carotid stenosis measurements (when applicable) are obtained utilizing NASCET criteria, using the distal internal carotid diameter as the denominator.  Contrast: 80mL  OMNIPAQUE IOHEXOL 350 MG/ML IV SOLN  Comparison:  Head CT without contrast 06/29/2004.  CTA NECK  Findings:  Negative lung apices.  Diminutive thyroid.  Negative larynx, pharynx, parapharyngeal spaces, retropharyngeal space, sublingual space, submandibular glands, parotid glands and orbits. Visualized paranasal sinuses and mastoids are clear.  Degenerative changes in the spine.  No suspicious osseous lesion identified. Poor dentition.  No lymphadenopathy.  Vascular Findings: Minimal aortic arch atherosclerosis.  Three- vessel arch configuration with widely patent great vessel origins.  Mild calcified atherosclerosis of the left subclavian artery is not hemodynamically significant.  Proximal right common carotid artery is tortuous but otherwise within normal limits.  Calcified atherosclerosis occurs at the right carotid bifurcation and extends into the posterior right ICA origin and bulb.  Subsequent stenosis is less than 50 % with respect to the distal vessel.  Otherwise negative cervical right ICA.  Normal right vertebral artery origin.  Tortuous but otherwise normal cervical right vertebral artery.  Tortuous proximal left common carotid artery.  Mild soft and calcified plaque at the left carotid bifurcation extending into the posterior lateral left ICA origin and bulb.  No cervical left ICA stenosis.   Tortuous but otherwise normal left vertebral artery origin. Mildly tortuous and otherwise normal cervical left vertebral artery.   Review of the MIP images confirms the above findings.  IMPRESSION:  1.  Mild carotid atherosclerosis with no significant arterial stenosis in the neck or at the thoracic inlet.  Tortuous great vessels. 2.  Poor dentition.  Recommend dental follow up.  3.  No other acute findings in the neck.  See intracranial findings below.  CTA HEAD  Findings:  Normal cerebral volume.  No ventriculomegaly.  Chronic cerebral white matter hypodensity is confluent and mildly progressed since 2006. No  midline shift, mass effect, or evidence of mass lesion.  No acute intracranial hemorrhage identified.  No abnormal enhancement identified.  No evidence of cortically based acute infarction identified.  Hyperostosis frontalis. No acute osseous abnormality identified.  Vascular Findings: Major intracranial venous structures are enhancing.  Minimal calcified atherosclerosis of the distal left vertebral artery which is dominant.  No subsequent left vertebral artery stenosis.  Normal PICA vessels.  Basilar artery is patent with mild irregularity but no stenosis.  Superior cerebellar arteries are within normal limits.  Normal PCA origins.  Diminutive posterior communicating arteries.  Bilateral PCA branches are within normal limits.  Mild bilateral ICA siphon calcified atherosclerosis resulting in no hemodynamically significant stenosis.  Normal ophthalmic and posterior communicating artery origins.  Patent carotid termini. Normal MCA and ACA origins.  Normal anterior communicating artery. ACA branches are within normal limits.  Bilateral MCA M1 segments are within normal limits.  Bilateral MCA branches are within normal limits.   Review of the MIP images confirms the above findings.  IMPRESSION: 1.  Minimal to mild calcified atherosclerosis at the skull base. Otherwise negative intracranial CTA without stenosis or major branch occlusion. 2.  Chronic nonspecific cerebral white matter disease with mild progression since 2006. 3. No acute intracranial abnormality.  Original Report Authenticated By: Harley Hallmark, M.D.   Ct Angio Neck W/cm &/or Wo/cm  04/11/2011  *RADIOLOGY REPORT*  Clinical Data:  70 year old female with syncope.  History of breast cancer and right mastectomy.  CT ANGIOGRAPHY HEAD AND NECK  Technique:  Multidetector CT imaging of the head and neck was performed using the standard protocol during bolus administration of intravenous contrast.  Multiplanar CT image reconstructions including MIPs were  obtained to evaluate the vascular anatomy. Carotid stenosis measurements (when applicable) are obtained utilizing NASCET criteria, using the distal internal carotid diameter as the denominator.  Contrast: 80mL OMNIPAQUE IOHEXOL 350 MG/ML IV SOLN  Comparison:  Head CT without contrast 06/29/2004.  CTA NECK  Findings:  Negative lung apices.  Diminutive thyroid.  Negative larynx, pharynx, parapharyngeal spaces, retropharyngeal space, sublingual space, submandibular glands, parotid glands and orbits. Visualized paranasal sinuses and mastoids are clear.  Degenerative changes in the spine.  No suspicious osseous lesion identified. Poor dentition.  No lymphadenopathy.  Vascular Findings: Minimal aortic arch atherosclerosis.  Three- vessel arch configuration with widely patent great vessel origins.  Mild calcified atherosclerosis of the left subclavian artery is not hemodynamically significant.  Proximal right common carotid artery is tortuous but otherwise within normal limits.  Calcified atherosclerosis occurs at the right carotid bifurcation and extends into the posterior right ICA origin and bulb.  Subsequent stenosis is less than 50 % with respect to the distal vessel.  Otherwise negative cervical right ICA.  Normal right vertebral artery origin.  Tortuous but otherwise normal cervical right vertebral artery.  Tortuous proximal left common carotid artery.  Mild soft and calcified plaque at the left carotid bifurcation extending into the posterior lateral left ICA origin and bulb.  No cervical left ICA stenosis.   Tortuous but otherwise normal left vertebral artery origin. Mildly tortuous and otherwise normal cervical left vertebral artery.   Review of the MIP images confirms the above findings.  IMPRESSION:  1.  Mild carotid atherosclerosis with no significant  arterial stenosis in the neck or at the thoracic inlet.  Tortuous great vessels. 2.  Poor dentition.  Recommend dental follow up. 3.  No other acute findings in  the neck.  See intracranial findings below.  CTA HEAD  Findings:  Normal cerebral volume.  No ventriculomegaly.  Chronic cerebral white matter hypodensity is confluent and mildly progressed since 2006. No midline shift, mass effect, or evidence of mass lesion.  No acute intracranial hemorrhage identified.  No abnormal enhancement identified.  No evidence of cortically based acute infarction identified.  Hyperostosis frontalis. No acute osseous abnormality identified.  Vascular Findings: Major intracranial venous structures are enhancing.  Minimal calcified atherosclerosis of the distal left vertebral artery which is dominant.  No subsequent left vertebral artery stenosis.  Normal PICA vessels.  Basilar artery is patent with mild irregularity but no stenosis.  Superior cerebellar arteries are within normal limits.  Normal PCA origins.  Diminutive posterior communicating arteries.  Bilateral PCA branches are within normal limits.  Mild bilateral ICA siphon calcified atherosclerosis resulting in no hemodynamically significant stenosis.  Normal ophthalmic and posterior communicating artery origins.  Patent carotid termini. Normal MCA and ACA origins.  Normal anterior communicating artery. ACA branches are within normal limits.  Bilateral MCA M1 segments are within normal limits.  Bilateral MCA branches are within normal limits.   Review of the MIP images confirms the above findings.  IMPRESSION: 1.  Minimal to mild calcified atherosclerosis at the skull base. Otherwise negative intracranial CTA without stenosis or major branch occlusion. 2.  Chronic nonspecific cerebral white matter disease with mild progression since 2006. 3. No acute intracranial abnormality.  Original Report Authenticated By: Harley Hallmark, M.D.   Dg Chest Port 1 View  04/10/2011  *RADIOLOGY REPORT*  Clinical Data: Near-syncopal episode, hypertension.  PORTABLE CHEST - 1 VIEW  Comparison: None.  Findings: Cardiomegaly.  No focal consolidation.   Mediastinal contours otherwise within normal limits.  No pneumothorax. Surgical clips right axilla.  No acute osseous abnormality.  IMPRESSION: Cardiomegaly without focal consolidation.  Original Report Authenticated By: Waneta Martins, M.D.    Hospital Course See H&P, Labs, Consult and Test reports for all details in brief, patient was admitted for  1. Multiple Syncopal episodes - Resting Bradycardia (with high supine BP), New Paroxysmal Afib  - worsened by high dose Catapres and B Blocker combination - now better, was stable on tele, Cleared by Neuro Dr Irine Seal 04-14-11 from neuro standpoint, stable Nero workup. Seen By Dr Johney Frame cardio who has put her on PO Anticoagulant along with below given Heart and BP Meds including lopressor and Aldactone, patient now symptom free will follow with PCP in 1 week and Dr Johney Frame soon, she will get Event Monitor and Home PT.   Bradycardia resolved and BP now in good control.   2. H/O ? DIABETES MELLITUS, TYPE II, CONTROLLED-   stable , outpt monitor, doubt she will need any Meds anytime soon.  CBG (last 3)   Basename  04/14/11 0634  04/13/11 2048  04/13/11 1630   GLUCAP  150*  160*  134*    3. HYPERLIPIDEMIA - on statin.   4. HYPERTENSION- multiple drug allergies + sick sinus left with limited options, better control on present Rx, once pacemaker placed will ++B Blocker and titrate other Meds.   5. HYPOTHYROIDISM - TSH low, meds changed by PCP last week, outpt follow.   6. Bradycardia - resolved after DC scheduled Catapres and B blocker combination. Monitor.  Today  Subjective:   Aixa Corsello today has no headache,no chest abdominal pain,no new weakness tingling or numbness, feels much better wants to go home today.    Objective:   Blood pressure 143/74, pulse 88, temperature 98.2 F (36.8 C), temperature source Oral, resp. rate 18, height 5\' 7"  (1.702 m), weight 77.8 kg (171 lb 8.3 oz), SpO2 98.00%.  Intake/Output Summary (Last 24  hours) at 04/15/11 1136 Last data filed at 04/15/11 0900  Gross per 24 hour  Intake    360 ml  Output   1600 ml  Net  -1240 ml    Exam Awake Alert, Oriented *3, No new F.N deficits, Normal affect Jenkins.AT,PERRAL Supple Neck,No JVD, No cervical lymphadenopathy appriciated.  Symmetrical Chest wall movement, Good air movement bilaterally, CTAB RRR,No Gallops,Rubs or new Murmurs, No Parasternal Heave +ve B.Sounds, Abd Soft, Non tender, No organomegaly appriciated, No rebound -guarding or rigidity. No Cyanosis, Clubbing or edema, No new Rash or bruise  Data Review    CBC w Diff: Lab Results  Component Value Date   WBC 6.8 04/14/2011   HGB 11.1* 04/14/2011   HCT 32.9* 04/14/2011   PLT 179 04/14/2011   LYMPHOPCT 40 04/11/2011   MONOPCT 8 04/11/2011   EOSPCT 5 04/11/2011   BASOPCT 1 04/11/2011   CMP: Lab Results  Component Value Date   NA 139 04/15/2011   K 3.9 04/15/2011   CL 106 04/15/2011   CO2 24 04/15/2011   BUN 15 04/15/2011   CREATININE 1.03 04/15/2011   PROT 6.0 04/11/2011   ALBUMIN 3.0* 04/11/2011   BILITOT 0.3 04/11/2011   ALKPHOS 61 04/11/2011   AST 16 04/11/2011   ALT 9 04/11/2011  . Discharge Instructions     Follow with Primary MD Marga Melnick, MD, MD in 7 days   Get CBC, CMP, checked 7 days by Primary MD and again as instructed by your Primary MD. Get a 2 view Chest X ray done next visit if you had Pneumonia of Lung problems at the Hospital.  Please follow with your Dentist in 2 weeks.  Get Medicines reviewed and adjusted.  Please request your Prim.MD to go over all Hospital Tests and Procedure/Radiological results at the follow up, please get all Hospital records sent to your Prim MD by signing hospital release before you go home.  Activity: Fall precautions use walker/cane & assistance as needed  Diet: Cardiac   For Heart failure patients - Check your Weight same time everyday, if you gain over 2 pounds, or you develop in leg swelling, experience  more shortness of breath or chest pain, call your Primary MD immediately. Follow Cardiac Low Salt Diet and 1.8 lit/day fluid restriction.  Disposition Home  If you experience worsening of your admission symptoms, develop shortness of breath, life threatening emergency, suicidal or homicidal thoughts you must seek medical attention immediately by calling 911 or calling your MD immediately  if symptoms less severe.  You Must read complete instructions/literature along with all the possible adverse reactions/side effects for all the Medicines you take and that have been prescribed to you. Take any new Medicines after you have completely understood and accpet all the possible adverse reactions/side effects.   Do not drive until you have seen by Primary MD or Your Heart Doctort and advised to drive.  Do not drive when taking Pain medications.    Do not take more than prescribed Pain, Sleep and Anxiety Medications  Special Instructions: If you have smoked or chewed Tobacco  in the  last 2 yrs please stop smoking, stop any regular Alcohol  and or any Recreational drug use.  Wear Seat belts while driving.   Follow-up Information    Follow up with Marga Melnick, MD. Make an appointment in 2 days.   Contact information:   [address] [address] [place] [address] [phone]       Follow up with Hillis Range, MD. Make an appointment in 2 weeks.   Contact information:   [address], [address] [place] [address] [phone]          Discharge Medications   Medication List  As of 04/15/2011 11:36 AM   START taking these medications         hydrALAZINE 50 MG tablet   Commonly known as: APRESOLINE   Take 1.5 tablets (75 mg total) by mouth every 6 (six) hours.      isosorbide mononitrate 30 MG 24 hr tablet   Commonly known as: IMDUR   Take 1 tablet (30 mg total) by mouth daily.      metoprolol tartrate 25 MG tablet   Commonly  known as: LOPRESSOR   Take 1 tablet (25 mg total) by mouth 2 (two) times daily.      minoxidil 2.5 MG tablet   Commonly known as: LONITEN   Take 2 tablets (5 mg total) by mouth daily.      Rivaroxaban 20 MG Tabs   Take 20 mg by mouth daily with supper.      spironolactone 25 MG tablet   Commonly known as: ALDACTONE   Take 1 tablet (25 mg total) by mouth daily.         CONTINUE taking these medications         gabapentin 300 MG capsule   Commonly known as: NEURONTIN      levothyroxine 112 MCG tablet   Commonly known as: SYNTHROID, LEVOTHROID      simvastatin 20 MG tablet   Commonly known as: ZOCOR         STOP taking these medications         carvedilol 25 MG tablet      cloNIDine 0.2 MG tablet          Where to get your medications    These are the prescriptions that you need to pick up.   You may get these medications from any pharmacy.         hydrALAZINE 50 MG tablet   isosorbide mononitrate 30 MG 24 hr tablet   metoprolol tartrate 25 MG tablet   minoxidil 2.5 MG tablet   Rivaroxaban 20 MG Tabs   spironolactone 25 MG tablet            Total Time in preparing paper work, data evaluation and todays exam - 35 minutes  Signed: Leroy Sea 04/15/2011 11:36 AM  The patient was admitted to the Hospitalist Service.

## 2011-04-16 ENCOUNTER — Telehealth: Payer: Self-pay

## 2011-04-16 NOTE — Telephone Encounter (Signed)
Patient called back for appt. For monitor.

## 2011-04-17 ENCOUNTER — Encounter (INDEPENDENT_AMBULATORY_CARE_PROVIDER_SITE_OTHER): Payer: Medicare Other

## 2011-04-17 DIAGNOSIS — R55 Syncope and collapse: Secondary | ICD-10-CM

## 2011-04-18 ENCOUNTER — Ambulatory Visit: Payer: PRIVATE HEALTH INSURANCE | Admitting: Internal Medicine

## 2011-04-18 ENCOUNTER — Telehealth: Payer: Self-pay | Admitting: Physician Assistant

## 2011-04-18 NOTE — Telephone Encounter (Signed)
Received notification from [facility] that patient had had an episode of atrial fib rates 60s-90s. I attempted to call patient x2 but got no response. It does not appear that we have seen this patient; was dc'd from the hospital recently and monitor placed. Will forward to [facility] in our office as well as Primary MD. Will need f/u appt.

## 2011-04-19 NOTE — Telephone Encounter (Signed)
She should be seen with all meds to assess best treatment for intermittent heart rhythm irregularities seen on Holter monitor

## 2011-04-19 NOTE — Telephone Encounter (Addendum)
Left message to call office

## 2011-04-19 NOTE — Telephone Encounter (Signed)
Pt return call appt. scheduled

## 2011-04-22 ENCOUNTER — Encounter: Payer: Self-pay | Admitting: Internal Medicine

## 2011-04-22 ENCOUNTER — Other Ambulatory Visit: Payer: Medicare Other

## 2011-04-22 ENCOUNTER — Ambulatory Visit (INDEPENDENT_AMBULATORY_CARE_PROVIDER_SITE_OTHER): Payer: Medicare Other | Admitting: Internal Medicine

## 2011-04-22 DIAGNOSIS — I4891 Unspecified atrial fibrillation: Secondary | ICD-10-CM

## 2011-04-22 DIAGNOSIS — E039 Hypothyroidism, unspecified: Secondary | ICD-10-CM

## 2011-04-22 DIAGNOSIS — E785 Hyperlipidemia, unspecified: Secondary | ICD-10-CM

## 2011-04-22 DIAGNOSIS — R55 Syncope and collapse: Secondary | ICD-10-CM

## 2011-04-22 DIAGNOSIS — I10 Essential (primary) hypertension: Secondary | ICD-10-CM

## 2011-04-22 NOTE — Patient Instructions (Signed)
Your BP goal = AVERAGE < 135/85. Avoid ingestion of  excess salt/sodium.Cook with pepper & other spices . Use the salt substitute "No Salt"(unless your potassium has been elevated) OR the Mrs Dash products to season food @ the table. Avoid foods which taste salty or "vinegary" as their sodium contentet will be high.  

## 2011-04-22 NOTE — Progress Notes (Signed)
Addended by: Edgardo Roys on: 04/22/2011 05:03 PM   Modules accepted: Orders

## 2011-04-22 NOTE — Progress Notes (Signed)
  Subjective:    Patient ID: Gloria Joseph, female    DOB: 26-Aug-1940, 70 y.o.   MRN: [id number]  HPI She was hospitalized for evaluation of repeated near syncope 11/21. Hospital records were reviewed. Cardiac enzymes were negative, but the BNP was 741. She was found to be markedly hypertensive at the time of admission, blood pressure has been controlled since discharge according to her. She's had no more episodes of near syncope. She has been seen by Dr. Graciela Husbands who discussed possibly placing a pacemaker. She has appt with Dr Johney Frame 05/29/11. Xarelto was Rxed ; this was started 12/1.  She is wearing an event monitor; on 11/29 there was a run of atrial fibrillation in the 60s to 90s.    Review of Systems     Objective:   Physical Exam  She appears healthy and well-nourished in no acute distress  She has a grade 1.5 systolic murmur at the base with radiation into the carotids.  Her chest is clear at the bases with no rales or rhonchi.  She does have trace to 1/2+ pedal edema.  All pulses are intact.          Assessment & Plan:  #1 near syncope; most likely due to intermittent dysrhythmias. Slow atrial fibrillation has been dieting most on outpatient monitor. Xarelto initiated  #2 hypothyroidism; status post recent adjustment of thyroid dose. TSH should be monitored at 8-10 week intervals.

## 2011-04-23 ENCOUNTER — Telehealth: Payer: Self-pay | Admitting: Internal Medicine

## 2011-04-23 NOTE — Telephone Encounter (Signed)
Patient was seen [id number] - she wants meds that were given at the hospital which dr hopper had list - [facility] 3 month supply

## 2011-04-24 ENCOUNTER — Other Ambulatory Visit (INDEPENDENT_AMBULATORY_CARE_PROVIDER_SITE_OTHER): Payer: Medicare Other

## 2011-04-24 DIAGNOSIS — I10 Essential (primary) hypertension: Secondary | ICD-10-CM

## 2011-04-24 DIAGNOSIS — E039 Hypothyroidism, unspecified: Secondary | ICD-10-CM

## 2011-04-24 DIAGNOSIS — E785 Hyperlipidemia, unspecified: Secondary | ICD-10-CM

## 2011-04-24 LAB — BASIC METABOLIC PANEL
BUN: 9 mg/dL (ref 6–23)
CO2: 26 mEq/L (ref 19–32)
Chloride: 112 mEq/L (ref 96–112)
GFR: 61.64 mL/min (ref >60.00)
Glucose, Bld: 111 mg/dL — ABNORMAL HIGH (ref 70–99)
Potassium: 4.3 mEq/L (ref 3.5–5.1)

## 2011-04-24 LAB — HEPATIC FUNCTION PANEL
Albumin: 3.3 g/dL — ABNORMAL LOW (ref 3.5–5.2)
Total Protein: 6.4 g/dL (ref 6.0–8.3)

## 2011-04-24 LAB — CBC WITH DIFFERENTIAL/PLATELET
Basophils Absolute: 0 10*3/uL (ref 0.0–0.1)
Eosinophils Absolute: 0.3 10*3/uL (ref 0.0–0.7)
HCT: 28.3 % — ABNORMAL LOW (ref 36.0–46.0)
Hemoglobin: 9.5 g/dL — ABNORMAL LOW (ref 12.0–15.0)
Lymphs Abs: 0.8 10*3/uL (ref 0.7–4.0)
MCHC: 33.5 g/dL (ref 30.0–36.0)
MCV: 91.3 fl (ref 78.0–100.0)
Monocytes Absolute: 0.5 10*3/uL (ref 0.1–1.0)
Neutro Abs: 3.6 10*3/uL (ref 1.4–7.7)
Platelets: 303 10*3/uL (ref 150.0–400.0)
RDW: 14.3 % (ref 11.5–14.6)

## 2011-04-24 LAB — TSH: TSH: 0.07 u[IU]/mL — ABNORMAL LOW (ref 0.35–5.50)

## 2011-04-24 NOTE — Telephone Encounter (Signed)
Dr.Hopper do you have this list for meds to refill

## 2011-04-24 NOTE — Telephone Encounter (Signed)
Gloria Joseph, this should be on medicine reconciliation list ; she had yesterday.Please check or look at the discharge summary from the hospital if not clear. thank you

## 2011-04-24 NOTE — Telephone Encounter (Signed)
Patient wants to be called before rx sent in

## 2011-04-25 ENCOUNTER — Ambulatory Visit: Payer: Medicare Other | Admitting: Internal Medicine

## 2011-04-25 NOTE — Telephone Encounter (Signed)
Left message on voicemail for patient to call and inform me what meds she needs filled (patient to look at bottles and call with specifics)

## 2011-04-25 NOTE — Telephone Encounter (Signed)
The medication for facial fibrillation should be discussed with the cardiologist. Other medications can be filled X 90 days

## 2011-04-25 NOTE — Telephone Encounter (Signed)
Patient called back and indicated that she is mainly concerned about: Xarelto 20mg  for afib 150 dollars for 15 pills (patient started on Sat) Other meds include:  Hydralazine 50 mg  1 1/2 by mouth every 6 hours Isosorbide mononitrate 30 mg once daily  Metoprolol Tart 25 mg bid Minoxidil 2.5mg  2 by mouth once daily  Spironolactone 25mg  once daily  Patient would like to know if she is to continue these meds and if yes she will need 90 day supplies (patient forgot to mention this at her last OV)

## 2011-04-26 ENCOUNTER — Telehealth: Payer: Self-pay | Admitting: Internal Medicine

## 2011-04-26 NOTE — Telephone Encounter (Signed)
Left message on voicemail informing patient all meds will be filled except Xarelto, patient will need to contact the Heart specialist about that medication

## 2011-04-30 NOTE — Telephone Encounter (Signed)
DUPLICATED PHONE NOTE, ONE ALREADY OPEN ON PATIENT

## 2011-04-30 NOTE — Telephone Encounter (Signed)
Spoke with patient, patient aware to contact Heart Dr and other meds to be filled for 90 days then patient to be re-evaluated

## 2011-05-01 ENCOUNTER — Telehealth: Payer: Self-pay | Admitting: Internal Medicine

## 2011-05-01 DIAGNOSIS — I4891 Unspecified atrial fibrillation: Secondary | ICD-10-CM

## 2011-05-01 MED ORDER — MINOXIDIL 2.5 MG PO TABS: 5.0000 mg | ORAL_TABLET | Freq: Every day | ORAL | Status: DC

## 2011-05-01 MED ORDER — SPIRONOLACTONE 25 MG PO TABS: 25.0000 mg | ORAL_TABLET | Freq: Every day | ORAL | Status: DC

## 2011-05-01 MED ORDER — METOPROLOL TARTRATE 25 MG PO TABS: 25.0000 mg | ORAL_TABLET | Freq: Two times a day (BID) | ORAL | Status: DC

## 2011-05-01 MED ORDER — ISOSORBIDE MONONITRATE ER 30 MG PO TB24: 30.0000 mg | ORAL_TABLET | Freq: Every day | ORAL | Status: DC

## 2011-05-01 NOTE — Telephone Encounter (Signed)
  Last two or three days.  Discussed with Dr Johney Frame and will have her come in for a CBC,BMP and a UA  Patient aware and will call me in the morning and let me know when she is coming for labs  It will be either tomorrow or Friday

## 2011-05-01 NOTE — Telephone Encounter (Signed)
Pt is on xarelto 20mg  qd and one of the side effects is blood in the urine and pt noticed her urine is pink and she was wondering if she needed to do anything

## 2011-05-02 ENCOUNTER — Ambulatory Visit (INDEPENDENT_AMBULATORY_CARE_PROVIDER_SITE_OTHER): Payer: Medicare Other | Admitting: *Deleted

## 2011-05-02 DIAGNOSIS — I10 Essential (primary) hypertension: Secondary | ICD-10-CM

## 2011-05-02 DIAGNOSIS — R55 Syncope and collapse: Secondary | ICD-10-CM

## 2011-05-02 DIAGNOSIS — E782 Mixed hyperlipidemia: Secondary | ICD-10-CM

## 2011-05-02 LAB — URINALYSIS, ROUTINE W REFLEX MICROSCOPIC
Bilirubin Urine: NEGATIVE
Ketones, ur: NEGATIVE
Total Protein, Urine: NEGATIVE
Urine Glucose: NEGATIVE

## 2011-05-02 LAB — CBC WITH DIFFERENTIAL/PLATELET
Basophils Absolute: 0 10*3/uL (ref 0.0–0.1)
Eosinophils Relative: 3.2 % (ref 0.0–5.0)
HCT: 30 % — ABNORMAL LOW (ref 36.0–46.0)
Hemoglobin: 10.2 g/dL — ABNORMAL LOW (ref 12.0–15.0)
Lymphocytes Relative: 18.6 % (ref 12.0–46.0)
Lymphs Abs: 0.8 10*3/uL (ref 0.7–4.0)
Monocytes Relative: 10.4 % (ref 3.0–12.0)
Neutro Abs: 2.9 10*3/uL (ref 1.4–7.7)
RDW: 13.6 % (ref 11.5–14.6)
WBC: 4.3 10*3/uL — ABNORMAL LOW (ref 4.5–10.5)

## 2011-05-02 LAB — BASIC METABOLIC PANEL
BUN: 12 mg/dL (ref 6–23)
CO2: 25 mEq/L (ref 19–32)
Chloride: 106 mEq/L (ref 96–112)
Creatinine, Ser: 1 mg/dL (ref 0.4–1.2)

## 2011-05-02 NOTE — Telephone Encounter (Signed)
Order in and appointment made

## 2011-05-02 NOTE — Telephone Encounter (Signed)
FU Call: Pt calling stating that she will be here at 10 am for lab work today 05/02/11.

## 2011-05-06 ENCOUNTER — Telehealth: Payer: Self-pay | Admitting: Internal Medicine

## 2011-05-06 NOTE — Telephone Encounter (Signed)
Dr.Hopper please advise 

## 2011-05-06 NOTE — Telephone Encounter (Signed)
Patient was to return to this office for lab - recheck anemia & thyroid - she had lab at [facility] wants dr hopper to check that lab & let her know if she should wait to schedule or does it kneed to be this week

## 2011-05-06 NOTE — Telephone Encounter (Signed)
Patient aware to hold Xarelto for 3 days and then restart and call the office back and let us know how the blood in urine is doing

## 2011-05-08 ENCOUNTER — Telehealth: Payer: Self-pay | Admitting: Internal Medicine

## 2011-05-08 NOTE — Telephone Encounter (Signed)
Patient declined physical therepy at this time

## 2011-05-08 NOTE — Telephone Encounter (Signed)
Dr.Hopper informed  

## 2011-05-09 ENCOUNTER — Telehealth: Payer: Self-pay | Admitting: Internal Medicine

## 2011-05-09 NOTE — Telephone Encounter (Signed)
Pt called regarding xarelto 20mg  qd and pt has questions regarding this medication

## 2011-05-10 NOTE — Telephone Encounter (Signed)
Dr Johney Frame wants her to keep taking for the time and keep her follow up appointment on 05/29/11  Also have Dr Johney Frame look at her monitor on 05/16/11 and see if she can drive

## 2011-05-10 NOTE — Telephone Encounter (Signed)
F/U message:  Pt called again regarding the Xarelto.  Please call [phone]

## 2011-05-15 ENCOUNTER — Telehealth: Payer: Self-pay | Admitting: Internal Medicine

## 2011-05-15 DIAGNOSIS — D649 Anemia, unspecified: Secondary | ICD-10-CM

## 2011-05-15 DIAGNOSIS — D519 Vitamin B12 deficiency anemia, unspecified: Secondary | ICD-10-CM

## 2011-05-15 NOTE — Telephone Encounter (Signed)
Patient wanted to leave messsage - 1) patient is going to [facility] [phone] for lab per letter she received - 2) needs rx for breast prosthesis from mastectomy needs rx by end of year  3) needs rx for hydralacine 50 mg 1 1/2 tab every 6 hours --- synthroid 112 mcg -- simvastatin 20 mg - gabapentin 300mg  - 90 day supply e script  ---cell [phone]

## 2011-05-16 NOTE — Telephone Encounter (Signed)
Patient aware of results of monitor and that it is okay to drive with Dr Johney Frame

## 2011-05-17 MED ORDER — SIMVASTATIN 20 MG PO TABS: 20.0000 mg | ORAL_TABLET | Freq: Every day | ORAL | Status: DC

## 2011-05-17 MED ORDER — GABAPENTIN 300 MG PO CAPS: 300.0000 mg | ORAL_CAPSULE | Freq: Every day | ORAL | Status: DC

## 2011-05-17 MED ORDER — LEVOTHYROXINE SODIUM 112 MCG PO TABS: 112.0000 ug | ORAL_TABLET | Freq: Every day | ORAL | Status: DC

## 2011-05-17 MED ORDER — AMBULATORY NON FORMULARY MEDICATION: Status: DC

## 2011-05-17 MED ORDER — HYDRALAZINE HCL 50 MG PO TABS: ORAL_TABLET | ORAL | Status: DC

## 2011-05-17 NOTE — Telephone Encounter (Signed)
RX's sent patient aware, future orders placed and rx for prosthesis placed at the front

## 2011-05-20 ENCOUNTER — Other Ambulatory Visit (INDEPENDENT_AMBULATORY_CARE_PROVIDER_SITE_OTHER): Payer: Medicare Other

## 2011-05-20 DIAGNOSIS — D518 Other vitamin B12 deficiency anemias: Secondary | ICD-10-CM

## 2011-05-20 DIAGNOSIS — D649 Anemia, unspecified: Secondary | ICD-10-CM

## 2011-05-20 DIAGNOSIS — D519 Vitamin B12 deficiency anemia, unspecified: Secondary | ICD-10-CM

## 2011-05-20 LAB — CBC WITH DIFFERENTIAL/PLATELET
Basophils Relative: 1 % (ref 0.0–3.0)
Eosinophils Relative: 3.3 % (ref 0.0–5.0)
HCT: 32.5 % — ABNORMAL LOW (ref 36.0–46.0)
Hemoglobin: 10.9 g/dL — ABNORMAL LOW (ref 12.0–15.0)
Lymphs Abs: 1.3 10*3/uL (ref 0.7–4.0)
MCV: 90.4 fl (ref 78.0–100.0)
Monocytes Absolute: 0.4 10*3/uL (ref 0.1–1.0)
Neutro Abs: 2.2 10*3/uL (ref 1.4–7.7)
Neutrophils Relative %: 54.3 % (ref 43.0–77.0)
RBC: 3.6 Mil/uL — ABNORMAL LOW (ref 3.87–5.11)
WBC: 4.1 10*3/uL — ABNORMAL LOW (ref 4.5–10.5)

## 2011-05-20 LAB — IBC PANEL: Transferrin: 253.2 mg/dL (ref 212.0–360.0)

## 2011-05-22 LAB — FOLATE: Folate: 18.4 ng/mL (ref >5.9)

## 2011-05-23 ENCOUNTER — Other Ambulatory Visit: Payer: Self-pay | Admitting: *Deleted

## 2011-05-23 MED ORDER — RIVAROXABAN 20 MG PO TABS: 20.0000 mg | ORAL_TABLET | Freq: Every day | ORAL | Status: DC

## 2011-05-27 ENCOUNTER — Ambulatory Visit (INDEPENDENT_AMBULATORY_CARE_PROVIDER_SITE_OTHER): Payer: Medicare Other | Admitting: Internal Medicine

## 2011-05-27 ENCOUNTER — Encounter: Payer: Self-pay | Admitting: Internal Medicine

## 2011-05-27 ENCOUNTER — Telehealth: Payer: Self-pay | Admitting: Internal Medicine

## 2011-05-27 DIAGNOSIS — I10 Essential (primary) hypertension: Secondary | ICD-10-CM | POA: Diagnosis not present

## 2011-05-27 DIAGNOSIS — E538 Deficiency of other specified B group vitamins: Secondary | ICD-10-CM

## 2011-05-27 MED ORDER — ZOSTER VACCINE LIVE 19400 UNT/0.65ML ~~LOC~~ SOLR: 0.6500 mL | Freq: Once | SUBCUTANEOUS | Status: DC

## 2011-05-27 MED ORDER — CYANOCOBALAMIN 1000 MCG/ML IJ SOLN
1000.0000 ug | Freq: Once | INTRAMUSCULAR | Status: AC
  Administered 2011-05-27: 1000 ug via INTRAMUSCULAR

## 2011-05-27 NOTE — Progress Notes (Signed)
  Subjective:    Patient ID: Gloria Joseph, female    DOB: Feb 19, 1941, 71 y.o.   MRN: [id number]  HPI CHRONIC HYPERTENSION: Disease Monitoring  Blood pressure averages 146/73  Chest pain: no   Dyspnea: no   Claudication: no   Medication compliance: She was prescribed hydralazine 50 mg one half every 6 hours. She's only taking it 3 times a day. I recommended that she take 2 pills every 8 hours which would be the recommended 300 mg  Total dose per day  Medication Side Effects  Lightheadedness: no   Urinary frequency: yes due to PO intake   Edema: yes, LLE , resolves overnight      Preventitive Healthcare:  Exercise: no, given OK by Cardiologist for treadmill   Diet Pattern: W Ws'  Salt Restriction: yes     Review of Systems she denies abdominal pain, melena, rectal bleeding. Her hematocrit continues to rise; it was 32.5 on 05/20/11. Lowest  hematocrit was 28.3 on 12/5. Iron and folate levels are normal. Serial B12 levels show progressive decrease. It is now low-normal at 242.  She's previously been on B12 shots but stopped it when her levels stabilized in the normal range. She subsequently took an oral supplement but stopped this as well     Objective:   Physical Exam she appears healthy and well-nourished  All pulses are intact without bruits or deficits  Heart sounds are somewhat distant but rhythm is regular.  Chest is clear to auscultation with no increased work of breathing or abnormal breath sounds  She has no cyanosis, clubbing, or edema.          Assessment & Plan:

## 2011-05-27 NOTE — Progress Notes (Signed)
Addended by: Edison Simon C on: 05/27/2011 11:00 AM   Modules accepted: Orders

## 2011-05-27 NOTE — Telephone Encounter (Signed)
Please call [name] from [facility] pharmacy. She has questions regarding hydralazine.   [phone] option 2  [id number]

## 2011-05-27 NOTE — Assessment & Plan Note (Signed)
Hydralazine 50 mg one half every 6 hours was changed to 2 every 8 hours as she is not been taking her fourth dose at night. Blood pressure goal remains an average less than 140/90, ideally less than 135/85.

## 2011-05-27 NOTE — Assessment & Plan Note (Signed)
I would recommend that she take vitamin B12 @ dose of  1,000 international units every other month and then recheck a B12 level in 6 months.

## 2011-05-27 NOTE — Progress Notes (Signed)
Addended by: Maurice Small on: 05/27/2011 02:51 PM   Modules accepted: Orders

## 2011-05-27 NOTE — Patient Instructions (Signed)
B12 one  thousand international units every other month ; recheck B12 level in 6 months. PLEASE BRING THESE INSTRUCTIONS TO FOLLOW UP  LAB APPOINTMENT.This will guarantee correct labs are drawn, eliminating need for repeat blood sampling ( needle sticks ! ). Diagnoses /Codes: 281.0

## 2011-05-28 NOTE — Telephone Encounter (Signed)
Pharmacy was calling to verify instruction for Hydralazine and verify Hydralazine and minoxidil to be taking together. I verified with Dr.Hopper and he stated yes as meds were rx'ed in the hospital.

## 2011-05-29 ENCOUNTER — Encounter: Payer: Self-pay | Admitting: Internal Medicine

## 2011-05-29 ENCOUNTER — Ambulatory Visit (INDEPENDENT_AMBULATORY_CARE_PROVIDER_SITE_OTHER): Payer: Medicare Other | Admitting: Internal Medicine

## 2011-05-29 VITALS — BP 142/82 | HR 46 | Ht 67.0 in | Wt 180.8 lb

## 2011-05-29 DIAGNOSIS — I4891 Unspecified atrial fibrillation: Secondary | ICD-10-CM | POA: Diagnosis not present

## 2011-05-29 DIAGNOSIS — I119 Hypertensive heart disease without heart failure: Secondary | ICD-10-CM | POA: Diagnosis not present

## 2011-05-29 DIAGNOSIS — I10 Essential (primary) hypertension: Secondary | ICD-10-CM

## 2011-05-29 DIAGNOSIS — E119 Type 2 diabetes mellitus without complications: Secondary | ICD-10-CM | POA: Diagnosis not present

## 2011-05-29 NOTE — Assessment & Plan Note (Signed)
Asymptomatic Much improved No indication for pacing

## 2011-05-29 NOTE — Assessment & Plan Note (Signed)
Continue xarelto for stroke prevention

## 2011-05-29 NOTE — Assessment & Plan Note (Signed)
Stable Check renal artery doppler to exclude RAS as a cause

## 2011-05-29 NOTE — Patient Instructions (Addendum)
Your physician wants you to follow-up in: 6 months.  You will receive a reminder letter in the mail two months in advance. If you don't receive a letter, please call our office to schedule the follow-up appointment.  Your physician has requested that you have a renal artery duplex. During this test, an ultrasound is used to evaluate blood flow to the kidneys. Allow one hour for this exam. Do not eat after midnight the day before and avoid carbonated beverages. Take your medications as you usually do.

## 2011-05-29 NOTE — Progress Notes (Signed)
PCP:  Marga Melnick, MD, MD  The patient presents today for routine electrophysiology followup.  Since her recent hospital discharge, the patient reports doing very well.  She denies dizziness, presyncope or syncope.  She finds that her BP at home is mostly 140s/80s.  She has brief hematuria on xarelto, though this has resolved.  Today, she denies symptoms of palpitations, chest pain, shortness of breath, orthopnea, PND, lower extremity edema, or neurologic sequela.  The patient feels that she is tolerating medications without difficulties and is otherwise without complaint today.   Past Medical History  Diagnosis Date  . Diabetes mellitus, type 2     diet controlled  . Hypertension     medicine refractory  . Osteopenia   . Hyperlipidemia   . Cancer     breast CA s/p Right mastectomy  . Hypothyroidism     s/p thryroid gland ablation  . Neuromuscular disorder   . Atrial fibrillation 04/13/2011    on Xarelto for stroke prevention   Past Surgical History  Procedure Date  . Breast enhancement surgery   . Breast implant removal 2000    Unilaterally   . Cholecystectomy   . Colonoscopy 2002    Neg  . Rai 04/04/2006  . Mastectomy   . Tubal ligation   . Breast surgery     Current Outpatient Prescriptions  Medication Sig Dispense Refill  . AMBULATORY NON FORMULARY MEDICATION Breast Prothesis,  DX: Mastectomy  2 each  0  . gabapentin (NEURONTIN) 300 MG capsule Take 1 capsule (300 mg total) by mouth at bedtime.  90 capsule  1  . hydrALAZINE (APRESOLINE) 50 MG tablet 2 by mouth every 8 hours  540 tablet  0  . isosorbide mononitrate (IMDUR) 30 MG 24 hr tablet Take 1 tablet (30 mg total) by mouth daily.  90 tablet  0  . levothyroxine (SYNTHROID, LEVOTHROID) 112 MCG tablet Take 1 tablet (112 mcg total) by mouth daily. 1 daily except 1/2 on Tues/Thurs/Sat  90 tablet  0  . metoprolol tartrate (LOPRESSOR) 25 MG tablet Take 1 tablet (25 mg total) by mouth 2 (two) times daily.  180 tablet  0  .  minoxidil (LONITEN) 2.5 MG tablet Take 2 tablets (5 mg total) by mouth daily.  90 tablet  0  . Rivaroxaban 20 MG TABS Take 20 mg by mouth daily with supper.  90 tablet  3  . simvastatin (ZOCOR) 20 MG tablet Take 1 tablet (20 mg total) by mouth at bedtime.  90 tablet  1  . spironolactone (ALDACTONE) 25 MG tablet Take 1 tablet (25 mg total) by mouth daily.  90 tablet  0    Allergies  Allergen Reactions  . Lisinopril     ? Angioedema (NOT ARB candidate)  . Hctz (Hydrochlorothiazide)     hyponatremia  . Spironolactone     REACTION: low potassium ???  . Penicillins Other (See Comments)    Pt states medication doesn't work for her d/t her usage of it several years ago.  . Amlodipine Besylate     REACTION: edema    History   Social History  . Marital Status: Divorced    Spouse Name: N/A    Number of Children: N/A  . Years of Education: N/A   Occupational History  . Not on file.   Social History Main Topics  . Smoking status: Never Smoker   . Smokeless tobacco: Not on file  . Alcohol Use: No  . Drug Use: No  .  Sexually Active: No   Other Topics Concern  . Not on file   Social History Narrative  . No narrative on file    Family History  Problem Relation Age of Onset  . Depression Mother   . Alcohol abuse Father   . Heart attack Brother 65  . Cancer Other     Uncle, not specific  . Cancer Other     Aunt, not specific. GYN CA  . Colon cancer Other     Grandfather-not specific  . Breast cancer Other     Aunt, not specific  . Diabetes Other     Grandmother, not specific    ROS-  All systems are reviewed and are negative except as outlined in the HPI above   Physical Exam: Filed Vitals:   05/29/11 1356  BP: 142/82  Pulse: 46  Height: 5\' 7"  (1.702 m)  Weight: 180 lb 12.8 oz (82.01 kg)    GEN- The patient is well appearing, alert and oriented x 3 today.   Head- normocephalic, atraumatic Eyes-  Sclera clear, conjunctiva pink Ears- hearing  intact Oropharynx- clear Neck- supple, no JVP Lymph- no cervical lymphadenopathy Lungs- Clear to ausculation bilaterally, normal work of breathing Heart- Regular rate and rhythm, no murmurs, rubs or gallops, PMI not laterally displaced GI- soft, NT, ND, + BS Extremities- no clubbing, cyanosis, or edema MS- no significant deformity or atrophy Skin- no rash or lesion Psych- euthymic mood, full affect Neuro- strength and sensation are intact  Event monitor from 11/12 is reviewed and reveals episodic afib, no significant brady or pauses  Assessment and Plan:

## 2011-06-04 ENCOUNTER — Telehealth: Payer: Self-pay | Admitting: Internal Medicine

## 2011-06-04 MED ORDER — RIVAROXABAN 20 MG PO TABS: 20.0000 mg | ORAL_TABLET | Freq: Every day | ORAL | Status: DC

## 2011-06-04 NOTE — Telephone Encounter (Signed)
Sent into [facility]  Pt aware

## 2011-06-04 NOTE — Telephone Encounter (Signed)
Pt was in last week, was to get refill of xarelto , it was not called in at appt, she was told it was, e script doesn't not have any records of it being ordered, wants to just pick a written rx tomorrow requested 30 days, pls call when ready [phone]

## 2011-06-06 ENCOUNTER — Encounter: Payer: Self-pay | Admitting: Internal Medicine

## 2011-06-13 ENCOUNTER — Encounter: Payer: Medicare Other | Admitting: Cardiology

## 2011-06-20 ENCOUNTER — Encounter (INDEPENDENT_AMBULATORY_CARE_PROVIDER_SITE_OTHER): Payer: Medicare Other | Admitting: Cardiology

## 2011-06-20 DIAGNOSIS — I10 Essential (primary) hypertension: Secondary | ICD-10-CM | POA: Diagnosis not present

## 2011-06-20 DIAGNOSIS — I119 Hypertensive heart disease without heart failure: Secondary | ICD-10-CM

## 2011-06-20 DIAGNOSIS — I4891 Unspecified atrial fibrillation: Secondary | ICD-10-CM

## 2011-06-28 ENCOUNTER — Ambulatory Visit (INDEPENDENT_AMBULATORY_CARE_PROVIDER_SITE_OTHER): Payer: Medicare Other

## 2011-06-28 DIAGNOSIS — D518 Other vitamin B12 deficiency anemias: Secondary | ICD-10-CM | POA: Diagnosis not present

## 2011-06-28 DIAGNOSIS — D519 Vitamin B12 deficiency anemia, unspecified: Secondary | ICD-10-CM

## 2011-06-28 MED ORDER — CYANOCOBALAMIN 1000 MCG/ML IJ SOLN
1000.0000 ug | Freq: Once | INTRAMUSCULAR | Status: AC
  Administered 2011-06-28: 1000 ug via INTRAMUSCULAR

## 2011-07-26 ENCOUNTER — Ambulatory Visit (INDEPENDENT_AMBULATORY_CARE_PROVIDER_SITE_OTHER): Payer: Medicare Other | Admitting: *Deleted

## 2011-07-26 DIAGNOSIS — E538 Deficiency of other specified B group vitamins: Secondary | ICD-10-CM | POA: Diagnosis not present

## 2011-07-26 MED ORDER — CYANOCOBALAMIN 1000 MCG/ML IJ SOLN
1000.0000 ug | Freq: Once | INTRAMUSCULAR | Status: AC
  Administered 2011-07-26: 1000 ug via INTRAMUSCULAR

## 2011-08-01 ENCOUNTER — Telehealth: Payer: Self-pay | Admitting: Internal Medicine

## 2011-08-01 NOTE — Telephone Encounter (Signed)
Refill:  Imdur 30 #90 TQD Lopressor 25mg  #180 TBID Minoxidil 2.5mg  #180 TTQD Spironolactone 25mg  #90 TQD  Put on Hold:  Gabapentin 300mg  #90 THS Hydralizine 50mg  #540 TTQ8H Levothyroxine #70 TM/W/F/Sun; 1/2T/TH/Sat Zocor 20mg  #90 THS  Pharmacy notes: Had last [facility]. Please send new orders

## 2011-08-02 ENCOUNTER — Telehealth: Payer: Self-pay | Admitting: Internal Medicine

## 2011-08-02 MED ORDER — ISOSORBIDE MONONITRATE ER 30 MG PO TB24: 30.0000 mg | ORAL_TABLET | Freq: Every day | ORAL | Status: DC

## 2011-08-02 MED ORDER — GABAPENTIN 300 MG PO CAPS: 300.0000 mg | ORAL_CAPSULE | Freq: Every day | ORAL | Status: DC

## 2011-08-02 MED ORDER — SPIRONOLACTONE 25 MG PO TABS: 25.0000 mg | ORAL_TABLET | Freq: Every day | ORAL | Status: DC

## 2011-08-02 MED ORDER — METOPROLOL TARTRATE 25 MG PO TABS: 25.0000 mg | ORAL_TABLET | Freq: Two times a day (BID) | ORAL | Status: DC

## 2011-08-02 NOTE — Telephone Encounter (Signed)
Discuss with patient  

## 2011-08-02 NOTE — Telephone Encounter (Signed)
Pt requesting refill of Minoxidil, uses [facility], appt due July, requesting refills to last until then

## 2011-08-02 NOTE — Telephone Encounter (Signed)
Left message to call office

## 2011-08-02 NOTE — Telephone Encounter (Signed)
Hydralazine 3 times a day dispense 270; please verify dose

## 2011-08-02 NOTE — Telephone Encounter (Signed)
1.) Imdur, Lopressor, Spironolactone was filled 3 month supply with 1 additional refill. Gabapentin was filled (with indication to hold until patient requests)   2.) Per Dr.Hopper any additional refills for Minoxidil needs to come from cardiologist   3.) Patient needs appointment for labs Lipid/Hep/TSH 244.9/995.20/272.4 in order to fill Levothyroxine/Zocor  4.) **Dr.Hopper please advise on refill request for Hydralizine 2 by mouth every 8 hours with dispense number of 540**

## 2011-08-05 MED ORDER — HYDRALAZINE HCL 50 MG PO TABS: ORAL_TABLET | ORAL | Status: DC

## 2011-08-05 NOTE — Telephone Encounter (Signed)
Jem, I did not order Minoxidil  & have not written this in  30 years. If you feel she needs to be on this &  hydralazine; please refill them. Thanks, [employer]

## 2011-08-05 NOTE — Telephone Encounter (Signed)
Discuss with patient, Rx sent. 

## 2011-08-05 NOTE — Telephone Encounter (Signed)
I will have pt follow-up with my PA Tereso Newcomer) for blood pressure check and to refill the medicine. (I typically don't use minoxidil either).Marland KitchenMarland Kitchen

## 2011-08-06 NOTE — Telephone Encounter (Signed)
Sorry about "Jem"; 2 of 4 docs out yesterday & I had 14 phone messages @ day's end. These meds must have been started by Hospitalists. Hopefully this kind of issue will be addressed by [facility]( pt "home" ) but I am dubious. Thanks, [employer]

## 2011-08-08 ENCOUNTER — Other Ambulatory Visit (INDEPENDENT_AMBULATORY_CARE_PROVIDER_SITE_OTHER): Payer: Medicare Other

## 2011-08-08 DIAGNOSIS — E039 Hypothyroidism, unspecified: Secondary | ICD-10-CM

## 2011-08-08 DIAGNOSIS — T887XXA Unspecified adverse effect of drug or medicament, initial encounter: Secondary | ICD-10-CM | POA: Diagnosis not present

## 2011-08-08 DIAGNOSIS — E785 Hyperlipidemia, unspecified: Secondary | ICD-10-CM | POA: Diagnosis not present

## 2011-08-08 LAB — LIPID PANEL
HDL: 61.7 mg/dL (ref >39.00)
Total CHOL/HDL Ratio: 2
VLDL: 19.8 mg/dL (ref 0.0–40.0)

## 2011-08-08 LAB — TSH: TSH: 0.9 u[IU]/mL (ref 0.35–5.50)

## 2011-08-08 LAB — HEPATIC FUNCTION PANEL
AST: 19 U/L (ref 0–37)
Bilirubin, Direct: 0 mg/dL (ref 0.0–0.3)
Total Bilirubin: 0.6 mg/dL (ref 0.3–1.2)

## 2011-08-09 ENCOUNTER — Ambulatory Visit (INDEPENDENT_AMBULATORY_CARE_PROVIDER_SITE_OTHER): Payer: Medicare Other | Admitting: Physician Assistant

## 2011-08-09 ENCOUNTER — Encounter: Payer: Self-pay | Admitting: Physician Assistant

## 2011-08-09 VITALS — BP 188/88 | HR 45 | Ht 67.0 in | Wt 185.0 lb

## 2011-08-09 DIAGNOSIS — I10 Essential (primary) hypertension: Secondary | ICD-10-CM | POA: Diagnosis not present

## 2011-08-09 DIAGNOSIS — I4891 Unspecified atrial fibrillation: Secondary | ICD-10-CM

## 2011-08-09 NOTE — Patient Instructions (Signed)
Your physician recommends that you schedule a follow-up WITH DR. ALLRED IN July  NO CHANGES TODAY

## 2011-08-09 NOTE — Progress Notes (Signed)
[address] [place], [place]  [address] Phone: [phone] Fax:  [phone]  Date:  08/09/2011   Name:  Gloria Joseph       DOB:  12-27-1940 MRN:  [id number]  PCP:  Dr. Alwyn Ren Primary Cardiologist:  Dr. Hillis Range  Primary Electrophysiologist:  Dr. Hillis Range    History of Present Illness: Gloria Joseph is a 71 y.o. female who presents for follow up on BP.  She was initially seen by Dr. Johney Frame in consultation in the hospital 03/2011.  She has a history of hypertension, hyperlipidemia and diabetes as well as hypothyroidism and breast cancer status post mastectomy.  She was seen for near syncope.  Findings were suggestive of hypertensive urgency.  She was also noted to have atrial fibrillation.  She was started on anticoagulation therapy.  There was some concern for tachycardia-bradycardia syndrome and whether or not she would need pacemaker.  She did follow up in the office with a 21 day event monitor.  She had episodic atrial fibrillation.  When she last saw Dr. Johney Frame 05/29/11, he did not feel there were any indications for pacing at that time.  He did set her up for renal arterial Dopplers which was performed 06/20/11 and demonstrated no renal artery stenosis.  The hospitalist service placed her on minoxidil in the hospital in 03/2011 for her blood pressure.  She called her PCP recently for a refill.  He was not comfortable with refilling this medication and contacted Dr. Johney Frame who suggested that she see me today.  She apparently has white coat hypertension.  She has a nephew who is a CNA who checks her blood pressure with a manual cuff on a daily basis.  Her pressures at home have been ranging 120s to 130s over 60s to 70s.  She ran out of minoxidil a week ago.  Her blood pressure has not changed.  She feels wonderful.  The patient denies chest pain, shortness of breath, syncope, orthopnea, PND or significant pedal edema.   Past Medical History    Diagnosis Date  . Diabetes mellitus, type 2     diet controlled  . Hypertension     medicine refractory  . Osteopenia   . Hyperlipidemia   . Cancer     breast CA s/p Right mastectomy  . Hypothyroidism     s/p thryroid gland ablation  . Neuromuscular disorder   . Atrial fibrillation 04/13/2011    on Xarelto for stroke prevention    Current Outpatient Prescriptions  Medication Sig Dispense Refill  . AMBULATORY NON FORMULARY MEDICATION Breast Prothesis,  DX: Mastectomy  2 each  0  . gabapentin (NEURONTIN) 300 MG capsule Take 1 capsule (300 mg total) by mouth at bedtime.  90 capsule  1  . hydrALAZINE (APRESOLINE) 50 MG tablet 2 by mouth every 8 hours  270 tablet  0  . isosorbide mononitrate (IMDUR) 30 MG 24 hr tablet Take 1 tablet (30 mg total) by mouth daily.  90 tablet  1  . levothyroxine (SYNTHROID, LEVOTHROID) 112 MCG tablet Take 1 tablet (112 mcg total) by mouth daily. 1 daily except 1/2 on Tues/Thurs/Sat  90 tablet  0  . metoprolol tartrate (LOPRESSOR) 25 MG tablet Take 1 tablet (25 mg total) by mouth 2 (two) times daily.  180 tablet  1  . Rivaroxaban 20 MG TABS Take 20 mg by mouth daily with supper.  30 tablet  11  . simvastatin (ZOCOR) 20 MG tablet Take 1 tablet (  20 mg total) by mouth at bedtime.  90 tablet  1  . spironolactone (ALDACTONE) 25 MG tablet Take 1 tablet (25 mg total) by mouth daily.  90 tablet  1    Allergies: Allergies  Allergen Reactions  . Lisinopril     ? Angioedema (NOT ARB candidate)  . Hctz (Hydrochlorothiazide)     hyponatremia  . Spironolactone     REACTION: low potassium ???  . Penicillins Other (See Comments)    Pt states medication doesn't work for her d/t her usage of it several years ago.  . Amlodipine Besylate     REACTION: edema    History  Substance Use Topics  . Smoking status: Never Smoker   . Smokeless tobacco: Never Used  . Alcohol Use: No     PHYSICAL EXAM: VS:  BP 188/88  Pulse 45  Ht 5\' 7"  (1.702 m)  Wt 185 lb (83.915  kg)  BMI 28.97 kg/m2 Well nourished, well developed, in no acute distress HEENT: normal Neck: no JVD Cardiac:  normal S1, S2; RRR; no murmur Lungs:  clear to auscultation bilaterally, no wheezing, rhonchi or rales Abd: soft, nontender, no hepatomegaly Ext: no edema Skin: warm and dry Neuro:  CNs 2-12 intact, no focal abnormalities noted  EKG:  Sinus bradycardia, rate 45, leftward axis, nonspecific ST-T wave changes  ASSESSMENT AND PLAN:  1. Atrial fibrillation  Maintaining NSR.  Continue Xarelto.  Follow up with Dr. Hillis Range as planned.    2. HYPERTENSION  She keeps a close monitor of her blood pressures at home.  Her nephew is a CNA and he checks her blood pressures with a manual cuff.  I repeated her blood pressure and obtain the same result.  I used her manual cuff and it was equal.  She has not had any change in her blood pressure Since she stopped minoxidil.  At this point in time, I do not think it is necessary to restart it.  She will continue to monitor her blood pressures.  If her blood pressure started to go above goal, she will contact us.  At that point we can start her back on minoxidil 2.5-5 mg daily.    Signed, Tereso Newcomer, PA-C  10:20 AM 08/09/2011

## 2011-09-11 ENCOUNTER — Other Ambulatory Visit: Payer: Self-pay

## 2011-09-11 MED ORDER — LEVOTHYROXINE SODIUM 112 MCG PO TABS: 112.0000 ug | ORAL_TABLET | Freq: Every day | ORAL | Status: DC

## 2011-09-11 MED ORDER — SIMVASTATIN 20 MG PO TABS: 20.0000 mg | ORAL_TABLET | Freq: Every day | ORAL | Status: DC

## 2011-10-09 ENCOUNTER — Telehealth: Payer: Self-pay | Admitting: Physician Assistant

## 2011-10-09 ENCOUNTER — Telehealth: Payer: Self-pay | Admitting: Internal Medicine

## 2011-10-09 NOTE — Telephone Encounter (Signed)
New msg Pt wants to talk to you about her BP 186/76 and 164/70. Please call

## 2011-10-10 NOTE — Telephone Encounter (Signed)
Called patient back and lmom for her to return my call in regards to her BP

## 2011-10-10 NOTE — Telephone Encounter (Signed)
Close  

## 2011-10-11 NOTE — Telephone Encounter (Signed)
Patient returning nurse call, she can be reached [phone].

## 2011-10-15 NOTE — Telephone Encounter (Signed)
lmom for patient to call me back. 

## 2011-10-15 NOTE — Telephone Encounter (Signed)
Fu call °Pt returning your call  °

## 2011-10-15 NOTE — Telephone Encounter (Signed)
Spoke with the patient  Back in March she had a rx for minoxidil  Dr Alwyn Ren and Dr Johney Frame went back and forth about her BP  Now BP is going back up 185/76 then went down to 160/78  She just wants to know what to do.  She has an appointment with Dr Johney Frame tomorrow at 10:45 to discuss

## 2011-10-16 ENCOUNTER — Ambulatory Visit (INDEPENDENT_AMBULATORY_CARE_PROVIDER_SITE_OTHER): Payer: Medicare Other | Admitting: Internal Medicine

## 2011-10-16 VITALS — BP 140/62

## 2011-10-16 DIAGNOSIS — I10 Essential (primary) hypertension: Secondary | ICD-10-CM

## 2011-10-16 DIAGNOSIS — I498 Other specified cardiac arrhythmias: Secondary | ICD-10-CM

## 2011-10-16 DIAGNOSIS — R001 Bradycardia, unspecified: Secondary | ICD-10-CM

## 2011-10-16 DIAGNOSIS — I4891 Unspecified atrial fibrillation: Secondary | ICD-10-CM

## 2011-10-16 LAB — BASIC METABOLIC PANEL WITH GFR
BUN: 16 mg/dL (ref 6–23)
CO2: 28 meq/L (ref 19–32)
Calcium: 9.5 mg/dL (ref 8.4–10.5)
Chloride: 106 meq/L (ref 96–112)
Creatinine, Ser: 0.9 mg/dL (ref 0.4–1.2)
GFR: 62.31 mL/min (ref >60.00)
Glucose, Bld: 95 mg/dL (ref 70–99)
Potassium: 4.6 meq/L (ref 3.5–5.1)
Sodium: 139 meq/L (ref 135–145)

## 2011-10-16 MED ORDER — SPIRONOLACTONE 50 MG PO TABS: 50.0000 mg | ORAL_TABLET | Freq: Every day | ORAL | Status: DC

## 2011-10-16 NOTE — Assessment & Plan Note (Signed)
Above goal Increase spironolactone to 50mg  daily bmet today and again in 4 weeks Follow-up with Dr Alwyn Ren for further BP evalu in 6-8 weeks. If BP remains elevated, we could consider restarting monoxidil 2.5mg  daily.

## 2011-10-16 NOTE — Patient Instructions (Signed)
Your physician recommends that you schedule a follow-up appointment in: 2 months with Dr Alwyn Ren  Your physician wants you to follow-up in: 6 months with Dr Johney Frame Bonita Quin will receive a reminder letter in the mail two months in advance. If you don't receive a letter, please call our office to schedule the follow-up appointment.  Your physician recommends that you return for lab work today (BMP) and in 4 weeks (BMP)  Your physician has recommended you make the following change in your medication:  1) Increase Spironolactone to 50mg  daily

## 2011-10-16 NOTE — Assessment & Plan Note (Signed)
Stable No change required today  

## 2011-10-16 NOTE — Progress Notes (Signed)
PCP:  Marga Melnick, MD, MD  The patient presents today for routine cardiology followup.  Since last being seen in our clinic, the patient reports doing very well.  She states "I feel great".  Her concern is with ongoing elevations in her blood pressure.  Her BP at home has been 140s-180s/60s-70s.   Today, she denies symptoms of palpitations, chest pain, shortness of breath, orthopnea, PND, lower extremity edema, dizziness, presyncope, syncope, or neurologic sequela.  The patient feels that she is tolerating medications without difficulties and is otherwise without complaint today.   Past Medical History  Diagnosis Date  . Diabetes mellitus, type 2     diet controlled  . Hypertension     medicine refractory  . Osteopenia   . Hyperlipidemia   . Cancer     breast CA s/p Right mastectomy  . Hypothyroidism     s/p thryroid gland ablation  . Neuromuscular disorder   . Atrial fibrillation 04/13/2011    on Xarelto for stroke prevention   Past Surgical History  Procedure Date  . Breast enhancement surgery   . Breast implant removal 2000    Unilaterally   . Cholecystectomy   . Colonoscopy 2002    Neg  . Rai 04/04/2006  . Mastectomy   . Tubal ligation   . Breast surgery     Current Outpatient Prescriptions  Medication Sig Dispense Refill  . AMBULATORY NON FORMULARY MEDICATION Breast Prothesis,  DX: Mastectomy  2 each  0  . gabapentin (NEURONTIN) 300 MG capsule Take 1 capsule (300 mg total) by mouth at bedtime.  90 capsule  1  . hydrALAZINE (APRESOLINE) 50 MG tablet 2 by mouth every 8 hours  270 tablet  0  . isosorbide mononitrate (IMDUR) 30 MG 24 hr tablet Take 1 tablet (30 mg total) by mouth daily.  90 tablet  1  . levothyroxine (SYNTHROID, LEVOTHROID) 112 MCG tablet Take 1 tablet (112 mcg total) by mouth daily. 1 daily except 1/2 on Tues/Thurs/Sat  90 tablet  1  . metoprolol tartrate (LOPRESSOR) 25 MG tablet Take 1 tablet (25 mg total) by mouth 2 (two) times daily.  180 tablet   1  . Rivaroxaban 20 MG TABS Take 20 mg by mouth daily with supper.  30 tablet  11  . simvastatin (ZOCOR) 20 MG tablet Take 1 tablet (20 mg total) by mouth at bedtime.  90 tablet  1  . spironolactone (ALDACTONE) 50 MG tablet Take 1 tablet (50 mg total) by mouth daily.  90 tablet  3  . DISCONTD: spironolactone (ALDACTONE) 25 MG tablet Take 1 tablet (25 mg total) by mouth daily.  90 tablet  1  . DISCONTD: hydrALAZINE (APRESOLINE) 50 MG tablet Take 1.5 tablets (75 mg total) by mouth every 6 (six) hours.  100 tablet  1  . DISCONTD: minoxidil (LONITEN) 2.5 MG tablet Take 2 tablets (5 mg total) by mouth daily.  90 tablet  0    Allergies  Allergen Reactions  . Lisinopril     ? Angioedema (NOT ARB candidate)  . Hctz (Hydrochlorothiazide)     hyponatremia  . Spironolactone     REACTION: low potassium ???  . Penicillins Other (See Comments)    Pt states medication doesn't work for her d/t her usage of it several years ago.  . Amlodipine Besylate     REACTION: edema    History   Social History  . Marital Status: Divorced    Spouse Name: N/A  Number of Children: N/A  . Years of Education: N/A   Occupational History  . Not on file.   Social History Main Topics  . Smoking status: Never Smoker   . Smokeless tobacco: Never Used  . Alcohol Use: No  . Drug Use: No  . Sexually Active: No   Other Topics Concern  . Not on file   Social History Narrative  . No narrative on file    Family History  Problem Relation Age of Onset  . Depression Mother   . Alcohol abuse Father   . Heart attack Brother 65  . Cancer Other     Uncle, not specific  . Cancer Other     Aunt, not specific. GYN CA  . Colon cancer Other     Grandfather-not specific  . Breast cancer Other     Aunt, not specific  . Diabetes Other     Grandmother, not specific    ROS-  All systems are reviewed and are negative except as outlined in the HPI above   Physical Exam: Filed Vitals:   10/16/11 1104  BP:  140/62    GEN- The patient is well appearing, alert and oriented x 3 today.   Head- normocephalic, atraumatic Eyes-  Sclera clear, conjunctiva pink Ears- hearing intact Oropharynx- clear Neck- supple, no JVP Lymph- no cervical lymphadenopathy Lungs- Clear to ausculation bilaterally, normal work of breathing Heart- Regular rate and rhythm, no murmurs, rubs or gallops, PMI not laterally displaced GI- soft, NT, ND, + BS Extremities- no clubbing, cyanosis, or edema   Assessment and Plan:

## 2011-11-13 ENCOUNTER — Ambulatory Visit (INDEPENDENT_AMBULATORY_CARE_PROVIDER_SITE_OTHER): Payer: Medicare Other | Admitting: *Deleted

## 2011-11-13 DIAGNOSIS — E782 Mixed hyperlipidemia: Secondary | ICD-10-CM | POA: Diagnosis not present

## 2011-11-13 DIAGNOSIS — I10 Essential (primary) hypertension: Secondary | ICD-10-CM | POA: Diagnosis not present

## 2011-11-13 LAB — BASIC METABOLIC PANEL
BUN: 13 mg/dL (ref 6–23)
CO2: 27 mEq/L (ref 19–32)
Chloride: 100 mEq/L (ref 96–112)
Glucose, Bld: 90 mg/dL (ref 70–99)
Potassium: 4.7 mEq/L (ref 3.5–5.1)
Sodium: 135 mEq/L (ref 135–145)

## 2011-11-25 ENCOUNTER — Ambulatory Visit: Payer: Medicare Other | Admitting: Internal Medicine

## 2011-12-09 ENCOUNTER — Other Ambulatory Visit: Payer: Self-pay | Admitting: Internal Medicine

## 2011-12-09 NOTE — Telephone Encounter (Signed)
Caller: Gloria Joseph/Patient; Phone Number: [phone]; Message from caller: Pt has a physical sceduled on 12/18/11 and wants to know if she needs to come in and do labs before appt so results can be discussed at visit.  She also states office should be receiving a refill request for Hydralazine.  Her new cell number to be updated is [phone]

## 2011-12-09 NOTE — Telephone Encounter (Signed)
I spoke with patient and informed her labs to be done at appointment or after but not before due to insurance. Patient verbalized understanding, patient also aware rx sent to pharmacy

## 2011-12-18 ENCOUNTER — Encounter: Payer: Self-pay | Admitting: Internal Medicine

## 2011-12-18 ENCOUNTER — Ambulatory Visit (INDEPENDENT_AMBULATORY_CARE_PROVIDER_SITE_OTHER): Payer: Medicare Other | Admitting: Internal Medicine

## 2011-12-18 VITALS — BP 122/64 | HR 49 | Wt 191.0 lb

## 2011-12-18 DIAGNOSIS — E538 Deficiency of other specified B group vitamins: Secondary | ICD-10-CM

## 2011-12-18 DIAGNOSIS — E782 Mixed hyperlipidemia: Secondary | ICD-10-CM | POA: Diagnosis not present

## 2011-12-18 DIAGNOSIS — D649 Anemia, unspecified: Secondary | ICD-10-CM

## 2011-12-18 DIAGNOSIS — I10 Essential (primary) hypertension: Secondary | ICD-10-CM | POA: Diagnosis not present

## 2011-12-18 DIAGNOSIS — E119 Type 2 diabetes mellitus without complications: Secondary | ICD-10-CM

## 2011-12-18 DIAGNOSIS — E559 Vitamin D deficiency, unspecified: Secondary | ICD-10-CM | POA: Diagnosis not present

## 2011-12-18 HISTORY — DX: Anemia, unspecified: D64.9

## 2011-12-18 LAB — CBC WITH DIFFERENTIAL/PLATELET
Basophils Absolute: 0 10*3/uL (ref 0.0–0.1)
Eosinophils Absolute: 0.2 10*3/uL (ref 0.0–0.7)
HCT: 34.3 % — ABNORMAL LOW (ref 36.0–46.0)
Hemoglobin: 11.4 g/dL — ABNORMAL LOW (ref 12.0–15.0)
Lymphs Abs: 1.1 10*3/uL (ref 0.7–4.0)
MCHC: 33.3 g/dL (ref 30.0–36.0)
MCV: 93.4 fl (ref 78.0–100.0)
Monocytes Absolute: 0.4 10*3/uL (ref 0.1–1.0)
Monocytes Relative: 9.5 % (ref 3.0–12.0)
Neutro Abs: 2.7 10*3/uL (ref 1.4–7.7)
RDW: 14.3 % (ref 11.5–14.6)

## 2011-12-18 LAB — CREATININE, SERUM: Creatinine, Ser: 1.1 mg/dL (ref 0.4–1.2)

## 2011-12-18 LAB — MICROALBUMIN / CREATININE URINE RATIO: Microalb Creat Ratio: 0.8 mg/g (ref 0.0–30.0)

## 2011-12-18 NOTE — Assessment & Plan Note (Signed)
Lipids are at goal; this was despite the change in diet in December. Lipids should be monitored in March of 2014

## 2011-12-18 NOTE — Assessment & Plan Note (Signed)
The serial glucoses have been normal to minimally elevated. A1c and urine microalbumin will be checked

## 2011-12-18 NOTE — Progress Notes (Signed)
  Subjective: This is E.     Patient ID: Gloria Joseph, female    DOB: January 15, 1941, 71 y.o.   MRN: [id number]  HPI HYPERTENSION: Disease Monitoring: Blood pressure average-139/65 since spironolactone increased to 50 mg daily by Dr. Johney Frame 5/29. His note was reviewed  Chest pain, palpitations-no       Dyspnea- no Exercise:gym 3X / week on bike, treadmill, walking Medications: Compliance-yes  Lightheadedness,Syncope- no    Edema- only @ end of day or with car trips  DIABETES: Disease Monitoring: Blood Sugar ranges-no home monitor; fasting blood sugars have  ranged from a high of 111 in December 2012 to 90 on 11/13/11  Polyuria/phagia/dipsia- no       Visual problems- no Medications: Compliance- no meds Diet: She wants to resume her [employer] diet; she's been increasing meat intake since her last hospitalization. She believes this was their D/C instructions  Hypoglycemic symptoms- no  HYPERLIPIDEMIA: Disease Monitoring: See symptoms for Hypertension Medications: Compliance-yes  Abd pain, bowel changes-no except chronic constipation  Muscle aches- no       Review of Systems She has gained 30 pounds in the last year. Part of the issue was her inability to exercise with the cardiac problems in late 2012. Her appetite is good; she sleeps well; she denies fatigue. TSH was slightly reduced at 0.9 in March. Hematocrit was 32.5 in December, 2012. She denies dysphagia, melena or, rectal bleeding. B12  was low normal in 12/12.       Objective:   Physical Exam Gen.:  well-nourished in appearance. Alert, appropriate and cooperative throughout exam.  Eyes: No corneal or conjunctival inflammation noted. No icterus Neck: No deformities, masses, or tenderness noted.  Thyroid normal. Lungs: Normal respiratory effort; chest expands symmetrically. Lungs are clear to auscultation without rales, wheezes, or increased work of breathing. Heart: Slow rate and irregullar rhythm. Normal S1  and S2. No gallop, click, or rub. No murmur. Abdomen: Bowel sounds normal; abdomen soft and nontender. No masses, organomegaly or hernias noted.                                                                   Musculoskeletal/extremities: No clubbing, cyanosis, edema, or deformity noted. Tone & strength  normal.Joints normal. Nail health  good. Vascular: Carotid, radial artery, dorsalis pedis and  posterior tibial pulses are full and equal. No bruits present. Neurologic: Alert and oriented x3. Deep tendon reflexes symmetrical and normal.          Skin: Intact without suspicious lesions or rashes. Lymph: No cervical, axillary lymphadenopathy present. Psych: Mood and affect are normal. Normally interactive                                                                                         Assessment & Plan:

## 2011-12-18 NOTE — Assessment & Plan Note (Signed)
Blood pressure appears well controlled on the increased dose of spironolactone. Renal function will be rechecked

## 2011-12-18 NOTE — Assessment & Plan Note (Addendum)
CBC and differential and B12 levels will be checked. 

## 2011-12-18 NOTE — Patient Instructions (Addendum)
Please try to go on My Chart within the next 24 hours to allow me to release the results directly to you.  

## 2012-01-03 ENCOUNTER — Other Ambulatory Visit (INDEPENDENT_AMBULATORY_CARE_PROVIDER_SITE_OTHER): Payer: Medicare Other

## 2012-01-03 DIAGNOSIS — Z1289 Encounter for screening for malignant neoplasm of other sites: Secondary | ICD-10-CM

## 2012-01-03 LAB — POC HEMOCCULT BLD/STL (OFFICE/1-CARD/DIAGNOSTIC): Fecal Occult Blood, POC: NEGATIVE

## 2012-01-28 ENCOUNTER — Other Ambulatory Visit: Payer: Self-pay | Admitting: Internal Medicine

## 2012-01-28 NOTE — Telephone Encounter (Signed)
Refill done.  

## 2012-01-30 ENCOUNTER — Other Ambulatory Visit: Payer: Self-pay | Admitting: Internal Medicine

## 2012-02-27 ENCOUNTER — Other Ambulatory Visit: Payer: Self-pay | Admitting: Internal Medicine

## 2012-02-29 ENCOUNTER — Other Ambulatory Visit: Payer: Self-pay | Admitting: Internal Medicine

## 2012-03-02 ENCOUNTER — Telehealth: Payer: Self-pay | Admitting: Internal Medicine

## 2012-03-02 ENCOUNTER — Other Ambulatory Visit: Payer: Self-pay

## 2012-03-02 MED ORDER — HYDRALAZINE HCL 50 MG PO TABS: ORAL_TABLET | ORAL | Status: DC

## 2012-03-02 MED ORDER — SIMVASTATIN 20 MG PO TABS: 20.0000 mg | ORAL_TABLET | Freq: Every day | ORAL | Status: DC

## 2012-03-02 NOTE — Telephone Encounter (Signed)
Pt calling for samples of xarelto 20mg --advised to come pic up samples

## 2012-03-02 NOTE — Telephone Encounter (Signed)
pt would like for you to call her before the below meds are refilled cb# [phone]   gabapentin (NEURONTIN) 300 MG capsule [Pharmacy Med Name: GABAPENTIN 300 MG CAPSULE] 90 capsule 1 02/29/2012 Sig: TAKE 1 CAPSULE BY MOUTH AT BEDTIME DAW:   No hydrALAZINE (APRESOLINE) 50 MG tablet [Pharmacy Med Name: HYDRALAZINE 50 MG TABLET] 270 tablet 0 02/29/2012 Sig: TAKE 2 TABLETS BY MOUTH EVERY 8 HOURS DAW:   No simvastatin (ZOCOR) 20 MG tablet [Pharmacy Med Name: SIMVASTATIN 20 MG TABLET] 90 tablet 1 02/29/2012 Sig: TAKE 1 TABLET AT BEDTIME DAW: No

## 2012-03-02 NOTE — Telephone Encounter (Signed)
Spoke with patient and she requested that Simvastatin and Hydralazine go to [facility] [place] instead of [facility] for they will be cheaper there until her benefits start over in Jan 2014.

## 2012-03-02 NOTE — Telephone Encounter (Signed)
New Problem:    Patient called in because she now cannot afford to pay for her [place] and would like to know if there was a less expensive option and now is out of her medication.   Please call back.

## 2012-03-05 NOTE — Telephone Encounter (Signed)
Discussed with pt. She states that she is going to contact her insurance company to see if there are certain meds that they will cover or are cheaper & then send [place] a list of approved meds.

## 2012-03-05 NOTE — Telephone Encounter (Signed)
Gloria Joseph 03/05/2012 10:18 AM Pended  Pt called states her medicare has ran out on her medications/part d benefits and she must now pay full price for medication. Pt [facility] and [facility] which was [facility]. Pt wants to know what other options she may have to get this medication at a cheaper price? Please review and call back ph# [phone]  ORIGINALLY ATTACHED TO A CARDIOLOGY NOTE this is the one it should have been tied to. Sorry

## 2012-03-05 NOTE — Telephone Encounter (Signed)
ERROR -- attached to cardiology note a Primary meds issues

## 2012-03-05 NOTE — Telephone Encounter (Signed)
Pt called states her medicare has ran out on her medications/part d benefits and she must now pay full price for medication. Pt [facility] and [facility] which was [facility]. Pt wants to know what other options she may have to get this medication at a cheaper price? Please review and call back ph# [phone]

## 2012-03-06 NOTE — Telephone Encounter (Signed)
[facility] got her medication and no longer needs refill  and she is good but thanks you for the help

## 2012-03-31 NOTE — Telephone Encounter (Signed)
New problem:   Discuss Rx -  Xarelto . In the doughnut hole the cost is $ 360.00 . Need samples, only 5 pills left.

## 2012-03-31 NOTE — Telephone Encounter (Signed)
I have some samples and will leave for the patient out front.  She is in the gap now prior to the gap her co-pay was $34.00

## 2012-04-10 ENCOUNTER — Ambulatory Visit (INDEPENDENT_AMBULATORY_CARE_PROVIDER_SITE_OTHER): Payer: Medicare Other | Admitting: Internal Medicine

## 2012-04-10 ENCOUNTER — Encounter: Payer: Self-pay | Admitting: Internal Medicine

## 2012-04-10 VITALS — BP 148/60 | HR 54 | Ht 67.0 in | Wt 197.4 lb

## 2012-04-10 DIAGNOSIS — I4891 Unspecified atrial fibrillation: Secondary | ICD-10-CM | POA: Diagnosis not present

## 2012-04-10 DIAGNOSIS — I10 Essential (primary) hypertension: Secondary | ICD-10-CM

## 2012-04-10 NOTE — Patient Instructions (Addendum)
Your physician wants you to follow-up in: 6 months with Dr Allred You will receive a reminder letter in the mail two months in advance. If you don't receive a letter, please call our office to schedule the follow-up appointment.   Your physician has recommended you make the following change in your medication:  1) Stop Aspirin   

## 2012-04-12 ENCOUNTER — Encounter: Payer: Self-pay | Admitting: Internal Medicine

## 2012-04-12 NOTE — Assessment & Plan Note (Signed)
Continue xarelto Stop ASA  No other changes

## 2012-04-12 NOTE — Assessment & Plan Note (Signed)
Stable No change required today  

## 2012-04-12 NOTE — Progress Notes (Signed)
PCP: Marga Melnick, MD  Gloria Joseph is a 71 y.o. female who presents today for routine electrophysiology followup.  Since last being seen in our clinic, the patient reports doing very well.  She denies afib. Today, she denies symptoms of palpitations, chest pain, shortness of breath,  lower extremity edema, dizziness, presyncope, or syncope.  The patient is otherwise without complaint today.   Past Medical History  Diagnosis Date  . Diabetes mellitus, type 2     diet controlled  . Hypertension     medicine refractory  . Osteopenia   . Hyperlipidemia   . Cancer     breast CA s/p Right mastectomy  . Hypothyroidism     s/p thryroid gland ablation  . Neuromuscular disorder   . Atrial fibrillation 04/13/2011    on Xarelto for stroke prevention  . Anemia 12/18/2011   Past Surgical History  Procedure Date  . Breast enhancement surgery   . Breast implant removal 2000    Unilaterally   . Cholecystectomy   . Colonoscopy 2002    Neg  . Rai 04/04/2006  . Mastectomy   . Tubal ligation   . Breast surgery     Current Outpatient Prescriptions  Medication Sig Dispense Refill  . AMBULATORY NON FORMULARY MEDICATION Breast Prothesis,  DX: Mastectomy  2 each  0  . gabapentin (NEURONTIN) 300 MG capsule TAKE 1 CAPSULE BY MOUTH AT BEDTIME  90 capsule  1  . hydrALAZINE (APRESOLINE) 50 MG tablet TAKE 2 TABLETS BY MOUTH EVERY 8 HOURS  270 tablet  0  . isosorbide mononitrate (IMDUR) 30 MG 24 hr tablet TAKE 1 TABLET BY MOUTH ONCE DAILY  90 tablet  1  . levothyroxine (SYNTHROID, LEVOTHROID) 112 MCG tablet Take 1 tablet (112 mcg total) by mouth daily. 1 daily except 1/2 on Tues/Thurs/Sat  90 tablet  1  . metoprolol tartrate (LOPRESSOR) 25 MG tablet TAKE 1 TABLET BY MOUTH TWICE DSAILY  180 tablet  1  . minoxidil (LONITEN) 2.5 MG tablet Take 2.5 mg by mouth 2 (two) times daily.      . Rivaroxaban 20 MG TABS Take 20 mg by mouth daily with supper.  30 tablet  11  . simvastatin (ZOCOR) 20 MG tablet  Take 1 tablet (20 mg total) by mouth at bedtime.  90 tablet  1  . spironolactone (ALDACTONE) 50 MG tablet Take 1 tablet (50 mg total) by mouth daily.  90 tablet  3  . [DISCONTINUED] hydrALAZINE (APRESOLINE) 50 MG tablet Take 1.5 tablets (75 mg total) by mouth every 6 (six) hours.  100 tablet  1    Physical Exam: Filed Vitals:   04/10/12 1443  BP: 148/60  Pulse: 54  Height: 5\' 7"  (1.702 m)  Weight: 197 lb 6.4 oz (89.54 kg)  SpO2: 97%    GEN- The patient is well appearing, alert and oriented x 3 today.   Head- normocephalic, atraumatic Eyes-  Sclera clear, conjunctiva pink Ears- hearing intact Oropharynx- clear Lungs- Clear to ausculation bilaterally, normal work of breathing Heart- Regular rate and rhythm, no murmurs, rubs or gallops, PMI not laterally displaced GI- soft, NT, ND, + BS Extremities- no clubbing, cyanosis, or edema  ekg today reveals sinus rhythm 51 bpm, PR 246, QTc 429, otherwise normal ekg  Assessment and Plan:

## 2012-04-13 ENCOUNTER — Ambulatory Visit: Payer: Medicare Other | Admitting: Internal Medicine

## 2012-05-06 ENCOUNTER — Other Ambulatory Visit: Payer: Self-pay | Admitting: Internal Medicine

## 2012-05-06 NOTE — Telephone Encounter (Signed)
Hopp please verify instruction correct for med request

## 2012-05-07 NOTE — Telephone Encounter (Signed)
This was ordered by  A hospitalist or her cardiologist when she was  an inpatient. Please verify the correct dose with her; thank you

## 2012-05-08 NOTE — Telephone Encounter (Signed)
Left message on VM for patient to return call when available  

## 2012-05-11 NOTE — Telephone Encounter (Signed)
Change in chart to 2 pills bid # 360

## 2012-05-11 NOTE — Telephone Encounter (Signed)
Patient states Dr.Hopper is correct she was placed on these medications by Cardiologist, patient states she was told by Cardiologist that Dr.Hopper can monitior B/P. Patient states she is taking 2 by mouth in the am and 2 by mouth in the pm   Hopp please advise

## 2012-05-20 ENCOUNTER — Other Ambulatory Visit: Payer: Self-pay | Admitting: Internal Medicine

## 2012-05-21 NOTE — Telephone Encounter (Signed)
Lipid/Hep 272.4/995.20  

## 2012-05-24 ENCOUNTER — Other Ambulatory Visit: Payer: Self-pay | Admitting: Internal Medicine

## 2012-06-13 DIAGNOSIS — M65839 Other synovitis and tenosynovitis, unspecified forearm: Secondary | ICD-10-CM | POA: Diagnosis not present

## 2012-06-13 DIAGNOSIS — M65849 Other synovitis and tenosynovitis, unspecified hand: Secondary | ICD-10-CM | POA: Diagnosis not present

## 2012-06-16 ENCOUNTER — Telehealth: Payer: Self-pay | Admitting: Internal Medicine

## 2012-06-16 NOTE — Telephone Encounter (Signed)
New Problem    Refill Request Gloria Joseph

## 2012-06-17 ENCOUNTER — Other Ambulatory Visit: Payer: Self-pay

## 2012-06-17 MED ORDER — RIVAROXABAN 20 MG PO TABS: 20.0000 mg | ORAL_TABLET | Freq: Every day | ORAL | Status: DC

## 2012-06-17 NOTE — Telephone Encounter (Signed)
Refill completed.

## 2012-07-04 ENCOUNTER — Other Ambulatory Visit: Payer: Self-pay

## 2012-07-20 ENCOUNTER — Encounter: Payer: Self-pay | Admitting: Lab

## 2012-07-21 ENCOUNTER — Ambulatory Visit: Payer: Medicare Other | Admitting: Internal Medicine

## 2012-07-24 ENCOUNTER — Ambulatory Visit: Payer: Medicare Other | Admitting: Internal Medicine

## 2012-07-28 ENCOUNTER — Encounter: Payer: Self-pay | Admitting: Internal Medicine

## 2012-07-29 ENCOUNTER — Encounter: Payer: Self-pay | Admitting: Internal Medicine

## 2012-07-29 ENCOUNTER — Ambulatory Visit (INDEPENDENT_AMBULATORY_CARE_PROVIDER_SITE_OTHER): Payer: Medicare Other | Admitting: Internal Medicine

## 2012-07-29 VITALS — HR 50 | Wt 206.0 lb

## 2012-07-29 DIAGNOSIS — E782 Mixed hyperlipidemia: Secondary | ICD-10-CM

## 2012-07-29 DIAGNOSIS — E119 Type 2 diabetes mellitus without complications: Secondary | ICD-10-CM | POA: Diagnosis not present

## 2012-07-29 DIAGNOSIS — D649 Anemia, unspecified: Secondary | ICD-10-CM | POA: Diagnosis not present

## 2012-07-29 DIAGNOSIS — E559 Vitamin D deficiency, unspecified: Secondary | ICD-10-CM

## 2012-07-29 DIAGNOSIS — E039 Hypothyroidism, unspecified: Secondary | ICD-10-CM

## 2012-07-29 DIAGNOSIS — E538 Deficiency of other specified B group vitamins: Secondary | ICD-10-CM

## 2012-07-29 DIAGNOSIS — I10 Essential (primary) hypertension: Secondary | ICD-10-CM | POA: Diagnosis not present

## 2012-07-29 LAB — CBC WITH DIFFERENTIAL/PLATELET
Basophils Relative: 0.7 % (ref 0.0–3.0)
Eosinophils Absolute: 0.2 10*3/uL (ref 0.0–0.7)
Eosinophils Relative: 3.7 % (ref 0.0–5.0)
HCT: 35 % — ABNORMAL LOW (ref 36.0–46.0)
Hemoglobin: 12 g/dL (ref 12.0–15.0)
Lymphs Abs: 1.2 10*3/uL (ref 0.7–4.0)
MCHC: 34.2 g/dL (ref 30.0–36.0)
MCV: 91.7 fl (ref 78.0–100.0)
Monocytes Absolute: 0.4 10*3/uL (ref 0.1–1.0)
Neutro Abs: 3.4 10*3/uL (ref 1.4–7.7)
RBC: 3.82 Mil/uL — ABNORMAL LOW (ref 3.87–5.11)
WBC: 5.3 10*3/uL (ref 4.5–10.5)

## 2012-07-29 LAB — MICROALBUMIN / CREATININE URINE RATIO: Microalb, Ur: 0.5 mg/dL (ref 0.0–1.9)

## 2012-07-29 LAB — BASIC METABOLIC PANEL
CO2: 26 mEq/L (ref 19–32)
Chloride: 104 mEq/L (ref 96–112)
Creatinine, Ser: 1.2 mg/dL (ref 0.4–1.2)
Potassium: 4.9 mEq/L (ref 3.5–5.1)
Sodium: 139 mEq/L (ref 135–145)

## 2012-07-29 LAB — HEPATIC FUNCTION PANEL
ALT: 20 U/L (ref 0–35)
AST: 26 U/L (ref 0–37)
Bilirubin, Direct: 0 mg/dL (ref 0.0–0.3)
Total Bilirubin: 0.8 mg/dL (ref 0.3–1.2)

## 2012-07-29 LAB — LIPID PANEL
LDL Cholesterol: 60 mg/dL (ref 0–99)
Total CHOL/HDL Ratio: 3
Triglycerides: 194 mg/dL — ABNORMAL HIGH (ref 0.0–149.0)

## 2012-07-29 LAB — HEMOGLOBIN A1C: Hgb A1c MFr Bld: 6.1 % (ref 4.6–6.5)

## 2012-07-29 NOTE — Patient Instructions (Addendum)
Minimal Blood Pressure Goal= AVERAGE < 140/90;  Ideal is an AVERAGE < 135/85. This AVERAGE should be calculated from @ least 5-7 BP readings taken @ different times of day on different days of week. You should not respond to isolated BP readings , but rather the AVERAGE for that week .Please bring your  blood pressure cuff to office visits to verify that it is reliable.It  can also be checked against the blood pressure device at the pharmacy. Finger or wrist cuffs are not dependable; an arm cuff is.  The normal goal for  Vitamin D is 40-60. Vitamin D deficiency is the # 1 cause of muscle pain in women.Recommended lifestyle interventions for Osteoporosis prevention include calcium 600 mg twice a day  & vitamin D3 supplementation to keep vit D  level @ least 40-60. The usual vitamin D3 dose is 1000 IU daily; but individual dose is determined by annual vitamin D level monitor. Also weight bearing exercise such as  walking 30-45 minutes 3-4  X per week is recommended.

## 2012-07-29 NOTE — Assessment & Plan Note (Signed)
Blood pressure goals discussed. BMET will be checked

## 2012-07-29 NOTE — Assessment & Plan Note (Signed)
CBC and differential should be monitored because of the prior anemia and the novel anticoagulant therapy

## 2012-07-29 NOTE — Assessment & Plan Note (Signed)
Fasting lipids will be checked; these had previously been at goal

## 2012-07-29 NOTE — Progress Notes (Signed)
Subjective:    Patient ID: Gloria Joseph, female    DOB: June 29, 1940, 72 y.o.   MRN: [id number]  HPI The patient is here for followup of diabetes hyperlipidemia and hypertension.  The most recent A1c was 5.6 on 12/18/11  , which correlates to an average sugar of 114, and long-term risk of 12%. Fasting blood sugar not monitored .  Highest two-hour postprandial not monitored . No hypoglycemia reported Last ophthalmologic examination 03/2012 and revealed no retinopathy. No active podiatry assessment on record. Diet is low carb sugar restricted heart healthy. Has had a 40 lb weight gain over past year and rejoined [employer] last month. Exercises regularly 3 X/ week as walking CVE for 30 minutes.  The most recent lipids on 12/18/11 reveal LDL 53; HDL 61.7; and triglycerides 99 . There is medical compliance with the statin.   Blood pressure range  average 144/70 . There is medical  compliance with antihypertensive medications         Review of Systems Constitutional: No excess fatigue Eyes: No  blurred vision;  double vision ; loss of vision Cardiovascular: no chest pain ;palpitations; racing; irregular rhythm ;syncope; claudication ; edema  Respiratory: No exertional dyspnea;  paroxysmal nocturnal dyspnea Genitourinary: No dysuria; hematuria ; pyuria; frequency;  Incontinence;  nocturia Musculoskeletal: No myalgias or muscle cramping  Dermatologic: No non healing  Lesions; change in color or temperature of skin Neurologic: No  limb weakness;  numbness or tingling;  burning Endocrine: No change in hair, skin, nails. No excessive thirst; excessive hunger;  excessive urination      Objective:   Physical Exam Gen.:  well-nourished in appearance. Alert, appropriate and cooperative throughout exam.Appears younger than stated age  Head: Normocephalic without obvious abnormalities.  Eyes: No corneal or conjunctival inflammation noted.  Mouth: Oral mucosa and oropharynx reveal  no lesions or exudates. Teeth in very poor repair with caries. Neck: No deformities, masses, or tenderness noted.  Thyroid normal Lungs: Normal respiratory effort; chest expands symmetrically. Lungs are clear to auscultation without rales, wheezes, or increased work of breathing. Heart: Normal rate and rhythm. Normal S1 and S2. No gallop, click, or rub. Grade 1/2 over 6 systolic murmur. Abdomen: Bowel sounds normal; abdomen soft and nontender. No masses, organomegaly or hernias noted.                                  Musculoskeletal/extremities: Slightly accentuated upper  thoracic spine curvature. No clubbing, cyanosis, edema, or significant extremity  deformity noted. Range of motion normal .Tone & strength  Normal. Joints normal . Crepitus knees. Nail health good. Able to lie down & sit up w/o help. Negative SLR bilaterally Vascular: Carotid, radial artery, dorsalis pedis and  posterior tibial pulses are full and equal. No bruits present. Neurologic: Alert and oriented x3. Deep tendon reflexes symmetrical and normal.   Light touch normal over feet.       Skin: Intact without suspicious lesions or rashes. Keratosis L temple Lymph: No cervical, axillary lymphadenopathy present. Psych: Mood and affect are normal. Normally interactive  Assessment & Plan:

## 2012-07-29 NOTE — Assessment & Plan Note (Signed)
Her last A1c was nondiabetic; A1c and urine microalbumin will be updated. Status ieeds to be verified because of the significant weight gain in the past year

## 2012-07-31 ENCOUNTER — Other Ambulatory Visit: Payer: Self-pay | Admitting: Internal Medicine

## 2012-07-31 DIAGNOSIS — Z79899 Other long term (current) drug therapy: Secondary | ICD-10-CM | POA: Diagnosis not present

## 2012-08-02 ENCOUNTER — Other Ambulatory Visit: Payer: Self-pay | Admitting: Internal Medicine

## 2012-08-03 MED ORDER — HYDRALAZINE HCL 50 MG PO TABS: ORAL_TABLET | ORAL | Status: DC

## 2012-08-03 MED ORDER — GABAPENTIN 300 MG PO CAPS: ORAL_CAPSULE | ORAL | Status: DC

## 2012-08-03 MED ORDER — SIMVASTATIN 20 MG PO TABS: ORAL_TABLET | ORAL | Status: DC

## 2012-08-25 ENCOUNTER — Encounter: Payer: Self-pay | Admitting: Internal Medicine

## 2012-09-03 ENCOUNTER — Other Ambulatory Visit: Payer: Self-pay | Admitting: Internal Medicine

## 2012-09-07 ENCOUNTER — Other Ambulatory Visit: Payer: Self-pay | Admitting: Internal Medicine

## 2012-09-28 ENCOUNTER — Telehealth: Payer: Self-pay | Admitting: Internal Medicine

## 2012-09-28 NOTE — Telephone Encounter (Signed)
Pt would like to know if Melatonin would be a good thing to take to help her sleep better. Wants to make sure that this is ok to take with all of her other medications. Pt is adamant about not taking any sleeping pills due to her mom being addicted to them. Please advise.

## 2012-09-28 NOTE — Telephone Encounter (Signed)
Pt.notified

## 2012-09-28 NOTE — Telephone Encounter (Signed)
This is okay; it is usually most effective with time zone related sleep disruption.

## 2012-09-28 NOTE — Telephone Encounter (Signed)
Pt is still having issues with not sleeping well- doesn't want to take a rx sleeping pill, would like to talk about melatonin

## 2012-10-08 ENCOUNTER — Encounter: Payer: Self-pay | Admitting: Internal Medicine

## 2012-10-08 ENCOUNTER — Ambulatory Visit (INDEPENDENT_AMBULATORY_CARE_PROVIDER_SITE_OTHER): Payer: Medicare Other | Admitting: Internal Medicine

## 2012-10-08 VITALS — BP 140/74 | HR 59 | Ht 69.0 in | Wt 212.0 lb

## 2012-10-08 DIAGNOSIS — I10 Essential (primary) hypertension: Secondary | ICD-10-CM | POA: Diagnosis not present

## 2012-10-08 DIAGNOSIS — I4891 Unspecified atrial fibrillation: Secondary | ICD-10-CM | POA: Diagnosis not present

## 2012-10-08 DIAGNOSIS — I498 Other specified cardiac arrhythmias: Secondary | ICD-10-CM

## 2012-10-08 DIAGNOSIS — R001 Bradycardia, unspecified: Secondary | ICD-10-CM

## 2012-10-08 NOTE — Progress Notes (Signed)
PCP: Marga Melnick, MD  Gloria Joseph is a 72 y.o. female who presents today for routine electrophysiology followup.  Since last being seen in our clinic, the patient reports doing very well.  She is unaware of any afib. Today, she denies symptoms of palpitations, chest pain, shortness of breath,  lower extremity edema, dizziness, presyncope, or syncope.  The patient is otherwise without complaint today.   Past Medical History  Diagnosis Date  . Diabetes mellitus, type 2     diet controlled  . Hypertension     medicine refractory  . Osteopenia   . Hyperlipidemia   . Cancer     breast CA s/p Right mastectomy  . Hypothyroidism     s/p thryroid gland ablation  . Neuromuscular disorder   . Atrial fibrillation 04/13/2011    on Xarelto for stroke prevention  . Anemia 12/18/2011   Past Surgical History  Procedure Laterality Date  . Breast enhancement surgery    . Breast implant removal  2000    Unilaterally   . Cholecystectomy    . Colonoscopy  2002    Neg  . Rai  04/04/2006  . Mastectomy    . Tubal ligation    . Breast surgery      Current Outpatient Prescriptions  Medication Sig Dispense Refill  . AMBULATORY NON FORMULARY MEDICATION Breast Prothesis,  DX: Mastectomy  2 each  0  . gabapentin (NEURONTIN) 300 MG capsule TAKE 1 CAPSULE BY MOUTH AT BEDTIME  90 capsule  3  . gabapentin (NEURONTIN) 300 MG capsule TAKE 1 CAPSULE BY MOUTH AT BEDTIME  90 capsule  0  . gabapentin (NEURONTIN) 300 MG capsule TAKE 1 CAPSULE BY MOUTH AT BEDTIME  90 capsule  0  . hydrALAZINE (APRESOLINE) 50 MG tablet 2 by mouth twice daily  360 tablet  3  . isosorbide mononitrate (IMDUR) 30 MG 24 hr tablet TAKE 1 TABLET BY MOUTH ONCE DAILY  90 tablet  1  . levothyroxine (SYNTHROID, LEVOTHROID) 112 MCG tablet TAKE 1 TABLET BY MOUTH DAILY EXCEPT 1/2 ON TUES, THURS, AND SAT.  90 tablet  2  . metoprolol tartrate (LOPRESSOR) 25 MG tablet 1 by mouth twice daily  180 tablet  2  . minoxidil (LONITEN) 2.5 MG  tablet Take 2.5 mg by mouth 2 (two) times daily.      . Rivaroxaban (XARELTO) 20 MG TABS Take 1 tablet (20 mg total) by mouth daily with supper.  30 tablet  11  . simvastatin (ZOCOR) 20 MG tablet 1 by mouth qhs  90 tablet  3  . spironolactone (ALDACTONE) 50 MG tablet Take 1 tablet (50 mg total) by mouth daily.  90 tablet  3   No current facility-administered medications for this visit.    Physical Exam: Filed Vitals:   10/08/12 1049  BP: 140/74  Pulse: 59  Height: 5\' 9"  (1.753 m)  Weight: 212 lb (96.163 kg)  SpO2: 98%    GEN- The patient is overweight appearing, alert and oriented x 3 today.   Head- normocephalic, atraumatic Eyes-  Sclera clear, conjunctiva pink Ears- hearing intact Oropharynx- clear Lungs- Clear to ausculation bilaterally, normal work of breathing Heart- Regular rate and rhythm, no murmurs, rubs or gallops, PMI not laterally displaced GI- soft, NT, ND, + BS Extremities- no clubbing, cyanosis, or edema  ekg today reveals sinus bradycardia 49 bpm, LVH  Assessment and Plan: 1. afib Stable No change required today  2. Bradycardia Asymptomatic No changes  3. HTN Stable No  change required today  Return in 1 year

## 2012-10-08 NOTE — Patient Instructions (Addendum)
Your physician wants you to follow-up in: 12 months with Dr Allred You will receive a reminder letter in the mail two months in advance. If you don't receive a letter, please call our office to schedule the follow-up appointment.  

## 2012-11-04 ENCOUNTER — Other Ambulatory Visit: Payer: Self-pay | Admitting: Internal Medicine

## 2012-11-17 ENCOUNTER — Other Ambulatory Visit: Payer: Self-pay | Admitting: Internal Medicine

## 2012-11-28 ENCOUNTER — Emergency Department (HOSPITAL_COMMUNITY): Payer: Medicare Other

## 2012-11-28 ENCOUNTER — Emergency Department (HOSPITAL_COMMUNITY)
Admission: EM | Admit: 2012-11-28 | Discharge: 2012-11-28 | Disposition: A | Discharge status: Home / Self Care | Payer: Medicare Other | Attending: Emergency Medicine | Admitting: Emergency Medicine

## 2012-11-28 ENCOUNTER — Encounter (HOSPITAL_COMMUNITY): Payer: Self-pay | Admitting: Emergency Medicine

## 2012-11-28 DIAGNOSIS — E119 Type 2 diabetes mellitus without complications: Secondary | ICD-10-CM | POA: Diagnosis not present

## 2012-11-28 DIAGNOSIS — Z880 Allergy status to penicillin: Secondary | ICD-10-CM | POA: Insufficient documentation

## 2012-11-28 DIAGNOSIS — S99929A Unspecified injury of unspecified foot, initial encounter: Secondary | ICD-10-CM | POA: Diagnosis not present

## 2012-11-28 DIAGNOSIS — E039 Hypothyroidism, unspecified: Secondary | ICD-10-CM | POA: Diagnosis not present

## 2012-11-28 DIAGNOSIS — Z8739 Personal history of other diseases of the musculoskeletal system and connective tissue: Secondary | ICD-10-CM | POA: Insufficient documentation

## 2012-11-28 DIAGNOSIS — Y9289 Other specified places as the place of occurrence of the external cause: Secondary | ICD-10-CM | POA: Insufficient documentation

## 2012-11-28 DIAGNOSIS — Y9389 Activity, other specified: Secondary | ICD-10-CM | POA: Insufficient documentation

## 2012-11-28 DIAGNOSIS — W19XXXA Unspecified fall, initial encounter: Secondary | ICD-10-CM

## 2012-11-28 DIAGNOSIS — R42 Dizziness and giddiness: Secondary | ICD-10-CM | POA: Insufficient documentation

## 2012-11-28 DIAGNOSIS — Z8669 Personal history of other diseases of the nervous system and sense organs: Secondary | ICD-10-CM | POA: Diagnosis not present

## 2012-11-28 DIAGNOSIS — Z853 Personal history of malignant neoplasm of breast: Secondary | ICD-10-CM | POA: Insufficient documentation

## 2012-11-28 DIAGNOSIS — M25569 Pain in unspecified knee: Secondary | ICD-10-CM | POA: Diagnosis not present

## 2012-11-28 DIAGNOSIS — Z8679 Personal history of other diseases of the circulatory system: Secondary | ICD-10-CM | POA: Diagnosis not present

## 2012-11-28 DIAGNOSIS — R52 Pain, unspecified: Secondary | ICD-10-CM | POA: Diagnosis not present

## 2012-11-28 DIAGNOSIS — Z79899 Other long term (current) drug therapy: Secondary | ICD-10-CM | POA: Insufficient documentation

## 2012-11-28 DIAGNOSIS — Z862 Personal history of diseases of the blood and blood-forming organs and certain disorders involving the immune mechanism: Secondary | ICD-10-CM | POA: Insufficient documentation

## 2012-11-28 DIAGNOSIS — I10 Essential (primary) hypertension: Secondary | ICD-10-CM | POA: Diagnosis not present

## 2012-11-28 DIAGNOSIS — S8000XA Contusion of unspecified knee, initial encounter: Secondary | ICD-10-CM

## 2012-11-28 DIAGNOSIS — E785 Hyperlipidemia, unspecified: Secondary | ICD-10-CM | POA: Insufficient documentation

## 2012-11-28 DIAGNOSIS — W108XXA Fall (on) (from) other stairs and steps, initial encounter: Secondary | ICD-10-CM | POA: Insufficient documentation

## 2012-11-28 LAB — CBC WITH DIFFERENTIAL/PLATELET
Basophils Absolute: 0 10*3/uL (ref 0.0–0.1)
HCT: 31.3 % — ABNORMAL LOW (ref 36.0–46.0)
Lymphocytes Relative: 10 % — ABNORMAL LOW (ref 12–46)
Lymphs Abs: 0.9 10*3/uL (ref 0.7–4.0)
Neutro Abs: 7.5 10*3/uL (ref 1.7–7.7)
Platelets: 200 10*3/uL (ref 150–400)
RBC: 3.51 MIL/uL — ABNORMAL LOW (ref 3.87–5.11)
RDW: 13.4 % (ref 11.5–15.5)
WBC: 8.9 10*3/uL (ref 4.0–10.5)

## 2012-11-28 LAB — URINALYSIS, ROUTINE W REFLEX MICROSCOPIC
Glucose, UA: NEGATIVE mg/dL
Specific Gravity, Urine: 1.013 (ref 1.005–1.030)
pH: 5.5 (ref 5.0–8.0)

## 2012-11-28 LAB — BASIC METABOLIC PANEL
CO2: 24 mEq/L (ref 19–32)
Chloride: 99 mEq/L (ref 96–112)
Glucose, Bld: 143 mg/dL — ABNORMAL HIGH (ref 70–99)
Sodium: 132 mEq/L — ABNORMAL LOW (ref 135–145)

## 2012-11-28 LAB — TROPONIN I: Troponin I: <0.3 ng/mL (ref <0.30)

## 2012-11-28 LAB — GLUCOSE, CAPILLARY: Glucose-Capillary: 135 mg/dL — ABNORMAL HIGH (ref 70–99)

## 2012-11-28 LAB — URINE MICROSCOPIC-ADD ON

## 2012-11-28 MED ORDER — ONDANSETRON 4 MG PO TBDP
8.0000 mg | ORAL_TABLET | Freq: Once | ORAL | Status: AC
  Administered 2012-11-28: 8 mg via ORAL

## 2012-11-28 MED ORDER — ACETAMINOPHEN 325 MG PO TABS: 650.0000 mg | ORAL_TABLET | Freq: Once | ORAL | Status: DC

## 2012-11-28 MED ORDER — RIVAROXABAN 20 MG PO TABS
20.0000 mg | ORAL_TABLET | Freq: Once | ORAL | Status: AC
  Administered 2012-11-28: 20 mg via ORAL
  Filled 2012-11-28 (×2): qty 1

## 2012-11-28 MED ORDER — SODIUM CHLORIDE 0.9 % IV BOLUS (SEPSIS)
500.0000 mL | Freq: Once | INTRAVENOUS | Status: AC
  Administered 2012-11-28: 500 mL via INTRAVENOUS

## 2012-11-28 MED ORDER — ONDANSETRON 4 MG PO TBDP
ORAL_TABLET | ORAL | Status: AC
  Filled 2012-11-28: qty 2

## 2012-11-28 NOTE — ED Notes (Signed)
Gave pt 2 liters of oxygen (nasal cannula) due to shortness of breath

## 2012-11-28 NOTE — ED Provider Notes (Signed)
Medical screening examination/treatment/procedure(s) were performed by non-physician practitioner and as supervising physician I was immediately available for consultation/collaboration. Devoria Albe, MD, Armando Gang   Ward Givens, MD 11/28/12 2020

## 2012-11-28 NOTE — ED Notes (Signed)
Pt was carrying some small boxes down stairs and missed the last step.  Pt fell around 10am today, bilateral abrasions and swelling to knees. Pt unable to ambulate. Hx a fib, takes xarelto.

## 2012-11-28 NOTE — ED Notes (Signed)
Son Irineo Axon [phone] Please call when pt is ready for d/c

## 2012-11-28 NOTE — ED Provider Notes (Signed)
I was available for consultation during the completion of this patient's course.  I checked on her once.  She is awake and alert, hemodynamically stable.  She requested her evening antiplatelet medication and this was provided  Gerhard Munch, MD 11/28/12 2319

## 2012-11-28 NOTE — ED Provider Notes (Signed)
Awaiting lab values, IV fluid bolus EKG than will again attempt ambulation  ED ECG REPORT   Date: 11/28/2012  EKG Time: 10:38 PM  Rate: 68  Rhythm: normal sinus rhythm,  unchanged from previous tracings  Axis: normal  Intervals:first-degree A-V block   ST&T Change: none  Narrative Interpretation: abnormal but unchanged    Patient ambulated without difficulty  Knees sore, no syncope            Arman Filter, NP 11/28/12 2239

## 2012-11-28 NOTE — ED Provider Notes (Signed)
History    CSN: [id number] Arrival date & time 11/28/12  1653  First MD Initiated Contact with Patient 11/28/12 1659     Chief Complaint  Patient presents with  . Fall  . Knee Injury   (Consider location/radiation/quality/duration/timing/severity/associated sxs/prior Treatment) Patient is a 72 y.o. female presenting with fall. The history is provided by the patient. No language interpreter was used.  Fall This is a new problem. The current episode started today. Pertinent negatives include no chest pain, chills, fever or neck pain. Associated symptoms comments: She is in the process of moving and was carrying 2 small boxes down a flight of stairs when she fell forward onto her hands and knees. She denies hitting her head or current headache. She denies any dizziness, nausea, chest pain prior to fall. Since falling she has had some dizziness. .   Past Medical History  Diagnosis Date  . Diabetes mellitus, type 2     diet controlled  . Hypertension     medicine refractory  . Osteopenia   . Hyperlipidemia   . Cancer     breast CA s/p Right mastectomy  . Hypothyroidism     s/p thryroid gland ablation  . Neuromuscular disorder   . Atrial fibrillation 04/13/2011    on Xarelto for stroke prevention  . Anemia 12/18/2011   Past Surgical History  Procedure Laterality Date  . Breast enhancement surgery    . Breast implant removal  2000    Unilaterally   . Cholecystectomy    . Colonoscopy  2002    Neg  . Rai  04/04/2006  . Mastectomy    . Tubal ligation    . Breast surgery     Family History  Problem Relation Age of Onset  . Depression Mother   . Alcohol abuse Father   . Heart attack Brother 65  . Cancer Other     Uncle, not specific  . Cancer Other     Aunt, not specific. GYN CA  . Breast cancer Other     Aunt, not specific  . Diabetes Other     Grandmother, not specific  . Cancer Maternal Grandfather     colon   History  Substance Use Topics  . Smoking status:  Never Smoker   . Smokeless tobacco: Never Used  . Alcohol Use: No   OB History   Grav Para Term Preterm Abortions TAB SAB Ect Mult Living                 Review of Systems  Constitutional: Negative for fever and chills.  HENT: Negative.  Negative for neck pain.   Respiratory: Negative.  Negative for shortness of breath.   Cardiovascular: Negative.  Negative for chest pain.  Gastrointestinal: Negative.   Genitourinary: Negative for dysuria.  Musculoskeletal:       Bruising to knees bilaterally with swelling.  Skin: Positive for color change. Negative for wound.  Neurological: Positive for dizziness and light-headedness.  Psychiatric/Behavioral: Negative for confusion.    Allergies  Lisinopril; Hctz; Spironolactone; Penicillins; and Amlodipine besylate  Home Medications   Current Outpatient Rx  Name  Route  Sig  Dispense  Refill  . AMBULATORY NON FORMULARY MEDICATION      Breast Prothesis,  DX: Mastectomy   2 each   0   . hydrALAZINE (APRESOLINE) 50 MG tablet      2 by mouth twice daily   360 tablet   3   . isosorbide mononitrate (IMDUR) 30  MG 24 hr tablet      TAKE 1 TABLET BY MOUTH ONCE DAILY   90 tablet   1   . levothyroxine (SYNTHROID, LEVOTHROID) 112 MCG tablet      TAKE 1 TABLET BY MOUTH DAILY EXCEPT 1/2 ON TUES, THURS, AND SAT.   90 tablet   2     NEEDS REFILLS   . metoprolol tartrate (LOPRESSOR) 25 MG tablet      1 by mouth twice daily   180 tablet   2   . minoxidil (LONITEN) 2.5 MG tablet   Oral   Take 2.5 mg by mouth 2 (two) times daily.         . Rivaroxaban (XARELTO) 20 MG TABS   Oral   Take 1 tablet (20 mg total) by mouth daily with supper.   30 tablet   11   . simvastatin (ZOCOR) 20 MG tablet      1 by mouth qhs   90 tablet   3   . spironolactone (ALDACTONE) 50 MG tablet      TAKE 1 TABLET BY MOUTH ONCE DAILY   90 tablet   3   . spironolactone (ALDACTONE) 50 MG tablet      TAKE 1 TABLET BY MOUTH ONCE DAILY   90  tablet   3    BP 148/62  Pulse 81  Temp(Src) 99.7 F (37.6 C) (Oral)  Resp 18  SpO2 99% Physical Exam  Constitutional: She is oriented to person, place, and time. She appears well-developed and well-nourished.  HENT:  Head: Normocephalic.  Neck: Normal range of motion. Neck supple.  Cardiovascular: Normal rate and regular rhythm.   Pulmonary/Chest: Effort normal and breath sounds normal.  Abdominal: Soft. Bowel sounds are normal. There is no tenderness. There is no rebound and no guarding.  Musculoskeletal: Normal range of motion.  Neurological: She is alert and oriented to person, place, and time.  Skin: Skin is warm and dry. No rash noted.  Psychiatric: She has a normal mood and affect.    ED Course  Procedures (including critical care time) Labs Reviewed - No data to display No results found. No diagnosis found.  MDM  X-rays of knees are negative for fracture. When ambulating patient on reassessment, she became lightheaded, diaphoretic, nauseous and had vomiting. Zofran given. Additional labs and IV with small bolus ordered. Patient care transferred to Earley Favor, NP, pending labs.   Arnoldo Hooker, PA-C 11/28/12 2010

## 2012-11-28 NOTE — ED Notes (Signed)
Pt's son Doneisha [place] cell [phone]. Would like to be updated with information when available.

## 2012-11-30 LAB — URINE CULTURE

## 2012-12-07 ENCOUNTER — Ambulatory Visit (INDEPENDENT_AMBULATORY_CARE_PROVIDER_SITE_OTHER): Payer: Medicare Other | Admitting: Internal Medicine

## 2012-12-07 ENCOUNTER — Encounter: Payer: Self-pay | Admitting: Internal Medicine

## 2012-12-07 ENCOUNTER — Telehealth: Payer: Self-pay | Admitting: Internal Medicine

## 2012-12-07 VITALS — BP 140/70 | HR 54 | Temp 98.5°F | Wt 216.0 lb

## 2012-12-07 DIAGNOSIS — I480 Paroxysmal atrial fibrillation: Secondary | ICD-10-CM | POA: Insufficient documentation

## 2012-12-07 DIAGNOSIS — S8992XD Unspecified injury of left lower leg, subsequent encounter: Secondary | ICD-10-CM

## 2012-12-07 DIAGNOSIS — Z5189 Encounter for other specified aftercare: Secondary | ICD-10-CM | POA: Diagnosis not present

## 2012-12-07 DIAGNOSIS — I4891 Unspecified atrial fibrillation: Secondary | ICD-10-CM | POA: Diagnosis not present

## 2012-12-07 MED ORDER — TRAMADOL HCL 50 MG PO TABS: 50.0000 mg | ORAL_TABLET | Freq: Four times a day (QID) | ORAL | Status: DC | PRN

## 2012-12-07 NOTE — Progress Notes (Signed)
Subjective:    Patient ID: Gloria Joseph, female    DOB: Nov 08, 1940, 72 y.o.   MRN: [id number]  HPI  The emergency room 11/28/12 records were reviewed. In the emergency room she experienced near-syncope associated with nausea vomiting and blood pressure drop requiring Zofran and IV fluids.She was not monitored @ that time. She fell down one step carrying 2 small boxes. She states that everything "blacked out"  & her only  memory was severe pain in her left leg from blunt trauma. Cardiac & neurologic prodrome denied.  She states that this was reminiscent of symptoms she had with the onset of atrial fibrillation. Those episodes occurred while sitting and were not associated with musculoskeletal injury. She is on Xarelto prophylactically. Her cardiologist had wanted to use this multiple anticoagulant as a trial prior to considering a pacemaker.  The EKG was reviewed; it revealed normal sinus rhythm.  She has residual swelling and ecchymosis greater in the right leg than the left. She has residual pain along the lateral aspect of the right leg.    Review of Systems  Prior to fall there was no lightheadedness ;sensation of room spinning; orsensation of near fainting Symptoms not related to position change (she had been busy for several hours);rotation of neck to either side;looking up over head;straining ; pain;heart palpitations;Irregular heart ; or rhythm or rate change . No headache;weakness in arms  or legs;numbness &/or tingling in arm or leg.  No associated seizure activity .  No symptoms of fever , chills, sweats , nausea, or change in weight. No sinus pressure,discolored nasal secretions,hearing loss,ringing in the ears,earache ordischarge from ears. No blurred vision ,double vision or loss of vision prior to event No acute shortness of breath ,cough, hemoptysis        Objective:   Physical Exam Gen.:  well-nourished in appearance. Alert, appropriate and cooperative throughout  exam. Head: Normocephalic without obvious abnormalities Eyes: No corneal or conjunctival inflammation noted. FOV WNL. Extraocular motion intact. No nystagmus. Vision grossly normal with lenses Ears: External  ear exam reveals no significant lesions or deformities. Canals clear .TMs normal. Hearing is grossly normal bilaterally. Tuning fork exam normal Nose: External nasal exam reveals no deformity or inflammation. Nasal mucosa are pink and moist. No lesions or exudates noted.  Mouth: Oral mucosa and oropharynx reveal no lesions or exudates. Teeth in good repair. Neck: No deformities, masses, or tenderness noted. Range of motion normal Lungs: Normal respiratory effort; chest expands symmetrically. Lungs are clear to auscultation without rales, wheezes, or increased work of breathing. Heart: Normal rate and rhythm. Normal S1 ; accentuated S2. No gallop, click, or rub. Grade 1/2- 1 over 6 systolic murmur Abdomen: Bowel sounds normal; abdomen soft and nontender. No masses, organomegaly or hernias noted.                                  Musculoskeletal/extremities: Accentuated curvature of upper thoracic  Spine.  No clubbing, cyanosis or significant extremity  deformity noted. Range of motion normal .Tone & strength  Normal. 1/2+ edema left ankle. Slight fluctuance over the left knee without evidence of active cellulitis or effusion. Crepitus of the knees bilaterally. Tenderness to palpation over the lateral aspect of the right leg Joints normal . Nail health good. Able to lie down & sit up with minimal help. Negative SLR bilaterally Vascular: Carotid, radial artery, dorsalis pedis and  posterior tibial pulses are full and equal.  No bruits present. Neurologic: Alert and oriented x3.         Skin: Faint ecchymosis over the entire left leg from knee to ankle Lymph: No cervical, axillary lymphadenopathy present. Psych: Mood and affect are normal. Normally interactive                                                                                         Assessment & Plan:  #1 blunt trauma to both legs with residual ecchymosis and edema left lower extremity. The Xarelto would be prophylactic for deep venous thrombosis.  #2 fall in the context of similar episodes of loss of consciousness with paroxysmal atrial fibrillation. Cardiology reassessment indicated. An event monitor will be ordered.

## 2012-12-07 NOTE — Telephone Encounter (Signed)
Patient Information:  Caller Name: Ianna  Phone: [phone]  Patient: Gloria Joseph  Gender: Female  DOB: 02-22-1941  Age: 72 Years  PCP: Marga Melnick  Office Follow Up:  Does the office need to follow up with this patient?: No  Instructions For The Office: N/A   Symptoms  Reason For Call & Symptoms: Sat 7/12 was feeling very ill, having chills,   backed out and fell on knees.  Family called 911 and seen in ER with Xrays both knees okay.  Walking in ER passed out again, given IVs as BP became low.  After able to walk again discharged home.  Both knees bruised, Left leg and knee much worse.  Feels like elastic band around knee, redness on knee with the bruising and some of this going down leg.  Reviewed Health History In EMR: Yes  Reviewed Medications In EMR: Yes  Reviewed Allergies In EMR: Yes  Reviewed Surgeries / Procedures: Yes  Date of Onset of Symptoms: 11/28/2012  Treatments Tried: elevating legs, Tylenol, trying to walk  Treatments Tried Worked: No  Guideline(s) Used:  Knee Injury  Disposition Per Guideline:   See Today in Office  Reason For Disposition Reached:   Patient wants to be seen  Advice Given:  Elevate the Leg:  Lay down and put your leg on a pillow. This puts (elevates) the knee above the heart.  Call Back If:  Pain becomes severe  You become worse.  Patient Will Follow Care Advice:  YES  Appointment Scheduled:  12/07/2012 16:30:00 Appointment Scheduled Provider:  Marga Melnick

## 2012-12-07 NOTE — Telephone Encounter (Signed)
Noted, patient with pending appointment today. Per MD protocol if patient with pending appointment ok to close encounter

## 2012-12-07 NOTE — Patient Instructions (Addendum)
Elevation is critical for the bruising to resolve. Use warm moist compresses 3 times a day as discussed. 

## 2012-12-16 ENCOUNTER — Encounter: Payer: Self-pay | Admitting: *Deleted

## 2012-12-16 ENCOUNTER — Encounter (INDEPENDENT_AMBULATORY_CARE_PROVIDER_SITE_OTHER): Payer: Medicare Other

## 2012-12-16 DIAGNOSIS — R55 Syncope and collapse: Secondary | ICD-10-CM

## 2012-12-16 DIAGNOSIS — I480 Paroxysmal atrial fibrillation: Secondary | ICD-10-CM

## 2012-12-16 DIAGNOSIS — I4891 Unspecified atrial fibrillation: Secondary | ICD-10-CM | POA: Diagnosis not present

## 2012-12-16 NOTE — Progress Notes (Signed)
Patient ID: Gloria Joseph, female   DOB: [birth year], 72 y.o.   MRN: [id number] E-cardio verite 30 day cardiac event monitor applied to patient.

## 2012-12-17 ENCOUNTER — Ambulatory Visit: Payer: Medicare Other | Admitting: Internal Medicine

## 2012-12-22 DIAGNOSIS — S32509A Unspecified fracture of unspecified pubis, initial encounter for closed fracture: Secondary | ICD-10-CM | POA: Diagnosis not present

## 2013-01-06 ENCOUNTER — Telehealth: Payer: Self-pay | Admitting: Internal Medicine

## 2013-01-06 NOTE — Telephone Encounter (Signed)
New problem   Pt need you to call her concerning a situation about the heart monitor.

## 2013-01-15 NOTE — Telephone Encounter (Signed)
Left message for patient to return my call.

## 2013-01-15 NOTE — Telephone Encounter (Signed)
F/u    Pt returning your call.please call pt

## 2013-01-15 NOTE — Telephone Encounter (Signed)
01-14-13 pt calling back , never heard from [place], however she also had a med question and would like a call from [facility] re that

## 2013-01-15 NOTE — Telephone Encounter (Signed)
Left message for patient to call me back. 

## 2013-01-19 ENCOUNTER — Telehealth: Payer: Self-pay | Admitting: Internal Medicine

## 2013-01-19 NOTE — Telephone Encounter (Signed)
Spoke with patient and the Xarelto is too expensive.  She had a blackout spell in July and her BP had dropped which caused a hip fracture.  She wants to change medications but I let her know I would leave her some samples out front

## 2013-01-19 NOTE — Telephone Encounter (Signed)
See phone note from 01/19/13

## 2013-01-19 NOTE — Telephone Encounter (Signed)
Pt called last week and played telephone tag with you, promises she will keep her phone with her today

## 2013-01-22 NOTE — Telephone Encounter (Signed)
Spoke with patient and let her know Dr Johney Frame reviewed her monitor.  He suggested to stop her Metoprolol and follow up with Nehemiah Settle in one month to see how she is feeling.  She is aware and will do so.  I will have Melissa call her on Monday

## 2013-01-26 ENCOUNTER — Telehealth: Payer: Self-pay | Admitting: Internal Medicine

## 2013-01-26 NOTE — Telephone Encounter (Signed)
Follow Up   Pt returned call,

## 2013-01-27 NOTE — Telephone Encounter (Signed)
Rtn nurse call.  °

## 2013-01-27 NOTE — Telephone Encounter (Signed)
Gloria Joseph was calling to schedule her appointment.  She is going to call her back

## 2013-01-29 ENCOUNTER — Telehealth: Payer: Self-pay | Admitting: Cardiology

## 2013-01-29 MED ORDER — METOPROLOL TARTRATE 12.5 MG HALF TABLET: 12.5000 mg | ORAL_TABLET | Freq: Two times a day (BID) | ORAL | Status: DC

## 2013-01-29 NOTE — Telephone Encounter (Signed)
Talked to patient she is complaining of heart racing.   She took a 25mg  Metoprolol and it subsided.   She is going to restart the Metoprolol 12.5mg  twice daily, this is half of her previous dose.  She has a follow up scheduled for 02/09/13 with Nehemiah Settle.  She will call back if needed

## 2013-01-29 NOTE — Telephone Encounter (Signed)
New Problem  pt was asked to come off of a RX and is having a reaction (heart is racing) and want to know if its due to the withdrwal of the medication

## 2013-02-08 NOTE — Progress Notes (Signed)
ELECTROPHYSIOLOGY OFFICE NOTE  Patient ID: Gloria Joseph MRN: [id number], DOB/AGE: 1941-03-12   Date of Visit: 02/09/2013  Primary Physician: Marga Melnick, MD Primary Cardiologist: Hillis Range, MD Reason for Visit: EP/device follow-up  History of Present Illness  Gloria Joseph is a 72 y.o. female with PAF, bradycardia, HTN and DM who presents today for routine electrophysiology followup after restarting metoprolol. Since last being seen in our clinic, she reports her palpitations have improved. She continues to have fatigue / lack of energy which has been ongoing x 6 months. She has no other complaints today. She denies chest pain or shortness of breath. She denies palpitations, dizziness, near syncope or syncope. She denies LE swelling, orthopnea, PND or recent weight gain. She is compliant and tolerating medications without difficulty.  Past Medical History Past Medical History  Diagnosis Date  . Diabetes mellitus, type 2     diet controlled  . Hypertension     medicine refractory  . Osteopenia   . Hyperlipidemia   . Cancer     breast CA s/p Right mastectomy  . Hypothyroidism     s/p thryroid gland ablation  . Neuromuscular disorder   . Atrial fibrillation 04/13/2011    on Xarelto for stroke prevention  . Anemia 12/18/2011    Past Surgical History Past Surgical History  Procedure Laterality Date  . Breast enhancement surgery    . Breast implant removal  2000    Unilaterally   . Cholecystectomy    . Colonoscopy  2002    Neg  . Rai  04/04/2006  . Mastectomy    . Tubal ligation    . Breast surgery      Allergies/Intolerances Allergies  Allergen Reactions  . Lisinopril     ? Angioedema (NOT ARB candidate)  . Hctz [Hydrochlorothiazide]     hyponatremia  . Spironolactone     REACTION: low potassium ???  . Penicillins Other (See Comments)    Pt states medication doesn't work for her d/t her usage of it several years ago.  . Amlodipine Besylate    REACTION: edema    Current Home Medications Current Outpatient Prescriptions  Medication Sig Dispense Refill  . AMBULATORY NON FORMULARY MEDICATION Breast Prothesis,  DX: Mastectomy  2 each  0  . gabapentin (NEURONTIN) 300 MG capsule Take 300 mg by mouth at bedtime.      . hydrALAZINE (APRESOLINE) 50 MG tablet Take 100 mg by mouth 2 (two) times daily.      . isosorbide mononitrate (IMDUR) 30 MG 24 hr tablet Take 30 mg by mouth daily.      Marland Kitchen levothyroxine (SYNTHROID, LEVOTHROID) 112 MCG tablet Take 56-112 mcg by mouth daily. Take 1/2 tablet (56 mcg) on Tues, Thurs, and Sat. All other days take 1 tablet (112 mcg_      . metoprolol tartrate (LOPRESSOR) 12.5 mg TABS tablet Take 0.5 tablets (12.5 mg total) by mouth 2 (two) times daily.      . Rivaroxaban (XARELTO) 20 MG TABS Take 1 tablet (20 mg total) by mouth daily with supper.  30 tablet  11  . simvastatin (ZOCOR) 20 MG tablet Take 20 mg by mouth at bedtime.      Marland Kitchen spironolactone (ALDACTONE) 50 MG tablet Take 50 mg by mouth daily.      . traMADol (ULTRAM) 50 MG tablet Take 1 tablet (50 mg total) by mouth every 6 (six) hours as needed for pain.  30 tablet  0   No current facility-administered  medications for this visit.    Social History Social History  . Marital Status: Divorced   Social History Main Topics  . Smoking status: Never Smoker   . Smokeless tobacco: Never Used  . Alcohol Use: No  . Drug Use: No   Review of Systems General: No chills, fever, night sweats or weight changes Cardiovascular: No chest pain, dyspnea on exertion, edema, orthopnea, palpitations, paroxysmal nocturnal dyspnea Dermatological: No rash, lesions or masses Respiratory: No cough, dyspnea Urologic: No hematuria, dysuria Abdominal: No nausea, vomiting, diarrhea, bright red blood per rectum, melena, or hematemesis Neurologic: No visual changes, weakness, changes in mental status All other systems reviewed and are otherwise negative except as noted  above.  Physical Exam Vitals: Blood pressure 148/74, pulse 50, height 5\' 9"  (1.753 m), weight 211 lb (95.709 kg), SpO2 97.00%.  General: Well developed, well appearing 72 y.o. female in no acute distress. HEENT: Normocephalic, atraumatic. EOMs intact. Sclera nonicteric. Oropharynx clear.  Neck: Supple without bruits. No JVD. Lungs: Respirations regular and unlabored, CTA bilaterally. No wheezes, rales or rhonchi. Heart: RRR. S1, S2 present. No murmurs, rub, S3 or S4. Abdomen: Soft, non-distended.  Extremities: No clubbing, cyanosis or edema. PT/Radials 2+ and equal bilaterally. Psych: Normal affect. Neuro: Alert and oriented X 3. Moves all extremities spontaneously.   Diagnostics 12-lead ECG today - sinus bradycardia at 51 bpm with first degree AV block; unchanged from previous ECGs  Assessment and Plan 1. Sinus bradycardia - question if symptomatic with fatigue - has not worsened since restarting metoprolol - consider repeating 48 hr Holter to document rate and evaluate for chronotropic incompetence - discussed indications for PPM briefly - will confer with Dr. Johney Frame 2. PAF - stable; palpitations improved on metoprolol - continue rate control with metoprolol and Xarelto for stroke risk reduction  Signed, Jauan Wohl, PA-C 02/09/2013, 11:10 AM

## 2013-02-09 ENCOUNTER — Ambulatory Visit (INDEPENDENT_AMBULATORY_CARE_PROVIDER_SITE_OTHER): Payer: Medicare Other | Admitting: Cardiology

## 2013-02-09 ENCOUNTER — Encounter: Payer: Self-pay | Admitting: Cardiology

## 2013-02-09 VITALS — BP 148/74 | HR 50 | Ht 69.0 in | Wt 211.0 lb

## 2013-02-09 DIAGNOSIS — R5381 Other malaise: Secondary | ICD-10-CM | POA: Diagnosis not present

## 2013-02-09 DIAGNOSIS — R001 Bradycardia, unspecified: Secondary | ICD-10-CM

## 2013-02-09 DIAGNOSIS — R002 Palpitations: Secondary | ICD-10-CM

## 2013-02-09 DIAGNOSIS — I4891 Unspecified atrial fibrillation: Secondary | ICD-10-CM | POA: Diagnosis not present

## 2013-02-09 DIAGNOSIS — R5383 Other fatigue: Secondary | ICD-10-CM

## 2013-02-09 DIAGNOSIS — I480 Paroxysmal atrial fibrillation: Secondary | ICD-10-CM

## 2013-02-09 DIAGNOSIS — I498 Other specified cardiac arrhythmias: Secondary | ICD-10-CM | POA: Diagnosis not present

## 2013-02-09 NOTE — Patient Instructions (Signed)
WE GIVE YOU XARELTO SAMPLES TODAY

## 2013-02-11 ENCOUNTER — Telehealth: Payer: Self-pay | Admitting: Cardiology

## 2013-02-11 ENCOUNTER — Telehealth: Payer: Self-pay | Admitting: Internal Medicine

## 2013-02-11 ENCOUNTER — Other Ambulatory Visit: Payer: Self-pay | Admitting: Internal Medicine

## 2013-02-11 ENCOUNTER — Other Ambulatory Visit: Payer: Self-pay | Admitting: Cardiology

## 2013-02-11 DIAGNOSIS — R5383 Other fatigue: Secondary | ICD-10-CM

## 2013-02-11 NOTE — Telephone Encounter (Signed)
New problem    Returning call back to Robin [place] PA

## 2013-02-11 NOTE — Telephone Encounter (Signed)
Spoke with patient regarding Dr. Jenel Lucks recommendations. She will be scheduled for a treadmill Myoview stress test in the next 1-2 weeks. She expressed verbal understanding and agrees with this plan of care.

## 2013-02-11 NOTE — Telephone Encounter (Signed)
Discussed plan of care with Dr. Johney Frame who has recommended a GXT to evaluate heart rate response to exertion. I tried calling Ms. Rideaux to discuss this with her but was unable to speak with her. I left a message for her to return my call.

## 2013-02-12 NOTE — Telephone Encounter (Signed)
Med filled.  

## 2013-02-22 ENCOUNTER — Encounter: Payer: Self-pay | Admitting: Cardiology

## 2013-02-23 ENCOUNTER — Ambulatory Visit (HOSPITAL_COMMUNITY): Payer: Medicare Other | Attending: Internal Medicine | Admitting: Radiology

## 2013-02-23 VITALS — BP 142/92 | HR 78 | Ht 69.0 in | Wt 218.0 lb

## 2013-02-23 DIAGNOSIS — E119 Type 2 diabetes mellitus without complications: Secondary | ICD-10-CM | POA: Diagnosis not present

## 2013-02-23 DIAGNOSIS — I10 Essential (primary) hypertension: Secondary | ICD-10-CM | POA: Diagnosis not present

## 2013-02-23 DIAGNOSIS — Z8249 Family history of ischemic heart disease and other diseases of the circulatory system: Secondary | ICD-10-CM | POA: Diagnosis not present

## 2013-02-23 DIAGNOSIS — R5381 Other malaise: Secondary | ICD-10-CM | POA: Diagnosis not present

## 2013-02-23 DIAGNOSIS — E785 Hyperlipidemia, unspecified: Secondary | ICD-10-CM | POA: Insufficient documentation

## 2013-02-23 DIAGNOSIS — J45909 Unspecified asthma, uncomplicated: Secondary | ICD-10-CM | POA: Diagnosis not present

## 2013-02-23 DIAGNOSIS — R0602 Shortness of breath: Secondary | ICD-10-CM | POA: Insufficient documentation

## 2013-02-23 DIAGNOSIS — R5383 Other fatigue: Secondary | ICD-10-CM

## 2013-02-23 DIAGNOSIS — R42 Dizziness and giddiness: Secondary | ICD-10-CM | POA: Diagnosis not present

## 2013-02-23 DIAGNOSIS — I440 Atrioventricular block, first degree: Secondary | ICD-10-CM | POA: Diagnosis not present

## 2013-02-23 DIAGNOSIS — I4891 Unspecified atrial fibrillation: Secondary | ICD-10-CM

## 2013-02-23 MED ORDER — REGADENOSON 0.4 MG/5ML IV SOLN
0.4000 mg | Freq: Once | INTRAVENOUS | Status: AC
  Administered 2013-02-23: 0.4 mg via INTRAVENOUS

## 2013-02-23 MED ORDER — TECHNETIUM TC 99M SESTAMIBI GENERIC - CARDIOLITE
11.0000 | Freq: Once | INTRAVENOUS | Status: AC | PRN
  Administered 2013-02-23: 11 via INTRAVENOUS

## 2013-02-23 MED ORDER — TECHNETIUM TC 99M SESTAMIBI GENERIC - CARDIOLITE
33.0000 | Freq: Once | INTRAVENOUS | Status: AC | PRN
  Administered 2013-02-23: 33 via INTRAVENOUS

## 2013-02-23 NOTE — Progress Notes (Signed)
[facility] SITE 3 NUCLEAR MED [address] [place], [place] [address] [phone]    Cardiology Nuclear Med Study  Gloria Joseph is a 72 y.o. female     MRN : [id number]     DOB: 01-09-1941  Procedure Date: 02/23/2013  Nuclear Med Background Indication for Stress Test:  Evaluation for Ischemia History:  '12 Echo:EF=55%,mild LVH;Asthma;Recent dx of AFib, Cardiac Risk Factors: Family History - CAD, Hypertension, Lipids and NIDDM  Symptoms:  Fatigue with and with Exertion,Lightt-Headedness and SOB   Nuclear Pre-Procedure Caffeine/Decaff Intake:  None NPO After: 8:00pm    O2 Sat: 98% on room air. IV 0.9% NS with Angio Cath:  22g  IV Site: R Antecubital  IV Started by:  Rickard Patience  Chest Size (in):  42 Cup Size: C  Height: 5\' 9"  (1.753 m)  Weight:  218 lb (98.884 kg)  BMI:  Body mass index is 32.18 kg/(m^2). Tech Comments:  Lopressor taken at 2100 02/23/13.Patient felt lightheaded,dizzy. BP 138/78 and CBG 103.    Nuclear Med Study 1 or 2 day study: 1 day  Stress Test Type:  Eugenie Birks  Reading MD: Cassell Clement, MD  Order Authorizing Provider:  Jarold Song  Resting Radionuclide: Technetium 66m Sestamibi  Resting Radionuclide Dose: 11.0 mCi   Stress Radionuclide:  Technetium 76m Sestamibi  Stress Radionuclide Dose: 33.0 mCi           Stress Protocol Rest HR: 78 Stress HR: 101  Rest BP: 142/92 Stress BP: 155/54  Exercise Time (min): n/a METS: n/a   Predicted Max HR: 148 bpm % Max HR: 104.73 bpm Rate Pressure Product: 56387   Dose of Adenosine (mg):  n/a Dose of Lexiscan: 0.4 mg  Dose of Atropine (mg):    Dose of Dobutamine: n/a mcg/kg/min (at max HR)  Stress Test Technologist: Nelson Chimes, BS-ES  Nuclear Technologist:  Doyne Keel, CNMT     Rest Procedure:  Myocardial perfusion imaging was performed at rest 45 minutes following the intravenous administration of Technetium 63m Sestamibi.  Rest ECG: NSR - Normal EKG  Stress Procedure:   The patient received IV Lexiscan 0.4 mg over 15-seconds.  Technetium 29m Sestamibi injected at 30-seconds.  She c/o shortness of breath, lightheadedness and feeling "strange " with Lexiscan.  Quantitative spect images were obtained after a 45 minute delay.  Stress ECG: No significant change from baseline ECG  QPS Raw Data Images:  Normal; no motion artifact; normal heart/lung ratio. Stress Images:  Normal homogeneous uptake in all areas of the myocardium. Rest Images:  Normal homogeneous uptake in all areas of the myocardium. Subtraction (SDS):  No evidence of ischemia. Transient Ischemic Dilatation (Normal <1.22):  N/A Lung/Heart Ratio (Normal <0.45): 0.36  Quantitative Gated Spect Images QGS EDV:  84 ml QGS ESV:  32 ml  Impression Exercise Capacity:  Lexiscan with no exercise. BP Response:  Normal blood pressure response. Clinical Symptoms:  No significant symptoms noted. ECG Impression:  No significant ST segment change suggestive of ischemia. Comparison with Prior Nuclear Study: No previous nuclear study performed  Overall Impression:  Normal stress nuclear study.  LV Ejection Fraction: 67%.  LV Wall Motion:  NL LV Function; NL Wall Motion   [employer]

## 2013-02-24 NOTE — Progress Notes (Signed)
Nuclear Medicine Stress Test changed to Lexiscan based on the patients symptoms of lightheadedness, dizziness and inability to walk on a treadmill.

## 2013-03-01 NOTE — Telephone Encounter (Signed)
New problem   Pt want to know results of treadmill test.

## 2013-03-02 NOTE — Telephone Encounter (Signed)
According to the nuclear medicine result note, Dennis Bast, RN spoke with the patient on 02/26/2013 at 11:43 AM to inform her of the test results. Please see the nuc med note. Please let me know if there are any questions. Thank you!

## 2013-03-02 NOTE — Telephone Encounter (Signed)
She has an appointment with Dr Johney Frame on [employer]

## 2013-03-04 ENCOUNTER — Encounter: Payer: Self-pay | Admitting: Internal Medicine

## 2013-03-04 ENCOUNTER — Ambulatory Visit (INDEPENDENT_AMBULATORY_CARE_PROVIDER_SITE_OTHER): Payer: Medicare Other | Admitting: Internal Medicine

## 2013-03-04 VITALS — BP 148/70 | HR 63 | Ht 67.0 in | Wt 212.8 lb

## 2013-03-04 DIAGNOSIS — G4710 Hypersomnia, unspecified: Secondary | ICD-10-CM

## 2013-03-04 DIAGNOSIS — R5381 Other malaise: Secondary | ICD-10-CM

## 2013-03-04 DIAGNOSIS — I4891 Unspecified atrial fibrillation: Secondary | ICD-10-CM

## 2013-03-04 DIAGNOSIS — E039 Hypothyroidism, unspecified: Secondary | ICD-10-CM

## 2013-03-04 DIAGNOSIS — I498 Other specified cardiac arrhythmias: Secondary | ICD-10-CM | POA: Diagnosis not present

## 2013-03-04 DIAGNOSIS — R001 Bradycardia, unspecified: Secondary | ICD-10-CM

## 2013-03-04 DIAGNOSIS — R5383 Other fatigue: Secondary | ICD-10-CM

## 2013-03-04 DIAGNOSIS — I10 Essential (primary) hypertension: Secondary | ICD-10-CM

## 2013-03-04 DIAGNOSIS — R400 Somnolence: Secondary | ICD-10-CM

## 2013-03-04 NOTE — Patient Instructions (Signed)
Your physician recommends that you schedule a follow-up appointment in: 2 months with Gloria Joseph  Your physician has recommended that you have a sleep study. This test records several body functions during sleep, including: brain activity, eye movement, oxygen and carbon dioxide blood levels, heart rate and rhythm, breathing rate and rhythm, the flow of air through your mouth and nose, snoring, body muscle movements, and chest and belly movement.

## 2013-03-04 NOTE — Progress Notes (Signed)
PCP:  Marga Melnick, MD  The patient presents today for routine electrophysiology followup.  Since last being seen in our clinic, the patient reports that her energy level has not changed.  She is still short of breath with minimal exertion.  She underwent evaluation with nuclear testing which demonstrated an EF of 67% with no ischemia/infarct.  She was unable to walk on the treadmill due to dizziness.  Today with hall walk in the office, her heart rate increased from 63 to 90 with activity with no change in oxygen saturation.     Today, she denies symptoms of palpitations, chest pain, shortness of breath, orthopnea, PND, lower extremity edema, dizziness,  or neurologic sequela.  The patient feels that she is tolerating medications without difficulties and is otherwise without complaint today.   Past Medical History  Diagnosis Date  . Diabetes mellitus, type 2     diet controlled  . Hypertension     medicine refractory  . Osteopenia   . Hyperlipidemia   . Cancer     breast CA s/p Right mastectomy  . Hypothyroidism     s/p thryroid gland ablation  . Neuromuscular disorder   . Atrial fibrillation 04/13/2011    on Xarelto for stroke prevention  . Anemia 12/18/2011   Past Surgical History  Procedure Laterality Date  . Breast enhancement surgery    . Breast implant removal  2000    Unilaterally   . Cholecystectomy    . Colonoscopy  2002    Neg  . Rai  04/04/2006  . Mastectomy    . Tubal ligation    . Breast surgery      Current Outpatient Prescriptions  Medication Sig Dispense Refill  . AMBULATORY NON FORMULARY MEDICATION Breast Prothesis,  DX: Mastectomy  2 each  0  . gabapentin (NEURONTIN) 300 MG capsule Take 300 mg by mouth at bedtime.      . hydrALAZINE (APRESOLINE) 50 MG tablet Take 100 mg by mouth 2 (two) times daily.      . isosorbide mononitrate (IMDUR) 30 MG 24 hr tablet Take 30 mg by mouth daily.      Marland Kitchen levothyroxine (SYNTHROID, LEVOTHROID) 112 MCG tablet TAKE 1  TABLET DAILY EXCEPT 1/2 TABLET ON TUESDAYS, THURSDAYS, AND SATURDAYS  90 tablet  1  . metoprolol tartrate (LOPRESSOR) 12.5 mg TABS tablet Take 0.5 tablets (12.5 mg total) by mouth 2 (two) times daily.      . Rivaroxaban (XARELTO) 20 MG TABS Take 1 tablet (20 mg total) by mouth daily with supper.  30 tablet  11  . simvastatin (ZOCOR) 20 MG tablet Take 20 mg by mouth at bedtime.      Marland Kitchen spironolactone (ALDACTONE) 50 MG tablet Take 50 mg by mouth daily.      . traMADol (ULTRAM) 50 MG tablet Take 1 tablet (50 mg total) by mouth every 6 (six) hours as needed for pain.  30 tablet  0   No current facility-administered medications for this visit.    Allergies  Allergen Reactions  . Lisinopril     ? Angioedema (NOT ARB candidate)  . Hctz [Hydrochlorothiazide]     hyponatremia  . Spironolactone     REACTION: low potassium ???  . Penicillins Other (See Comments)    Pt states medication doesn't work for her d/t her usage of it several years ago.  . Amlodipine Besylate     REACTION: edema    History   Social History  . Marital Status: Divorced  Spouse Name: N/A    Number of Children: N/A  . Years of Education: N/A   Occupational History  . Not on file.   Social History Main Topics  . Smoking status: Never Smoker   . Smokeless tobacco: Never Used  . Alcohol Use: No  . Drug Use: No  . Sexual Activity: No   Other Topics Concern  . Not on file   Social History Narrative  . No narrative on file    Family History  Problem Relation Age of Onset  . Depression Mother   . Alcohol abuse Father   . Heart attack Brother 65  . Cancer Other     Uncle, not specific  . Cancer Other     Aunt, not specific. GYN CA  . Breast cancer Other     Aunt, not specific  . Diabetes Other     Grandmother, not specific  . Cancer Maternal Grandfather     colon    ROS-  All systems are reviewed and are negative except as outlined in the HPI above  Physical Exam: Filed Vitals:   03/04/13 1407    BP: 148/70  Pulse: 63  Height: 5\' 7"  (1.702 m)  Weight: 212 lb 12.8 oz (96.525 kg)    GEN- The patient is well appearing, alert and oriented x 3 today.   Head- normocephalic, atraumatic Eyes-  Sclera clear, conjunctiva pink Ears- hearing intact Oropharynx- clear Neck- supple, no JVP Lymph- no cervical lymphadenopathy Lungs- Clear to ausculation bilaterally, normal work of breathing Heart- Regular rate and rhythm, no murmurs, rubs or gallops, PMI not laterally displaced GI- soft, NT, ND, + BS Extremities- no clubbing, cyanosis, or edema MS- no significant deformity or atrophy Skin- no rash or lesion Psych- euthymic mood, full affect Neuro- strength and sensation are intact  ekg today reveals sinus rhythm 63 bpm, PR 230 msec, otherwise normal ekg  Assessment and Plan:  1. afib Well controlled No changes  2. Bradycardia I am not convinced that she is symptomatic with this No clear indication for pacing  3. Fatigue and daytime somnolence Will perform sleep study to evaluate PCP to follow hypothyroidism Regular activity is encouraged  4. Postural dizziness Adequate hydration advised Consider decreasing spironolactone if not improved  Return to see Nehemiah Settle in 2 months

## 2013-03-10 ENCOUNTER — Encounter: Payer: Self-pay | Admitting: Internal Medicine

## 2013-03-10 ENCOUNTER — Ambulatory Visit (INDEPENDENT_AMBULATORY_CARE_PROVIDER_SITE_OTHER): Payer: Medicare Other | Admitting: Internal Medicine

## 2013-03-10 VITALS — BP 159/69 | HR 51 | Temp 98.5°F | Resp 16 | Wt 210.5 lb

## 2013-03-10 DIAGNOSIS — I10 Essential (primary) hypertension: Secondary | ICD-10-CM | POA: Diagnosis not present

## 2013-03-10 DIAGNOSIS — D649 Anemia, unspecified: Secondary | ICD-10-CM

## 2013-03-10 DIAGNOSIS — E039 Hypothyroidism, unspecified: Secondary | ICD-10-CM

## 2013-03-10 DIAGNOSIS — R5381 Other malaise: Secondary | ICD-10-CM

## 2013-03-10 DIAGNOSIS — E119 Type 2 diabetes mellitus without complications: Secondary | ICD-10-CM | POA: Diagnosis not present

## 2013-03-10 LAB — BASIC METABOLIC PANEL
Chloride: 102 mEq/L (ref 96–112)
Creatinine, Ser: 1.1 mg/dL (ref 0.4–1.2)
Potassium: 4.1 mEq/L (ref 3.5–5.1)
Sodium: 137 mEq/L (ref 135–145)

## 2013-03-10 LAB — CBC WITH DIFFERENTIAL/PLATELET
Basophils Relative: 0.6 % (ref 0.0–3.0)
Eosinophils Relative: 3.3 % (ref 0.0–5.0)
MCV: 90.9 fl (ref 78.0–100.0)
Monocytes Absolute: 0.3 10*3/uL (ref 0.1–1.0)
Neutrophils Relative %: 63.2 % (ref 43.0–77.0)
RBC: 3.91 Mil/uL (ref 3.87–5.11)
WBC: 4.6 10*3/uL (ref 4.5–10.5)

## 2013-03-10 LAB — HEPATIC FUNCTION PANEL
ALT: 15 U/L (ref 0–35)
Alkaline Phosphatase: 57 U/L (ref 39–117)
Bilirubin, Direct: 0 mg/dL (ref 0.0–0.3)
Total Protein: 7.5 g/dL (ref 6.0–8.3)

## 2013-03-10 LAB — TSH: TSH: 0.5 u[IU]/mL (ref 0.35–5.50)

## 2013-03-10 LAB — IBC PANEL: Saturation Ratios: 22.6 % (ref 20.0–50.0)

## 2013-03-10 NOTE — Progress Notes (Signed)
Subjective:    Patient ID: Gloria Joseph, female    DOB: 1940-07-01, 72 y.o.   MRN: [id number]  HPI   She describes constant fatigue for the last 6 months. This precludes her regular exercise program. She has seen her cardiologist. He had initially her beta blocker because of profound bradycardia.  but had restarted it at half dose because of subsequent tachycardia.  She has a sleep study scheduled for 03/26/13. There is no reported apnea or excessive snoring.  In July she had extensive testing @ ER. Abnormalities included a sodium of 132, GFR 51, hematocrit 31.3 and nonfasting glucose of 143. Her last TSH on record was 0.55 in March of this year.    Review of Systems  She does have blurred vision which she attributes to cataracts.  She has intolerance to cold.  She does have edema of LLE @ night. Bowel changes including constipation. She also describes some imbalance. She describes abnormal bruising (on Xarelto) without associated bleeding or enlarged lymph nodes.  She is not having fever or weight loss.  She also denies diplopia or frank vision loss  She has no extrinsic symptoms of itchy, watery eyes, sneezing. She had angioedema only with an ACE inhibitor.  There is no associated hoarseness or dysphagia.  Despite the changes in heart rate outlined above she is not have associated dyspnea, cough, or sputum production  There is no diarrhea, melena,or  rectal bleeding.  She is not having swelling or redness of the joints.  There's been no described change in hair, skin or nails.       Objective:   Physical Exam  Gen.: Adequately nourished in appearance. Alert, appropriate and cooperative throughout exam.Appears younger than stated age  Head: Normocephalic without obvious abnormalities;no alopecia  Eyes: No corneal or conjunctival inflammation noted.  Extraocular motion intact. No lid lag or proptosis Ears: External  ear exam reveals no significant lesions or  deformities. Canals clear .TMs normal. Hearing is grossly normal bilaterally. Nose: External nasal exam reveals no deformity or inflammation. Nasal mucosa are pink and moist. No lesions or exudates noted.  Mouth: Oral mucosa and oropharynx reveal no lesions or exudates. Teeth in poor repair; caries present. Neck: No deformities, masses, or tenderness noted. Range of motion & Thyroid normal. Lungs: Normal respiratory effort; chest expands symmetrically. Lungs are clear to auscultation without rales, wheezes, or increased work of breathing. Heart: Normal rate and rhythm. Normal S1 and S2. No gallop, click, or rub. Grade 1/2 over 6 systolic murmur  Abdomen: Bowel sounds normal; abdomen soft and nontender. No masses, organomegaly or hernias noted.                                 Musculoskeletal/extremities: No deformity or scoliosis noted of  the thoracic or lumbar spine.  No clubbing, cyanosis, edema, or significant extremity  deformity noted. Range of motion normal .Tone & strength  Normal. Joints normal . Nail health good. Crepitus of knees Able to lie down & sit up w/o help. Negative SLR bilaterally Vascular: Carotid, radial artery, dorsalis pedis and  posterior tibial pulses are full and equal. No bruits present. Neurologic: Alert and oriented x3. Deep tendon reflexes symmetrical and normal.       Skin: Intact without suspicious lesions or rashes. Lymph: No cervical, axillary lymphadenopathy present. Psych: Mood and affect are normal. Normally interactive  Assessment & Plan:  #1 fatigue #2 PMH anemia #3 elevated glucose See orders

## 2013-03-10 NOTE — Patient Instructions (Signed)
Minimal Blood Pressure Goal= AVERAGE < 140/90;  Ideal is an AVERAGE < 135/85. This AVERAGE should be calculated from @ least 5-7 BP readings taken @ different times of day on different days of week. You should not respond to isolated BP readings , but rather the AVERAGE for that week .Please bring your  blood pressure cuff to office visits to verify that it is reliable.It  can also be checked against the blood pressure device at the pharmacy. 

## 2013-03-26 ENCOUNTER — Telehealth: Payer: Self-pay | Admitting: *Deleted

## 2013-03-26 DIAGNOSIS — E119 Type 2 diabetes mellitus without complications: Secondary | ICD-10-CM | POA: Diagnosis not present

## 2013-03-26 DIAGNOSIS — E11319 Type 2 diabetes mellitus with unspecified diabetic retinopathy without macular edema: Secondary | ICD-10-CM | POA: Diagnosis not present

## 2013-03-26 DIAGNOSIS — H2510 Age-related nuclear cataract, unspecified eye: Secondary | ICD-10-CM | POA: Diagnosis not present

## 2013-03-26 DIAGNOSIS — H43819 Vitreous degeneration, unspecified eye: Secondary | ICD-10-CM | POA: Diagnosis not present

## 2013-03-26 NOTE — Telephone Encounter (Signed)
Patient called requesting Xarelto samples. Samples left at front desk for pick up. Patient aware. 

## 2013-03-30 ENCOUNTER — Ambulatory Visit (HOSPITAL_BASED_OUTPATIENT_CLINIC_OR_DEPARTMENT_OTHER): Payer: Medicare Other | Attending: Internal Medicine | Admitting: Radiology

## 2013-03-30 VITALS — Ht 67.0 in | Wt 210.0 lb

## 2013-03-30 DIAGNOSIS — G4733 Obstructive sleep apnea (adult) (pediatric): Secondary | ICD-10-CM | POA: Diagnosis not present

## 2013-03-30 DIAGNOSIS — R400 Somnolence: Secondary | ICD-10-CM

## 2013-03-30 DIAGNOSIS — R5383 Other fatigue: Secondary | ICD-10-CM

## 2013-03-30 DIAGNOSIS — I4891 Unspecified atrial fibrillation: Secondary | ICD-10-CM

## 2013-04-02 DIAGNOSIS — R5383 Other fatigue: Secondary | ICD-10-CM

## 2013-04-02 DIAGNOSIS — G4733 Obstructive sleep apnea (adult) (pediatric): Secondary | ICD-10-CM

## 2013-04-02 DIAGNOSIS — G4710 Hypersomnia, unspecified: Secondary | ICD-10-CM

## 2013-04-02 DIAGNOSIS — R5381 Other malaise: Secondary | ICD-10-CM

## 2013-04-02 DIAGNOSIS — I4891 Unspecified atrial fibrillation: Secondary | ICD-10-CM

## 2013-04-02 NOTE — Procedures (Signed)
NAMECHESNEE, FLOREN             ACCOUNT NO.:  [id number]  MEDICAL RECORD NO.:  [id number]          PATIENT TYPE:  OUT  LOCATION:  SLEEP CENTER                 FACILITY:  [facility]  PHYSICIAN:  Barbaraann Share, MD,FCCPDATE OF BIRTH:  1940/06/[birth year]  DATE OF STUDY:  03/30/2013                           NOCTURNAL POLYSOMNOGRAM  REFERRING PHYSICIAN:  Hillis Range, MD  LOCATION:  Sleep Lab.  REFERRING PHYSICIAN:  Hillis Range, MD  INDICATION FOR STUDY:  Hypersomnia with sleep apnea.  EPWORTH SLEEPINESS SCORE:  3.  SLEEP ARCHITECTURE:  The patient had a total sleep time of 246 minutes with decreased quantity of slow wave sleep as well as REM.  Sleep onset latency was normal at 29 minutes and REM onset was very prolonged at 324 minutes.  Sleep efficiency was poor at 67%.  RESPIRATORY DATA:  The patient was found to have 12 obstructive and central apneas as well as 38 obstructive hypopneas, giving her an apnea- hypopnea index of 12 events per hour.  The events occur primarily in the supine position, and there was loud snoring at times during the night.  OXYGEN DATA:  There was O2 desaturation as low as 87% with her obstructive events.  CARDIAC DATA:  The patient appeared to be in normal sinus rhythm throughout the night, with occasional PACs noted.  MOVEMENT/PARASOMNIA:  The patient had no significant leg jerks or other abnormal behaviors seen.  IMPRESSION/RECOMMENDATION: 1. Mild obstructive sleep apnea/hypopnea syndrome, with an AHI of 12     events per hour and oxygen desaturation as low as 87%.  Treatment     for this degree of sleep apnea can include a trial of weight loss     alone, upper airway surgery, dental appliance, and also CPAP.     Clinical correlation is suggested 2. Premature atrial contraction noted at times during the night, but     no clinically significant arrhythmias were seen.     Barbaraann Share, MD,FCCP Diplomate, American Board of  Sleep Medicine    [facility]/MED[facility]  D:  04/02/2013 08:35:06  T:  04/02/2013 22:40:29  Job:  [id number]

## 2013-04-09 ENCOUNTER — Telehealth: Payer: Self-pay | Admitting: *Deleted

## 2013-04-09 NOTE — Telephone Encounter (Signed)
Patient requests xarelto samples. Patient aware they will be left at front desk for pick up.

## 2013-05-04 ENCOUNTER — Encounter: Payer: Self-pay | Admitting: Cardiology

## 2013-05-04 ENCOUNTER — Ambulatory Visit (INDEPENDENT_AMBULATORY_CARE_PROVIDER_SITE_OTHER): Payer: Medicare Other | Admitting: Cardiology

## 2013-05-04 VITALS — BP 142/68 | HR 60 | Ht 67.0 in | Wt 215.1 lb

## 2013-05-04 DIAGNOSIS — R5381 Other malaise: Secondary | ICD-10-CM

## 2013-05-04 DIAGNOSIS — I4891 Unspecified atrial fibrillation: Secondary | ICD-10-CM

## 2013-05-04 DIAGNOSIS — R001 Bradycardia, unspecified: Secondary | ICD-10-CM

## 2013-05-04 DIAGNOSIS — I498 Other specified cardiac arrhythmias: Secondary | ICD-10-CM

## 2013-05-04 DIAGNOSIS — R5383 Other fatigue: Secondary | ICD-10-CM

## 2013-05-04 NOTE — Patient Instructions (Signed)
Your physician recommends that you schedule a follow-up appointment in: 3 MONTHS WITH DR. ALLRED  Your physician recommends that you continue on your current medications as directed. Please refer to the Current Medication list given to you today.   

## 2013-05-04 NOTE — Progress Notes (Addendum)
ELECTROPHYSIOLOGY OFFICE NOTE  Patient ID: Gloria Joseph MRN: [id number], DOB/AGE: 22-Apr-1941   Date of Visit: 05/04/2013  Primary Physician: Gloria Melnick, MD Primary Cardiologist: Gloria Range, MD Reason for Visit: EP/device follow-up  History of Present Illness  Gloria Joseph is a very pleasant 72 year old woman with PAF, bradycardia, HTN and DM who presents today for routine electrophysiology followup. She underwent evaluation with nuclear stress testing which demonstrated an EF of 67% and no ischemia/infarct. She was unable to walk on the treadmill due to dizziness. At her last visit with Dr. Johney Frame in Oct 2014, with hall walk in the office, her heart rate increased from 63 to 90 with activity with no change in oxygen saturation. She has since had a sleep study which revealed mild OSA, not requiring CPAP.  Today, she reports her palpitations have improved. She continues to have fatigue / lack of energy which has been ongoing x 8 months but is unchanged. She feels it is related to increased stressors due to finances, daughter's illess (Crohn's disease with recent hospitalization) and death of a close friend. She has no other complaints today. She denies chest pain or shortness of breath. She denies palpitations, dizziness, near syncope or syncope. She denies LE swelling, orthopnea, PND or recent weight gain. She is compliant and tolerating medications without difficulty.  Past Medical History Past Medical History  Diagnosis Date  . Diabetes mellitus, type 2     diet controlled  . Hypertension     medicine refractory  . Osteopenia   . Hyperlipidemia   . Cancer     breast CA s/p Right mastectomy  . Hypothyroidism     s/p thryroid gland ablation  . Neuromuscular disorder   . Atrial fibrillation 04/13/2011    on Xarelto for stroke prevention  . Anemia 12/18/2011    Past Surgical History Past Surgical History  Procedure Laterality Date  . Breast enhancement surgery    .  Breast implant removal  2000    Unilaterally   . Cholecystectomy    . Colonoscopy  2002    Neg  . Rai  04/04/2006  . Mastectomy    . Tubal ligation    . Breast surgery      Allergies/Intolerances Allergies  Allergen Reactions  . Lisinopril     ? Angioedema (NOT ARB candidate) Because of a history of documented adverse serious drug reaction;Medi Alert bracelet  is recommended  . Hctz [Hydrochlorothiazide]     hyponatremia  . Spironolactone     REACTION: low potassium ???  . Penicillins Other (See Comments)    Pt states medication doesn't work for her d/t her usage of it several years ago.  . Amlodipine Besylate     REACTION: edema    Current Home Medications Current Outpatient Prescriptions  Medication Sig Dispense Refill  . AMBULATORY NON FORMULARY MEDICATION Breast Prothesis,  DX: Mastectomy  2 each  0  . gabapentin (NEURONTIN) 300 MG capsule Take 300 mg by mouth at bedtime.      . hydrALAZINE (APRESOLINE) 50 MG tablet Take 100 mg by mouth 2 (two) times daily.      . isosorbide mononitrate (IMDUR) 30 MG 24 hr tablet Take 30 mg by mouth daily.      Marland Kitchen levothyroxine (SYNTHROID, LEVOTHROID) 112 MCG tablet TAKE 1 TABLET DAILY EXCEPT 1/2 TABLET ON TUESDAYS, THURSDAYS, AND SATURDAYS  90 tablet  1  . metoprolol tartrate (LOPRESSOR) 12.5 mg TABS tablet Take 0.5 tablets (12.5 mg total) by  mouth 2 (two) times daily.      . Rivaroxaban (XARELTO) 20 MG TABS Take 1 tablet (20 mg total) by mouth daily with supper.  30 tablet  11  . simvastatin (ZOCOR) 20 MG tablet Take 20 mg by mouth at bedtime.      Marland Kitchen spironolactone (ALDACTONE) 50 MG tablet Take 50 mg by mouth daily.       No current facility-administered medications for this visit.    Social History History   Social History  . Marital Status: Divorced    Spouse Name: N/A    Number of Children: N/A  . Years of Education: N/A   Occupational History  . Not on file.   Social History Main Topics  . Smoking status: Never Smoker     . Smokeless tobacco: Never Used  . Alcohol Use: No  . Drug Use: No  . Sexual Activity: No   Other Topics Concern  . Not on file   Social History Narrative  . No narrative on file     Review of Systems General: No chills, fever, night sweats or weight changes Cardiovascular: No chest pain, dyspnea on exertion, edema, orthopnea, palpitations, paroxysmal nocturnal dyspnea Dermatological: No rash, lesions or masses Respiratory: No cough, dyspnea Urologic: No hematuria, dysuria Abdominal: No nausea, vomiting, diarrhea, bright red blood per rectum, melena, or hematemesis Neurologic: No visual changes, weakness, changes in mental status All other systems reviewed and are otherwise negative except as noted above.  Physical Exam Vitals: Blood pressure 142/68, pulse 60, height 5\' 7"  (1.702 m), weight 215 lb 1.9 oz (97.578 kg), SpO2 98.00%.  General: Well developed, well appearing 72 y.o. female in no acute distress. HEENT: Normocephalic, atraumatic. EOMs intact. Sclera nonicteric. Oropharynx clear.  Neck: Supple without bruits. No JVD. Lungs: Respirations regular and unlabored, CTA bilaterally. No wheezes, rales or rhonchi. Heart: RRR. S1, S2 present. No murmurs, rub, S3 or S4. Abdomen: Soft, non-tender, non-distended. BS present x 4 quadrants. No hepatosplenomegaly.  Extremities: No clubbing, cyanosis or edema. DP/PT/Radials 2+ and equal bilaterally. Psych: Normal affect. Neuro: Alert and oriented X 3. Moves all extremities spontaneously.   Diagnostics LexiScan Myoview Oct 2014 Impression  Exercise Capacity: Lexiscan with no exercise.  BP Response: Normal blood pressure response.  Clinical Symptoms: No significant symptoms noted.  ECG Impression: No significant ST segment change suggestive of ischemia.  Comparison with Prior Nuclear Study: No previous nuclear study performed  Overall Impression: Normal stress nuclear study.  LV Ejection Fraction: 67%. LV Wall Motion: NL LV  Function; NL Wall Motion Sleep Study - per report by Dr. Shelle Iron Impression / Recommendations 1. Mild obstructive sleep apnea/hypopnea syndrome, with an AHI of 12  events per hour and oxygen desaturation as low as 87%. Treatment  for this degree of sleep apnea can include a trial of weight loss  alone, upper airway surgery, dental appliance, and also CPAP.  Clinical correlation is suggested  2. Premature atrial contraction noted at times during the night, but  no clinically significant arrhythmias were seen.  12-lead ECG today - sinus bradycardia with first degree AV block at 53 bpm; unchanged from previous ECGs  Assessment and Plan 1. Sinus bradycardia  - asymptomatic - has not worsened since restarting metoprolol  - return to clinic for follow-up with Dr. Johney Frame in 3 months 2. PAF  - stable; palpitations improved on metoprolol  - continue rate control with metoprolol and Xarelto for stroke risk reduction  She was also advised to follow-up with Dr. Alwyn Ren  regarding stress management and fatigue. Ms. Velasquez expressed verbal understanding and agrees with plan of care. Signed, Rick Duff, PA-C 05/04/2013, 12:04 PM

## 2013-05-07 ENCOUNTER — Ambulatory Visit (INDEPENDENT_AMBULATORY_CARE_PROVIDER_SITE_OTHER): Payer: Medicare Other | Admitting: Internal Medicine

## 2013-05-07 ENCOUNTER — Encounter: Payer: Self-pay | Admitting: Internal Medicine

## 2013-05-07 VITALS — BP 203/69 | HR 58 | Temp 98.5°F | Ht 65.75 in | Wt 215.2 lb

## 2013-05-07 DIAGNOSIS — I10 Essential (primary) hypertension: Secondary | ICD-10-CM | POA: Diagnosis not present

## 2013-05-07 DIAGNOSIS — F4323 Adjustment disorder with mixed anxiety and depressed mood: Secondary | ICD-10-CM | POA: Diagnosis not present

## 2013-05-07 DIAGNOSIS — B070 Plantar wart: Secondary | ICD-10-CM | POA: Diagnosis not present

## 2013-05-07 MED ORDER — CITALOPRAM HYDROBROMIDE 20 MG PO TABS: 20.0000 mg | ORAL_TABLET | Freq: Every day | ORAL | Status: DC

## 2013-05-07 MED ORDER — CLONAZEPAM 0.5 MG PO TABS: ORAL_TABLET | ORAL | Status: DC

## 2013-05-07 NOTE — Progress Notes (Signed)
Pre visit review using our clinic review tool, if applicable. No additional management support is needed unless otherwise documented below in the visit note. 

## 2013-05-07 NOTE — Patient Instructions (Signed)
Your next office appointment will be determined based upon response to medications. Please report any significant change in your symptoms. Minimal Blood Pressure Goal= AVERAGE < 140/90;  Ideal is an AVERAGE < 135/85. This AVERAGE should be calculated from @ least 5-7 BP readings taken @ different times of day on different days of week. You should not respond to isolated BP readings , but rather the AVERAGE for that week .Please bring your  blood pressure cuff to office visits to verify that it is reliable.It  can also be checked against the blood pressure device at the pharmacy. Finger or wrist cuffs are not dependable; an arm cuff is.
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
## 2013-05-24 ENCOUNTER — Other Ambulatory Visit: Payer: Self-pay | Admitting: Internal Medicine

## 2013-05-24 NOTE — Telephone Encounter (Signed)
Gabapentin refilled per protocol. JG//CMA 

## 2013-05-31 ENCOUNTER — Other Ambulatory Visit: Payer: Self-pay | Admitting: *Deleted

## 2013-05-31 MED ORDER — METOPROLOL TARTRATE 12.5 MG HALF TABLET: 12.5000 mg | ORAL_TABLET | Freq: Two times a day (BID) | ORAL | Status: DC

## 2013-05-31 MED ORDER — ISOSORBIDE MONONITRATE ER 30 MG PO TB24: 30.0000 mg | ORAL_TABLET | Freq: Every day | ORAL | Status: DC

## 2013-07-09 ENCOUNTER — Other Ambulatory Visit: Payer: Self-pay | Admitting: Internal Medicine

## 2013-07-09 ENCOUNTER — Other Ambulatory Visit: Payer: Self-pay | Admitting: *Deleted

## 2013-07-09 MED ORDER — RIVAROXABAN 20 MG PO TABS: 20.0000 mg | ORAL_TABLET | Freq: Every day | ORAL | Status: DC

## 2013-07-21 DIAGNOSIS — M216X9 Other acquired deformities of unspecified foot: Secondary | ICD-10-CM | POA: Diagnosis not present

## 2013-07-21 DIAGNOSIS — E119 Type 2 diabetes mellitus without complications: Secondary | ICD-10-CM | POA: Diagnosis not present

## 2013-07-21 DIAGNOSIS — D2370 Other benign neoplasm of skin of unspecified lower limb, including hip: Secondary | ICD-10-CM | POA: Diagnosis not present

## 2013-07-28 DIAGNOSIS — H25019 Cortical age-related cataract, unspecified eye: Secondary | ICD-10-CM | POA: Diagnosis not present

## 2013-07-28 DIAGNOSIS — H5200 Hypermetropia, unspecified eye: Secondary | ICD-10-CM | POA: Diagnosis not present

## 2013-07-28 DIAGNOSIS — E1139 Type 2 diabetes mellitus with other diabetic ophthalmic complication: Secondary | ICD-10-CM | POA: Diagnosis not present

## 2013-07-28 DIAGNOSIS — H2510 Age-related nuclear cataract, unspecified eye: Secondary | ICD-10-CM | POA: Diagnosis not present

## 2013-07-28 DIAGNOSIS — H25049 Posterior subcapsular polar age-related cataract, unspecified eye: Secondary | ICD-10-CM | POA: Diagnosis not present

## 2013-07-28 DIAGNOSIS — E11319 Type 2 diabetes mellitus with unspecified diabetic retinopathy without macular edema: Secondary | ICD-10-CM | POA: Diagnosis not present

## 2013-07-28 DIAGNOSIS — H524 Presbyopia: Secondary | ICD-10-CM | POA: Diagnosis not present

## 2013-07-28 DIAGNOSIS — E1165 Type 2 diabetes mellitus with hyperglycemia: Secondary | ICD-10-CM | POA: Diagnosis not present

## 2013-07-31 ENCOUNTER — Encounter: Payer: Self-pay | Admitting: Internal Medicine

## 2013-08-05 DIAGNOSIS — D2370 Other benign neoplasm of skin of unspecified lower limb, including hip: Secondary | ICD-10-CM | POA: Diagnosis not present

## 2013-08-10 ENCOUNTER — Encounter: Payer: Self-pay | Admitting: Internal Medicine

## 2013-08-11 ENCOUNTER — Ambulatory Visit: Payer: Medicare Other | Admitting: Internal Medicine

## 2013-08-18 ENCOUNTER — Encounter: Payer: Self-pay | Admitting: Internal Medicine

## 2013-08-18 ENCOUNTER — Ambulatory Visit (INDEPENDENT_AMBULATORY_CARE_PROVIDER_SITE_OTHER): Payer: Medicare Other | Admitting: Internal Medicine

## 2013-08-18 VITALS — BP 154/65 | HR 60 | Ht 67.0 in | Wt 216.2 lb

## 2013-08-18 DIAGNOSIS — I4891 Unspecified atrial fibrillation: Secondary | ICD-10-CM

## 2013-08-18 DIAGNOSIS — I10 Essential (primary) hypertension: Secondary | ICD-10-CM | POA: Diagnosis not present

## 2013-08-18 LAB — BASIC METABOLIC PANEL
BUN: 20 mg/dL (ref 6–23)
CO2: 26 mEq/L (ref 19–32)
Calcium: 9.3 mg/dL (ref 8.4–10.5)
Chloride: 100 mEq/L (ref 96–112)
Creatinine, Ser: 1.3 mg/dL — ABNORMAL HIGH (ref 0.4–1.2)
GFR: 44.21 mL/min — AB (ref >60.00)
Glucose, Bld: 171 mg/dL — ABNORMAL HIGH (ref 70–99)
Potassium: 3.9 mEq/L (ref 3.5–5.1)
Sodium: 136 mEq/L (ref 135–145)

## 2013-08-18 NOTE — Patient Instructions (Addendum)
Your physician wants you to follow-up in: 6 months with Ileene Hutchinson, PA and 12 months with Dr Vallery Ridge will receive a reminder letter in the mail two months in advance. If you don't receive a letter, please call our office to schedule the follow-up appointment.   Your physician recommends that you return for lab work today

## 2013-08-18 NOTE — Progress Notes (Signed)
PCP:  Gloria Cobble, MD  The patient presents today for routine electrophysiology followup.  Since last being seen in our clinic, the patient has done well.   Today, she denies symptoms of palpitations, chest pain, shortness of breath, orthopnea, PND, lower extremity edema, dizziness,  or neurologic sequela.  The patient feels that she is tolerating medications without difficulties and is otherwise without complaint today.   Past Medical History  Diagnosis Date  . Diabetes mellitus, type 2     diet controlled  . Hypertension     medicine refractory  . Osteopenia   . Hyperlipidemia   . Cancer     breast CA s/p Right mastectomy  . Hypothyroidism     s/p thryroid gland ablation  . Neuromuscular disorder   . Atrial fibrillation 04/13/2011    on Xarelto for stroke prevention  . Anemia 12/18/2011   Past Surgical History  Procedure Laterality Date  . Breast enhancement surgery    . Breast implant removal  2000    Unilaterally   . Cholecystectomy    . Colonoscopy  2002    Neg  . Rai  04/04/2006  . Mastectomy    . Tubal ligation    . Breast surgery      Current Outpatient Prescriptions  Medication Sig Dispense Refill  . citalopram (CELEXA) 20 MG tablet Take 1 tablet (20 mg total) by mouth daily.  30 tablet  3  . clonazePAM (KLONOPIN) 0.5 MG tablet Take 1 tablet at night as needed      . gabapentin (NEURONTIN) 300 MG capsule TAKE ONE CAPSULE BY MOUTH AT BEDTIME  90 capsule  0  . hydrALAZINE (APRESOLINE) 50 MG tablet Take 100 mg by mouth 2 (two) times daily.      . isosorbide mononitrate (IMDUR) 30 MG 24 hr tablet Take 1 tablet (30 mg total) by mouth daily.  90 tablet  1  . levothyroxine (SYNTHROID, LEVOTHROID) 112 MCG tablet TAKE 1 TABLET DAILY EXCEPT 1/2 TABLET ON TUESDAYS, THURSDAYS, AND SATURDAYS  90 tablet  1  . metoprolol tartrate (LOPRESSOR) 12.5 mg TABS tablet Take 0.5 tablets (12.5 mg total) by mouth 2 (two) times daily.  45 tablet  1  . simvastatin (ZOCOR) 20 MG tablet Take  20 mg by mouth at bedtime.      Marland Kitchen spironolactone (ALDACTONE) 50 MG tablet Take 50 mg by mouth daily.      Alveda Reasons 20 MG TABS tablet TAKE 1 TABLET EVERY DAY WITH SUPPER  30 tablet  6  . DUREZOL 0.05 % EMUL Pt will start this on 08/01/13      . ketorolac (ACULAR) 0.5 % ophthalmic solution Pt will start this on 08/01/13      . ofloxacin (OCUFLOX) 0.3 % ophthalmic solution Pt will start this on 08/01/13       No current facility-administered medications for this visit.    Allergies  Allergen Reactions  . Lisinopril     ? Angioedema (NOT ARB candidate) Because of a history of documented adverse serious drug reaction;Medi Alert bracelet  is recommended  . Hctz [Hydrochlorothiazide]     hyponatremia  . Spironolactone     REACTION: low potassium ???  . Penicillins Other (See Comments)    Pt states medication doesn't work for her d/t her usage of it several years ago.  . Amlodipine Besylate     REACTION: edema    History   Social History  . Marital Status: Divorced    Spouse Name: N/A  Number of Children: N/A  . Years of Education: N/A   Occupational History  . Not on file.   Social History Main Topics  . Smoking status: Never Smoker   . Smokeless tobacco: Never Used  . Alcohol Use: No  . Drug Use: No  . Sexual Activity: No   Other Topics Concern  . Not on file   Social History Narrative  . No narrative on file    Family History  Problem Relation Age of Onset  . Depression Mother   . Alcohol abuse Father   . Heart attack Brother 87  . Cancer Other     Uncle, not specific  . Cancer Other     Aunt, not specific. GYN CA  . Breast cancer Other     Aunt, not specific  . Diabetes Other     Grandmother, not specific  . Cancer Maternal Grandfather     colon     Physical Exam: Filed Vitals:   08/18/13 1352  BP: 154/65  Pulse: 60  Height: 5\' 7"  (1.702 m)  Weight: 216 lb 3.2 oz (98.068 kg)    GEN- The patient is well appearing, alert and oriented x 3 today.    Head- normocephalic, atraumatic Eyes-  Sclera clear, conjunctiva pink Ears- hearing intact Oropharynx- clear Neck- supple, no JVP Lymph- no cervical lymphadenopathy Lungs- Clear to ausculation bilaterally, normal work of breathing Heart- Regular rate and rhythm, no murmurs, rubs or gallops, PMI not laterally displaced GI- soft, NT, ND, + BS Extremities- no clubbing, cyanosis, or edema Neuro- strength and sensation are intact  ekg today reveals sinus rhythm 60 bpm, PR 230 msec, LVH, otherwise normal ekg  Assessment and Plan:4  1. afib Well controlled No changes Continue xarelto bmet today  2. Sinus Bradycardia- asymptomatic, resolved  3. HTN Above goal Weight reduction and sodium restriction advised  Return to see Jerene Pitch in 6 months I will see in a year

## 2013-08-19 ENCOUNTER — Telehealth: Payer: Self-pay | Admitting: Internal Medicine

## 2013-08-19 NOTE — Telephone Encounter (Signed)
Received request from Nurse fax box, documents faxed for surgical clearance. IO:[facility] Fax number: [phone] Attention: 4.2.15/kdm

## 2013-08-26 DIAGNOSIS — D2370 Other benign neoplasm of skin of unspecified lower limb, including hip: Secondary | ICD-10-CM | POA: Diagnosis not present

## 2013-08-26 DIAGNOSIS — M79609 Pain in unspecified limb: Secondary | ICD-10-CM | POA: Diagnosis not present

## 2013-08-26 DIAGNOSIS — Q6689 Other  specified congenital deformities of feet: Secondary | ICD-10-CM | POA: Diagnosis not present

## 2013-08-31 DIAGNOSIS — H2510 Age-related nuclear cataract, unspecified eye: Secondary | ICD-10-CM | POA: Diagnosis not present

## 2013-08-31 DIAGNOSIS — H269 Unspecified cataract: Secondary | ICD-10-CM | POA: Diagnosis not present

## 2013-09-15 DIAGNOSIS — M76899 Other specified enthesopathies of unspecified lower limb, excluding foot: Secondary | ICD-10-CM | POA: Diagnosis not present

## 2013-09-21 ENCOUNTER — Other Ambulatory Visit: Payer: Self-pay

## 2013-09-21 MED ORDER — HYDRALAZINE HCL 50 MG PO TABS: 100.0000 mg | ORAL_TABLET | Freq: Two times a day (BID) | ORAL | Status: DC

## 2013-09-23 ENCOUNTER — Other Ambulatory Visit (INDEPENDENT_AMBULATORY_CARE_PROVIDER_SITE_OTHER): Payer: Medicare Other

## 2013-09-23 ENCOUNTER — Ambulatory Visit (INDEPENDENT_AMBULATORY_CARE_PROVIDER_SITE_OTHER): Payer: Medicare Other | Admitting: Internal Medicine

## 2013-09-23 ENCOUNTER — Encounter: Payer: Self-pay | Admitting: Internal Medicine

## 2013-09-23 VITALS — BP 190/86 | HR 46 | Temp 98.9°F | Resp 14 | Wt 213.0 lb

## 2013-09-23 DIAGNOSIS — N289 Disorder of kidney and ureter, unspecified: Secondary | ICD-10-CM | POA: Diagnosis not present

## 2013-09-23 DIAGNOSIS — E782 Mixed hyperlipidemia: Secondary | ICD-10-CM

## 2013-09-23 DIAGNOSIS — E039 Hypothyroidism, unspecified: Secondary | ICD-10-CM

## 2013-09-23 DIAGNOSIS — E1129 Type 2 diabetes mellitus with other diabetic kidney complication: Secondary | ICD-10-CM | POA: Diagnosis not present

## 2013-09-23 DIAGNOSIS — D2370 Other benign neoplasm of skin of unspecified lower limb, including hip: Secondary | ICD-10-CM | POA: Diagnosis not present

## 2013-09-23 DIAGNOSIS — Z9181 History of falling: Secondary | ICD-10-CM | POA: Diagnosis not present

## 2013-09-23 DIAGNOSIS — I10 Essential (primary) hypertension: Secondary | ICD-10-CM

## 2013-09-23 DIAGNOSIS — Q6689 Other  specified congenital deformities of feet: Secondary | ICD-10-CM | POA: Diagnosis not present

## 2013-09-23 DIAGNOSIS — R296 Repeated falls: Secondary | ICD-10-CM

## 2013-09-23 DIAGNOSIS — G9009 Other idiopathic peripheral autonomic neuropathy: Secondary | ICD-10-CM | POA: Diagnosis not present

## 2013-09-23 HISTORY — DX: Disorder of kidney and ureter, unspecified: N28.9

## 2013-09-23 LAB — BASIC METABOLIC PANEL
BUN: 19 mg/dL (ref 6–23)
CALCIUM: 9.6 mg/dL (ref 8.4–10.5)
CO2: 27 mEq/L (ref 19–32)
CREATININE: 1.2 mg/dL (ref 0.4–1.2)
Chloride: 100 mEq/L (ref 96–112)
GFR: 48.62 mL/min — AB (ref >60.00)
Glucose, Bld: 103 mg/dL — ABNORMAL HIGH (ref 70–99)
Potassium: 4.4 mEq/L (ref 3.5–5.1)
SODIUM: 134 meq/L — AB (ref 135–145)

## 2013-09-23 LAB — LIPID PANEL
CHOLESTEROL: 218 mg/dL — AB (ref 0–200)
HDL: 63 mg/dL (ref >39.00)
LDL CALC: 132 mg/dL — AB (ref 0–99)
Total CHOL/HDL Ratio: 3
Triglycerides: 116 mg/dL (ref 0.0–149.0)
VLDL: 23.2 mg/dL (ref 0.0–40.0)

## 2013-09-23 LAB — MICROALBUMIN / CREATININE URINE RATIO
Creatinine,U: 165.1 mg/dL
MICROALB UR: 5.8 mg/dL — AB (ref 0.0–1.9)
Microalb Creat Ratio: 3.5 mg/g (ref 0.0–30.0)

## 2013-09-23 LAB — HEPATIC FUNCTION PANEL
ALT: 17 U/L (ref 0–35)
AST: 22 U/L (ref 0–37)
Albumin: 4.1 g/dL (ref 3.5–5.2)
Alkaline Phosphatase: 63 U/L (ref 39–117)
BILIRUBIN DIRECT: 0.2 mg/dL (ref 0.0–0.3)
BILIRUBIN TOTAL: 1.1 mg/dL (ref 0.2–1.2)
Total Protein: 7.6 g/dL (ref 6.0–8.3)

## 2013-09-23 LAB — HEMOGLOBIN A1C: Hgb A1c MFr Bld: 6.7 % — ABNORMAL HIGH (ref 4.6–6.5)

## 2013-09-23 LAB — TSH: TSH: 2.7 u[IU]/mL (ref 0.35–4.50)

## 2013-09-23 MED ORDER — HYDRALAZINE HCL 100 MG PO TABS: ORAL_TABLET | ORAL | Status: DC

## 2013-09-23 NOTE — Assessment & Plan Note (Signed)
A1c & urine microalbumin  

## 2013-09-23 NOTE — Patient Instructions (Signed)
The PT referral will be scheduled and you'll be notified of the time. Your next office appointment will be determined based upon review of your pending labs . Those instructions will be transmitted to you  by mail. Minimal Blood Pressure Goal= AVERAGE < 140/90;  Ideal is an AVERAGE < 135/85. This AVERAGE should be calculated from @ least 5-7 BP readings taken @ different times of day on different days of week. You should not respond to isolated BP readings , but rather the AVERAGE for that week .Please bring your  blood pressure cuff to office visits to verify that it is reliable.It  can also be checked against the blood pressure device at the pharmacy. Finger or wrist cuffs are not dependable; an arm cuff is.

## 2013-09-23 NOTE — Assessment & Plan Note (Signed)
TSH 

## 2013-09-23 NOTE — Assessment & Plan Note (Addendum)
Blood pressure goals reviewed. BMET  Hydralazine will be changed to 100 mg 1.5 pills twice a day

## 2013-09-23 NOTE — Progress Notes (Signed)
Pre visit review using our clinic review tool, if applicable. No additional management support is needed unless otherwise documented below in the visit note. 

## 2013-09-23 NOTE — Progress Notes (Signed)
Subjective:    Patient ID: Gloria Joseph, female    DOB: November 10, 1940, 73 y.o.   MRN: [id number]  HPI HYPERTENSION: Disease Monitoring: Blood pressure range/ average : Blood pressure average is 150/80. Dr. Rayann Heman, her cardiologist has decreased her cardiac meds because of profound bradycardia with pulses less than 50. Medication Compliance:yes  Diabetes :  FBS range/average:not monitored Highest 2 hr post meal glucose:not monitored Medication compliance:no med Hypoglycemia:no Ophthamology care:UTD; status post cataract surgery of the left Podiatry care: Current  HYPERLIPIDEMIA: Disease Monitoring: Medication Compliance: Yes   Review of Systems  Chest pain, palpitations: no     Dyspnea: No Edema: No Claudication: No Lightheadedness,Syncope:no just "trips". She's had numerous falls. There is no associated cardiac or neurologic prodrome to the falls. She's had no recurrent syncope as she did when she was having paroxysmal atrial fibrillation. She states that she will actually fall forward, tripping even on level ground Weight gain/loss: No significant change Polyuria/phagia/dipsia:  No    Blurred vision /diplopia/lossof vision: No; cataract surgery planned on right Non healing skin lesions:no but diffuse erythematous lesions since 7/14. These lesions are stable. They're not pruritic or painful Abd pain, bowel changes: no but post prandial nausea Myalgias: No Memory loss: no        Objective:   Physical Exam Gen.: Weight excess; adequately nourished in appearance. Alert, appropriate and cooperative throughout exam. Appears younger than stated age  Head: Normocephalic without obvious abnormalities  Eyes: No corneal or conjunctival inflammation noted. There is asymmetry of her eyes with slight ptosis on the left compared to the right. These changes have been present since the cataract surgery  Ears: External  ear exam reveals no significant lesions or deformities. Hearing  is grossly normal bilaterally. Nose: External nasal exam reveals no deformity or inflammation. Nasal mucosa are pink and moist. No lesions or exudates noted.   Mouth: Oral mucosa and oropharynx reveal no lesions or exudates. Teeth in good repair. Neck: No deformities, masses, or tenderness noted. Range of motion and Thyroid normal Lungs: Normal respiratory effort; chest expands symmetrically. Lungs are clear to auscultation without rales, wheezes, or increased work of breathing. Heart: Slow and irregular rhythm. Flow murmur. Blood pressure recheck 180/78 Abdomen: Bowel sounds normal; abdomen soft and nontender. No masses, organomegaly or hernias noted.                              Musculoskeletal/extremities: Accentuated curvature of upper thoracic spine. No clubbing, cyanosis, edema, or significant extremity  deformity noted. Range of motion normal .Tone & strength normal. Hand joints normal  Fingernail  health good. Able to lie down & sit up w/o help.  Vascular: Carotid, radial artery, dorsalis pedis and  posterior tibial pulses are full and equal. No bruits present. Neurologic: Alert and oriented x3. Deep tendon reflexes symmetrical and 1/2+.  Gait choppy and deliberate   Skin: Faint small scattered lesions which appear bland in appearance. These do not blanch. They are localized mainly on the extremities.. Lymph: No cervical, axillary lymphadenopathy present. Psych: Mood and affect are normal. Normally interactive. Very jovial  Assessment & Plan:  See Current Assessment & Plan in Problem List under specific Diagnosis

## 2013-09-23 NOTE — Assessment & Plan Note (Signed)
Physical therapy referral 

## 2013-09-23 NOTE — Assessment & Plan Note (Signed)
Lipids, LFTs 

## 2013-09-26 ENCOUNTER — Other Ambulatory Visit: Payer: Self-pay | Admitting: Internal Medicine

## 2013-09-26 DIAGNOSIS — E785 Hyperlipidemia, unspecified: Secondary | ICD-10-CM

## 2013-09-26 DIAGNOSIS — E1129 Type 2 diabetes mellitus with other diabetic kidney complication: Secondary | ICD-10-CM

## 2013-09-26 MED ORDER — ATORVASTATIN CALCIUM 20 MG PO TABS: 20.0000 mg | ORAL_TABLET | Freq: Every day | ORAL | Status: DC

## 2013-09-30 DIAGNOSIS — H2510 Age-related nuclear cataract, unspecified eye: Secondary | ICD-10-CM | POA: Diagnosis not present

## 2013-09-30 DIAGNOSIS — H25019 Cortical age-related cataract, unspecified eye: Secondary | ICD-10-CM | POA: Diagnosis not present

## 2013-09-30 DIAGNOSIS — H25049 Posterior subcapsular polar age-related cataract, unspecified eye: Secondary | ICD-10-CM | POA: Diagnosis not present

## 2013-10-05 DIAGNOSIS — H2510 Age-related nuclear cataract, unspecified eye: Secondary | ICD-10-CM | POA: Diagnosis not present

## 2013-10-05 DIAGNOSIS — H269 Unspecified cataract: Secondary | ICD-10-CM | POA: Diagnosis not present

## 2013-10-06 ENCOUNTER — Other Ambulatory Visit: Payer: Self-pay

## 2013-10-06 ENCOUNTER — Telehealth: Payer: Self-pay | Admitting: *Deleted

## 2013-10-06 MED ORDER — SPIRONOLACTONE 50 MG PO TABS: 50.0000 mg | ORAL_TABLET | Freq: Every day | ORAL | Status: DC

## 2013-10-06 MED ORDER — ISOSORBIDE MONONITRATE ER 30 MG PO TB24: 30.0000 mg | ORAL_TABLET | Freq: Every day | ORAL | Status: DC

## 2013-10-06 MED ORDER — GABAPENTIN 300 MG PO CAPS: ORAL_CAPSULE | ORAL | Status: DC

## 2013-10-06 NOTE — Telephone Encounter (Signed)
Pt states she received lab letter in mail. MD is wanting to change her to generic lipitor for her cholesterol. Pt states she does not want to take lipitor. She has heard bad thing/side effect with lipitor. She states her BP has been running anywhere from 135-140/65 average. Wanting alternative for liptor...Johny Chess

## 2013-10-06 NOTE — Telephone Encounter (Signed)
   Change Lipitor to simvastatin 20 mg daily #90.  Repeat fasting lipids, hepatic panel, CK after 10 weeks.

## 2013-10-07 ENCOUNTER — Other Ambulatory Visit: Payer: Self-pay | Admitting: *Deleted

## 2013-10-07 MED ORDER — SIMVASTATIN 20 MG PO TABS: 20.0000 mg | ORAL_TABLET | Freq: Every day | ORAL | Status: DC

## 2013-10-07 NOTE — Telephone Encounter (Signed)
Spoke with pt advised of MDs message 

## 2013-10-12 ENCOUNTER — Other Ambulatory Visit: Payer: Self-pay

## 2013-10-12 MED ORDER — METOPROLOL TARTRATE 12.5 MG HALF TABLET: 12.5000 mg | ORAL_TABLET | Freq: Two times a day (BID) | ORAL | Status: DC

## 2013-11-11 DIAGNOSIS — M25579 Pain in unspecified ankle and joints of unspecified foot: Secondary | ICD-10-CM | POA: Diagnosis not present

## 2013-11-11 DIAGNOSIS — M76899 Other specified enthesopathies of unspecified lower limb, excluding foot: Secondary | ICD-10-CM | POA: Diagnosis not present

## 2013-12-07 ENCOUNTER — Telehealth: Payer: Self-pay | Admitting: *Deleted

## 2013-12-07 NOTE — Telephone Encounter (Signed)
Pt called wanting samples for Xarelto.

## 2013-12-08 ENCOUNTER — Telehealth: Payer: Self-pay

## 2013-12-08 ENCOUNTER — Other Ambulatory Visit (INDEPENDENT_AMBULATORY_CARE_PROVIDER_SITE_OTHER): Payer: Medicare Other

## 2013-12-08 ENCOUNTER — Encounter: Payer: Self-pay | Admitting: Internal Medicine

## 2013-12-08 ENCOUNTER — Ambulatory Visit (INDEPENDENT_AMBULATORY_CARE_PROVIDER_SITE_OTHER): Payer: Medicare Other | Admitting: Internal Medicine

## 2013-12-08 VITALS — BP 120/80 | HR 80 | Temp 98.3°F | Wt 219.4 lb

## 2013-12-08 DIAGNOSIS — E669 Obesity, unspecified: Secondary | ICD-10-CM | POA: Insufficient documentation

## 2013-12-08 DIAGNOSIS — M949 Disorder of cartilage, unspecified: Secondary | ICD-10-CM

## 2013-12-08 DIAGNOSIS — IMO0001 Reserved for inherently not codable concepts without codable children: Secondary | ICD-10-CM

## 2013-12-08 DIAGNOSIS — E559 Vitamin D deficiency, unspecified: Secondary | ICD-10-CM | POA: Diagnosis not present

## 2013-12-08 DIAGNOSIS — M899 Disorder of bone, unspecified: Secondary | ICD-10-CM

## 2013-12-08 DIAGNOSIS — I10 Essential (primary) hypertension: Secondary | ICD-10-CM

## 2013-12-08 DIAGNOSIS — R7309 Other abnormal glucose: Secondary | ICD-10-CM

## 2013-12-08 DIAGNOSIS — F4323 Adjustment disorder with mixed anxiety and depressed mood: Secondary | ICD-10-CM

## 2013-12-08 DIAGNOSIS — L608 Other nail disorders: Secondary | ICD-10-CM | POA: Diagnosis not present

## 2013-12-08 DIAGNOSIS — M79609 Pain in unspecified limb: Secondary | ICD-10-CM | POA: Diagnosis not present

## 2013-12-08 DIAGNOSIS — E1149 Type 2 diabetes mellitus with other diabetic neurological complication: Secondary | ICD-10-CM | POA: Diagnosis not present

## 2013-12-08 DIAGNOSIS — L84 Corns and callosities: Secondary | ICD-10-CM | POA: Diagnosis not present

## 2013-12-08 LAB — BASIC METABOLIC PANEL
BUN: 17 mg/dL (ref 6–23)
CHLORIDE: 103 meq/L (ref 96–112)
CO2: 28 meq/L (ref 19–32)
Calcium: 9.8 mg/dL (ref 8.4–10.5)
Creatinine, Ser: 1.1 mg/dL (ref 0.4–1.2)
GFR: 50.08 mL/min — ABNORMAL LOW (ref >60.00)
Glucose, Bld: 151 mg/dL — ABNORMAL HIGH (ref 70–99)
POTASSIUM: 4.5 meq/L (ref 3.5–5.1)
Sodium: 138 mEq/L (ref 135–145)

## 2013-12-08 LAB — MAGNESIUM: Magnesium: 1.8 mg/dL (ref 1.5–2.5)

## 2013-12-08 LAB — HEMOGLOBIN A1C: Hgb A1c MFr Bld: 6.6 % — ABNORMAL HIGH (ref 4.6–6.5)

## 2013-12-08 LAB — CK: Total CK: 102 U/L (ref 7–177)

## 2013-12-08 MED ORDER — CITALOPRAM HYDROBROMIDE 20 MG PO TABS: 20.0000 mg | ORAL_TABLET | Freq: Every day | ORAL | Status: DC

## 2013-12-08 NOTE — Telephone Encounter (Signed)
Diabetic bundle  Pt is being seen by MD today: Will recheck BP  Too early to recheck LDL- last done on 09-23-13 along with A1C

## 2013-12-08 NOTE — Progress Notes (Signed)
Pre visit review using our clinic review tool, if applicable. No additional management support is needed unless otherwise documented below in the visit note. 

## 2013-12-08 NOTE — Patient Instructions (Signed)
Please review Dr Nunzio Cory book Eat, [place] for best  dietary cholesterol information.  Cardiovascular exercise, this can be as simple a program as walking, is recommended 30-45 minutes 3-4 times per week. If you're not exercising you should take 6-8 weeks to build up to this level.  Please  schedule fasting Labs : NMR Lipoprofile Lipid Panel after 4 months of dietary changes to optimally assess LDL risk.  Your next office appointment will be determined based upon review of your pending labs . Those instructions will be transmitted to you through My Chart  .

## 2013-12-08 NOTE — Progress Notes (Signed)
   Subjective:    Patient ID: Gloria Joseph, female    DOB: [birth year], 73 y.o.   MRN: [id number]  HPI  She is here with multiple complaints.  She requests a new prescription for citalopram. She feels this calms her and helps control her blood pressure. The main benefit as it "helps control my anger level".  She is not monitoring her glucoses at home.  She has been compliant with her blood pressure medicines. BP average 148/80.   Review of Systems  She describes severe cramping in the entire legs bilaterally extending from the thighs to the feet.She stopped her simvastatin 7/15 and began to take tonic water with quinine. This resulted in some improvement.  She also has severe pain in her hips particularly on the left. She has great difficulty standing up and mobilizing. She's had 2 steroid injections from her orthopedist the last 60 days. She was unaware this might raise glucoses.   Additionally she's requesting a prescription for a breast prosthesis.She had an implant which had to be removed because of leakage.       Objective:   Physical Exam  Significant or distinguishing  findings on physical exam include: There is obesity as per CDC criteria. Abdomen is protuberant. She has some pain in the left hip area attempting to stand & with elevation of left lower extremity but no + SLR signs Decreased sensation over soles @ bases of toes.     Eyes: No conjunctival inflammation or scleral icterus is present. Oral exam: Dental hygiene is good; lips and gums are healthy appearing.There is no oropharyngeal erythema or exudate noted.  Heart:  Normal rate and regular rhythm. S1 and S2 normal without gallop, murmur, click, rub or other extra sounds   Lungs:Chest clear to auscultation; no wheezes, rhonchi,rales ,or rubs present.No increased work of breathing.  Abdomen: bowel sounds normal, soft and non-tender without masses, organomegaly or hernias noted.  No guarding or rebound . No  tenderness over the flanks to percussion Musculoskeletal: Able to lie flat and sit up with some help.  Skin:Warm & dry.  Intact without suspicious lesions or rashes ; no jaundice or tenting Lymphatic: No lymphadenopathy is noted about the head, neck, axilla  Note: after multiple issues addressed ; she expressed concern that other issues had yet to be addressed & that " you are in a hurry". Her last concern was who was to be her new MD when I retired        Assessment & Plan:  #1 myalgias #2 hypertension, adequate control #3 mood swings, controlled by citalopram #4 history of osteopenia #5 DM , ? Control with recent steroid injections #6 probable DJD L hip; Rx deferred to Dr Gladstone Lighter Plan: See orders and recommendations

## 2013-12-12 LAB — VITAMIN D 1,25 DIHYDROXY
VITAMIN D 1, 25 (OH) TOTAL: 40 pg/mL (ref 18–72)
VITAMIN D3 1, 25 (OH): 40 pg/mL
Vitamin D2 1, 25 (OH)2: <8 pg/mL

## 2014-01-04 ENCOUNTER — Other Ambulatory Visit: Payer: Self-pay | Admitting: Internal Medicine

## 2014-01-11 DIAGNOSIS — M999 Biomechanical lesion, unspecified: Secondary | ICD-10-CM | POA: Diagnosis not present

## 2014-01-11 DIAGNOSIS — IMO0002 Reserved for concepts with insufficient information to code with codable children: Secondary | ICD-10-CM | POA: Diagnosis not present

## 2014-01-13 DIAGNOSIS — M999 Biomechanical lesion, unspecified: Secondary | ICD-10-CM | POA: Diagnosis not present

## 2014-01-13 DIAGNOSIS — IMO0002 Reserved for concepts with insufficient information to code with codable children: Secondary | ICD-10-CM | POA: Diagnosis not present

## 2014-01-17 DIAGNOSIS — IMO0002 Reserved for concepts with insufficient information to code with codable children: Secondary | ICD-10-CM | POA: Diagnosis not present

## 2014-01-17 DIAGNOSIS — M999 Biomechanical lesion, unspecified: Secondary | ICD-10-CM | POA: Diagnosis not present

## 2014-01-19 DIAGNOSIS — M999 Biomechanical lesion, unspecified: Secondary | ICD-10-CM | POA: Diagnosis not present

## 2014-01-19 DIAGNOSIS — E039 Hypothyroidism, unspecified: Secondary | ICD-10-CM | POA: Diagnosis not present

## 2014-01-19 DIAGNOSIS — E1149 Type 2 diabetes mellitus with other diabetic neurological complication: Secondary | ICD-10-CM | POA: Diagnosis not present

## 2014-01-19 DIAGNOSIS — I10 Essential (primary) hypertension: Secondary | ICD-10-CM | POA: Diagnosis not present

## 2014-01-19 DIAGNOSIS — I4891 Unspecified atrial fibrillation: Secondary | ICD-10-CM | POA: Diagnosis not present

## 2014-01-19 DIAGNOSIS — E785 Hyperlipidemia, unspecified: Secondary | ICD-10-CM | POA: Diagnosis not present

## 2014-01-19 DIAGNOSIS — IMO0002 Reserved for concepts with insufficient information to code with codable children: Secondary | ICD-10-CM | POA: Diagnosis not present

## 2014-01-19 DIAGNOSIS — IMO0001 Reserved for inherently not codable concepts without codable children: Secondary | ICD-10-CM | POA: Diagnosis not present

## 2014-01-20 DIAGNOSIS — IMO0002 Reserved for concepts with insufficient information to code with codable children: Secondary | ICD-10-CM | POA: Diagnosis not present

## 2014-01-20 DIAGNOSIS — M999 Biomechanical lesion, unspecified: Secondary | ICD-10-CM | POA: Diagnosis not present

## 2014-01-25 DIAGNOSIS — IMO0002 Reserved for concepts with insufficient information to code with codable children: Secondary | ICD-10-CM | POA: Diagnosis not present

## 2014-01-25 DIAGNOSIS — M999 Biomechanical lesion, unspecified: Secondary | ICD-10-CM | POA: Diagnosis not present

## 2014-01-27 DIAGNOSIS — IMO0002 Reserved for concepts with insufficient information to code with codable children: Secondary | ICD-10-CM | POA: Diagnosis not present

## 2014-01-27 DIAGNOSIS — M999 Biomechanical lesion, unspecified: Secondary | ICD-10-CM | POA: Diagnosis not present

## 2014-02-01 ENCOUNTER — Telehealth: Payer: Self-pay | Admitting: *Deleted

## 2014-02-01 DIAGNOSIS — IMO0002 Reserved for concepts with insufficient information to code with codable children: Secondary | ICD-10-CM | POA: Diagnosis not present

## 2014-02-01 DIAGNOSIS — M999 Biomechanical lesion, unspecified: Secondary | ICD-10-CM | POA: Diagnosis not present

## 2014-02-01 NOTE — Telephone Encounter (Signed)
Xarelto samples provided for patient. 

## 2014-02-04 DIAGNOSIS — M999 Biomechanical lesion, unspecified: Secondary | ICD-10-CM | POA: Diagnosis not present

## 2014-02-04 DIAGNOSIS — IMO0002 Reserved for concepts with insufficient information to code with codable children: Secondary | ICD-10-CM | POA: Diagnosis not present

## 2014-02-09 DIAGNOSIS — E039 Hypothyroidism, unspecified: Secondary | ICD-10-CM | POA: Diagnosis not present

## 2014-02-09 DIAGNOSIS — I10 Essential (primary) hypertension: Secondary | ICD-10-CM | POA: Diagnosis not present

## 2014-02-09 DIAGNOSIS — E785 Hyperlipidemia, unspecified: Secondary | ICD-10-CM | POA: Diagnosis not present

## 2014-02-09 DIAGNOSIS — E1149 Type 2 diabetes mellitus with other diabetic neurological complication: Secondary | ICD-10-CM | POA: Diagnosis not present

## 2014-02-10 DIAGNOSIS — IMO0002 Reserved for concepts with insufficient information to code with codable children: Secondary | ICD-10-CM | POA: Diagnosis not present

## 2014-02-10 DIAGNOSIS — M999 Biomechanical lesion, unspecified: Secondary | ICD-10-CM | POA: Diagnosis not present

## 2014-02-17 DIAGNOSIS — M545 Low back pain: Secondary | ICD-10-CM | POA: Diagnosis not present

## 2014-02-17 DIAGNOSIS — M9903 Segmental and somatic dysfunction of lumbar region: Secondary | ICD-10-CM | POA: Diagnosis not present

## 2014-02-18 DIAGNOSIS — I10 Essential (primary) hypertension: Secondary | ICD-10-CM | POA: Diagnosis not present

## 2014-02-18 DIAGNOSIS — E785 Hyperlipidemia, unspecified: Secondary | ICD-10-CM | POA: Diagnosis not present

## 2014-02-23 DIAGNOSIS — E780 Pure hypercholesterolemia: Secondary | ICD-10-CM | POA: Diagnosis not present

## 2014-02-23 DIAGNOSIS — M8580 Other specified disorders of bone density and structure, unspecified site: Secondary | ICD-10-CM | POA: Diagnosis not present

## 2014-02-23 DIAGNOSIS — I119 Hypertensive heart disease without heart failure: Secondary | ICD-10-CM | POA: Diagnosis not present

## 2014-02-23 DIAGNOSIS — M810 Age-related osteoporosis without current pathological fracture: Secondary | ICD-10-CM | POA: Diagnosis not present

## 2014-02-24 DIAGNOSIS — M9903 Segmental and somatic dysfunction of lumbar region: Secondary | ICD-10-CM | POA: Diagnosis not present

## 2014-02-24 DIAGNOSIS — M545 Low back pain: Secondary | ICD-10-CM | POA: Diagnosis not present

## 2014-03-02 ENCOUNTER — Telehealth: Payer: Self-pay

## 2014-03-02 NOTE — Telephone Encounter (Signed)
Patient called to get samples of xarelto placed a month supply up front

## 2014-03-03 DIAGNOSIS — M9903 Segmental and somatic dysfunction of lumbar region: Secondary | ICD-10-CM | POA: Diagnosis not present

## 2014-03-03 DIAGNOSIS — M545 Low back pain: Secondary | ICD-10-CM | POA: Diagnosis not present

## 2014-03-09 DIAGNOSIS — M9903 Segmental and somatic dysfunction of lumbar region: Secondary | ICD-10-CM | POA: Diagnosis not present

## 2014-03-09 DIAGNOSIS — M545 Low back pain: Secondary | ICD-10-CM | POA: Diagnosis not present

## 2014-03-09 DIAGNOSIS — E780 Pure hypercholesterolemia: Secondary | ICD-10-CM | POA: Diagnosis not present

## 2014-03-09 DIAGNOSIS — I119 Hypertensive heart disease without heart failure: Secondary | ICD-10-CM | POA: Diagnosis not present

## 2014-03-15 ENCOUNTER — Telehealth: Payer: Self-pay | Admitting: Internal Medicine

## 2014-03-15 NOTE — Telephone Encounter (Signed)
New Message  Pt called says her legs are swelling really bad and she doesnt know why//Requests a call back to discuss

## 2014-03-16 NOTE — Telephone Encounter (Signed)
Left message for the patient to return my call today.

## 2014-03-16 NOTE — Telephone Encounter (Signed)
Spoke with patient and her PCP has been doing a lot of changing around with her medications.  He has started Losartan 160mg  for BP and Pravastatin ( not sure of the dose), also gave her a 30 day supply of a  fluid pill.  Her heart rates have been stable and she is not having any other problems other than lower leg edema.  I have asked her to contact her PCP to follow up

## 2014-03-17 DIAGNOSIS — M9903 Segmental and somatic dysfunction of lumbar region: Secondary | ICD-10-CM | POA: Diagnosis not present

## 2014-03-17 DIAGNOSIS — M545 Low back pain: Secondary | ICD-10-CM | POA: Diagnosis not present

## 2014-03-25 ENCOUNTER — Telehealth: Payer: Self-pay | Admitting: Internal Medicine

## 2014-03-25 NOTE — Telephone Encounter (Signed)
She went to her PCP a few weeks ago for her leg swelling and saw Dr Minna Antis.  He gave her a BP pill to take daily but she doesn't know the name of it and is on the way to [facility] to pick up her grandchild now.  She does have a follow up on Monday with her PCP and I have asked her to tell them her symptoms of the SOB that she says have been going on for months

## 2014-03-25 NOTE — Telephone Encounter (Signed)
New message  Pt called having a little bit of trouble with Breathing.. Not having problems currently but it is happening more than often spontaneously. Also having problems with feet and legs swelling//sr (made appt with PA Nov 30 at 9:50 am)

## 2014-03-25 NOTE — Telephone Encounter (Signed)
Follow up ° ° ° ° ° °Returning Kelly's call °

## 2014-03-25 NOTE — Telephone Encounter (Signed)
Left message for patient for patient to return my call

## 2014-03-28 DIAGNOSIS — I10 Essential (primary) hypertension: Secondary | ICD-10-CM | POA: Diagnosis not present

## 2014-03-28 DIAGNOSIS — E780 Pure hypercholesterolemia: Secondary | ICD-10-CM | POA: Diagnosis not present

## 2014-03-28 DIAGNOSIS — Z800 Family history of malignant neoplasm of digestive organs: Secondary | ICD-10-CM | POA: Diagnosis not present

## 2014-03-30 ENCOUNTER — Encounter: Payer: Self-pay | Admitting: Gastroenterology

## 2014-03-30 NOTE — Telephone Encounter (Signed)
Called patient back and left her a message to call me back

## 2014-03-30 NOTE — Telephone Encounter (Signed)
Called and spoke with the patient and Dr Minna Antis gave her 2 new medications are Valsartan 160mg  daily and Pravastatin 80mg .  She is going to go get a pulse ox and keep a journal and legs are better.  She has an up coming apt with Dr Rayann Heman on 11/30. Also request samples of Xarelto  Will leave out front

## 2014-03-30 NOTE — Telephone Encounter (Signed)
FOLLOW UP    Pt returned this office call. Please give her a call back.

## 2014-03-31 DIAGNOSIS — M545 Low back pain: Secondary | ICD-10-CM | POA: Diagnosis not present

## 2014-03-31 DIAGNOSIS — M9903 Segmental and somatic dysfunction of lumbar region: Secondary | ICD-10-CM | POA: Diagnosis not present

## 2014-04-18 ENCOUNTER — Encounter: Payer: Self-pay | Admitting: Physician Assistant

## 2014-04-18 ENCOUNTER — Ambulatory Visit (INDEPENDENT_AMBULATORY_CARE_PROVIDER_SITE_OTHER): Payer: Medicare Other | Admitting: Physician Assistant

## 2014-04-18 VITALS — BP 140/70 | HR 49 | Ht 67.0 in | Wt 233.0 lb

## 2014-04-18 DIAGNOSIS — I10 Essential (primary) hypertension: Secondary | ICD-10-CM

## 2014-04-18 DIAGNOSIS — I480 Paroxysmal atrial fibrillation: Secondary | ICD-10-CM | POA: Diagnosis not present

## 2014-04-18 DIAGNOSIS — R002 Palpitations: Secondary | ICD-10-CM

## 2014-04-18 DIAGNOSIS — R0602 Shortness of breath: Secondary | ICD-10-CM

## 2014-04-18 DIAGNOSIS — D649 Anemia, unspecified: Secondary | ICD-10-CM

## 2014-04-18 DIAGNOSIS — R609 Edema, unspecified: Secondary | ICD-10-CM

## 2014-04-18 DIAGNOSIS — E782 Mixed hyperlipidemia: Secondary | ICD-10-CM | POA: Diagnosis not present

## 2014-04-18 DIAGNOSIS — R001 Bradycardia, unspecified: Secondary | ICD-10-CM | POA: Diagnosis not present

## 2014-04-18 LAB — BASIC METABOLIC PANEL
BUN: 15 mg/dL (ref 6–23)
CO2: 23 mEq/L (ref 19–32)
Calcium: 8.9 mg/dL (ref 8.4–10.5)
Chloride: 101 mEq/L (ref 96–112)
Creatinine, Ser: 1.1 mg/dL (ref 0.4–1.2)
GFR: 52.72 mL/min — ABNORMAL LOW (ref >60.00)
GLUCOSE: 115 mg/dL — AB (ref 70–99)
Potassium: 4.2 mEq/L (ref 3.5–5.1)
Sodium: 134 mEq/L — ABNORMAL LOW (ref 135–145)

## 2014-04-18 LAB — BRAIN NATRIURETIC PEPTIDE: Pro B Natriuretic peptide (BNP): 175 pg/mL — ABNORMAL HIGH (ref 0.0–100.0)

## 2014-04-18 MED ORDER — METOPROLOL TARTRATE 12.5 MG HALF TABLET: 12.5000 mg | ORAL_TABLET | ORAL | Status: DC

## 2014-04-18 MED ORDER — WARFARIN SODIUM 5 MG PO TABS: 5.0000 mg | ORAL_TABLET | Freq: Every day | ORAL | Status: DC

## 2014-04-18 NOTE — Progress Notes (Signed)
Cardiology Office Note   Date:  04/18/2014   ID:  Rozetta, Stumpp 09/20/1940, MRN [id number]  PCP:  Tommy Medal, MD  Cardiologist/Electrophysiologist:  Dr. Thompson Grayer    History of Present Illness: Gloria Joseph is a 73 y.o. female with a hx of PAF, sinus bradycardia, HTN, HL, DM2, breast CA s/p R mastectomy.  Last seen by Dr. Thompson Grayer in 08/2013.    She returns today with complaints of feeling fatigued, more short of breath and occasional rapid palpitations.  She mostly notes increase HR and dyspnea when getting out of a hot shower.  She also notes lightheadedness at times. This seems to occur with minimal activity such as going to the grocery store.  Paradoxically, she can walk at other times without dyspnea.  She denies syncope.  However, she does describe episodes of near syncope over the last sever mos.  This seems to be getting worse.  She notes increase LE edema over the past 2 mos.  She was given Lasix to use prn by her PCP.  She has had some improvement with this. She sleeps on 2 pillows chronically.  She denies PND.  She brings in a report of labs drawn by her PCP.  She tells me that her PCP was concerned about her anemia worsening.  She is scheduled for a colonoscopy in January.  Of note, she is concerned that Xarelto is causing her symptoms.  She is also concerned about the cost. She would like to change to a different anticoagulant.     Studies:   - Echo (11/12):   mild LVH. EF 55%. Wall motion was normal.  Grade 1 diastolic dysfunction, Trivial AI.  Aortic root 69mm. Ascending aorta was mildly dilated.  Mild LAE.  Normal RVF. PA peak pressure: 27mm Hg   - Nuclear (02/2013):  Normal stress nuclear study.  LV Ejection Fraction: 67%  - Renal Artery [place] (1/13):  Normal RA bilaterally    Recent Labs: 09/23/2013: ALT 17; LDL (calc) 132*; TSH 2.70 12/08/2013: BUN 17; Creatinine 1.1; Potassium 4.5; Sodium 138  03/09/14:  Hgb 11, Cr 1.2, BUN 18, K 4.8, A1c 6.2, TSH  3.110, LDL 81  Recent Radiology: No results found.    Wt Readings from Last 3 Encounters:  04/18/14 233 lb (105.688 kg)  12/08/13 219 lb 6.4 oz (99.519 kg)  09/23/13 213 lb (96.616 kg)     Past Medical History  Diagnosis Date  . Diabetes mellitus, type 2     diet controlled  . Hypertension     medicine refractory  . Osteopenia   . Hyperlipidemia   . Cancer     breast CA s/p Right mastectomy  . Hypothyroidism     s/p thryroid gland ablation  . Neuromuscular disorder   . Atrial fibrillation 04/13/2011    on Xarelto for stroke prevention  . Anemia 12/18/2011  . Renal insufficiency, mild 09/23/2013    Current Outpatient Prescriptions  Medication Sig Dispense Refill  . citalopram (CELEXA) 20 MG tablet Take 1 tablet (20 mg total) by mouth daily. 90 tablet 1  . furosemide (LASIX) 20 MG tablet Take 20 mg by mouth as needed.     . hydrALAZINE (APRESOLINE) 100 MG tablet 1& 1/2 pills bid 270 tablet 1  . isosorbide mononitrate (IMDUR) 30 MG 24 hr tablet Take 1 tablet (30 mg total) by mouth daily. 90 tablet 1  . levothyroxine (SYNTHROID, LEVOTHROID) 112 MCG tablet TAKE 1 TABLET DAILY EXCEPT 1/2 TABLET ON TUESDAYS,  THURSDAYS, AND SATURDAYS 90 tablet 2  . metoprolol tartrate (LOPRESSOR) 12.5 mg TABS tablet Take 0.5 tablets (12.5 mg total) by mouth 2 (two) times daily. 180 tablet 1  . pravastatin (PRAVACHOL) 80 MG tablet Take 80 mg by mouth daily.     . simvastatin (ZOCOR) 20 MG tablet Take 1 tablet (20 mg total) by mouth at bedtime. 90 tablet 3  . spironolactone (ALDACTONE) 50 MG tablet Take 1 tablet (50 mg total) by mouth daily. 90 tablet 1  . valsartan (DIOVAN) 160 MG tablet Take 160 mg by mouth daily.     Alveda Reasons 20 MG TABS tablet TAKE 1 TABLET EVERY DAY WITH SUPPER 30 tablet 6   No current facility-administered medications for this visit.     Allergies:   Lisinopril; Hctz; Spironolactone; Penicillins; and Amlodipine besylate   Social History:  The patient  reports that she has  never smoked. She has never used smokeless tobacco. She reports that she does not drink alcohol or use illicit drugs.   Family History:  The patient's family history includes Alcohol abuse in her father; Breast cancer in her other; Cancer in her maternal grandfather, other, and other; Depression in her mother; Diabetes in her other; Heart attack (age of onset: 21) in her brother.    ROS:  Please see the history of present illness.   She denies any melena, hematochezia.   All other systems reviewed and negative.    PHYSICAL EXAM: VS:  BP 140/70 mmHg  Pulse 49  Ht 5\' 7"  (1.702 m)  Wt 233 lb (105.688 kg)  BMI 36.48 kg/m2 Well nourished, well developed, in no acute distress HEENT: normal Neck: no JVD 90 degrees Cardiac:  normal S1, S2;  RRR; no murmur   Lungs:   clear to auscultation bilaterally, no wheezing, rhonchi or rales Abd: soft, nontender, no hepatomegaly Ext:   No edema Skin: warm and dry Neuro:  CNs 2-12 intact, no focal abnormalities noted  EKG:  Sinus bradycardia, HR 49, normal axis, first-degree AV block (PR 258 ms), anterior Q waves      ASSESSMENT AND PLAN:  1.  Shortness of breath:  Overall, I suspect she is symptomatic from bradycardia.      -  Check BMET, BNP.  Adjust Lasix if needed.    -  Obtain Echocardiogram.    -  Consider proceeding with Myoview if workup unrevealing. 2.  Bradycardia:  As noted, she has a lot of symptoms that I believe can be attributed to her bradycardia.  She has had some symptoms of near syncope.  Her HR is now in the 40s.  In review of her chart, her HR has not been this low in the past (as an outpatient).      -  Decrease Metoprolol Tartrate to once a day for one week, then DC.    -  Arrange Event Monitor. 3.  Anemia, unspecified anemia type:  She has fairly chronic, stable anemia.  In review of her chart, she has had Hgb's ranging from 12.1 to 10 between 2012 to 2014.  She has a colonoscopy pending.  At this point, it seems appropriate to  continue anticoagulation.  4.  Palpitations:  She notes this in the context of taking a hot shower.  Question if this is a vasovagal episode.      -  Obtain Event Monitor. 5.  Edema:  Suspect venous insufficiency.  Recent Alb 3.9.    -  Check BNP.    -  Obtain echocardiogram.    -  Bilateral compression stockings (OTC) 6.  PAF (paroxysmal atrial fibrillation):   Maintaining NSR.  She requests changing Xarelto to another anticoagulant.  After a long discussion, I have decided to change her to Coumadin.    -  Start Coumadin 5 mg QD.    -  DC Xarelto after 3 doses of Coumadin.    -  Coumadin clinic for INR check 5-7 days after 1st dose of Coumadin.  7.  Essential hypertension:  Controlled.  8.  HYPERLIPIDEMIA:  Continue Pravastatin.    Disposition:   FU with Dr. Thompson Grayer 6 weeks.    Signed, Versie Starks, MHS 04/18/2014 10:36 AM    [facility] [place], [place], [place]  [address] Phone: [phone]; Fax: [phone]

## 2014-04-18 NOTE — Patient Instructions (Signed)
Your physician has recommended you make the following change in your medication:  1. DECREASE METOPROLOL TO ONCE A DAY FOR 1 WEEK THEN STOP 2. START COUMADIN 5 MG DAILY 3. ONCE YOU HAVE BEEN ON THE COUMADIN FOR 3 DAYS YOU WILL STOP THE XARELTO  SINCE WE ARE STARTING YOU ON COUMADIN YOU WILL NEED TO HAVE A NEW PT APPT WITH OUR COUMADIN CLINIC WITHIN 5-7 DAYS FROM YOUR 1ST DOSE OF COUMADIN LAB WORK TODAY; BMET, BNP  PER SCOTT WEAVER, PAC YOU HAVE BEEN ADVISED TO GET OTC COMPRESSION STOCKINGS   Your physician has requested that you have an echocardiogram. Echocardiography is a painless test that uses sound waves to create images of your heart. It provides your doctor with information about the size and shape of your heart and how well your heart's chambers and valves are working. This procedure takes approximately one hour. There are no restrictions for this procedure.  Your physician has recommended that you wear an event monitor. Event monitors are medical devices that record the heart's electrical activity. Doctors most often [place] these monitors to diagnose arrhythmias. Arrhythmias are problems with the speed or rhythm of the heartbeat. The monitor is a small, portable device. You can wear one while you do your normal daily activities. This is usually used to diagnose what is causing palpitations/syncope (passing out).

## 2014-04-19 ENCOUNTER — Telehealth: Payer: Self-pay | Admitting: *Deleted

## 2014-04-19 DIAGNOSIS — I10 Essential (primary) hypertension: Secondary | ICD-10-CM

## 2014-04-19 MED ORDER — FUROSEMIDE 20 MG PO TABS: 20.0000 mg | ORAL_TABLET | ORAL | Status: DC

## 2014-04-19 NOTE — Telephone Encounter (Signed)
pt notified about lab results and to take lasix 2 times weekly with eating a banana on those days, bmet 12/15. Pt said thank you and asked for lasix refill.

## 2014-04-22 ENCOUNTER — Ambulatory Visit (HOSPITAL_COMMUNITY): Payer: Medicare Other | Attending: Physician Assistant | Admitting: Cardiology

## 2014-04-22 ENCOUNTER — Encounter: Payer: Self-pay | Admitting: Radiology

## 2014-04-22 ENCOUNTER — Encounter (INDEPENDENT_AMBULATORY_CARE_PROVIDER_SITE_OTHER): Payer: Medicare Other

## 2014-04-22 ENCOUNTER — Ambulatory Visit (INDEPENDENT_AMBULATORY_CARE_PROVIDER_SITE_OTHER): Payer: Medicare Other | Admitting: *Deleted

## 2014-04-22 DIAGNOSIS — I480 Paroxysmal atrial fibrillation: Secondary | ICD-10-CM

## 2014-04-22 DIAGNOSIS — R0602 Shortness of breath: Secondary | ICD-10-CM

## 2014-04-22 DIAGNOSIS — R001 Bradycardia, unspecified: Secondary | ICD-10-CM | POA: Diagnosis not present

## 2014-04-22 LAB — POCT INR: INR: 1.8

## 2014-04-22 NOTE — Progress Notes (Signed)
Echo performed. 

## 2014-04-22 NOTE — Progress Notes (Signed)
lifewatch 30 day monitor applied. EOS 05-23-14

## 2014-04-25 ENCOUNTER — Other Ambulatory Visit (HOSPITAL_COMMUNITY): Payer: Medicare Other

## 2014-04-26 ENCOUNTER — Telehealth: Payer: Self-pay | Admitting: *Deleted

## 2014-04-26 ENCOUNTER — Encounter: Payer: Self-pay | Admitting: Physician Assistant

## 2014-04-26 ENCOUNTER — Ambulatory Visit (INDEPENDENT_AMBULATORY_CARE_PROVIDER_SITE_OTHER): Payer: Medicare Other

## 2014-04-26 DIAGNOSIS — I480 Paroxysmal atrial fibrillation: Secondary | ICD-10-CM

## 2014-04-26 LAB — POCT INR: INR: 2

## 2014-04-26 NOTE — Telephone Encounter (Signed)
Pt notified about echo results with verbal understanding to results given.

## 2014-04-30 ENCOUNTER — Other Ambulatory Visit: Payer: Self-pay | Admitting: Internal Medicine

## 2014-05-03 ENCOUNTER — Other Ambulatory Visit (INDEPENDENT_AMBULATORY_CARE_PROVIDER_SITE_OTHER): Payer: Medicare Other | Admitting: *Deleted

## 2014-05-03 ENCOUNTER — Ambulatory Visit (INDEPENDENT_AMBULATORY_CARE_PROVIDER_SITE_OTHER): Payer: Medicare Other | Admitting: Surgery

## 2014-05-03 DIAGNOSIS — Z5181 Encounter for therapeutic drug level monitoring: Secondary | ICD-10-CM | POA: Diagnosis not present

## 2014-05-03 DIAGNOSIS — I480 Paroxysmal atrial fibrillation: Secondary | ICD-10-CM | POA: Diagnosis not present

## 2014-05-03 DIAGNOSIS — I10 Essential (primary) hypertension: Secondary | ICD-10-CM

## 2014-05-03 LAB — BASIC METABOLIC PANEL
BUN: 19 mg/dL (ref 6–23)
CO2: 21 meq/L (ref 19–32)
Calcium: 8.9 mg/dL (ref 8.4–10.5)
Chloride: 101 mEq/L (ref 96–112)
Creatinine, Ser: 1.1 mg/dL (ref 0.4–1.2)
GFR: 51.07 mL/min — ABNORMAL LOW (ref >60.00)
Glucose, Bld: 167 mg/dL — ABNORMAL HIGH (ref 70–99)
POTASSIUM: 4.2 meq/L (ref 3.5–5.1)
Sodium: 131 mEq/L — ABNORMAL LOW (ref 135–145)

## 2014-05-03 LAB — POCT INR: INR: 1.9

## 2014-05-03 MED ORDER — WARFARIN SODIUM 5 MG PO TABS: ORAL_TABLET | ORAL | Status: DC

## 2014-05-04 ENCOUNTER — Telehealth: Payer: Self-pay | Admitting: *Deleted

## 2014-05-04 NOTE — Telephone Encounter (Signed)
lmptcb x 2 for lab results; results were sent to [place] as well yesterday.

## 2014-05-04 NOTE — Telephone Encounter (Signed)
ptcb said she was sorry just rtning my call however she was out of town for a funeral and on her way back home. Pt notified of lab results with verbal understanding today.

## 2014-05-10 ENCOUNTER — Ambulatory Visit (INDEPENDENT_AMBULATORY_CARE_PROVIDER_SITE_OTHER): Payer: Medicare Other | Admitting: *Deleted

## 2014-05-10 DIAGNOSIS — Z5181 Encounter for therapeutic drug level monitoring: Secondary | ICD-10-CM | POA: Diagnosis not present

## 2014-05-10 DIAGNOSIS — I480 Paroxysmal atrial fibrillation: Secondary | ICD-10-CM | POA: Diagnosis not present

## 2014-05-10 LAB — POCT INR: INR: 3.4

## 2014-05-11 ENCOUNTER — Encounter: Payer: Self-pay | Admitting: Internal Medicine

## 2014-05-18 ENCOUNTER — Ambulatory Visit (INDEPENDENT_AMBULATORY_CARE_PROVIDER_SITE_OTHER): Payer: Medicare Other | Admitting: *Deleted

## 2014-05-18 DIAGNOSIS — Z5181 Encounter for therapeutic drug level monitoring: Secondary | ICD-10-CM

## 2014-05-18 DIAGNOSIS — I480 Paroxysmal atrial fibrillation: Secondary | ICD-10-CM

## 2014-05-18 LAB — POCT INR: INR: 2.5

## 2014-05-18 NOTE — Progress Notes (Signed)
Late Entry  1600  The pt was in the office today for coumadin clinic appointment and she made them aware that she was "retaining fluid".  They took the pt to check-out to schedule an appointment and check-out asked me to speak with her to further evaluate her symptoms. The pt states that her SOB is the same as when she saw Richardson Dopp PA-C.  The pt does complain of lower extremity edema, 2+ at this time.  The pt has not used compression stockings as recommended.  The pt has used her PRN lasix 20mg  three times this week due to swelling. Weight 235 lbs, pulse 80 and O2 sat 98% on RA.  The pt is not in distress at this time.  I made her aware that I would speak with Richardson Dopp tomorrow about her symptoms and contact her with further recommendations at that time. Pt agreed with plan.

## 2014-05-19 ENCOUNTER — Other Ambulatory Visit: Payer: Self-pay

## 2014-05-19 DIAGNOSIS — R609 Edema, unspecified: Secondary | ICD-10-CM

## 2014-05-19 NOTE — Progress Notes (Signed)
Take Lasix 20 mg QD x 3 days, then change to 20 mg every other day. Increase dietary K+ for the 1st 3 days of increased Lasix only. BMET 1 week. Richardson Dopp, PA-C   05/19/2014 8:23 AM

## 2014-05-19 NOTE — Progress Notes (Signed)
I spoke with the pt and made her aware of medication change.  Pt will increase dietary potassium as instructed and have BMP drawn on 05/25/14.

## 2014-05-25 ENCOUNTER — Other Ambulatory Visit (INDEPENDENT_AMBULATORY_CARE_PROVIDER_SITE_OTHER): Payer: Medicare Other | Admitting: *Deleted

## 2014-05-25 ENCOUNTER — Encounter: Payer: Self-pay | Admitting: *Deleted

## 2014-05-25 ENCOUNTER — Ambulatory Visit (INDEPENDENT_AMBULATORY_CARE_PROVIDER_SITE_OTHER): Payer: Medicare Other | Admitting: Pharmacist

## 2014-05-25 DIAGNOSIS — R609 Edema, unspecified: Secondary | ICD-10-CM | POA: Diagnosis not present

## 2014-05-25 DIAGNOSIS — I480 Paroxysmal atrial fibrillation: Secondary | ICD-10-CM

## 2014-05-25 DIAGNOSIS — Z5181 Encounter for therapeutic drug level monitoring: Secondary | ICD-10-CM | POA: Diagnosis not present

## 2014-05-25 LAB — BASIC METABOLIC PANEL
BUN: 20 mg/dL (ref 6–23)
CALCIUM: 9.3 mg/dL (ref 8.4–10.5)
CO2: 27 meq/L (ref 19–32)
CREATININE: 1.2 mg/dL (ref 0.4–1.2)
Chloride: 99 mEq/L (ref 96–112)
GFR: 46.22 mL/min — AB (ref >60.00)
Glucose, Bld: 101 mg/dL — ABNORMAL HIGH (ref 70–99)
Potassium: 4.6 mEq/L (ref 3.5–5.1)
Sodium: 132 mEq/L — ABNORMAL LOW (ref 135–145)

## 2014-05-25 LAB — POCT INR: INR: 2.5

## 2014-05-25 NOTE — Progress Notes (Signed)
Patient ID: Gloria Joseph, female   DOB: 29-Mar-1941, 74 y.o.   MRN: [id number] Patient forgot to enclose cell phone charger when she mailed her event monitor back to [place]. On 05-25-14, she dropped this off at our office.  On 05-25-14, this was packaged in a [employer] return box with prepaid postage ([employer] RTN SVC # [phone]), and placed in outgoing mail.

## 2014-05-27 ENCOUNTER — Other Ambulatory Visit: Payer: Self-pay | Admitting: Internal Medicine

## 2014-05-27 ENCOUNTER — Telehealth: Payer: Self-pay | Admitting: *Deleted

## 2014-05-27 NOTE — Telephone Encounter (Signed)
pt notified about lab results with verbal understanding  

## 2014-05-30 ENCOUNTER — Ambulatory Visit: Payer: Medicare Other | Admitting: Internal Medicine

## 2014-05-30 ENCOUNTER — Encounter: Payer: Self-pay | Admitting: *Deleted

## 2014-05-30 DIAGNOSIS — Z23 Encounter for immunization: Secondary | ICD-10-CM | POA: Diagnosis not present

## 2014-05-30 DIAGNOSIS — E039 Hypothyroidism, unspecified: Secondary | ICD-10-CM | POA: Diagnosis not present

## 2014-05-30 DIAGNOSIS — I480 Paroxysmal atrial fibrillation: Secondary | ICD-10-CM | POA: Diagnosis not present

## 2014-05-30 DIAGNOSIS — I10 Essential (primary) hypertension: Secondary | ICD-10-CM | POA: Diagnosis not present

## 2014-05-30 DIAGNOSIS — G629 Polyneuropathy, unspecified: Secondary | ICD-10-CM | POA: Diagnosis not present

## 2014-06-06 ENCOUNTER — Ambulatory Visit (INDEPENDENT_AMBULATORY_CARE_PROVIDER_SITE_OTHER): Payer: Medicare Other | Admitting: Gastroenterology

## 2014-06-06 ENCOUNTER — Telehealth: Payer: Self-pay | Admitting: *Deleted

## 2014-06-06 ENCOUNTER — Encounter: Payer: Self-pay | Admitting: Gastroenterology

## 2014-06-06 VITALS — BP 150/72 | HR 76 | Ht 67.0 in | Wt 230.0 lb

## 2014-06-06 DIAGNOSIS — D638 Anemia in other chronic diseases classified elsewhere: Secondary | ICD-10-CM | POA: Diagnosis not present

## 2014-06-06 DIAGNOSIS — D509 Iron deficiency anemia, unspecified: Secondary | ICD-10-CM

## 2014-06-06 MED ORDER — NA SULFATE-K SULFATE-MG SULF 17.5-3.13-1.6 GM/177ML PO SOLN: 1.0000 | Freq: Once | ORAL | Status: DC

## 2014-06-06 NOTE — Progress Notes (Signed)
_                                                                                                                History of Present Illness:  Gloria Joseph is a 74 year old white female referred for evaluation of anemia.  On routine testing hemoglobin was 11 and October, 2015.  RBC indices were normal.  Patient states that she has tested Hemoccult negative in the past.  She is on no gastric irritants.  She takes Coumadin for atrial fibrillation.  Last colonoscopy was over 10 years ago.  She complains of fatigue.   Past Medical History  Diagnosis Date  . Diabetes mellitus, type 2     diet controlled  . Hypertension     medicine refractory  . Osteopenia   . Hyperlipidemia   . Cancer     breast CA s/p Right mastectomy  . Hypothyroidism     s/p thryroid gland ablation  . Neuromuscular disorder   . Atrial fibrillation 04/13/2011    on Xarelto for stroke prevention  . Anemia 12/18/2011  . Renal insufficiency, mild 09/23/2013  . Hx of echocardiogram     Echocardiogram (12/15): Moderate LVH, EF 55-60%, normal wall motion, grade 1 diastolic dysfunction, aortic sclerosis without stenosis, mild AI, MAC   Past Surgical History  Procedure Laterality Date  . Breast enhancement surgery    . Breast implant removal  2000    Unilaterally   . Cholecystectomy    . Colonoscopy  2002    Neg  . Rai  04/04/2006  . Mastectomy    . Tubal ligation    . Breast surgery     family history includes Alcohol abuse in her father; Breast cancer in her other; Cancer in her maternal grandfather, other, and other; Depression in her mother; Diabetes in her other; Heart attack (age of onset: 74) in her brother. Current Outpatient Prescriptions  Medication Sig Dispense Refill  . furosemide (LASIX) 20 MG tablet Take one tablet by mouth for 3 days and then change to every other day    . hydrALAZINE (APRESOLINE) 100 MG tablet 1& 1/2 pills bid 270 tablet 1  . isosorbide mononitrate (IMDUR) 30 MG 24 hr  tablet Take 1 tablet (30 mg total) by mouth daily. 90 tablet 1  . levothyroxine (SYNTHROID, LEVOTHROID) 112 MCG tablet TAKE 1 TABLET DAILY EXCEPT 1/2 TABLET ON TUESDAYS, THURSDAYS, AND SATURDAYS 90 tablet 2  . metoprolol succinate (TOPROL-XL) 25 MG 24 hr tablet Take 25 mg by mouth daily.    . pravastatin (PRAVACHOL) 80 MG tablet Take 80 mg by mouth daily.     Marland Kitchen spironolactone (ALDACTONE) 50 MG tablet Take 1 tablet (50 mg total) by mouth daily. 90 tablet 1  . valsartan (DIOVAN) 160 MG tablet Take 160 mg by mouth daily.     Marland Kitchen warfarin (COUMADIN) 5 MG tablet Take as directed by coumadin clinic 30 tablet 3   No current facility-administered medications for this visit.   Allergies as of 06/06/2014 - Review  Complete 06/06/2014  Allergen Reaction Noted  . Lisinopril  12/06/2010  . Hctz [hydrochlorothiazide]  02/14/2011  . Spironolactone    . Penicillins Other (See Comments) 04/11/2011  . Amlodipine besylate  08/06/2010    reports that she has never smoked. She has never used smokeless tobacco. She reports that she does not drink alcohol or use illicit drugs.   Review of Systems: Pertinent positive and negative review of systems were noted in the above HPI section. All other review of systems were otherwise negative.  Vital signs were reviewed in today's medical record Physical Exam: General: Well developed , well nourished, no acute distress Skin: anicteric Head: Normocephalic and atraumatic Eyes:  sclerae anicteric, EOMI Ears: Normal auditory acuity Mouth: No deformity or lesions Neck: Supple, no masses or thyromegaly Lungs: Clear throughout to auscultation Heart: Regular rate and rhythm; no murmurs, rubs or bruits Abdomen: Soft, non tender and non distended. No masses, hepatosplenomegaly or hernias noted. Normal Bowel sounds Rectal:deferred Musculoskeletal: Symmetrical with no gross deformities  Skin: No lesions on visible extremities Pulses:  Normal pulses noted Extremities: No  clubbing, cyanosis, edema or deformities noted Neurological: Alert oriented x 4, grossly nonfocal Cervical Nodes:  No significant cervical adenopathy Inguinal Nodes: No significant inguinal adenopathy Psychological:  Alert and cooperative. Normal mood and affect  See Assessment and Plan under Problem List

## 2014-06-06 NOTE — Patient Instructions (Addendum)
You have been scheduled for a colonoscopy. Please follow written instructions given to you at your visit today.  Please pick up your prep kit at the pharmacy within the next 1-3 days. If you use inhalers (even only as needed), please bring them with you on the day of your procedure. Your physician has requested that you go to www.startemmi.com and enter the access code given to you at your visit today. This web site gives a general overview about your procedure. However, you should still follow specific instructions given to you by our office regarding your preparation for the procedure.  You will be contaced by our office prior to your procedure for directions on holding your Coumadin/Warfarin.  If you do not hear from our office 1 week prior to your scheduled procedure, please call [phone] to discuss.  GO to the basement/Lab for your IFOB kit

## 2014-06-06 NOTE — Telephone Encounter (Signed)
  06/06/2014   RE: LYNISE PORR DOB: [birth year] MRN: [id number]   Dear Thompson Grayer, MD   We have scheduled the above patient for an endoscopic procedure. Our records show that she is on anticoagulation therapy.   Please advise as to how long the patient may come off her therapy of Coumadin prior to the procedure, which is scheduled for 07/28/2014.  Please fax back/ or route the completed form to [place]  at [phone].   Sincerely,    Genella Mech

## 2014-06-06 NOTE — Assessment & Plan Note (Signed)
She has a mild anemia that is probably multifactorial.  By history there is no evidence for GI bleeding.  Nonetheless, occult bleeding should be ruled out.  Since it has been over 10 years since her last colonoscopy she will be scheduled for this.  The risk of holding anticoagulation therapy or antiplatelet medications was discussed including the increased risk for thromboembolic disease that may include DVT, pulmonary emboli and stroke. The patient understands this risk and is willing to proceed with temporally holding the medication provided that this is approved by her PCP or cardiologist.

## 2014-06-07 NOTE — Telephone Encounter (Signed)
Will forward to anticoagulation team for evaluation using the new protocol.

## 2014-06-08 ENCOUNTER — Ambulatory Visit (INDEPENDENT_AMBULATORY_CARE_PROVIDER_SITE_OTHER): Payer: Medicare Other | Admitting: *Deleted

## 2014-06-08 ENCOUNTER — Other Ambulatory Visit: Payer: Self-pay | Admitting: *Deleted

## 2014-06-08 DIAGNOSIS — I480 Paroxysmal atrial fibrillation: Secondary | ICD-10-CM

## 2014-06-08 DIAGNOSIS — Z5181 Encounter for therapeutic drug level monitoring: Secondary | ICD-10-CM

## 2014-06-08 LAB — POCT INR: INR: 2.5

## 2014-06-08 NOTE — Telephone Encounter (Signed)
Called Patient to inform that it is ok to hold coumadin 5 days before procedure

## 2014-06-08 NOTE — Telephone Encounter (Signed)
Pt has a CHADS score of 2.  No history of TIA or stroke.  Okay to hold Coumadin x 5 days prior to colonoscopy per protocol.

## 2014-06-15 ENCOUNTER — Other Ambulatory Visit (INDEPENDENT_AMBULATORY_CARE_PROVIDER_SITE_OTHER): Payer: Medicare Other

## 2014-06-15 DIAGNOSIS — D509 Iron deficiency anemia, unspecified: Secondary | ICD-10-CM

## 2014-06-15 LAB — FECAL OCCULT BLOOD, IMMUNOCHEMICAL: Fecal Occult Bld: NEGATIVE

## 2014-06-30 ENCOUNTER — Ambulatory Visit (INDEPENDENT_AMBULATORY_CARE_PROVIDER_SITE_OTHER): Payer: Medicare Other

## 2014-06-30 ENCOUNTER — Ambulatory Visit (INDEPENDENT_AMBULATORY_CARE_PROVIDER_SITE_OTHER): Payer: Medicare Other | Admitting: Internal Medicine

## 2014-06-30 ENCOUNTER — Encounter: Payer: Self-pay | Admitting: Internal Medicine

## 2014-06-30 VITALS — BP 158/90 | HR 82 | Ht 66.0 in | Wt 234.0 lb

## 2014-06-30 DIAGNOSIS — R001 Bradycardia, unspecified: Secondary | ICD-10-CM | POA: Diagnosis not present

## 2014-06-30 DIAGNOSIS — I480 Paroxysmal atrial fibrillation: Secondary | ICD-10-CM

## 2014-06-30 DIAGNOSIS — Z5181 Encounter for therapeutic drug level monitoring: Secondary | ICD-10-CM

## 2014-06-30 DIAGNOSIS — I10 Essential (primary) hypertension: Secondary | ICD-10-CM

## 2014-06-30 LAB — POCT INR: INR: 1.8

## 2014-06-30 NOTE — Patient Instructions (Signed)
Your physician wants you to follow-up in: 6 months with Donna Carroll, NP. You will receive a reminder letter in the mail two months in advance. If you don't receive a letter, please call our office to schedule the follow-up appointment.  

## 2014-06-30 NOTE — Progress Notes (Signed)
PCP:  Tommy Medal, MD  The patient presents today for routine electrophysiology followup.  Since last being seen in our clinic, the patient developed dyspnea with exertion and lower extremity edema. She was noted to be bradycardic. Metoprolol was stopped with resolution of sob. She was placed on diuretic and compression hoses and LEE much improved. She had been very immobile for the last year due to back and leg issues and has been seeing a chiropractor with much improvement and has gotten back to getting on the treadmill for a few minutes a week. She wore a monitor which sound low afib burden , less than 1%, mostly SR with bradycardia mostly at night. She transitioned over to [place] recently due to the cost.   Today, she denies symptoms of palpitations, chest pain, shortness of breath, orthopnea, PND, lower extremity edema improved, dizziness,  or neurologic sequela.  The patient feels that she is tolerating medications without difficulties and is otherwise without complaint today.   Past Medical History  Diagnosis Date  . Diabetes mellitus, type 2     diet controlled  . Hypertension     medicine refractory  . Osteopenia   . Hyperlipidemia   . Cancer     breast CA s/p Right mastectomy  . Hypothyroidism     s/p thryroid gland ablation  . Neuromuscular disorder   . Atrial fibrillation 04/13/2011    on Xarelto for stroke prevention  . Anemia 12/18/2011  . Renal insufficiency, mild 09/23/2013  . Hx of echocardiogram     Echocardiogram (12/15): Moderate LVH, EF 55-60%, normal wall motion, grade 1 diastolic dysfunction, aortic sclerosis without stenosis, mild AI, MAC   Past Surgical History  Procedure Laterality Date  . Breast enhancement surgery    . Breast implant removal  2000    Unilaterally   . Cholecystectomy    . Colonoscopy  2002    Neg  . Rai  04/04/2006  . Mastectomy    . Tubal ligation    . Breast surgery      Current Outpatient Prescriptions  Medication Sig Dispense  Refill  . CALCIUM-MAGNESIUM-ZINC PO Take 1 capsule by mouth daily.    . Cholecalciferol (VITAMIN D-3) 1000 UNITS CAPS Take 1 capsule by mouth daily.    . Coenzyme Q10 (CO Q 10 PO) Take 1 capsule by mouth daily.    . Flaxseed, Linseed, (FLAXSEED OIL PO) Take 1 tablet by mouth daily.    . furosemide (LASIX) 20 MG tablet Take one tablet by mouth for 3 days and then change to every other day    . hydrALAZINE (APRESOLINE) 100 MG tablet Take 150 mg by mouth 2 (two) times daily.    . isosorbide mononitrate (IMDUR) 30 MG 24 hr tablet Take 1 tablet (30 mg total) by mouth daily. 90 tablet 1  . levothyroxine (SYNTHROID, LEVOTHROID) 112 MCG tablet TAKE 1 TABLET DAILY EXCEPT 1/2 TABLET ON TUESDAYS, THURSDAYS, AND SATURDAYS 90 tablet 2  . Multiple Vitamins-Minerals (MULTIVITAMINS) CHEW Chew 1 tablet by mouth daily.    . Na Sulfate-K Sulfate-Mg Sulf (SUPREP BOWEL PREP) SOLN Take 1 kit by mouth once. 1 Bottle 0  . Omega-3 Fatty Acids (FISH OIL PO) Take 1 capsule by mouth daily.    . pravastatin (PRAVACHOL) 80 MG tablet Take 80 mg by mouth daily.     Marland Kitchen spironolactone (ALDACTONE) 50 MG tablet Take 1 tablet (50 mg total) by mouth daily. 90 tablet 1  . valsartan (DIOVAN) 160 MG tablet Take 160 mg by  mouth daily.     . vitamin B-12 (CYANOCOBALAMIN) 1000 MCG tablet Take 1,000 mcg by mouth daily.    Marland Kitchen warfarin (COUMADIN) 5 MG tablet Take as directed by coumadin clinic 30 tablet 3   No current facility-administered medications for this visit.    Allergies  Allergen Reactions  . Lisinopril     ? Angioedema (NOT ARB candidate) Because of a history of documented adverse serious drug reaction;Medi Alert bracelet  is recommended  . Hctz [Hydrochlorothiazide]     hyponatremia  . Spironolactone     REACTION: low potassium ???  . Penicillins Other (See Comments)    Pt states medication doesn't work for her d/t her usage of it several years ago.  . Amlodipine Besylate     REACTION: edema    History   Social  History  . Marital Status: Divorced    Spouse Name: N/A  . Number of Children: N/A  . Years of Education: N/A   Occupational History  . Not on file.   Social History Main Topics  . Smoking status: Never Smoker   . Smokeless tobacco: Never Used  . Alcohol Use: No  . Drug Use: No  . Sexual Activity: No   Other Topics Concern  . Not on file   Social History Narrative    Family History  Problem Relation Age of Onset  . Depression Mother   . Alcohol abuse Father   . Heart attack Brother 31  . Cancer Other     Uncle, not specific  . Cancer Other     Aunt, not specific. GYN CA  . Breast cancer Other     Aunt, not specific  . Diabetes Other     Grandmother, not specific  . Cancer Maternal Grandfather     colon     Physical Exam: Filed Vitals:   06/30/14 0957  BP: 158/90  Pulse: 82  Height: '5\' 6"'  (1.676 m)  Weight: 234 lb (106.142 kg)    GEN- The patient is well appearing, alert and oriented x 3 today.   Head- normocephalic, atraumatic Eyes-  Sclera clear, conjunctiva pink Ears- hearing intact Oropharynx- clear Neck- supple, no JVP Lymph- no cervical lymphadenopathy Lungs- Clear to ausculation bilaterally, normal work of breathing Heart- Regular rate and rhythm, no murmurs, rubs or gallops, PMI not laterally displaced GI- soft, NT, ND, + BS Extremities- no clubbing, cyanosis, or edema Neuro- strength and sensation are intact  ekg today reveals sinus rhythm with first degree AV block at 82 bpm..Otherwise normal ekg.  Echo- Left ventricle: The cavity size was normal. Wall thickness was increased in a pattern of moderate LVH. Systolic function was normal. The estimated ejection fraction was in the range of 55% to 60%. Wall motion was normal; there were no regional wall motion abnormalities. Doppler parameters are consistent with abnormal left ventricular relaxation (grade 1 diastolic dysfunction). The E/e&' ratio is between 8-15,  suggesting indeterminate LV filling pressure. - Aortic valve: Sclerosis without stenosis. There was mild regurgitation. - Mitral valve: Calcified annulus. - Left atrium: The atrium was normal in size.  Assessment and Plan:4  1. PAF Well controlled No changes Continue warfarin  2. Sinus Bradycardia-  Resolved off BB  3. HTN Above goal Weight reduction and sodium restriction advised  4. LLE Improved with TED hose and diuretic Continue with exercise, weight loss efforts.  F/u 6 months in afib clinic  I have seen, examined the patient, and reviewed the above assessment and plan.  Changes  to above are made where necessary.  Doing well presently No changes   Co Sign: Thompson Grayer, MD 06/30/2014 10:10 PM

## 2014-07-09 ENCOUNTER — Other Ambulatory Visit: Payer: Self-pay | Admitting: Internal Medicine

## 2014-07-11 ENCOUNTER — Other Ambulatory Visit: Payer: Self-pay | Admitting: Internal Medicine

## 2014-07-12 ENCOUNTER — Other Ambulatory Visit: Payer: Self-pay | Admitting: Internal Medicine

## 2014-07-14 DIAGNOSIS — M9903 Segmental and somatic dysfunction of lumbar region: Secondary | ICD-10-CM | POA: Diagnosis not present

## 2014-07-14 DIAGNOSIS — M545 Low back pain: Secondary | ICD-10-CM | POA: Diagnosis not present

## 2014-07-21 ENCOUNTER — Ambulatory Visit (INDEPENDENT_AMBULATORY_CARE_PROVIDER_SITE_OTHER): Payer: Medicare Other

## 2014-07-21 DIAGNOSIS — I480 Paroxysmal atrial fibrillation: Secondary | ICD-10-CM | POA: Diagnosis not present

## 2014-07-21 DIAGNOSIS — Z5181 Encounter for therapeutic drug level monitoring: Secondary | ICD-10-CM

## 2014-07-21 LAB — POCT INR: INR: 3.1

## 2014-07-25 ENCOUNTER — Telehealth: Payer: Self-pay | Admitting: Gastroenterology

## 2014-07-25 NOTE — Telephone Encounter (Signed)
Patient takes Lasix every other day. She would prefer to hold it on her procedure day because it causes her to urinate so urgently. She will alter her schedule for taking Lasix.

## 2014-07-28 ENCOUNTER — Ambulatory Visit (AMBULATORY_SURGERY_CENTER): Payer: Medicare Other | Admitting: Gastroenterology

## 2014-07-28 ENCOUNTER — Encounter: Payer: Self-pay | Admitting: Gastroenterology

## 2014-07-28 VITALS — BP 122/51 | HR 43 | Temp 97.4°F | Resp 20 | Ht 66.0 in | Wt 234.0 lb

## 2014-07-28 DIAGNOSIS — E039 Hypothyroidism, unspecified: Secondary | ICD-10-CM | POA: Diagnosis not present

## 2014-07-28 DIAGNOSIS — D649 Anemia, unspecified: Secondary | ICD-10-CM | POA: Diagnosis not present

## 2014-07-28 DIAGNOSIS — I4891 Unspecified atrial fibrillation: Secondary | ICD-10-CM | POA: Diagnosis not present

## 2014-07-28 DIAGNOSIS — E119 Type 2 diabetes mellitus without complications: Secondary | ICD-10-CM | POA: Diagnosis not present

## 2014-07-28 DIAGNOSIS — D509 Iron deficiency anemia, unspecified: Secondary | ICD-10-CM

## 2014-07-28 MED ORDER — SODIUM CHLORIDE 0.9 % IV SOLN: 500.0000 mL | INTRAVENOUS | Status: DC

## 2014-07-28 NOTE — Patient Instructions (Addendum)
YOU HAD AN ENDOSCOPIC PROCEDURE TODAY AT [facility]:   Refer to the procedure report that was given to you for any specific questions about what was found during the examination.  If the procedure report does not answer your questions, please call your gastroenterologist to clarify.  If you requested that your care partner not be given the details of your procedure findings, then the procedure report has been included in a sealed envelope for you to review at your convenience later.  YOU SHOULD EXPECT: Some feelings of bloating in the abdomen. Passage of more gas than usual.  Walking can help get rid of the air that was put into your GI tract during the procedure and reduce the bloating. If you had a lower endoscopy (such as a colonoscopy or flexible sigmoidoscopy) you may notice spotting of blood in your stool or on the toilet paper. If you underwent a bowel prep for your procedure, you may not have a normal bowel movement for a few days.  Please Note:  You might notice some irritation and congestion in your nose or some drainage.  This is from the oxygen used during your procedure.  There is no need for concern and it should clear up in a day or so.  SYMPTOMS TO REPORT IMMEDIATELY:   Following lower endoscopy (colonoscopy or flexible sigmoidoscopy):  Excessive amounts of blood in the stool  Significant tenderness or worsening of abdominal pains  Swelling of the abdomen that is new, acute  Fever of 100F or higher    For urgent or emergent issues, a gastroenterologist can be reached at any hour by calling [phone].   DIET: Your first meal following the procedure should be a small meal and then it is ok to progress to your normal diet. Heavy or fried foods are harder to digest and may make you feel nauseous or bloated.  Likewise, meals heavy in dairy and vegetables can increase bloating.  Drink plenty of fluids but you should avoid alcoholic beverages for 24  hours.  ACTIVITY:  You should plan to take it easy for the rest of today and you should NOT DRIVE or use heavy machinery until tomorrow (because of the sedation medicines used during the test).    FOLLOW UP: Our staff will call the number listed on your records the next business day following your procedure to check on you and address any questions or concerns that you may have regarding the information given to you following your procedure. If we do not reach you, we will leave a message.  However, if you are feeling well and you are not experiencing any problems, there is no need to return our call.  We will assume that you have returned to your regular daily activities without incident.  If any biopsies were taken you will be contacted by phone or by letter within the next 1-3 weeks.  Please call us at [phone] if you have not heard about the biopsies in 3 weeks.    SIGNATURES/CONFIDENTIALITY: You and/or your care partner have signed paperwork which will be entered into your electronic medical record.  These signatures attest to the fact that that the information above on your After Visit Summary has been reviewed and is understood.  Full responsibility of the confidentiality of this discharge information lies with you and/or your care-partner.     HEMEOCCULT CARDS GIVEN TO YOU TODAY AS DR Deatra Ina ORDERED FOLLOW UP HEMEOCCULTS .iNSTRUCTIONS ARE INCLUDED   OK TO  RESUME COUMADIN TODAY PER DR Deatra Ina

## 2014-07-28 NOTE — Op Note (Addendum)
[place]  [address]. [place], [address]   COLONOSCOPY PROCEDURE REPORT  PATIENT: Gloria, Joseph  MR#: [id number] BIRTHDATE: Apr 28, 1941 , 73  yrs. old GENDER: female ENDOSCOPIST: Inda Castle, MD REFERRED [id number] Minna Antis, M.D. PROCEDURE DATE:  07/28/2014 PROCEDURE:   Colonoscopy, diagnostic First Screening Colonoscopy - Avg.  risk and is 50 yrs.  old or older - No.  Prior Negative Screening - Now for repeat screening. 10 or more years since last screening  History of Adenoma - Now for follow-up colonoscopy & has been > or = to 3 yrs.  N/A ASA CLASS:   Class II INDICATIONS:anemia, non-specific, Average Risk, and Unexplained iron deficiency anemia. MEDICATIONS: Monitored anesthesia care and Propofol 200 mg IV  DESCRIPTION OF PROCEDURE:   After the risks benefits and alternatives of the procedure were thoroughly explained, informed consent was obtained.  The digital rectal exam revealed no abnormalities of the rectum.   The LB [id number]  endoscope was introduced through the anus and advanced to the cecum, which was identified by both the appendix and ileocecal valve. No adverse events experienced.   The quality of the prep was good, using MoviPrep (Suprep was used) excellent.  The instrument was then slowly withdrawn as the colon was fully examined.      COLON FINDINGS: A normal appearing cecum, ileocecal valve, and appendiceal orifice were identified.  The ascending, transverse, descending, sigmoid colon, and rectum appeared unremarkable. Retroflexed views revealed no abnormalities. The time to cecum = 4.0 Withdrawal time = 7.0   The scope was withdrawn and the procedure completed. COMPLICATIONS: There were no immediate complications.  ENDOSCOPIC IMPRESSION: Normal colonoscopy  RECOMMENDATIONS: Followup hemeoccults  eSigned:  Inda Castle, MD 07/28/2014 10:04 AM Revised: 07/28/2014 10:04 AM  cc:

## 2014-07-28 NOTE — Progress Notes (Signed)
Stable to RR 

## 2014-07-29 ENCOUNTER — Telehealth: Payer: Self-pay | Admitting: *Deleted

## 2014-07-29 NOTE — Telephone Encounter (Signed)
  Follow up Call-  Call back number 07/28/2014  Post procedure Call Back phone  # [phone]  Permission to leave phone message Yes     Patient questions:  Do you have a fever, pain , or abdominal swelling? No. Pain Score  0 *  Have you tolerated food without any problems? Yes.    Have you been able to return to your normal activities? Yes.    Do you have any questions about your discharge instructions: Diet   No. Medications  No. Follow up visit  No.  Do you have questions or concerns about your Care? No.  Actions: * If pain score is 4 or above: No action needed, pain <4.

## 2014-08-04 ENCOUNTER — Other Ambulatory Visit: Payer: Self-pay | Admitting: *Deleted

## 2014-08-04 ENCOUNTER — Ambulatory Visit (INDEPENDENT_AMBULATORY_CARE_PROVIDER_SITE_OTHER): Payer: Medicare Other | Admitting: *Deleted

## 2014-08-04 DIAGNOSIS — Z5181 Encounter for therapeutic drug level monitoring: Secondary | ICD-10-CM | POA: Diagnosis not present

## 2014-08-04 DIAGNOSIS — I480 Paroxysmal atrial fibrillation: Secondary | ICD-10-CM

## 2014-08-04 LAB — POCT INR: INR: 2.4

## 2014-08-11 DIAGNOSIS — M9903 Segmental and somatic dysfunction of lumbar region: Secondary | ICD-10-CM | POA: Diagnosis not present

## 2014-08-11 DIAGNOSIS — M545 Low back pain: Secondary | ICD-10-CM | POA: Diagnosis not present

## 2014-08-18 ENCOUNTER — Ambulatory Visit (INDEPENDENT_AMBULATORY_CARE_PROVIDER_SITE_OTHER): Payer: Medicare Other | Admitting: *Deleted

## 2014-08-18 DIAGNOSIS — I480 Paroxysmal atrial fibrillation: Secondary | ICD-10-CM

## 2014-08-18 DIAGNOSIS — Z5181 Encounter for therapeutic drug level monitoring: Secondary | ICD-10-CM

## 2014-08-18 LAB — POCT INR: INR: 4.1

## 2014-08-19 ENCOUNTER — Other Ambulatory Visit: Payer: Medicare Other

## 2014-08-26 ENCOUNTER — Ambulatory Visit (INDEPENDENT_AMBULATORY_CARE_PROVIDER_SITE_OTHER): Payer: Medicare Other | Admitting: *Deleted

## 2014-08-26 DIAGNOSIS — Z5181 Encounter for therapeutic drug level monitoring: Secondary | ICD-10-CM

## 2014-08-26 DIAGNOSIS — I480 Paroxysmal atrial fibrillation: Secondary | ICD-10-CM

## 2014-08-26 LAB — POCT INR: INR: 3.1

## 2014-09-05 ENCOUNTER — Other Ambulatory Visit: Payer: Self-pay | Admitting: Internal Medicine

## 2014-09-08 DIAGNOSIS — R7309 Other abnormal glucose: Secondary | ICD-10-CM | POA: Diagnosis not present

## 2014-09-08 DIAGNOSIS — M9903 Segmental and somatic dysfunction of lumbar region: Secondary | ICD-10-CM | POA: Diagnosis not present

## 2014-09-08 DIAGNOSIS — M545 Low back pain: Secondary | ICD-10-CM | POA: Diagnosis not present

## 2014-09-08 DIAGNOSIS — Z0000 Encounter for general adult medical examination without abnormal findings: Secondary | ICD-10-CM | POA: Diagnosis not present

## 2014-09-08 DIAGNOSIS — Z1389 Encounter for screening for other disorder: Secondary | ICD-10-CM | POA: Diagnosis not present

## 2014-09-09 ENCOUNTER — Ambulatory Visit (INDEPENDENT_AMBULATORY_CARE_PROVIDER_SITE_OTHER): Payer: Medicare Other | Admitting: Pharmacist

## 2014-09-09 DIAGNOSIS — I480 Paroxysmal atrial fibrillation: Secondary | ICD-10-CM | POA: Diagnosis not present

## 2014-09-09 DIAGNOSIS — Z5181 Encounter for therapeutic drug level monitoring: Secondary | ICD-10-CM | POA: Diagnosis not present

## 2014-09-09 LAB — POCT INR: INR: 2.8

## 2014-09-12 DIAGNOSIS — M545 Low back pain: Secondary | ICD-10-CM | POA: Diagnosis not present

## 2014-09-12 DIAGNOSIS — M9903 Segmental and somatic dysfunction of lumbar region: Secondary | ICD-10-CM | POA: Diagnosis not present

## 2014-09-15 DIAGNOSIS — M9903 Segmental and somatic dysfunction of lumbar region: Secondary | ICD-10-CM | POA: Diagnosis not present

## 2014-09-15 DIAGNOSIS — M545 Low back pain: Secondary | ICD-10-CM | POA: Diagnosis not present

## 2014-09-22 DIAGNOSIS — M545 Low back pain: Secondary | ICD-10-CM | POA: Diagnosis not present

## 2014-09-22 DIAGNOSIS — M9903 Segmental and somatic dysfunction of lumbar region: Secondary | ICD-10-CM | POA: Diagnosis not present

## 2014-09-30 ENCOUNTER — Ambulatory Visit (INDEPENDENT_AMBULATORY_CARE_PROVIDER_SITE_OTHER): Payer: Medicare Other | Admitting: *Deleted

## 2014-09-30 DIAGNOSIS — Z5181 Encounter for therapeutic drug level monitoring: Secondary | ICD-10-CM

## 2014-09-30 DIAGNOSIS — I480 Paroxysmal atrial fibrillation: Secondary | ICD-10-CM

## 2014-09-30 LAB — POCT INR: INR: 2.9

## 2014-10-13 DIAGNOSIS — M9903 Segmental and somatic dysfunction of lumbar region: Secondary | ICD-10-CM | POA: Diagnosis not present

## 2014-10-13 DIAGNOSIS — M545 Low back pain: Secondary | ICD-10-CM | POA: Diagnosis not present

## 2014-10-28 ENCOUNTER — Ambulatory Visit (INDEPENDENT_AMBULATORY_CARE_PROVIDER_SITE_OTHER): Payer: Medicare Other | Admitting: *Deleted

## 2014-10-28 DIAGNOSIS — Z5181 Encounter for therapeutic drug level monitoring: Secondary | ICD-10-CM

## 2014-10-28 DIAGNOSIS — I480 Paroxysmal atrial fibrillation: Secondary | ICD-10-CM

## 2014-10-28 LAB — POCT INR: INR: 3

## 2014-11-14 ENCOUNTER — Other Ambulatory Visit: Payer: Self-pay

## 2014-11-22 DIAGNOSIS — R7309 Other abnormal glucose: Secondary | ICD-10-CM | POA: Diagnosis not present

## 2014-11-22 DIAGNOSIS — I10 Essential (primary) hypertension: Secondary | ICD-10-CM | POA: Diagnosis not present

## 2014-11-22 DIAGNOSIS — E039 Hypothyroidism, unspecified: Secondary | ICD-10-CM | POA: Diagnosis not present

## 2014-11-22 DIAGNOSIS — E559 Vitamin D deficiency, unspecified: Secondary | ICD-10-CM | POA: Diagnosis not present

## 2014-11-29 DIAGNOSIS — E559 Vitamin D deficiency, unspecified: Secondary | ICD-10-CM | POA: Diagnosis not present

## 2014-11-29 DIAGNOSIS — I10 Essential (primary) hypertension: Secondary | ICD-10-CM | POA: Diagnosis not present

## 2014-11-29 DIAGNOSIS — R7309 Other abnormal glucose: Secondary | ICD-10-CM | POA: Diagnosis not present

## 2014-11-29 DIAGNOSIS — E780 Pure hypercholesterolemia: Secondary | ICD-10-CM | POA: Diagnosis not present

## 2014-12-02 ENCOUNTER — Ambulatory Visit (INDEPENDENT_AMBULATORY_CARE_PROVIDER_SITE_OTHER): Payer: Medicare Other

## 2014-12-02 DIAGNOSIS — Z5181 Encounter for therapeutic drug level monitoring: Secondary | ICD-10-CM | POA: Diagnosis not present

## 2014-12-02 DIAGNOSIS — I480 Paroxysmal atrial fibrillation: Secondary | ICD-10-CM | POA: Diagnosis not present

## 2014-12-02 LAB — POCT INR: INR: 2.6

## 2015-01-13 ENCOUNTER — Ambulatory Visit (INDEPENDENT_AMBULATORY_CARE_PROVIDER_SITE_OTHER): Payer: Medicare Other | Admitting: Pharmacist

## 2015-01-13 DIAGNOSIS — I480 Paroxysmal atrial fibrillation: Secondary | ICD-10-CM | POA: Diagnosis not present

## 2015-01-13 DIAGNOSIS — Z5181 Encounter for therapeutic drug level monitoring: Secondary | ICD-10-CM | POA: Diagnosis not present

## 2015-01-13 LAB — POCT INR: INR: 2.9

## 2015-01-18 ENCOUNTER — Other Ambulatory Visit: Payer: Self-pay | Admitting: Internal Medicine

## 2015-02-22 ENCOUNTER — Other Ambulatory Visit: Payer: Self-pay | Admitting: Internal Medicine

## 2015-02-23 ENCOUNTER — Ambulatory Visit (INDEPENDENT_AMBULATORY_CARE_PROVIDER_SITE_OTHER): Payer: Medicare Other | Admitting: *Deleted

## 2015-02-23 DIAGNOSIS — Z5181 Encounter for therapeutic drug level monitoring: Secondary | ICD-10-CM

## 2015-02-23 DIAGNOSIS — E039 Hypothyroidism, unspecified: Secondary | ICD-10-CM | POA: Diagnosis not present

## 2015-02-23 DIAGNOSIS — R7309 Other abnormal glucose: Secondary | ICD-10-CM | POA: Diagnosis not present

## 2015-02-23 DIAGNOSIS — I480 Paroxysmal atrial fibrillation: Secondary | ICD-10-CM

## 2015-02-23 DIAGNOSIS — I10 Essential (primary) hypertension: Secondary | ICD-10-CM | POA: Diagnosis not present

## 2015-02-23 DIAGNOSIS — E559 Vitamin D deficiency, unspecified: Secondary | ICD-10-CM | POA: Diagnosis not present

## 2015-02-23 LAB — POCT INR: INR: 2.1

## 2015-03-01 DIAGNOSIS — R7309 Other abnormal glucose: Secondary | ICD-10-CM | POA: Diagnosis not present

## 2015-03-01 DIAGNOSIS — E559 Vitamin D deficiency, unspecified: Secondary | ICD-10-CM | POA: Diagnosis not present

## 2015-03-01 DIAGNOSIS — I10 Essential (primary) hypertension: Secondary | ICD-10-CM | POA: Diagnosis not present

## 2015-03-01 DIAGNOSIS — E7800 Pure hypercholesterolemia, unspecified: Secondary | ICD-10-CM | POA: Diagnosis not present

## 2015-03-14 DIAGNOSIS — M545 Low back pain: Secondary | ICD-10-CM | POA: Diagnosis not present

## 2015-03-14 DIAGNOSIS — M9901 Segmental and somatic dysfunction of cervical region: Secondary | ICD-10-CM | POA: Diagnosis not present

## 2015-03-14 DIAGNOSIS — M542 Cervicalgia: Secondary | ICD-10-CM | POA: Diagnosis not present

## 2015-03-14 DIAGNOSIS — M9903 Segmental and somatic dysfunction of lumbar region: Secondary | ICD-10-CM | POA: Diagnosis not present

## 2015-03-16 DIAGNOSIS — M9903 Segmental and somatic dysfunction of lumbar region: Secondary | ICD-10-CM | POA: Diagnosis not present

## 2015-03-16 DIAGNOSIS — M545 Low back pain: Secondary | ICD-10-CM | POA: Diagnosis not present

## 2015-03-16 DIAGNOSIS — M542 Cervicalgia: Secondary | ICD-10-CM | POA: Diagnosis not present

## 2015-03-16 DIAGNOSIS — M9901 Segmental and somatic dysfunction of cervical region: Secondary | ICD-10-CM | POA: Diagnosis not present

## 2015-04-06 ENCOUNTER — Ambulatory Visit (INDEPENDENT_AMBULATORY_CARE_PROVIDER_SITE_OTHER): Payer: Medicare Other | Admitting: *Deleted

## 2015-04-06 DIAGNOSIS — I480 Paroxysmal atrial fibrillation: Secondary | ICD-10-CM

## 2015-04-06 DIAGNOSIS — Z5181 Encounter for therapeutic drug level monitoring: Secondary | ICD-10-CM | POA: Diagnosis not present

## 2015-04-06 LAB — POCT INR: INR: 2.1

## 2015-05-13 ENCOUNTER — Other Ambulatory Visit: Payer: Self-pay | Admitting: Internal Medicine

## 2015-05-18 ENCOUNTER — Ambulatory Visit (INDEPENDENT_AMBULATORY_CARE_PROVIDER_SITE_OTHER): Payer: Medicare Other | Admitting: *Deleted

## 2015-05-18 DIAGNOSIS — I480 Paroxysmal atrial fibrillation: Secondary | ICD-10-CM

## 2015-05-18 DIAGNOSIS — Z5181 Encounter for therapeutic drug level monitoring: Secondary | ICD-10-CM | POA: Diagnosis not present

## 2015-05-18 LAB — POCT INR: INR: 2.8

## 2015-06-27 DIAGNOSIS — I10 Essential (primary) hypertension: Secondary | ICD-10-CM | POA: Diagnosis not present

## 2015-06-27 DIAGNOSIS — E559 Vitamin D deficiency, unspecified: Secondary | ICD-10-CM | POA: Diagnosis not present

## 2015-06-27 DIAGNOSIS — Z79899 Other long term (current) drug therapy: Secondary | ICD-10-CM | POA: Diagnosis not present

## 2015-06-27 DIAGNOSIS — R7309 Other abnormal glucose: Secondary | ICD-10-CM | POA: Diagnosis not present

## 2015-06-27 DIAGNOSIS — E039 Hypothyroidism, unspecified: Secondary | ICD-10-CM | POA: Diagnosis not present

## 2015-06-29 ENCOUNTER — Ambulatory Visit (INDEPENDENT_AMBULATORY_CARE_PROVIDER_SITE_OTHER): Payer: Medicare Other | Admitting: Pharmacist

## 2015-06-29 DIAGNOSIS — I480 Paroxysmal atrial fibrillation: Secondary | ICD-10-CM | POA: Diagnosis not present

## 2015-06-29 DIAGNOSIS — Z5181 Encounter for therapeutic drug level monitoring: Secondary | ICD-10-CM

## 2015-06-29 LAB — POCT INR: INR: 2.3

## 2015-07-03 DIAGNOSIS — E7800 Pure hypercholesterolemia, unspecified: Secondary | ICD-10-CM | POA: Diagnosis not present

## 2015-07-03 DIAGNOSIS — R7309 Other abnormal glucose: Secondary | ICD-10-CM | POA: Diagnosis not present

## 2015-07-03 DIAGNOSIS — I10 Essential (primary) hypertension: Secondary | ICD-10-CM | POA: Diagnosis not present

## 2015-07-03 DIAGNOSIS — E559 Vitamin D deficiency, unspecified: Secondary | ICD-10-CM | POA: Diagnosis not present

## 2015-07-04 ENCOUNTER — Other Ambulatory Visit: Payer: Self-pay | Admitting: Pharmacist

## 2015-08-10 ENCOUNTER — Ambulatory Visit (INDEPENDENT_AMBULATORY_CARE_PROVIDER_SITE_OTHER): Payer: Medicare Other | Admitting: *Deleted

## 2015-08-10 DIAGNOSIS — I480 Paroxysmal atrial fibrillation: Secondary | ICD-10-CM

## 2015-08-10 DIAGNOSIS — Z5181 Encounter for therapeutic drug level monitoring: Secondary | ICD-10-CM | POA: Diagnosis not present

## 2015-08-10 LAB — POCT INR: INR: 1.9

## 2015-09-14 ENCOUNTER — Ambulatory Visit (INDEPENDENT_AMBULATORY_CARE_PROVIDER_SITE_OTHER): Payer: Medicare Other | Admitting: Pharmacist

## 2015-09-14 DIAGNOSIS — I480 Paroxysmal atrial fibrillation: Secondary | ICD-10-CM

## 2015-09-14 DIAGNOSIS — Z5181 Encounter for therapeutic drug level monitoring: Secondary | ICD-10-CM | POA: Diagnosis not present

## 2015-09-14 LAB — POCT INR: INR: 2.6

## 2015-09-18 DIAGNOSIS — M9901 Segmental and somatic dysfunction of cervical region: Secondary | ICD-10-CM | POA: Diagnosis not present

## 2015-09-18 DIAGNOSIS — M545 Low back pain: Secondary | ICD-10-CM | POA: Diagnosis not present

## 2015-09-18 DIAGNOSIS — M542 Cervicalgia: Secondary | ICD-10-CM | POA: Diagnosis not present

## 2015-09-18 DIAGNOSIS — M9903 Segmental and somatic dysfunction of lumbar region: Secondary | ICD-10-CM | POA: Diagnosis not present

## 2015-10-06 ENCOUNTER — Other Ambulatory Visit: Payer: Self-pay | Admitting: Internal Medicine

## 2015-10-26 ENCOUNTER — Ambulatory Visit (INDEPENDENT_AMBULATORY_CARE_PROVIDER_SITE_OTHER): Payer: Medicare Other | Admitting: *Deleted

## 2015-10-26 DIAGNOSIS — I480 Paroxysmal atrial fibrillation: Secondary | ICD-10-CM | POA: Diagnosis not present

## 2015-10-26 DIAGNOSIS — Z5181 Encounter for therapeutic drug level monitoring: Secondary | ICD-10-CM | POA: Diagnosis not present

## 2015-10-26 LAB — POCT INR: INR: 2

## 2015-11-29 DIAGNOSIS — R7309 Other abnormal glucose: Secondary | ICD-10-CM | POA: Diagnosis not present

## 2015-11-29 DIAGNOSIS — E559 Vitamin D deficiency, unspecified: Secondary | ICD-10-CM | POA: Diagnosis not present

## 2015-11-29 DIAGNOSIS — I10 Essential (primary) hypertension: Secondary | ICD-10-CM | POA: Diagnosis not present

## 2015-11-29 DIAGNOSIS — E039 Hypothyroidism, unspecified: Secondary | ICD-10-CM | POA: Diagnosis not present

## 2015-12-05 DIAGNOSIS — E559 Vitamin D deficiency, unspecified: Secondary | ICD-10-CM | POA: Diagnosis not present

## 2015-12-05 DIAGNOSIS — I10 Essential (primary) hypertension: Secondary | ICD-10-CM | POA: Diagnosis not present

## 2015-12-05 DIAGNOSIS — R7309 Other abnormal glucose: Secondary | ICD-10-CM | POA: Diagnosis not present

## 2015-12-05 DIAGNOSIS — E039 Hypothyroidism, unspecified: Secondary | ICD-10-CM | POA: Diagnosis not present

## 2015-12-07 ENCOUNTER — Ambulatory Visit (INDEPENDENT_AMBULATORY_CARE_PROVIDER_SITE_OTHER): Payer: Medicare Other | Admitting: *Deleted

## 2015-12-07 DIAGNOSIS — Z5181 Encounter for therapeutic drug level monitoring: Secondary | ICD-10-CM

## 2015-12-07 DIAGNOSIS — I480 Paroxysmal atrial fibrillation: Secondary | ICD-10-CM

## 2015-12-07 LAB — POCT INR: INR: 1.7

## 2015-12-27 ENCOUNTER — Ambulatory Visit (INDEPENDENT_AMBULATORY_CARE_PROVIDER_SITE_OTHER): Payer: Medicare Other | Admitting: *Deleted

## 2015-12-27 DIAGNOSIS — Z5181 Encounter for therapeutic drug level monitoring: Secondary | ICD-10-CM

## 2015-12-27 DIAGNOSIS — I480 Paroxysmal atrial fibrillation: Secondary | ICD-10-CM | POA: Diagnosis not present

## 2015-12-27 LAB — POCT INR: INR: 1.8

## 2016-01-17 ENCOUNTER — Ambulatory Visit (INDEPENDENT_AMBULATORY_CARE_PROVIDER_SITE_OTHER): Payer: Medicare Other | Admitting: Pharmacist

## 2016-01-17 DIAGNOSIS — I480 Paroxysmal atrial fibrillation: Secondary | ICD-10-CM | POA: Diagnosis not present

## 2016-01-17 DIAGNOSIS — Z5181 Encounter for therapeutic drug level monitoring: Secondary | ICD-10-CM

## 2016-01-17 LAB — POCT INR: INR: 1.9

## 2016-02-01 ENCOUNTER — Ambulatory Visit (INDEPENDENT_AMBULATORY_CARE_PROVIDER_SITE_OTHER): Payer: Medicare Other | Admitting: Pharmacist

## 2016-02-01 DIAGNOSIS — I480 Paroxysmal atrial fibrillation: Secondary | ICD-10-CM

## 2016-02-01 DIAGNOSIS — Z5181 Encounter for therapeutic drug level monitoring: Secondary | ICD-10-CM

## 2016-02-01 LAB — POCT INR: INR: 2.5

## 2016-02-04 ENCOUNTER — Other Ambulatory Visit: Payer: Self-pay | Admitting: Internal Medicine

## 2016-02-22 ENCOUNTER — Ambulatory Visit (INDEPENDENT_AMBULATORY_CARE_PROVIDER_SITE_OTHER): Payer: Medicare Other | Admitting: Pharmacist

## 2016-02-22 DIAGNOSIS — Z5181 Encounter for therapeutic drug level monitoring: Secondary | ICD-10-CM

## 2016-02-22 DIAGNOSIS — M9901 Segmental and somatic dysfunction of cervical region: Secondary | ICD-10-CM | POA: Diagnosis not present

## 2016-02-22 DIAGNOSIS — M9903 Segmental and somatic dysfunction of lumbar region: Secondary | ICD-10-CM | POA: Diagnosis not present

## 2016-02-22 DIAGNOSIS — M542 Cervicalgia: Secondary | ICD-10-CM | POA: Diagnosis not present

## 2016-02-22 DIAGNOSIS — I480 Paroxysmal atrial fibrillation: Secondary | ICD-10-CM | POA: Diagnosis not present

## 2016-02-22 DIAGNOSIS — M545 Low back pain: Secondary | ICD-10-CM | POA: Diagnosis not present

## 2016-02-22 LAB — POCT INR: INR: 2.1

## 2016-02-29 DIAGNOSIS — M9901 Segmental and somatic dysfunction of cervical region: Secondary | ICD-10-CM | POA: Diagnosis not present

## 2016-02-29 DIAGNOSIS — E559 Vitamin D deficiency, unspecified: Secondary | ICD-10-CM | POA: Diagnosis not present

## 2016-02-29 DIAGNOSIS — M545 Low back pain: Secondary | ICD-10-CM | POA: Diagnosis not present

## 2016-02-29 DIAGNOSIS — M9903 Segmental and somatic dysfunction of lumbar region: Secondary | ICD-10-CM | POA: Diagnosis not present

## 2016-02-29 DIAGNOSIS — M542 Cervicalgia: Secondary | ICD-10-CM | POA: Diagnosis not present

## 2016-02-29 DIAGNOSIS — I10 Essential (primary) hypertension: Secondary | ICD-10-CM | POA: Diagnosis not present

## 2016-02-29 DIAGNOSIS — R7309 Other abnormal glucose: Secondary | ICD-10-CM | POA: Diagnosis not present

## 2016-02-29 DIAGNOSIS — E039 Hypothyroidism, unspecified: Secondary | ICD-10-CM | POA: Diagnosis not present

## 2016-03-06 DIAGNOSIS — R7309 Other abnormal glucose: Secondary | ICD-10-CM | POA: Diagnosis not present

## 2016-03-06 DIAGNOSIS — E039 Hypothyroidism, unspecified: Secondary | ICD-10-CM | POA: Diagnosis not present

## 2016-03-06 DIAGNOSIS — I10 Essential (primary) hypertension: Secondary | ICD-10-CM | POA: Diagnosis not present

## 2016-03-06 DIAGNOSIS — E559 Vitamin D deficiency, unspecified: Secondary | ICD-10-CM | POA: Diagnosis not present

## 2016-03-11 ENCOUNTER — Other Ambulatory Visit (HOSPITAL_COMMUNITY): Payer: Self-pay | Admitting: Internal Medicine

## 2016-03-11 DIAGNOSIS — R0989 Other specified symptoms and signs involving the circulatory and respiratory systems: Secondary | ICD-10-CM

## 2016-03-12 ENCOUNTER — Ambulatory Visit (HOSPITAL_COMMUNITY)
Admission: RE | Admit: 2016-03-12 | Discharge: 2016-03-12 | Disposition: A | Discharge status: Home / Self Care | Payer: Medicare Other | Source: Ambulatory Visit | Attending: Cardiovascular Disease | Admitting: Cardiovascular Disease

## 2016-03-12 DIAGNOSIS — E119 Type 2 diabetes mellitus without complications: Secondary | ICD-10-CM | POA: Diagnosis not present

## 2016-03-12 DIAGNOSIS — I10 Essential (primary) hypertension: Secondary | ICD-10-CM | POA: Diagnosis not present

## 2016-03-12 DIAGNOSIS — R0989 Other specified symptoms and signs involving the circulatory and respiratory systems: Secondary | ICD-10-CM | POA: Diagnosis not present

## 2016-03-12 DIAGNOSIS — E785 Hyperlipidemia, unspecified: Secondary | ICD-10-CM | POA: Diagnosis not present

## 2016-03-12 DIAGNOSIS — I6523 Occlusion and stenosis of bilateral carotid arteries: Secondary | ICD-10-CM | POA: Diagnosis not present

## 2016-03-20 ENCOUNTER — Ambulatory Visit (INDEPENDENT_AMBULATORY_CARE_PROVIDER_SITE_OTHER): Payer: Medicare Other | Admitting: *Deleted

## 2016-03-20 DIAGNOSIS — E559 Vitamin D deficiency, unspecified: Secondary | ICD-10-CM | POA: Diagnosis not present

## 2016-03-20 DIAGNOSIS — Z5181 Encounter for therapeutic drug level monitoring: Secondary | ICD-10-CM

## 2016-03-20 DIAGNOSIS — I10 Essential (primary) hypertension: Secondary | ICD-10-CM | POA: Diagnosis not present

## 2016-03-20 DIAGNOSIS — I480 Paroxysmal atrial fibrillation: Secondary | ICD-10-CM

## 2016-03-20 DIAGNOSIS — E039 Hypothyroidism, unspecified: Secondary | ICD-10-CM | POA: Diagnosis not present

## 2016-03-20 DIAGNOSIS — R7309 Other abnormal glucose: Secondary | ICD-10-CM | POA: Diagnosis not present

## 2016-03-20 LAB — POCT INR: INR: 2.2

## 2016-04-01 ENCOUNTER — Encounter: Payer: Self-pay | Admitting: Internal Medicine

## 2016-04-01 ENCOUNTER — Ambulatory Visit (INDEPENDENT_AMBULATORY_CARE_PROVIDER_SITE_OTHER): Payer: Medicare Other | Admitting: Internal Medicine

## 2016-04-01 VITALS — BP 122/78 | HR 95 | Ht 66.0 in | Wt 196.4 lb

## 2016-04-01 DIAGNOSIS — I10 Essential (primary) hypertension: Secondary | ICD-10-CM

## 2016-04-01 DIAGNOSIS — R5383 Other fatigue: Secondary | ICD-10-CM

## 2016-04-01 DIAGNOSIS — R0602 Shortness of breath: Secondary | ICD-10-CM

## 2016-04-01 DIAGNOSIS — R001 Bradycardia, unspecified: Secondary | ICD-10-CM | POA: Diagnosis not present

## 2016-04-01 DIAGNOSIS — I480 Paroxysmal atrial fibrillation: Secondary | ICD-10-CM

## 2016-04-01 NOTE — Patient Instructions (Addendum)
Medication Instructions:  Your physician recommends that you continue on your current medications as directed. Please refer to the Current Medication list given to you today.   Labwork: None ordered   Testing/Procedures:   Your physician has recommended that you wear an event monitor. Event monitors are medical devices that record the heart's electrical activity. Doctors most often [place] these monitors to diagnose arrhythmias. Arrhythmias are problems with the speed or rhythm of the heartbeat. The monitor is a small, portable device. You can wear one while you do your normal daily activities. This is usually used to diagnose what is causing palpitations/syncope (passing out).   Your physician has requested that you have an echocardiogram. Echocardiography is a painless test that uses sound waves to create images of your heart. It provides your doctor with information about the size and shape of your heart and how well your heart's chambers and valves are working. This procedure takes approximately one hour. There are no restrictions for this procedure.   Your physician has requested that you have en exercise stress myoview. For further information please visit [email or web address]. Please follow instruction sheet, as given.    Follow-Up: Your physician recommends that you schedule a follow-up appointment in: 6 weeks with Dr Rayann Heman   Any Other Special Instructions Will Be Listed Below (If Applicable).     If you need a refill on your cardiac medications before your next appointment, please call your pharmacy.

## 2016-04-03 ENCOUNTER — Encounter: Payer: Self-pay | Admitting: Internal Medicine

## 2016-04-03 NOTE — Progress Notes (Signed)
PCP:  Jani Gravel, MD  The patient presents today for routine electrophysiology followup.  She seems to be doing well.  She has not been compliant with EP follow-up.  I have not seen her since 2/16.  She has recently noticed symptoms of palpitations and dizziness.  She has not had frank syncope.  She has SOB with moderate activity.   Today, she denies symptoms of chest pain, orthopnea, PND,  or neurologic sequela.  The patient feels that she is tolerating medications without difficulties and is otherwise without complaint today.   Past Medical History:  Diagnosis Date  . Anemia 12/18/2011  . Atrial fibrillation ([place]) 04/13/2011  . Cancer [facility])    breast CA s/p Right mastectomy  . Diabetes mellitus, type 2 (HCC)    diet controlled  . Hx of echocardiogram    Echocardiogram (12/15): Moderate LVH, EF 55-60%, normal wall motion, grade 1 diastolic dysfunction, aortic sclerosis without stenosis, mild AI, MAC  . Hyperlipidemia   . Hypertension    medicine refractory  . Hypothyroidism    s/p thryroid gland ablation  . Neuromuscular disorder ([place])   . Osteopenia   . Renal insufficiency, mild 09/23/2013   Past Surgical History:  Procedure Laterality Date  . BREAST ENHANCEMENT SURGERY    . BREAST IMPLANT REMOVAL  2000   Unilaterally   . BREAST SURGERY    . CHOLECYSTECTOMY    . COLONOSCOPY  2002   Neg  . MASTECTOMY    . RAI  04/04/2006  . TUBAL LIGATION      Current Outpatient Prescriptions  Medication Sig Dispense Refill  . CALCIUM-MAGNESIUM-ZINC PO Take 1 capsule by mouth daily.    . Cholecalciferol (VITAMIN D-3) 1000 UNITS CAPS Take 1 capsule by mouth daily.    . citalopram (CELEXA) 20 MG tablet Take 20 mg by mouth daily.    . Coenzyme Q10 (CO Q 10 PO) Take 1 capsule by mouth daily.    . Flaxseed, Linseed, (FLAXSEED OIL PO) Take 1 tablet by mouth daily.    . furosemide (LASIX) 20 MG tablet Take 20 mg by mouth every other day. Can take daily as needed for swelling    . gabapentin  (NEURONTIN) 300 MG capsule Take 1 capsule by mouth at bedtime.    . hydrALAZINE (APRESOLINE) 100 MG tablet Take 150 mg by mouth 2 (two) times daily.    Marland Kitchen levothyroxine (SYNTHROID, LEVOTHROID) 112 MCG tablet TAKE 1 TABLET BY MOUTH DAILY EXCEPT 1/2 TABLET ON TUESDAYS, THURSDAYS, AND SATURDAYS    . metFORMIN (GLUCOPHAGE) 1000 MG tablet Take 500 mg by mouth 2 (two) times daily.    . Multiple Vitamins-Minerals (MULTIVITAMINS) CHEW Chew 1 tablet by mouth daily.    . Omega-3 Fatty Acids (FISH OIL PO) Take 1 capsule by mouth daily.    . pravastatin (PRAVACHOL) 80 MG tablet Take 80 mg by mouth daily.     . valsartan (DIOVAN) 160 MG tablet Take 160 mg by mouth daily.     . vitamin B-12 (CYANOCOBALAMIN) 1000 MCG tablet Take 1,000 mcg by mouth daily.    Marland Kitchen warfarin (COUMADIN) 5 MG tablet TAKE AS DIRECTED BY COUMADIN CLINIC 35 tablet 3   No current facility-administered medications for this visit.     Allergies  Allergen Reactions  . Lisinopril     ? Angioedema (NOT ARB candidate) Because of a history of documented adverse serious drug reaction;Medi Alert bracelet  is recommended  . Hctz [Hydrochlorothiazide]     hyponatremia  .  Spironolactone     REACTION: low potassium ???  . Penicillins Other (See Comments)    Pt states medication doesn't work for her d/t her usage of it several years ago.  . Amlodipine Besylate     REACTION: edema    Social History   Social History  . Marital status: Divorced    Spouse name: N/A  . Number of children: N/A  . Years of education: N/A   Occupational History  . Not on file.   Social History Main Topics  . Smoking status: Never Smoker  . Smokeless tobacco: Never Used  . Alcohol use No  . Drug use: No  . Sexual activity: No   Other Topics Concern  . Not on file   Social History Narrative  . No narrative on file    Family History  Problem Relation Age of Onset  . Depression Mother   . Alcohol abuse Father   . Heart attack Brother 61  .  Cancer Other     Uncle, not specific  . Cancer Other     Aunt, not specific. GYN CA  . Breast cancer Other     Aunt, not specific  . Diabetes Other     Grandmother, not specific  . Cancer Maternal Grandfather     colon     Physical Exam: Vitals:   04/01/16 1626  BP: 122/78  Pulse: 95  Weight: 196 lb 6.4 oz (89.1 kg)  Height: 5\' 6"  (1.676 m)    GEN- The patient is well appearing, alert and oriented x 3 today.   Head- normocephalic, atraumatic Eyes-  Sclera clear, conjunctiva pink Ears- hearing intact Oropharynx- clear Neck- supple, no JVP Lymph- no cervical lymphadenopathy Lungs- Clear to ausculation bilaterally, normal work of breathing Heart- Regular rate and rhythm, no murmurs, rubs or gallops, PMI not laterally displaced GI- soft, NT, ND, + BS Extremities- no clubbing, cyanosis, +1 chronic L leg edema Neuro- strength and sensation are intact  ekg today reveals sinus rhythm with first degree AV block..Otherwise normal ekg.  Assessment and Plan:4  1. Dizziness, palpitations, SOB Unclear etiology Given prior AF, will place 30 day monitor to evaluate for an arrhythmogenic cause Echo to evaluate for structural heart changes myoview to evaluate SOB  2. PAF Monitor as above No changes Continue warfarin  3. HTN Stable No change required today  4. LLE (chronic) Improved with TED hose and diuretic Continue with exercise, weight loss efforts.  F/u in 6 weeks on above testing and symptoms    Thompson Grayer, MD

## 2016-04-10 ENCOUNTER — Ambulatory Visit (INDEPENDENT_AMBULATORY_CARE_PROVIDER_SITE_OTHER): Payer: Medicare Other

## 2016-04-10 DIAGNOSIS — I480 Paroxysmal atrial fibrillation: Secondary | ICD-10-CM | POA: Diagnosis not present

## 2016-04-23 ENCOUNTER — Telehealth (HOSPITAL_COMMUNITY): Payer: Self-pay | Admitting: *Deleted

## 2016-04-23 NOTE — Telephone Encounter (Signed)
Patient given detailed instructions per Myocardial Perfusion Study Information Sheet for the test on 04/25/16 at 1000. Patient notified to arrive 15 minutes early and that it is imperative to arrive on time for appointment to keep from having the test rescheduled.  If you need to cancel or reschedule your appointment, please call the office within 24 hours of your appointment. Failure to do so may result in a cancellation of your appointment, and a $50 no show fee. Patient verbalized understanding.Anai Lipson, Ranae [place]ms

## 2016-04-25 ENCOUNTER — Other Ambulatory Visit: Payer: Self-pay

## 2016-04-25 ENCOUNTER — Ambulatory Visit (HOSPITAL_BASED_OUTPATIENT_CLINIC_OR_DEPARTMENT_OTHER): Payer: Medicare Other

## 2016-04-25 ENCOUNTER — Ambulatory Visit (HOSPITAL_COMMUNITY): Payer: Medicare Other | Attending: Cardiology

## 2016-04-25 DIAGNOSIS — I10 Essential (primary) hypertension: Secondary | ICD-10-CM | POA: Insufficient documentation

## 2016-04-25 DIAGNOSIS — I4891 Unspecified atrial fibrillation: Secondary | ICD-10-CM | POA: Diagnosis not present

## 2016-04-25 DIAGNOSIS — I272 Pulmonary hypertension, unspecified: Secondary | ICD-10-CM | POA: Diagnosis not present

## 2016-04-25 DIAGNOSIS — E119 Type 2 diabetes mellitus without complications: Secondary | ICD-10-CM | POA: Diagnosis not present

## 2016-04-25 DIAGNOSIS — N289 Disorder of kidney and ureter, unspecified: Secondary | ICD-10-CM | POA: Insufficient documentation

## 2016-04-25 DIAGNOSIS — I510 Cardiac septal defect, acquired: Secondary | ICD-10-CM | POA: Insufficient documentation

## 2016-04-25 DIAGNOSIS — Z6831 Body mass index (BMI) 31.0-31.9, adult: Secondary | ICD-10-CM | POA: Insufficient documentation

## 2016-04-25 DIAGNOSIS — E785 Hyperlipidemia, unspecified: Secondary | ICD-10-CM | POA: Insufficient documentation

## 2016-04-25 DIAGNOSIS — R42 Dizziness and giddiness: Secondary | ICD-10-CM | POA: Diagnosis not present

## 2016-04-25 DIAGNOSIS — I313 Pericardial effusion (noninflammatory): Secondary | ICD-10-CM | POA: Insufficient documentation

## 2016-04-25 DIAGNOSIS — R5383 Other fatigue: Secondary | ICD-10-CM

## 2016-04-25 DIAGNOSIS — Z853 Personal history of malignant neoplasm of breast: Secondary | ICD-10-CM | POA: Diagnosis not present

## 2016-04-25 DIAGNOSIS — R0602 Shortness of breath: Secondary | ICD-10-CM

## 2016-04-25 DIAGNOSIS — I351 Nonrheumatic aortic (valve) insufficiency: Secondary | ICD-10-CM | POA: Diagnosis not present

## 2016-04-25 LAB — MYOCARDIAL PERFUSION IMAGING
CHL CUP MPHR: 146 {beats}/min
CHL CUP NUCLEAR SDS: 1
CSEPPHR: 148 {beats}/min
Estimated workload: 7 METS
Exercise duration (min): 5 min
Exercise duration (sec): 30 s
LHR: 0.34
LVDIAVOL: 133 mL (ref 46–106)
LVSYSVOL: 62 mL
NUC STRESS TID: 0.98
Percent HR: 101 %
RPE: 19
Rest HR: 46 {beats}/min
SRS: 2
SSS: 3

## 2016-04-25 MED ORDER — TECHNETIUM TC 99M TETROFOSMIN IV KIT
10.4000 | PACK | Freq: Once | INTRAVENOUS | Status: AC | PRN
  Administered 2016-04-25: 10.4 via INTRAVENOUS
  Filled 2016-04-25: qty 11

## 2016-04-25 MED ORDER — TECHNETIUM TC 99M TETROFOSMIN IV KIT
31.6000 | PACK | Freq: Once | INTRAVENOUS | Status: AC | PRN
  Administered 2016-04-25: 31.6 via INTRAVENOUS
  Filled 2016-04-25: qty 32

## 2016-05-01 ENCOUNTER — Ambulatory Visit (INDEPENDENT_AMBULATORY_CARE_PROVIDER_SITE_OTHER): Payer: Medicare Other | Admitting: Pharmacist

## 2016-05-01 DIAGNOSIS — I480 Paroxysmal atrial fibrillation: Secondary | ICD-10-CM

## 2016-05-01 DIAGNOSIS — Z5181 Encounter for therapeutic drug level monitoring: Secondary | ICD-10-CM | POA: Diagnosis not present

## 2016-05-01 DIAGNOSIS — M542 Cervicalgia: Secondary | ICD-10-CM | POA: Diagnosis not present

## 2016-05-01 DIAGNOSIS — M9903 Segmental and somatic dysfunction of lumbar region: Secondary | ICD-10-CM | POA: Diagnosis not present

## 2016-05-01 DIAGNOSIS — M545 Low back pain: Secondary | ICD-10-CM | POA: Diagnosis not present

## 2016-05-01 DIAGNOSIS — M9901 Segmental and somatic dysfunction of cervical region: Secondary | ICD-10-CM | POA: Diagnosis not present

## 2016-05-01 LAB — POCT INR: INR: 2.8

## 2016-05-15 ENCOUNTER — Ambulatory Visit (INDEPENDENT_AMBULATORY_CARE_PROVIDER_SITE_OTHER): Payer: Medicare Other | Admitting: Internal Medicine

## 2016-05-15 ENCOUNTER — Encounter: Payer: Self-pay | Admitting: Internal Medicine

## 2016-05-15 VITALS — BP 142/80 | HR 89 | Ht 67.0 in | Wt 196.4 lb

## 2016-05-15 DIAGNOSIS — I480 Paroxysmal atrial fibrillation: Secondary | ICD-10-CM

## 2016-05-15 DIAGNOSIS — R42 Dizziness and giddiness: Secondary | ICD-10-CM | POA: Diagnosis not present

## 2016-05-15 NOTE — Patient Instructions (Addendum)
Medication Instructions:  Your physician has recommended you make the following change in your medication:  1) Stop Furosemide   Labwork: None ordered   Testing/Procedures: None ordered   Follow-Up: Your physician recommends that you schedule a follow-up appointment in: 6 weeks with Tommye Standard, PA   Increase fluids     If you need a refill on your cardiac medications before your next appointment, please call your pharmacy.

## 2016-05-15 NOTE — Progress Notes (Signed)
PCP:  Jani Gravel, MD  The patient presents today for routine electrophysiology followup.  She seems to be doing well.  She continues to have episodes of presyncope.  These are typically upon standing.   Today, she denies symptoms of chest pain, orthopnea, PND,  or neurologic sequela.  The patient feels that she is tolerating medications without difficulties and is otherwise without complaint today.   Past Medical History:  Diagnosis Date  . Anemia 12/18/2011  . Atrial fibrillation ([place]) 04/13/2011  . Cancer [facility])    breast CA s/p Right mastectomy  . Diabetes mellitus, type 2 (HCC)    diet controlled  . Hx of echocardiogram    Echocardiogram (12/15): Moderate LVH, EF 55-60%, normal wall motion, grade 1 diastolic dysfunction, aortic sclerosis without stenosis, mild AI, MAC  . Hyperlipidemia   . Hypertension    medicine refractory  . Hypothyroidism    s/p thryroid gland ablation  . Neuromuscular disorder ([place])   . Osteopenia   . Renal insufficiency, mild 09/23/2013   Past Surgical History:  Procedure Laterality Date  . BREAST ENHANCEMENT SURGERY    . BREAST IMPLANT REMOVAL  2000   Unilaterally   . BREAST SURGERY    . CHOLECYSTECTOMY    . COLONOSCOPY  2002   Neg  . MASTECTOMY    . RAI  04/04/2006  . TUBAL LIGATION      Current Outpatient Prescriptions  Medication Sig Dispense Refill  . CALCIUM-MAGNESIUM-ZINC PO Take 1 capsule by mouth daily.    . Cholecalciferol (VITAMIN D-3) 1000 UNITS CAPS Take 1 capsule by mouth daily.    . citalopram (CELEXA) 20 MG tablet Take 20 mg by mouth daily.    . Coenzyme Q10 (CO Q 10 PO) Take 1 capsule by mouth daily.    . Flaxseed, Linseed, (FLAXSEED OIL PO) Take 1 tablet by mouth daily.    . furosemide (LASIX) 20 MG tablet Take 20 mg by mouth every other day. Can take daily as needed for swelling    . gabapentin (NEURONTIN) 300 MG capsule Take 1 capsule by mouth at bedtime.    . hydrALAZINE (APRESOLINE) 100 MG tablet Take 150 mg by mouth 2  (two) times daily.    Marland Kitchen levothyroxine (SYNTHROID, LEVOTHROID) 112 MCG tablet Take 1 tablet (112 mcg) by mouth daily. EXCEPT Tuesdays and Thursdays take 1/2 tablet (56 mcg) by mouth daily.    . metFORMIN (GLUCOPHAGE) 1000 MG tablet Take 500 mg by mouth 2 (two) times daily.    . Multiple Vitamins-Minerals (MULTIVITAMINS) CHEW Chew 1 tablet by mouth daily.    . Omega-3 Fatty Acids (FISH OIL PO) Take 1 capsule by mouth daily.    . pravastatin (PRAVACHOL) 80 MG tablet Take 80 mg by mouth daily.     . valsartan (DIOVAN) 160 MG tablet Take 160 mg by mouth daily.     . vitamin B-12 (CYANOCOBALAMIN) 1000 MCG tablet Take 1,000 mcg by mouth daily.    Marland Kitchen warfarin (COUMADIN) 5 MG tablet TAKE AS DIRECTED BY COUMADIN CLINIC 35 tablet 3   No current facility-administered medications for this visit.     Allergies  Allergen Reactions  . Lisinopril     ? Angioedema (NOT ARB candidate) Because of a history of documented adverse serious drug reaction;Medi Alert bracelet  is recommended  . Hctz [Hydrochlorothiazide]     hyponatremia  . Spironolactone     REACTION: low potassium ???  . Penicillins Other (See Comments)    Pt states medication  doesn't work for her d/t her usage of it several years ago.  . Amlodipine Besylate     REACTION: edema    Social History   Social History  . Marital status: Divorced    Spouse name: N/A  . Number of children: N/A  . Years of education: N/A   Occupational History  . Not on file.   Social History Main Topics  . Smoking status: Never Smoker  . Smokeless tobacco: Never Used  . Alcohol use No  . Drug use: No  . Sexual activity: No   Other Topics Concern  . Not on file   Social History Narrative  . No narrative on file    Family History  Problem Relation Age of Onset  . Depression Mother   . Alcohol abuse Father   . Heart attack Brother 6  . Cancer Other     Uncle, not specific  . Cancer Other     Aunt, not specific. GYN CA  . Breast cancer Other      Aunt, not specific  . Diabetes Other     Grandmother, not specific  . Cancer Maternal Grandfather     colon     Physical Exam: Vitals:   05/15/16 1358  BP: (!) 142/80  BP Location: Left Arm  Pulse: 89  Weight: 196 lb 6.4 oz (89.1 kg)  Height: 5\' 7"  (1.702 m)    GEN- The patient is well appearing, alert and oriented x 3 today.   Head- normocephalic, atraumatic Eyes-  Sclera clear, conjunctiva pink Ears- hearing intact Oropharynx- clear Neck- supple, no JVP Lymph- no cervical lymphadenopathy Lungs- Clear to ausculation bilaterally, normal work of breathing Heart- Regular rate and rhythm, no murmurs, rubs or gallops, PMI not laterally displaced GI- soft, NT, ND, + BS Extremities- no clubbing, cyanosis, +1 chronic L leg edema Neuro- strength and sensation are intact  ekg today reveals sinus rhythm with first degree AV block..Otherwise normal ekg. Echo, myoview and event monitor is reviewed today  Assessment and Plan:4  1. Dizziness, palpitations, SOB Orthostatic today Increase oral hydration Stop lasix  2. PAF No AF on recent monitor Continue warfarin  3. HTN Stable No change required today  4. LLE (chronic) Improved with TED hose  Stop lasix as above Continue with exercise, weight loss efforts.  5. Sinus bradycardia Today, her heart rates are 80s and she still has dizziness.  I am not convinced that she is symptomatic with bradycardia.  No indication for pacing currently.  F/u in 6 weeks with EP PA    Thompson Grayer, MD

## 2016-06-04 DIAGNOSIS — E039 Hypothyroidism, unspecified: Secondary | ICD-10-CM | POA: Diagnosis not present

## 2016-06-04 DIAGNOSIS — E559 Vitamin D deficiency, unspecified: Secondary | ICD-10-CM | POA: Diagnosis not present

## 2016-06-04 DIAGNOSIS — I10 Essential (primary) hypertension: Secondary | ICD-10-CM | POA: Diagnosis not present

## 2016-06-04 DIAGNOSIS — R7309 Other abnormal glucose: Secondary | ICD-10-CM | POA: Diagnosis not present

## 2016-06-11 DIAGNOSIS — I480 Paroxysmal atrial fibrillation: Secondary | ICD-10-CM | POA: Diagnosis not present

## 2016-06-11 DIAGNOSIS — I10 Essential (primary) hypertension: Secondary | ICD-10-CM | POA: Diagnosis not present

## 2016-06-11 DIAGNOSIS — E559 Vitamin D deficiency, unspecified: Secondary | ICD-10-CM | POA: Diagnosis not present

## 2016-06-11 DIAGNOSIS — E039 Hypothyroidism, unspecified: Secondary | ICD-10-CM | POA: Diagnosis not present

## 2016-06-12 ENCOUNTER — Ambulatory Visit (INDEPENDENT_AMBULATORY_CARE_PROVIDER_SITE_OTHER): Payer: Medicare Other | Admitting: *Deleted

## 2016-06-12 DIAGNOSIS — I480 Paroxysmal atrial fibrillation: Secondary | ICD-10-CM | POA: Diagnosis not present

## 2016-06-12 DIAGNOSIS — Z5181 Encounter for therapeutic drug level monitoring: Secondary | ICD-10-CM

## 2016-06-12 LAB — POCT INR: INR: 2.9

## 2016-06-17 ENCOUNTER — Other Ambulatory Visit: Payer: Self-pay | Admitting: Internal Medicine

## 2016-06-28 ENCOUNTER — Encounter: Payer: Self-pay | Admitting: Physician Assistant

## 2016-06-30 NOTE — Progress Notes (Signed)
Cardiology Office Note Date:  07/01/2016  Patient ID:  Gloria Joseph, Gloria Joseph 06-27-1940, MRN [id number] PCP:  Jani Gravel, MD  Cardiologist:  Dr. Rayann Heman    Chief Complaint: planned f/u  History of Present Illness: Gloria Joseph is a 76 y.o. female with history of paroxysmal AFib, HTN, chronic LE edema (ted hose and weight loss advised), dizziness, bradycardia, hypothyroid, DM, HLD  The patient comes today to be seen for Dr. Rayann Heman, last seen by him December, she had on goig c/o dizziness, near syncope, generally observed to be upon standing, noted to be orthostatic with HR 80's, her lasix was stopped and not convinced that she was symptomatic from a bradycardia standpoint. Given she had c/o with normal HR at that visit.  Her lasix was stopped and recommended support stockings weight loss for her LE edema with plans to follow up on symptoms, BP.  She feels "[place]!"  Her symptoms have completely resolved, no dizziness, near syncope or syncope,  Her energy is back to normal and she says she really feels fantastic, no CP, palpitations or SOB.  She is actively losing weight.  She reports her BP always a little high at the doctor's office but 2 weeks ago her PMD increased her [place] for high BP there, she follows up next week there.  She in general though says her BP has been about 140 "on top".  She denies any bleeding or signs of bleeding.  Past Medical History:  Diagnosis Date  . Anemia 12/18/2011  . Atrial fibrillation ([place]) 04/13/2011  . Cancer [facility])    breast CA s/p Right mastectomy  . Diabetes mellitus, type 2 (HCC)    diet controlled  . Hx of echocardiogram    Echocardiogram (12/15): Moderate LVH, EF 55-60%, normal wall motion, grade 1 diastolic dysfunction, aortic sclerosis without stenosis, mild AI, MAC  . Hyperlipidemia   . Hypertension    medicine refractory  . Hypothyroidism    s/p thryroid gland ablation  . Neuromuscular disorder ([place])   . Osteopenia   . Renal  insufficiency, mild 09/23/2013    Past Surgical History:  Procedure Laterality Date  . BREAST ENHANCEMENT SURGERY    . BREAST IMPLANT REMOVAL  2000   Unilaterally   . BREAST SURGERY    . CHOLECYSTECTOMY    . COLONOSCOPY  2002   Neg  . MASTECTOMY    . RAI  04/04/2006  . TUBAL LIGATION      Current Outpatient Prescriptions  Medication Sig Dispense Refill  . CALCIUM-MAGNESIUM-ZINC PO Take 1 capsule by mouth daily.    . Cholecalciferol (VITAMIN D-3) 1000 UNITS CAPS Take 1 capsule by mouth daily.    . citalopram (CELEXA) 20 MG tablet Take 20 mg by mouth daily.    . Coenzyme Q10 (CO Q 10 PO) Take 1 capsule by mouth daily.    . Flaxseed, Linseed, (FLAXSEED OIL PO) Take 1 tablet by mouth daily.    Marland Kitchen gabapentin (NEURONTIN) 300 MG capsule Take 1 capsule by mouth at bedtime.    . hydrALAZINE (APRESOLINE) 100 MG tablet Take 150 mg by mouth 2 (two) times daily.    Marland Kitchen levothyroxine (SYNTHROID, LEVOTHROID) 112 MCG tablet Take 1 tablet (112 mcg) by mouth daily. EXCEPT Tuesdays and Thursdays take 1/2 tablet (56 mcg) by mouth daily.    . metFORMIN (GLUCOPHAGE) 1000 MG tablet Take 500 mg by mouth 2 (two) times daily.    . Multiple Vitamins-Minerals (MULTIVITAMINS) CHEW Chew 1 tablet by mouth daily.    Marland Kitchen  Omega-3 Fatty Acids (FISH OIL PO) Take 1 capsule by mouth daily.    . pravastatin (PRAVACHOL) 80 MG tablet Take 80 mg by mouth daily.     . valsartan (DIOVAN) 320 MG tablet Take 320 mg by mouth daily.     . vitamin B-12 (CYANOCOBALAMIN) 1000 MCG tablet Take 1,000 mcg by mouth daily.    Marland Kitchen warfarin (COUMADIN) 5 MG tablet TAKE AS DIRECTED BY COUMADIN CLINIC 35 tablet 4   No current facility-administered medications for this visit.     Allergies:   Lisinopril; Hctz [hydrochlorothiazide]; Spironolactone; Penicillins; and Amlodipine besylate   Social History:  The patient  reports that she has never smoked. She has never used smokeless tobacco. She reports that she does not drink alcohol or use drugs.    Family History:  The patient's family history includes Alcohol abuse in her father; Breast cancer in her other; Cancer in her maternal grandfather, other, and other; Depression in her mother; Diabetes in her other; Heart attack (age of onset: 82) in her brother.  ROS:  Please see the history of present illness.    All other systems are reviewed and otherwise negative.   PHYSICAL EXAM:  VS:  BP (!) 164/82   Pulse 78   Ht _0  (1.702 m)   Wt 195 lb (88.5 kg)   BMI 30.54 kg/m  BMI: Body mass index is 30.54 kg/m. Well nourished, well developed, in no acute distress  HEENT: normocephalic, atraumatic  Neck: no JVD, carotid bruits or masses Cardiac:  RRR; no significant murmurs, no rubs, or gallops Lungs:  CTA b/l,  no wheezing, rhonchi or rales  Abd: soft, nontender MS: no deformity or atrophy Ext: no edema is appreciated today  Skin: warm and dry, no rash Neuro:  No gross deficits appreciated Psych: euthymic mood, full affect   EKG:  Done 05/15/16 SR, 1st degree AVB  04/25/16: TTE Study Conclusions - Left ventricle: The cavity size was normal. Wall thickness was   increased in a pattern of moderate LVH. Systolic function was   normal. The estimated ejection fraction was in the range of 55%   to 60%. Wall motion was normal; there were no regional wall   motion abnormalities. Features are consistent with a pseudonormal   left ventricular filling pattern, with concomitant abnormal   relaxation and increased filling pressure (grade 2 diastolic   dysfunction). - Aortic valve: There was no stenosis. There was mild   regurgitation. - Left atrium: The atrium was mildly dilated. - Right ventricle: The cavity size was normal. Systolic function   was normal. - Tricuspid valve: Peak RV-RA gradient (S): 44 mm Hg. - Pulmonary arteries: PA peak pressure: 47 mm Hg (S). - Inferior vena cava: The vessel was normal in size. The   respirophasic diameter changes were in the normal range (=  50%),   consistent with normal central venous pressure. - Pericardium, extracardiac: A trivial pericardial effusion was   identified. Impressions: - Normal LV size with moderate LV hypertrophy. EF 55-60%. Moderate   diastolic dysfunction. Normal RV size and systolic function. Mild   pulmonary hypertension. Mild aortic insufficiency.  04/25/16: stress myoview Exercise stress, reached 101%mphr Study Highlights   Nuclear stress EF: 53%.  Blood pressure demonstrated a normal response to exercise.  EKG uninterpretable due exercise due to significant baseline artifact. Immediate recovery showe < 23m of J point depression with upsloping ST segment depression in the inferolateral leads.  There is a small defect of mild severity  present in the apical lateral and apex location. The defect is non-reversible. This likely represents apical thinning. No ischemia noted.  This is a low risk study.  The left ventricular ejection fraction is mildly decreased (45-54%).  PVC's, PAC's and ventricular couplets noted during stress.    04/10/16: Event monitor Study Highlights  Predominant rhythm is sinus bradycardia. Rare premature atrial contractions No pauses or AV block No arrhythmias  Average HR was 52bpm   Recent Labs: No results found for requested labs within last 8760 hours.  No results found for requested labs within last 8760 hours.   CrCl cannot be calculated (Patient's most recent lab result is older than the maximum 21 days allowed.).   Wt Readings from Last 3 Encounters:  07/01/16 195 lb (88.5 kg)  05/15/16 196 lb 6.4 oz (89.1 kg)  04/25/16 196 lb (88.9 kg)     Other studies reviewed: Additional studies/records reviewed today include: summarized above  ASSESSMENT AND PLAN:  1. Paroxysmal AFib     CHA2DS2Vasc is 5, on Warfarin, monitored and managed with coumadin clinic     She denies any palpitations or symptoms  2. Dizziness/palpitations, SOB     Completely resolved  off the lasix  3. HTN     Being addressed with her PMD       Disposition: F/u with Dr. Rayann Heman in a year, she tells me she sees her PMD every 3 months with labs every 6 months, we will see her sooner if needed.  Current medicines are reviewed at length with the patient today.  The patient did not have any concerns regarding medicines.  Haywood Lasso, PA-C 07/01/2016 1:30 PM     [address] [place] [address] [phone] (office)  [phone] (fax)

## 2016-07-01 ENCOUNTER — Ambulatory Visit (INDEPENDENT_AMBULATORY_CARE_PROVIDER_SITE_OTHER): Payer: Medicare Other | Admitting: Physician Assistant

## 2016-07-01 VITALS — BP 164/82 | HR 78 | Ht 67.0 in | Wt 195.0 lb

## 2016-07-01 DIAGNOSIS — I480 Paroxysmal atrial fibrillation: Secondary | ICD-10-CM

## 2016-07-01 DIAGNOSIS — R42 Dizziness and giddiness: Secondary | ICD-10-CM | POA: Diagnosis not present

## 2016-07-01 DIAGNOSIS — I10 Essential (primary) hypertension: Secondary | ICD-10-CM | POA: Diagnosis not present

## 2016-07-01 NOTE — Patient Instructions (Addendum)
Medication Instructions:   Your physician recommends that you continue on your current medications as directed. Please refer to the Current Medication list given to you today.   If you need a refill on your cardiac medications before your next appointment, please call your pharmacy.  Labwork: NONE ORDERED  TODAY    Testing/Procedures: NONE ORDERED  TODAY    Follow-Up:  Your physician wants you to follow-up in: ONE YEAR WITH ALLRED You will receive a reminder letter in the mail two months in advance. If you don't receive a letter, please call our office to schedule the follow-up appointment.      Any Other Special Instructions Will Be Listed Below (If Applicable).                                                                                                                                                   

## 2016-07-24 ENCOUNTER — Ambulatory Visit (INDEPENDENT_AMBULATORY_CARE_PROVIDER_SITE_OTHER): Payer: Medicare Other | Admitting: *Deleted

## 2016-07-24 DIAGNOSIS — Z5181 Encounter for therapeutic drug level monitoring: Secondary | ICD-10-CM | POA: Diagnosis not present

## 2016-07-24 DIAGNOSIS — I480 Paroxysmal atrial fibrillation: Secondary | ICD-10-CM | POA: Diagnosis not present

## 2016-07-24 LAB — POCT INR: INR: 2

## 2016-09-04 ENCOUNTER — Ambulatory Visit (INDEPENDENT_AMBULATORY_CARE_PROVIDER_SITE_OTHER): Payer: Medicare Other | Admitting: *Deleted

## 2016-09-04 DIAGNOSIS — I480 Paroxysmal atrial fibrillation: Secondary | ICD-10-CM

## 2016-09-04 DIAGNOSIS — Z5181 Encounter for therapeutic drug level monitoring: Secondary | ICD-10-CM

## 2016-09-04 LAB — POCT INR: INR: 2.4

## 2016-10-16 ENCOUNTER — Ambulatory Visit (INDEPENDENT_AMBULATORY_CARE_PROVIDER_SITE_OTHER): Payer: Medicare Other | Admitting: *Deleted

## 2016-10-16 DIAGNOSIS — I480 Paroxysmal atrial fibrillation: Secondary | ICD-10-CM | POA: Diagnosis not present

## 2016-10-16 DIAGNOSIS — Z5181 Encounter for therapeutic drug level monitoring: Secondary | ICD-10-CM

## 2016-10-16 LAB — POCT INR: INR: 2.7

## 2016-11-27 ENCOUNTER — Ambulatory Visit (INDEPENDENT_AMBULATORY_CARE_PROVIDER_SITE_OTHER): Payer: Medicare Other | Admitting: *Deleted

## 2016-11-27 DIAGNOSIS — I480 Paroxysmal atrial fibrillation: Secondary | ICD-10-CM | POA: Diagnosis not present

## 2016-11-27 DIAGNOSIS — Z5181 Encounter for therapeutic drug level monitoring: Secondary | ICD-10-CM | POA: Diagnosis not present

## 2016-11-27 LAB — POCT INR: INR: 2

## 2016-11-30 ENCOUNTER — Other Ambulatory Visit: Payer: Self-pay | Admitting: Internal Medicine

## 2016-12-02 DIAGNOSIS — I10 Essential (primary) hypertension: Secondary | ICD-10-CM | POA: Diagnosis not present

## 2016-12-02 DIAGNOSIS — G629 Polyneuropathy, unspecified: Secondary | ICD-10-CM | POA: Diagnosis not present

## 2016-12-02 DIAGNOSIS — E1121 Type 2 diabetes mellitus with diabetic nephropathy: Secondary | ICD-10-CM | POA: Diagnosis not present

## 2016-12-02 DIAGNOSIS — D638 Anemia in other chronic diseases classified elsewhere: Secondary | ICD-10-CM | POA: Diagnosis not present

## 2016-12-02 DIAGNOSIS — E039 Hypothyroidism, unspecified: Secondary | ICD-10-CM | POA: Diagnosis not present

## 2016-12-02 DIAGNOSIS — Z79899 Other long term (current) drug therapy: Secondary | ICD-10-CM | POA: Diagnosis not present

## 2016-12-02 DIAGNOSIS — D649 Anemia, unspecified: Secondary | ICD-10-CM | POA: Diagnosis not present

## 2016-12-02 DIAGNOSIS — R7309 Other abnormal glucose: Secondary | ICD-10-CM | POA: Diagnosis not present

## 2016-12-07 ENCOUNTER — Other Ambulatory Visit: Payer: Self-pay | Admitting: Internal Medicine

## 2016-12-09 DIAGNOSIS — R7309 Other abnormal glucose: Secondary | ICD-10-CM | POA: Diagnosis not present

## 2016-12-09 DIAGNOSIS — E7800 Pure hypercholesterolemia, unspecified: Secondary | ICD-10-CM | POA: Diagnosis not present

## 2016-12-09 DIAGNOSIS — E039 Hypothyroidism, unspecified: Secondary | ICD-10-CM | POA: Diagnosis not present

## 2016-12-09 DIAGNOSIS — I10 Essential (primary) hypertension: Secondary | ICD-10-CM | POA: Diagnosis not present

## 2016-12-18 DIAGNOSIS — M9903 Segmental and somatic dysfunction of lumbar region: Secondary | ICD-10-CM | POA: Diagnosis not present

## 2016-12-18 DIAGNOSIS — M9901 Segmental and somatic dysfunction of cervical region: Secondary | ICD-10-CM | POA: Diagnosis not present

## 2016-12-18 DIAGNOSIS — M545 Low back pain: Secondary | ICD-10-CM | POA: Diagnosis not present

## 2016-12-18 DIAGNOSIS — M542 Cervicalgia: Secondary | ICD-10-CM | POA: Diagnosis not present

## 2016-12-24 DIAGNOSIS — M542 Cervicalgia: Secondary | ICD-10-CM | POA: Diagnosis not present

## 2016-12-24 DIAGNOSIS — M9903 Segmental and somatic dysfunction of lumbar region: Secondary | ICD-10-CM | POA: Diagnosis not present

## 2016-12-24 DIAGNOSIS — M545 Low back pain: Secondary | ICD-10-CM | POA: Diagnosis not present

## 2016-12-24 DIAGNOSIS — M9901 Segmental and somatic dysfunction of cervical region: Secondary | ICD-10-CM | POA: Diagnosis not present

## 2017-01-07 DIAGNOSIS — M542 Cervicalgia: Secondary | ICD-10-CM | POA: Diagnosis not present

## 2017-01-07 DIAGNOSIS — M9901 Segmental and somatic dysfunction of cervical region: Secondary | ICD-10-CM | POA: Diagnosis not present

## 2017-01-07 DIAGNOSIS — M545 Low back pain: Secondary | ICD-10-CM | POA: Diagnosis not present

## 2017-01-07 DIAGNOSIS — M9903 Segmental and somatic dysfunction of lumbar region: Secondary | ICD-10-CM | POA: Diagnosis not present

## 2017-01-08 ENCOUNTER — Ambulatory Visit (INDEPENDENT_AMBULATORY_CARE_PROVIDER_SITE_OTHER): Payer: Medicare Other | Admitting: *Deleted

## 2017-01-08 DIAGNOSIS — I480 Paroxysmal atrial fibrillation: Secondary | ICD-10-CM

## 2017-01-08 DIAGNOSIS — Z5181 Encounter for therapeutic drug level monitoring: Secondary | ICD-10-CM

## 2017-01-08 LAB — POCT INR: INR: 3.8

## 2017-01-09 DIAGNOSIS — M9901 Segmental and somatic dysfunction of cervical region: Secondary | ICD-10-CM | POA: Diagnosis not present

## 2017-01-09 DIAGNOSIS — M9903 Segmental and somatic dysfunction of lumbar region: Secondary | ICD-10-CM | POA: Diagnosis not present

## 2017-01-09 DIAGNOSIS — M542 Cervicalgia: Secondary | ICD-10-CM | POA: Diagnosis not present

## 2017-01-09 DIAGNOSIS — M545 Low back pain: Secondary | ICD-10-CM | POA: Diagnosis not present

## 2017-01-24 DIAGNOSIS — M9901 Segmental and somatic dysfunction of cervical region: Secondary | ICD-10-CM | POA: Diagnosis not present

## 2017-01-24 DIAGNOSIS — M542 Cervicalgia: Secondary | ICD-10-CM | POA: Diagnosis not present

## 2017-01-24 DIAGNOSIS — M9903 Segmental and somatic dysfunction of lumbar region: Secondary | ICD-10-CM | POA: Diagnosis not present

## 2017-01-24 DIAGNOSIS — M545 Low back pain: Secondary | ICD-10-CM | POA: Diagnosis not present

## 2017-01-29 ENCOUNTER — Ambulatory Visit (INDEPENDENT_AMBULATORY_CARE_PROVIDER_SITE_OTHER): Payer: Medicare Other | Admitting: *Deleted

## 2017-01-29 DIAGNOSIS — M9903 Segmental and somatic dysfunction of lumbar region: Secondary | ICD-10-CM | POA: Diagnosis not present

## 2017-01-29 DIAGNOSIS — M9901 Segmental and somatic dysfunction of cervical region: Secondary | ICD-10-CM | POA: Diagnosis not present

## 2017-01-29 DIAGNOSIS — M542 Cervicalgia: Secondary | ICD-10-CM | POA: Diagnosis not present

## 2017-01-29 DIAGNOSIS — Z5181 Encounter for therapeutic drug level monitoring: Secondary | ICD-10-CM

## 2017-01-29 DIAGNOSIS — I480 Paroxysmal atrial fibrillation: Secondary | ICD-10-CM | POA: Diagnosis not present

## 2017-01-29 DIAGNOSIS — M545 Low back pain: Secondary | ICD-10-CM | POA: Diagnosis not present

## 2017-01-29 LAB — POCT INR: INR: 2.4

## 2017-02-26 ENCOUNTER — Ambulatory Visit (INDEPENDENT_AMBULATORY_CARE_PROVIDER_SITE_OTHER): Payer: Medicare Other

## 2017-02-26 DIAGNOSIS — Z5181 Encounter for therapeutic drug level monitoring: Secondary | ICD-10-CM | POA: Diagnosis not present

## 2017-02-26 DIAGNOSIS — I480 Paroxysmal atrial fibrillation: Secondary | ICD-10-CM

## 2017-02-26 LAB — POCT INR: INR: 3.4

## 2017-03-19 ENCOUNTER — Ambulatory Visit (INDEPENDENT_AMBULATORY_CARE_PROVIDER_SITE_OTHER): Payer: Medicare Other | Admitting: *Deleted

## 2017-03-19 DIAGNOSIS — I480 Paroxysmal atrial fibrillation: Secondary | ICD-10-CM

## 2017-03-19 DIAGNOSIS — Z5181 Encounter for therapeutic drug level monitoring: Secondary | ICD-10-CM | POA: Diagnosis not present

## 2017-03-19 LAB — POCT INR: INR: 2.4

## 2017-04-02 DIAGNOSIS — M47812 Spondylosis without myelopathy or radiculopathy, cervical region: Secondary | ICD-10-CM | POA: Diagnosis not present

## 2017-04-02 DIAGNOSIS — W01198A Fall on same level from slipping, tripping and stumbling with subsequent striking against other object, initial encounter: Secondary | ICD-10-CM | POA: Diagnosis not present

## 2017-04-02 DIAGNOSIS — R791 Abnormal coagulation profile: Secondary | ICD-10-CM | POA: Diagnosis not present

## 2017-04-02 DIAGNOSIS — M50322 Other cervical disc degeneration at C5-C6 level: Secondary | ICD-10-CM | POA: Diagnosis not present

## 2017-04-02 DIAGNOSIS — R930 Abnormal findings on diagnostic imaging of skull and head, not elsewhere classified: Secondary | ICD-10-CM | POA: Diagnosis not present

## 2017-04-02 DIAGNOSIS — M47892 Other spondylosis, cervical region: Secondary | ICD-10-CM | POA: Diagnosis not present

## 2017-04-02 DIAGNOSIS — S199XXA Unspecified injury of neck, initial encounter: Secondary | ICD-10-CM | POA: Diagnosis not present

## 2017-04-02 DIAGNOSIS — I10 Essential (primary) hypertension: Secondary | ICD-10-CM | POA: Diagnosis not present

## 2017-04-02 DIAGNOSIS — M5021 Other cervical disc displacement,  high cervical region: Secondary | ICD-10-CM | POA: Diagnosis not present

## 2017-04-02 DIAGNOSIS — S0990XA Unspecified injury of head, initial encounter: Secondary | ICD-10-CM | POA: Diagnosis not present

## 2017-04-02 DIAGNOSIS — Z853 Personal history of malignant neoplasm of breast: Secondary | ICD-10-CM | POA: Diagnosis not present

## 2017-04-08 DIAGNOSIS — I10 Essential (primary) hypertension: Secondary | ICD-10-CM | POA: Diagnosis not present

## 2017-04-08 DIAGNOSIS — I480 Paroxysmal atrial fibrillation: Secondary | ICD-10-CM | POA: Diagnosis not present

## 2017-04-08 DIAGNOSIS — E7800 Pure hypercholesterolemia, unspecified: Secondary | ICD-10-CM | POA: Diagnosis not present

## 2017-04-17 ENCOUNTER — Ambulatory Visit (INDEPENDENT_AMBULATORY_CARE_PROVIDER_SITE_OTHER): Payer: Medicare Other | Admitting: *Deleted

## 2017-04-17 DIAGNOSIS — Z5181 Encounter for therapeutic drug level monitoring: Secondary | ICD-10-CM | POA: Diagnosis not present

## 2017-04-17 DIAGNOSIS — I480 Paroxysmal atrial fibrillation: Secondary | ICD-10-CM

## 2017-04-17 LAB — POCT INR: INR: 2.6

## 2017-04-17 NOTE — Patient Instructions (Signed)
Continue taking same dosage 1 tablet daily except 1.5 tablets on Wednesdays. Recheck INR in 4 weeks.

## 2017-05-15 ENCOUNTER — Ambulatory Visit (INDEPENDENT_AMBULATORY_CARE_PROVIDER_SITE_OTHER): Payer: Medicare Other | Admitting: *Deleted

## 2017-05-15 DIAGNOSIS — Z5181 Encounter for therapeutic drug level monitoring: Secondary | ICD-10-CM | POA: Diagnosis not present

## 2017-05-15 DIAGNOSIS — I480 Paroxysmal atrial fibrillation: Secondary | ICD-10-CM

## 2017-05-15 LAB — POCT INR: INR: 2.6

## 2017-05-15 NOTE — Patient Instructions (Signed)
Description   Continue taking same dosage 1 tablet daily except 1.5 tablets on Wednesdays. Recheck INR in 6 weeks. Call us any medication changes or concerns # [phone] [facility] # [phone].

## 2017-05-22 ENCOUNTER — Other Ambulatory Visit: Payer: Self-pay | Admitting: *Deleted

## 2017-05-22 MED ORDER — WARFARIN SODIUM 5 MG PO TABS: ORAL_TABLET | ORAL | 1 refills | Status: DC

## 2017-05-22 NOTE — Telephone Encounter (Addendum)
Pt dropped off paperwork regarding needing a refill & insurance info attached. Called the Customer Service provider line on the card & she stated that the use [address] Order Service, [phone], [phone]. Therefore, sent in Rx as requested for pt. Called pt to update, too.

## 2017-05-26 ENCOUNTER — Other Ambulatory Visit: Payer: Self-pay | Admitting: *Deleted

## 2017-05-26 MED ORDER — WARFARIN SODIUM 5 MG PO TABS: ORAL_TABLET | ORAL | 1 refills | Status: DC

## 2017-05-29 DIAGNOSIS — I10 Essential (primary) hypertension: Secondary | ICD-10-CM | POA: Diagnosis not present

## 2017-05-29 DIAGNOSIS — I4891 Unspecified atrial fibrillation: Secondary | ICD-10-CM | POA: Diagnosis not present

## 2017-05-29 DIAGNOSIS — R42 Dizziness and giddiness: Secondary | ICD-10-CM | POA: Diagnosis not present

## 2017-05-29 DIAGNOSIS — E1140 Type 2 diabetes mellitus with diabetic neuropathy, unspecified: Secondary | ICD-10-CM | POA: Diagnosis not present

## 2017-06-11 DIAGNOSIS — I10 Essential (primary) hypertension: Secondary | ICD-10-CM | POA: Diagnosis not present

## 2017-06-11 DIAGNOSIS — E559 Vitamin D deficiency, unspecified: Secondary | ICD-10-CM | POA: Diagnosis not present

## 2017-06-11 DIAGNOSIS — E039 Hypothyroidism, unspecified: Secondary | ICD-10-CM | POA: Diagnosis not present

## 2017-06-11 DIAGNOSIS — R7309 Other abnormal glucose: Secondary | ICD-10-CM | POA: Diagnosis not present

## 2017-06-18 DIAGNOSIS — I480 Paroxysmal atrial fibrillation: Secondary | ICD-10-CM | POA: Diagnosis not present

## 2017-06-18 DIAGNOSIS — I10 Essential (primary) hypertension: Secondary | ICD-10-CM | POA: Diagnosis not present

## 2017-06-18 DIAGNOSIS — E7800 Pure hypercholesterolemia, unspecified: Secondary | ICD-10-CM | POA: Diagnosis not present

## 2017-06-18 DIAGNOSIS — E559 Vitamin D deficiency, unspecified: Secondary | ICD-10-CM | POA: Diagnosis not present

## 2017-06-18 DIAGNOSIS — Z0000 Encounter for general adult medical examination without abnormal findings: Secondary | ICD-10-CM | POA: Diagnosis not present

## 2017-06-25 DIAGNOSIS — W19XXXD Unspecified fall, subsequent encounter: Secondary | ICD-10-CM | POA: Diagnosis not present

## 2017-06-25 DIAGNOSIS — R42 Dizziness and giddiness: Secondary | ICD-10-CM | POA: Diagnosis not present

## 2017-06-25 DIAGNOSIS — I10 Essential (primary) hypertension: Secondary | ICD-10-CM | POA: Diagnosis not present

## 2017-06-25 DIAGNOSIS — E1140 Type 2 diabetes mellitus with diabetic neuropathy, unspecified: Secondary | ICD-10-CM | POA: Diagnosis not present

## 2017-06-26 ENCOUNTER — Ambulatory Visit (INDEPENDENT_AMBULATORY_CARE_PROVIDER_SITE_OTHER): Payer: Medicare Other

## 2017-06-26 DIAGNOSIS — I480 Paroxysmal atrial fibrillation: Secondary | ICD-10-CM | POA: Diagnosis not present

## 2017-06-26 DIAGNOSIS — Z5181 Encounter for therapeutic drug level monitoring: Secondary | ICD-10-CM | POA: Diagnosis not present

## 2017-06-26 LAB — POCT INR: INR: 2.7

## 2017-06-26 NOTE — Patient Instructions (Signed)
Description   Continue taking same dosage 1 tablet daily except 1.5 tablets on Wednesdays. Recheck INR in 6 weeks. Call us any medication changes or concerns # [phone] [facility] # [phone].

## 2017-08-07 ENCOUNTER — Ambulatory Visit (INDEPENDENT_AMBULATORY_CARE_PROVIDER_SITE_OTHER): Payer: Medicare Other | Admitting: *Deleted

## 2017-08-07 DIAGNOSIS — I480 Paroxysmal atrial fibrillation: Secondary | ICD-10-CM | POA: Diagnosis not present

## 2017-08-07 DIAGNOSIS — Z5181 Encounter for therapeutic drug level monitoring: Secondary | ICD-10-CM | POA: Diagnosis not present

## 2017-08-07 LAB — POCT INR: INR: 2.6

## 2017-08-07 NOTE — Patient Instructions (Signed)
Description   Continue taking same dosage 1 tablet daily except 1.5 tablets on Wednesdays. Recheck INR in 6 weeks. Call us any medication changes or concerns # [phone] [facility] # [phone].

## 2017-09-11 DIAGNOSIS — I10 Essential (primary) hypertension: Secondary | ICD-10-CM | POA: Diagnosis not present

## 2017-09-11 DIAGNOSIS — E7439 Other disorders of intestinal carbohydrate absorption: Secondary | ICD-10-CM | POA: Diagnosis not present

## 2017-09-18 ENCOUNTER — Ambulatory Visit (INDEPENDENT_AMBULATORY_CARE_PROVIDER_SITE_OTHER): Payer: Medicare Other | Admitting: *Deleted

## 2017-09-18 DIAGNOSIS — R7309 Other abnormal glucose: Secondary | ICD-10-CM | POA: Diagnosis not present

## 2017-09-18 DIAGNOSIS — Z5181 Encounter for therapeutic drug level monitoring: Secondary | ICD-10-CM | POA: Diagnosis not present

## 2017-09-18 DIAGNOSIS — E7800 Pure hypercholesterolemia, unspecified: Secondary | ICD-10-CM | POA: Diagnosis not present

## 2017-09-18 DIAGNOSIS — I480 Paroxysmal atrial fibrillation: Secondary | ICD-10-CM | POA: Diagnosis not present

## 2017-09-18 DIAGNOSIS — I10 Essential (primary) hypertension: Secondary | ICD-10-CM | POA: Diagnosis not present

## 2017-09-18 DIAGNOSIS — E039 Hypothyroidism, unspecified: Secondary | ICD-10-CM | POA: Diagnosis not present

## 2017-09-18 LAB — POCT INR: INR: 2

## 2017-09-18 NOTE — Patient Instructions (Signed)
Description   Today take 1.5 tablets, then Continue taking same dosage 1 tablet daily except 1.5 tablets on Wednesdays. Recheck INR in 6 weeks. Call us any medication changes or concerns # [phone] [facility] # [phone].

## 2017-10-18 DIAGNOSIS — E119 Type 2 diabetes mellitus without complications: Secondary | ICD-10-CM | POA: Diagnosis not present

## 2017-10-18 DIAGNOSIS — I4891 Unspecified atrial fibrillation: Secondary | ICD-10-CM | POA: Diagnosis not present

## 2017-10-18 DIAGNOSIS — N950 Postmenopausal bleeding: Secondary | ICD-10-CM | POA: Diagnosis not present

## 2017-10-18 DIAGNOSIS — Z79899 Other long term (current) drug therapy: Secondary | ICD-10-CM | POA: Diagnosis not present

## 2017-10-18 DIAGNOSIS — Z7901 Long term (current) use of anticoagulants: Secondary | ICD-10-CM | POA: Diagnosis not present

## 2017-10-18 DIAGNOSIS — I10 Essential (primary) hypertension: Secondary | ICD-10-CM | POA: Diagnosis not present

## 2017-10-18 DIAGNOSIS — Z7984 Long term (current) use of oral hypoglycemic drugs: Secondary | ICD-10-CM | POA: Diagnosis not present

## 2017-11-06 ENCOUNTER — Ambulatory Visit (INDEPENDENT_AMBULATORY_CARE_PROVIDER_SITE_OTHER): Payer: Medicare Other | Admitting: *Deleted

## 2017-11-06 DIAGNOSIS — Z5181 Encounter for therapeutic drug level monitoring: Secondary | ICD-10-CM | POA: Diagnosis not present

## 2017-11-06 DIAGNOSIS — I480 Paroxysmal atrial fibrillation: Secondary | ICD-10-CM | POA: Diagnosis not present

## 2017-11-06 LAB — POCT INR: INR: 3.3 — AB (ref 2.0–3.0)

## 2017-11-06 NOTE — Patient Instructions (Signed)
Description   Do not take any Coumadin today then continue taking same dosage 1 tablet daily except 1.5 tablets on Wednesdays. Recheck INR in 4 weeks. Call us any medication changes or concerns # [phone] [facility] # [phone].

## 2017-12-04 ENCOUNTER — Ambulatory Visit: Payer: Medicare Other | Admitting: *Deleted

## 2017-12-04 DIAGNOSIS — I10 Essential (primary) hypertension: Secondary | ICD-10-CM | POA: Diagnosis not present

## 2017-12-04 DIAGNOSIS — N950 Postmenopausal bleeding: Secondary | ICD-10-CM | POA: Diagnosis not present

## 2017-12-04 DIAGNOSIS — I480 Paroxysmal atrial fibrillation: Secondary | ICD-10-CM

## 2017-12-04 DIAGNOSIS — Z5181 Encounter for therapeutic drug level monitoring: Secondary | ICD-10-CM | POA: Diagnosis not present

## 2017-12-04 DIAGNOSIS — Z9011 Acquired absence of right breast and nipple: Secondary | ICD-10-CM | POA: Diagnosis not present

## 2017-12-04 LAB — POCT INR: INR: 2.9 (ref 2.0–3.0)

## 2017-12-04 NOTE — Patient Instructions (Signed)
Description   Continue taking same dosage 1 tablet daily except 1.5 tablets on Wednesdays. Recheck INR in 4 weeks. Call us any medication changes or concerns # [phone] [facility] # [phone].

## 2018-01-01 DIAGNOSIS — N950 Postmenopausal bleeding: Secondary | ICD-10-CM | POA: Diagnosis not present

## 2018-01-05 ENCOUNTER — Telehealth: Payer: Self-pay | Admitting: *Deleted

## 2018-01-05 NOTE — Telephone Encounter (Signed)
Pt called and stated she does not need to hold her Coumadin for her upcoming procedure on next week. She states that her Physician is aware of her being on Coumadin and that she spoke to [employer] last week regarding this and was told to call back once she found out the information. Therefore, she is calling back today. Noted hat the pt is overdue and asked pt to make an appt & she did. Appt set and pt will give more details about procedure on that visit.

## 2018-01-07 ENCOUNTER — Ambulatory Visit (INDEPENDENT_AMBULATORY_CARE_PROVIDER_SITE_OTHER): Payer: Medicare Other | Admitting: *Deleted

## 2018-01-07 DIAGNOSIS — N950 Postmenopausal bleeding: Secondary | ICD-10-CM | POA: Diagnosis not present

## 2018-01-07 DIAGNOSIS — Z5181 Encounter for therapeutic drug level monitoring: Secondary | ICD-10-CM | POA: Diagnosis not present

## 2018-01-07 DIAGNOSIS — I480 Paroxysmal atrial fibrillation: Secondary | ICD-10-CM | POA: Diagnosis not present

## 2018-01-07 DIAGNOSIS — Z01818 Encounter for other preprocedural examination: Secondary | ICD-10-CM | POA: Diagnosis not present

## 2018-01-07 DIAGNOSIS — I440 Atrioventricular block, first degree: Secondary | ICD-10-CM | POA: Diagnosis not present

## 2018-01-07 LAB — POCT INR: INR: 2.2 (ref 2.0–3.0)

## 2018-01-07 NOTE — Patient Instructions (Addendum)
Description   Continue taking same dosage 1 tablet daily except 1.5 tablets on Wednesdays. Recheck INR in 5 weeks. Call us any medication changes or concerns # [phone] [facility] Main # [phone].

## 2018-01-08 DIAGNOSIS — N950 Postmenopausal bleeding: Secondary | ICD-10-CM | POA: Diagnosis not present

## 2018-01-08 DIAGNOSIS — N882 Stricture and stenosis of cervix uteri: Secondary | ICD-10-CM | POA: Diagnosis not present

## 2018-01-15 DIAGNOSIS — Z880 Allergy status to penicillin: Secondary | ICD-10-CM | POA: Diagnosis not present

## 2018-01-15 DIAGNOSIS — Z7984 Long term (current) use of oral hypoglycemic drugs: Secondary | ICD-10-CM | POA: Diagnosis not present

## 2018-01-15 DIAGNOSIS — I4891 Unspecified atrial fibrillation: Secondary | ICD-10-CM | POA: Diagnosis not present

## 2018-01-15 DIAGNOSIS — R310 Gross hematuria: Secondary | ICD-10-CM | POA: Diagnosis not present

## 2018-01-15 DIAGNOSIS — Z7901 Long term (current) use of anticoagulants: Secondary | ICD-10-CM | POA: Diagnosis not present

## 2018-01-15 DIAGNOSIS — I440 Atrioventricular block, first degree: Secondary | ICD-10-CM | POA: Diagnosis not present

## 2018-01-15 DIAGNOSIS — N3090 Cystitis, unspecified without hematuria: Secondary | ICD-10-CM | POA: Diagnosis not present

## 2018-01-15 DIAGNOSIS — N3091 Cystitis, unspecified with hematuria: Secondary | ICD-10-CM | POA: Diagnosis not present

## 2018-01-15 DIAGNOSIS — N2889 Other specified disorders of kidney and ureter: Secondary | ICD-10-CM | POA: Diagnosis not present

## 2018-01-15 DIAGNOSIS — E1142 Type 2 diabetes mellitus with diabetic polyneuropathy: Secondary | ICD-10-CM | POA: Diagnosis not present

## 2018-01-15 DIAGNOSIS — B9620 Unspecified Escherichia coli [E. coli] as the cause of diseases classified elsewhere: Secondary | ICD-10-CM | POA: Diagnosis not present

## 2018-01-15 DIAGNOSIS — Z79899 Other long term (current) drug therapy: Secondary | ICD-10-CM | POA: Diagnosis not present

## 2018-01-15 DIAGNOSIS — E039 Hypothyroidism, unspecified: Secondary | ICD-10-CM | POA: Diagnosis not present

## 2018-01-15 DIAGNOSIS — I10 Essential (primary) hypertension: Secondary | ICD-10-CM | POA: Diagnosis not present

## 2018-01-15 DIAGNOSIS — R319 Hematuria, unspecified: Secondary | ICD-10-CM | POA: Diagnosis not present

## 2018-01-15 DIAGNOSIS — K449 Diaphragmatic hernia without obstruction or gangrene: Secondary | ICD-10-CM | POA: Diagnosis not present

## 2018-01-15 DIAGNOSIS — J45909 Unspecified asthma, uncomplicated: Secondary | ICD-10-CM | POA: Diagnosis not present

## 2018-01-15 DIAGNOSIS — E1143 Type 2 diabetes mellitus with diabetic autonomic (poly)neuropathy: Secondary | ICD-10-CM | POA: Diagnosis not present

## 2018-01-15 DIAGNOSIS — N390 Urinary tract infection, site not specified: Secondary | ICD-10-CM | POA: Diagnosis not present

## 2018-01-15 DIAGNOSIS — R001 Bradycardia, unspecified: Secondary | ICD-10-CM | POA: Diagnosis not present

## 2018-01-16 DIAGNOSIS — Z7984 Long term (current) use of oral hypoglycemic drugs: Secondary | ICD-10-CM | POA: Diagnosis not present

## 2018-01-16 DIAGNOSIS — J45909 Unspecified asthma, uncomplicated: Secondary | ICD-10-CM | POA: Diagnosis not present

## 2018-01-16 DIAGNOSIS — E039 Hypothyroidism, unspecified: Secondary | ICD-10-CM | POA: Diagnosis not present

## 2018-01-16 DIAGNOSIS — Z880 Allergy status to penicillin: Secondary | ICD-10-CM | POA: Diagnosis not present

## 2018-01-16 DIAGNOSIS — I4891 Unspecified atrial fibrillation: Secondary | ICD-10-CM | POA: Diagnosis not present

## 2018-01-16 DIAGNOSIS — E1142 Type 2 diabetes mellitus with diabetic polyneuropathy: Secondary | ICD-10-CM | POA: Diagnosis not present

## 2018-01-16 DIAGNOSIS — Z7901 Long term (current) use of anticoagulants: Secondary | ICD-10-CM | POA: Diagnosis not present

## 2018-01-16 DIAGNOSIS — N3091 Cystitis, unspecified with hematuria: Secondary | ICD-10-CM | POA: Diagnosis not present

## 2018-01-16 DIAGNOSIS — Z79899 Other long term (current) drug therapy: Secondary | ICD-10-CM | POA: Diagnosis not present

## 2018-01-16 DIAGNOSIS — I10 Essential (primary) hypertension: Secondary | ICD-10-CM | POA: Diagnosis not present

## 2018-01-16 DIAGNOSIS — B9620 Unspecified Escherichia coli [E. coli] as the cause of diseases classified elsewhere: Secondary | ICD-10-CM | POA: Diagnosis not present

## 2018-01-17 DIAGNOSIS — Z7901 Long term (current) use of anticoagulants: Secondary | ICD-10-CM | POA: Diagnosis not present

## 2018-01-17 DIAGNOSIS — N3091 Cystitis, unspecified with hematuria: Secondary | ICD-10-CM | POA: Diagnosis not present

## 2018-01-17 DIAGNOSIS — Z7984 Long term (current) use of oral hypoglycemic drugs: Secondary | ICD-10-CM | POA: Diagnosis not present

## 2018-01-17 DIAGNOSIS — J45909 Unspecified asthma, uncomplicated: Secondary | ICD-10-CM | POA: Diagnosis not present

## 2018-01-17 DIAGNOSIS — E039 Hypothyroidism, unspecified: Secondary | ICD-10-CM | POA: Diagnosis not present

## 2018-01-17 DIAGNOSIS — I10 Essential (primary) hypertension: Secondary | ICD-10-CM | POA: Diagnosis not present

## 2018-01-17 DIAGNOSIS — Z880 Allergy status to penicillin: Secondary | ICD-10-CM | POA: Diagnosis not present

## 2018-01-17 DIAGNOSIS — B9620 Unspecified Escherichia coli [E. coli] as the cause of diseases classified elsewhere: Secondary | ICD-10-CM | POA: Diagnosis not present

## 2018-01-17 DIAGNOSIS — Z79899 Other long term (current) drug therapy: Secondary | ICD-10-CM | POA: Diagnosis not present

## 2018-01-17 DIAGNOSIS — E1142 Type 2 diabetes mellitus with diabetic polyneuropathy: Secondary | ICD-10-CM | POA: Diagnosis not present

## 2018-01-17 DIAGNOSIS — I4891 Unspecified atrial fibrillation: Secondary | ICD-10-CM | POA: Diagnosis not present

## 2018-01-18 DIAGNOSIS — Z7901 Long term (current) use of anticoagulants: Secondary | ICD-10-CM | POA: Diagnosis not present

## 2018-01-18 DIAGNOSIS — Z7984 Long term (current) use of oral hypoglycemic drugs: Secondary | ICD-10-CM | POA: Diagnosis not present

## 2018-01-18 DIAGNOSIS — I10 Essential (primary) hypertension: Secondary | ICD-10-CM | POA: Diagnosis not present

## 2018-01-18 DIAGNOSIS — E1142 Type 2 diabetes mellitus with diabetic polyneuropathy: Secondary | ICD-10-CM | POA: Diagnosis not present

## 2018-01-18 DIAGNOSIS — Z79899 Other long term (current) drug therapy: Secondary | ICD-10-CM | POA: Diagnosis not present

## 2018-01-18 DIAGNOSIS — N3091 Cystitis, unspecified with hematuria: Secondary | ICD-10-CM | POA: Diagnosis not present

## 2018-01-18 DIAGNOSIS — Z880 Allergy status to penicillin: Secondary | ICD-10-CM | POA: Diagnosis not present

## 2018-01-18 DIAGNOSIS — I4891 Unspecified atrial fibrillation: Secondary | ICD-10-CM | POA: Diagnosis not present

## 2018-01-18 DIAGNOSIS — J45909 Unspecified asthma, uncomplicated: Secondary | ICD-10-CM | POA: Diagnosis not present

## 2018-01-18 DIAGNOSIS — E039 Hypothyroidism, unspecified: Secondary | ICD-10-CM | POA: Diagnosis not present

## 2018-01-18 DIAGNOSIS — B9620 Unspecified Escherichia coli [E. coli] as the cause of diseases classified elsewhere: Secondary | ICD-10-CM | POA: Diagnosis not present

## 2018-01-20 ENCOUNTER — Telehealth: Payer: Self-pay | Admitting: *Deleted

## 2018-01-20 NOTE — Telephone Encounter (Signed)
Pt calls to states she was in hospital for 5 days and discharged home on 01/18/18. She states she had hematuria that caused her to go to ER in [facility] and was admitted. Coumadin was held entire hospital stay but was instructed to resume on 01/18/18 if her normal dosage. Pt is currently on Cipro 500mg  BID for the next 5 days. INR at D/C was 2.2 per pt. Since pt is on Cipro will recheck INR this week.

## 2018-01-23 ENCOUNTER — Ambulatory Visit (INDEPENDENT_AMBULATORY_CARE_PROVIDER_SITE_OTHER): Payer: Medicare Other | Admitting: *Deleted

## 2018-01-23 DIAGNOSIS — I480 Paroxysmal atrial fibrillation: Secondary | ICD-10-CM | POA: Diagnosis not present

## 2018-01-23 DIAGNOSIS — Z5181 Encounter for therapeutic drug level monitoring: Secondary | ICD-10-CM

## 2018-01-23 LAB — POCT INR: INR: 1.8 — AB (ref 2.0–3.0)

## 2018-01-23 NOTE — Patient Instructions (Signed)
Description   Today take 1.5 tablets then continue taking same dosage 1 tablet daily except 1.5 tablets on Wednesdays. Recheck INR in 2 weeks. Call us any medication changes or concerns # [phone] [facility] # [phone].

## 2018-01-26 DIAGNOSIS — R310 Gross hematuria: Secondary | ICD-10-CM | POA: Diagnosis not present

## 2018-02-04 DIAGNOSIS — D3502 Benign neoplasm of left adrenal gland: Secondary | ICD-10-CM | POA: Diagnosis not present

## 2018-02-04 DIAGNOSIS — K760 Fatty (change of) liver, not elsewhere classified: Secondary | ICD-10-CM | POA: Diagnosis not present

## 2018-02-04 DIAGNOSIS — D3501 Benign neoplasm of right adrenal gland: Secondary | ICD-10-CM | POA: Diagnosis not present

## 2018-02-04 DIAGNOSIS — I700 Atherosclerosis of aorta: Secondary | ICD-10-CM | POA: Diagnosis not present

## 2018-02-04 DIAGNOSIS — K449 Diaphragmatic hernia without obstruction or gangrene: Secondary | ICD-10-CM | POA: Diagnosis not present

## 2018-02-04 DIAGNOSIS — N2889 Other specified disorders of kidney and ureter: Secondary | ICD-10-CM | POA: Diagnosis not present

## 2018-02-04 DIAGNOSIS — N289 Disorder of kidney and ureter, unspecified: Secondary | ICD-10-CM | POA: Diagnosis not present

## 2018-02-06 ENCOUNTER — Ambulatory Visit (INDEPENDENT_AMBULATORY_CARE_PROVIDER_SITE_OTHER): Payer: Medicare Other | Admitting: *Deleted

## 2018-02-06 DIAGNOSIS — I480 Paroxysmal atrial fibrillation: Secondary | ICD-10-CM

## 2018-02-06 DIAGNOSIS — Z5181 Encounter for therapeutic drug level monitoring: Secondary | ICD-10-CM | POA: Diagnosis not present

## 2018-02-06 LAB — POCT INR: INR: 3.6 — AB (ref 2.0–3.0)

## 2018-02-06 NOTE — Patient Instructions (Signed)
Description   Skip today's dose, then Continue taking same dosage 1 tablet daily except 1.5 tablets on Wednesdays. Recheck INR in 2 weeks.Resume your regular leafy green vegetable intake.  Call us any medication changes or concerns # [phone] [facility] # [phone].

## 2018-02-17 ENCOUNTER — Other Ambulatory Visit: Payer: Self-pay | Admitting: Internal Medicine

## 2018-02-27 ENCOUNTER — Ambulatory Visit: Payer: Medicare Other

## 2018-02-27 DIAGNOSIS — Z5181 Encounter for therapeutic drug level monitoring: Secondary | ICD-10-CM | POA: Diagnosis not present

## 2018-02-27 DIAGNOSIS — I480 Paroxysmal atrial fibrillation: Secondary | ICD-10-CM | POA: Diagnosis not present

## 2018-02-27 LAB — POCT INR: INR: 3.3 — AB (ref 2.0–3.0)

## 2018-02-27 NOTE — Patient Instructions (Signed)
Description   Skip today's dose, then start taking 1 tablet daily.  Recheck INR in 2 weeks. Resume your regular leafy green vegetable intake.  Call us any medication changes or concerns # [phone] [facility] Main # [phone].

## 2018-03-03 DIAGNOSIS — N3592 Unspecified urethral stricture, female: Secondary | ICD-10-CM | POA: Diagnosis not present

## 2018-03-13 ENCOUNTER — Ambulatory Visit: Payer: Medicare Other | Admitting: *Deleted

## 2018-03-13 DIAGNOSIS — I480 Paroxysmal atrial fibrillation: Secondary | ICD-10-CM

## 2018-03-13 DIAGNOSIS — Z5181 Encounter for therapeutic drug level monitoring: Secondary | ICD-10-CM

## 2018-03-13 LAB — POCT INR: INR: 3.1 — AB (ref 2.0–3.0)

## 2018-03-13 NOTE — Patient Instructions (Signed)
Description   Start taking 1 tablet daily except 1/2 tablet on Fridays.  Recheck INR in 2 weeks. Continue eating  your regular leafy green vegetables.  Call us any medication changes or concerns # [phone] [facility] # [phone].

## 2018-03-19 DIAGNOSIS — R7309 Other abnormal glucose: Secondary | ICD-10-CM | POA: Diagnosis not present

## 2018-03-19 DIAGNOSIS — E039 Hypothyroidism, unspecified: Secondary | ICD-10-CM | POA: Diagnosis not present

## 2018-03-19 DIAGNOSIS — I10 Essential (primary) hypertension: Secondary | ICD-10-CM | POA: Diagnosis not present

## 2018-03-26 DIAGNOSIS — E039 Hypothyroidism, unspecified: Secondary | ICD-10-CM | POA: Diagnosis not present

## 2018-03-26 DIAGNOSIS — E7800 Pure hypercholesterolemia, unspecified: Secondary | ICD-10-CM | POA: Diagnosis not present

## 2018-03-26 DIAGNOSIS — R7309 Other abnormal glucose: Secondary | ICD-10-CM | POA: Diagnosis not present

## 2018-03-26 DIAGNOSIS — I10 Essential (primary) hypertension: Secondary | ICD-10-CM | POA: Diagnosis not present

## 2018-03-27 ENCOUNTER — Ambulatory Visit: Payer: Medicare Other | Admitting: Pharmacist

## 2018-03-27 DIAGNOSIS — Z5181 Encounter for therapeutic drug level monitoring: Secondary | ICD-10-CM

## 2018-03-27 DIAGNOSIS — I480 Paroxysmal atrial fibrillation: Secondary | ICD-10-CM

## 2018-03-27 LAB — POCT INR: INR: 2.5 (ref 2.0–3.0)

## 2018-03-27 NOTE — Patient Instructions (Signed)
Description   Continue taking 1 tablet daily except 1/2 tablet on Fridays.  Recheck INR in 3 weeks. Continue eating  your regular leafy green vegetables.  Call us any medication changes or concerns # [phone] [facility] Main # [phone].

## 2018-04-01 ENCOUNTER — Ambulatory Visit: Payer: Medicare Other | Admitting: Internal Medicine

## 2018-04-01 VITALS — BP 186/86 | HR 50 | Ht 67.0 in | Wt 197.4 lb

## 2018-04-01 DIAGNOSIS — R001 Bradycardia, unspecified: Secondary | ICD-10-CM

## 2018-04-01 DIAGNOSIS — I480 Paroxysmal atrial fibrillation: Secondary | ICD-10-CM | POA: Diagnosis not present

## 2018-04-01 DIAGNOSIS — I10 Essential (primary) hypertension: Secondary | ICD-10-CM

## 2018-04-01 NOTE — Patient Instructions (Addendum)
Medication Instructions:  Your physician recommends that you continue on your current medications as directed. Please refer to the Current Medication list given to you today.  Labwork: None ordered.  Testing/Procedures: None ordered.  Follow-Up: Your physician wants you to follow-up in: 6 months with Renee Ursuy, PA. You will receive a reminder letter in the mail two months in advance. If you don't receive a letter, please call our office to schedule the follow-up appointment.   Any Other Special Instructions Will Be Listed Below (If Applicable).     If you need a refill on your cardiac medications before your next appointment, please call your pharmacy.  

## 2018-04-01 NOTE — Progress Notes (Signed)
PCP: Jani Gravel, MD   Primary EP: Dr Rayann Heman  Gloria Joseph is a 77 y.o. female who presents today for routine electrophysiology followup.  Since last being seen in our clinic, the patient reports doing very well.  She had urinary bleeding with obstruction several months ago for which she saw urology.  This has resolved.  She follows with urology.  Today, she denies symptoms of palpitations, chest pain, shortness of breath,  lower extremity edema, dizziness, presyncope, or syncope.  The patient is otherwise without complaint today.   Past Medical History:  Diagnosis Date  . Anemia 12/18/2011  . Atrial fibrillation ([place]) 04/13/2011  . Cancer [facility])    breast CA s/p Right mastectomy  . Diabetes mellitus, type 2 (HCC)    diet controlled  . Hx of echocardiogram    Echocardiogram (12/15): Moderate LVH, EF 55-60%, normal wall motion, grade 1 diastolic dysfunction, aortic sclerosis without stenosis, mild AI, MAC  . Hyperlipidemia   . Hypertension    medicine refractory  . Hypothyroidism    s/p thryroid gland ablation  . Neuromuscular disorder ([place])   . Osteopenia   . Renal insufficiency, mild 09/23/2013   Past Surgical History:  Procedure Laterality Date  . BREAST ENHANCEMENT SURGERY    . BREAST IMPLANT REMOVAL  2000   Unilaterally   . BREAST SURGERY    . CHOLECYSTECTOMY    . COLONOSCOPY  2002   Neg  . MASTECTOMY    . RAI  04/04/2006  . TUBAL LIGATION      ROS- all systems are reviewed and negatives except as per HPI above  Current Outpatient Medications  Medication Sig Dispense Refill  . CALCIUM-MAGNESIUM-ZINC PO Take 1 capsule by mouth daily.    . carvedilol (COREG) 3.125 MG tablet Take 1 tablet by mouth 2 (two) times daily.    . Cholecalciferol (VITAMIN D-3) 1000 UNITS CAPS Take 1 capsule by mouth daily.    . citalopram (CELEXA) 20 MG tablet Take 20 mg by mouth daily.    . Coenzyme Q10 (CO Q 10 PO) Take 1 capsule by mouth daily.    . Flaxseed, Linseed, (FLAXSEED OIL PO)  Take 1 tablet by mouth daily.    . hydrALAZINE (APRESOLINE) 100 MG tablet Take 150 mg by mouth 2 (two) times daily.    Marland Kitchen levothyroxine (SYNTHROID, LEVOTHROID) 112 MCG tablet Take 1 tablet (112 mcg) by mouth daily. EXCEPT Tuesdays and Thursdays take 1/2 tablet (56 mcg) by mouth daily.    Marland Kitchen losartan (COZAAR) 100 MG tablet Take 100 mg by mouth daily.    . meclizine (ANTIVERT) 25 MG tablet Take 25 mg by mouth 2 (two) times daily as needed for dizziness. Taking 1/2 tablet PRN QD    . metFORMIN (GLUCOPHAGE) 1000 MG tablet Take 500 mg by mouth 2 (two) times daily.    . Multiple Vitamins-Minerals (MULTIVITAMINS) CHEW Chew 1 tablet by mouth daily.    . Omega-3 Fatty Acids (FISH OIL PO) Take 1 capsule by mouth daily.    . pravastatin (PRAVACHOL) 80 MG tablet Take 80 mg by mouth daily.     . vitamin B-12 (CYANOCOBALAMIN) 1000 MCG tablet Take 1,000 mcg by mouth daily.    Marland Kitchen warfarin (COUMADIN) 5 MG tablet TAKE 1 TO 1 AND 1/2 TABLET  BY MOUTH DAILY OR AS  DIRECTED 105 tablet 0   No current facility-administered medications for this visit.     Physical Exam: Vitals:   04/01/18 1509  BP: (!) 186/86  Pulse: (!) 50  SpO2: 97%  Weight: 197 lb 6.4 oz (89.5 kg)  Height: _0  (1.702 m)    GEN- The patient is well appearing, alert and oriented x 3 today.   Head- normocephalic, atraumatic Eyes-  Sclera clear, conjunctiva pink Ears- hearing intact Oropharynx- clear Lungs- Clear to ausculation bilaterally, normal work of breathing Heart- Regular rate and rhythm, no murmurs, rubs or gallops, PMI not laterally displaced GI- soft, NT, ND, + BS Extremities- no clubbing, cyanosis, +edema  Wt Readings from Last 3 Encounters:  04/01/18 197 lb 6.4 oz (89.5 kg)  07/01/16 195 lb (88.5 kg)  05/15/16 196 lb 6.4 oz (89.1 kg)    EKG tracing ordered today is personally reviewed and shows sinus rhythm 50  Assessment and Plan:  1. Paroxysmal atrial fibrillation  maintaining sinus rhythm On coumadin  2.  HTN Elevated today She did not take her medicine today No changes as she feels that her BP has been controlled at home  3. Sinus bradycardia Asymptomatic No changes  4. Venous insufficiency Stable No change required today Support hose  Return to see EP PA in 6 months  Thompson Grayer MD, [facility] 04/01/2018 3:26 PM

## 2018-04-11 DIAGNOSIS — S199XXA Unspecified injury of neck, initial encounter: Secondary | ICD-10-CM | POA: Diagnosis not present

## 2018-04-11 DIAGNOSIS — Z7901 Long term (current) use of anticoagulants: Secondary | ICD-10-CM | POA: Diagnosis not present

## 2018-04-11 DIAGNOSIS — S0990XA Unspecified injury of head, initial encounter: Secondary | ICD-10-CM | POA: Diagnosis not present

## 2018-04-11 DIAGNOSIS — W01198A Fall on same level from slipping, tripping and stumbling with subsequent striking against other object, initial encounter: Secondary | ICD-10-CM | POA: Diagnosis not present

## 2018-04-11 DIAGNOSIS — S60811A Abrasion of right wrist, initial encounter: Secondary | ICD-10-CM | POA: Diagnosis not present

## 2018-04-11 DIAGNOSIS — W19XXXA Unspecified fall, initial encounter: Secondary | ICD-10-CM | POA: Diagnosis not present

## 2018-04-11 DIAGNOSIS — Y92009 Unspecified place in unspecified non-institutional (private) residence as the place of occurrence of the external cause: Secondary | ICD-10-CM | POA: Diagnosis not present

## 2018-04-11 DIAGNOSIS — Y998 Other external cause status: Secondary | ICD-10-CM | POA: Diagnosis not present

## 2018-04-11 DIAGNOSIS — S0083XA Contusion of other part of head, initial encounter: Secondary | ICD-10-CM | POA: Diagnosis not present

## 2018-04-11 DIAGNOSIS — I10 Essential (primary) hypertension: Secondary | ICD-10-CM | POA: Diagnosis not present

## 2018-04-11 DIAGNOSIS — R51 Headache: Secondary | ICD-10-CM | POA: Diagnosis not present

## 2018-04-11 DIAGNOSIS — R42 Dizziness and giddiness: Secondary | ICD-10-CM | POA: Diagnosis not present

## 2018-04-21 ENCOUNTER — Ambulatory Visit: Payer: Medicare Other | Admitting: *Deleted

## 2018-04-21 DIAGNOSIS — Z5181 Encounter for therapeutic drug level monitoring: Secondary | ICD-10-CM | POA: Diagnosis not present

## 2018-04-21 DIAGNOSIS — I480 Paroxysmal atrial fibrillation: Secondary | ICD-10-CM | POA: Diagnosis not present

## 2018-04-21 LAB — POCT INR: INR: 2.9 (ref 2.0–3.0)

## 2018-04-21 NOTE — Patient Instructions (Signed)
Description   Continue taking 1 tablet daily except 1/2 tablet on Fridays.  Recheck INR in 4 weeks. Continue eating  your regular leafy green vegetables.  Call us any medication changes or concerns # [phone] [facility], Main # [phone].

## 2018-05-12 ENCOUNTER — Emergency Department (HOSPITAL_COMMUNITY): Payer: Medicare Other

## 2018-05-12 ENCOUNTER — Other Ambulatory Visit: Payer: Self-pay

## 2018-05-12 ENCOUNTER — Inpatient Hospital Stay (HOSPITAL_COMMUNITY)
Admission: EM | Admit: 2018-05-12 | Discharge: 2018-05-22 | DRG: 286 | Disposition: A | Discharge status: Rehabilitation Facility | Payer: Medicare Other | Attending: Neurology | Admitting: Neurology

## 2018-05-12 ENCOUNTER — Encounter (HOSPITAL_COMMUNITY): Payer: Self-pay

## 2018-05-12 DIAGNOSIS — I214 Non-ST elevation (NSTEMI) myocardial infarction: Secondary | ICD-10-CM | POA: Diagnosis present

## 2018-05-12 DIAGNOSIS — R34 Anuria and oliguria: Secondary | ICD-10-CM | POA: Diagnosis not present

## 2018-05-12 DIAGNOSIS — I4581 Long QT syndrome: Secondary | ICD-10-CM | POA: Diagnosis present

## 2018-05-12 DIAGNOSIS — I5181 Takotsubo syndrome: Secondary | ICD-10-CM | POA: Diagnosis not present

## 2018-05-12 DIAGNOSIS — I5189 Other ill-defined heart diseases: Secondary | ICD-10-CM | POA: Diagnosis not present

## 2018-05-12 DIAGNOSIS — R9431 Abnormal electrocardiogram [ECG] [EKG]: Secondary | ICD-10-CM | POA: Diagnosis not present

## 2018-05-12 DIAGNOSIS — D689 Coagulation defect, unspecified: Secondary | ICD-10-CM | POA: Diagnosis not present

## 2018-05-12 DIAGNOSIS — Z833 Family history of diabetes mellitus: Secondary | ICD-10-CM

## 2018-05-12 DIAGNOSIS — E1151 Type 2 diabetes mellitus with diabetic peripheral angiopathy without gangrene: Secondary | ICD-10-CM | POA: Diagnosis not present

## 2018-05-12 DIAGNOSIS — R7989 Other specified abnormal findings of blood chemistry: Secondary | ICD-10-CM

## 2018-05-12 DIAGNOSIS — E039 Hypothyroidism, unspecified: Secondary | ICD-10-CM | POA: Diagnosis not present

## 2018-05-12 DIAGNOSIS — I10 Essential (primary) hypertension: Secondary | ICD-10-CM | POA: Diagnosis present

## 2018-05-12 DIAGNOSIS — R1310 Dysphagia, unspecified: Secondary | ICD-10-CM | POA: Diagnosis not present

## 2018-05-12 DIAGNOSIS — R059 Cough, unspecified: Secondary | ICD-10-CM

## 2018-05-12 DIAGNOSIS — I21A1 Myocardial infarction type 2: Secondary | ICD-10-CM | POA: Diagnosis not present

## 2018-05-12 DIAGNOSIS — Z811 Family history of alcohol abuse and dependence: Secondary | ICD-10-CM

## 2018-05-12 DIAGNOSIS — I639 Cerebral infarction, unspecified: Secondary | ICD-10-CM

## 2018-05-12 DIAGNOSIS — I351 Nonrheumatic aortic (valve) insufficiency: Secondary | ICD-10-CM | POA: Diagnosis not present

## 2018-05-12 DIAGNOSIS — N179 Acute kidney failure, unspecified: Secondary | ICD-10-CM | POA: Diagnosis not present

## 2018-05-12 DIAGNOSIS — R4701 Aphasia: Secondary | ICD-10-CM | POA: Diagnosis not present

## 2018-05-12 DIAGNOSIS — D72829 Elevated white blood cell count, unspecified: Secondary | ICD-10-CM | POA: Diagnosis present

## 2018-05-12 DIAGNOSIS — J9690 Respiratory failure, unspecified, unspecified whether with hypoxia or hypercapnia: Secondary | ICD-10-CM

## 2018-05-12 DIAGNOSIS — W19XXXA Unspecified fall, initial encounter: Secondary | ICD-10-CM

## 2018-05-12 DIAGNOSIS — Z853 Personal history of malignant neoplasm of breast: Secondary | ICD-10-CM

## 2018-05-12 DIAGNOSIS — I5042 Chronic combined systolic (congestive) and diastolic (congestive) heart failure: Secondary | ICD-10-CM | POA: Diagnosis not present

## 2018-05-12 DIAGNOSIS — I6340 Cerebral infarction due to embolism of unspecified cerebral artery: Secondary | ICD-10-CM | POA: Diagnosis not present

## 2018-05-12 DIAGNOSIS — E785 Hyperlipidemia, unspecified: Secondary | ICD-10-CM | POA: Diagnosis not present

## 2018-05-12 DIAGNOSIS — C50911 Malignant neoplasm of unspecified site of right female breast: Secondary | ICD-10-CM | POA: Diagnosis not present

## 2018-05-12 DIAGNOSIS — Z8249 Family history of ischemic heart disease and other diseases of the circulatory system: Secondary | ICD-10-CM

## 2018-05-12 DIAGNOSIS — I480 Paroxysmal atrial fibrillation: Secondary | ICD-10-CM | POA: Diagnosis not present

## 2018-05-12 DIAGNOSIS — R51 Headache: Secondary | ICD-10-CM | POA: Diagnosis not present

## 2018-05-12 DIAGNOSIS — R29810 Facial weakness: Secondary | ICD-10-CM | POA: Diagnosis not present

## 2018-05-12 DIAGNOSIS — I63412 Cerebral infarction due to embolism of left middle cerebral artery: Secondary | ICD-10-CM

## 2018-05-12 DIAGNOSIS — Z9049 Acquired absence of other specified parts of digestive tract: Secondary | ICD-10-CM | POA: Diagnosis not present

## 2018-05-12 DIAGNOSIS — Z888 Allergy status to other drugs, medicaments and biological substances status: Secondary | ICD-10-CM

## 2018-05-12 DIAGNOSIS — I428 Other cardiomyopathies: Secondary | ICD-10-CM | POA: Diagnosis present

## 2018-05-12 DIAGNOSIS — W010XXA Fall on same level from slipping, tripping and stumbling without subsequent striking against object, initial encounter: Secondary | ICD-10-CM | POA: Diagnosis present

## 2018-05-12 DIAGNOSIS — Z79890 Hormone replacement therapy (postmenopausal): Secondary | ICD-10-CM

## 2018-05-12 DIAGNOSIS — Z7984 Long term (current) use of oral hypoglycemic drugs: Secondary | ICD-10-CM

## 2018-05-12 DIAGNOSIS — I420 Dilated cardiomyopathy: Secondary | ICD-10-CM | POA: Diagnosis not present

## 2018-05-12 DIAGNOSIS — E119 Type 2 diabetes mellitus without complications: Secondary | ICD-10-CM

## 2018-05-12 DIAGNOSIS — Z7901 Long term (current) use of anticoagulants: Secondary | ICD-10-CM

## 2018-05-12 DIAGNOSIS — I2510 Atherosclerotic heart disease of native coronary artery without angina pectoris: Secondary | ICD-10-CM | POA: Diagnosis not present

## 2018-05-12 DIAGNOSIS — Y92480 Sidewalk as the place of occurrence of the external cause: Secondary | ICD-10-CM

## 2018-05-12 DIAGNOSIS — E669 Obesity, unspecified: Secondary | ICD-10-CM | POA: Diagnosis present

## 2018-05-12 DIAGNOSIS — E782 Mixed hyperlipidemia: Secondary | ICD-10-CM | POA: Diagnosis present

## 2018-05-12 DIAGNOSIS — J040 Acute laryngitis: Secondary | ICD-10-CM | POA: Diagnosis not present

## 2018-05-12 DIAGNOSIS — G9341 Metabolic encephalopathy: Secondary | ICD-10-CM | POA: Diagnosis not present

## 2018-05-12 DIAGNOSIS — Z79899 Other long term (current) drug therapy: Secondary | ICD-10-CM

## 2018-05-12 DIAGNOSIS — Z4659 Encounter for fitting and adjustment of other gastrointestinal appliance and device: Secondary | ICD-10-CM

## 2018-05-12 DIAGNOSIS — I5022 Chronic systolic (congestive) heart failure: Secondary | ICD-10-CM | POA: Diagnosis not present

## 2018-05-12 DIAGNOSIS — D509 Iron deficiency anemia, unspecified: Secondary | ICD-10-CM | POA: Diagnosis present

## 2018-05-12 DIAGNOSIS — R778 Other specified abnormalities of plasma proteins: Secondary | ICD-10-CM

## 2018-05-12 DIAGNOSIS — J988 Other specified respiratory disorders: Secondary | ICD-10-CM | POA: Diagnosis not present

## 2018-05-12 DIAGNOSIS — Z818 Family history of other mental and behavioral disorders: Secondary | ICD-10-CM

## 2018-05-12 DIAGNOSIS — S0990XA Unspecified injury of head, initial encounter: Secondary | ICD-10-CM | POA: Diagnosis not present

## 2018-05-12 DIAGNOSIS — F329 Major depressive disorder, single episode, unspecified: Secondary | ICD-10-CM | POA: Diagnosis present

## 2018-05-12 DIAGNOSIS — I6602 Occlusion and stenosis of left middle cerebral artery: Secondary | ICD-10-CM | POA: Diagnosis not present

## 2018-05-12 DIAGNOSIS — M8580 Other specified disorders of bone density and structure, unspecified site: Secondary | ICD-10-CM | POA: Diagnosis not present

## 2018-05-12 DIAGNOSIS — R110 Nausea: Secondary | ICD-10-CM | POA: Diagnosis not present

## 2018-05-12 DIAGNOSIS — R42 Dizziness and giddiness: Secondary | ICD-10-CM | POA: Diagnosis not present

## 2018-05-12 DIAGNOSIS — C801 Malignant (primary) neoplasm, unspecified: Secondary | ICD-10-CM | POA: Diagnosis not present

## 2018-05-12 DIAGNOSIS — R05 Cough: Secondary | ICD-10-CM

## 2018-05-12 DIAGNOSIS — R29711 NIHSS score 11: Secondary | ICD-10-CM | POA: Diagnosis not present

## 2018-05-12 DIAGNOSIS — D638 Anemia in other chronic diseases classified elsewhere: Secondary | ICD-10-CM | POA: Diagnosis not present

## 2018-05-12 DIAGNOSIS — Z6834 Body mass index (BMI) 34.0-34.9, adult: Secondary | ICD-10-CM

## 2018-05-12 DIAGNOSIS — Z9011 Acquired absence of right breast and nipple: Secondary | ICD-10-CM

## 2018-05-12 DIAGNOSIS — R490 Dysphonia: Secondary | ICD-10-CM | POA: Diagnosis not present

## 2018-05-12 DIAGNOSIS — J9601 Acute respiratory failure with hypoxia: Secondary | ICD-10-CM | POA: Diagnosis not present

## 2018-05-12 DIAGNOSIS — Z880 Allergy status to penicillin: Secondary | ICD-10-CM

## 2018-05-12 DIAGNOSIS — D649 Anemia, unspecified: Secondary | ICD-10-CM | POA: Diagnosis present

## 2018-05-12 DIAGNOSIS — Z803 Family history of malignant neoplasm of breast: Secondary | ICD-10-CM

## 2018-05-12 DIAGNOSIS — J40 Bronchitis, not specified as acute or chronic: Secondary | ICD-10-CM | POA: Diagnosis not present

## 2018-05-12 DIAGNOSIS — J9600 Acute respiratory failure, unspecified whether with hypoxia or hypercapnia: Secondary | ICD-10-CM | POA: Diagnosis not present

## 2018-05-12 HISTORY — DX: Non-ST elevation (NSTEMI) myocardial infarction: I21.4

## 2018-05-12 HISTORY — DX: Cerebral infarction, unspecified: I63.9

## 2018-05-12 LAB — CBC WITH DIFFERENTIAL/PLATELET
Abs Immature Granulocytes: 0.02 10*3/uL (ref 0.00–0.07)
Basophils Absolute: 0.1 10*3/uL (ref 0.0–0.1)
Basophils Relative: 1 %
Eosinophils Absolute: 0.1 10*3/uL (ref 0.0–0.5)
Eosinophils Relative: 1 %
HCT: 32.7 % — ABNORMAL LOW (ref 36.0–46.0)
Hemoglobin: 10.2 g/dL — ABNORMAL LOW (ref 12.0–15.0)
Immature Granulocytes: 0 %
Lymphocytes Relative: 17 %
Lymphs Abs: 1.2 10*3/uL (ref 0.7–4.0)
MCH: 28.4 pg (ref 26.0–34.0)
MCHC: 31.2 g/dL (ref 30.0–36.0)
MCV: 91.1 fL (ref 80.0–100.0)
Monocytes Absolute: 0.4 10*3/uL (ref 0.1–1.0)
Monocytes Relative: 6 %
Neutro Abs: 5.6 10*3/uL (ref 1.7–7.7)
Neutrophils Relative %: 75 %
Platelets: 277 10*3/uL (ref 150–400)
RBC: 3.59 MIL/uL — ABNORMAL LOW (ref 3.87–5.11)
RDW: 14.8 % (ref 11.5–15.5)
WBC: 7.5 10*3/uL (ref 4.0–10.5)
nRBC: 0 % (ref 0.0–0.2)

## 2018-05-12 LAB — BASIC METABOLIC PANEL
Anion gap: 10 (ref 5–15)
BUN: 13 mg/dL (ref 8–23)
CO2: 22 mmol/L (ref 22–32)
Calcium: 9.2 mg/dL (ref 8.9–10.3)
Chloride: 102 mmol/L (ref 98–111)
Creatinine, Ser: 0.94 mg/dL (ref 0.44–1.00)
GFR calc Af Amer: >60 mL/min (ref >60)
GFR calc non Af Amer: 58 mL/min — ABNORMAL LOW (ref >60)
Glucose, Bld: 142 mg/dL — ABNORMAL HIGH (ref 70–99)
Potassium: 3.8 mmol/L (ref 3.5–5.1)
Sodium: 134 mmol/L — ABNORMAL LOW (ref 135–145)

## 2018-05-12 LAB — PROTIME-INR
INR: 2.69
Prothrombin Time: 28.2 seconds — ABNORMAL HIGH (ref 11.4–15.2)

## 2018-05-12 MED ORDER — MECLIZINE HCL 25 MG PO TABS
25.0000 mg | ORAL_TABLET | Freq: Once | ORAL | Status: AC
  Administered 2018-05-12: 25 mg via ORAL
  Filled 2018-05-12: qty 1

## 2018-05-12 MED ORDER — PROMETHAZINE HCL 25 MG/ML IJ SOLN
12.5000 mg | Freq: Once | INTRAMUSCULAR | Status: AC
  Administered 2018-05-12: 12.5 mg via INTRAVENOUS
  Filled 2018-05-12: qty 1

## 2018-05-12 MED ORDER — SODIUM CHLORIDE 0.9 % IV SOLN
INTRAVENOUS | Status: DC
  Administered 2018-05-13: 05:00:00 via INTRAVENOUS

## 2018-05-12 MED ORDER — ONDANSETRON HCL 4 MG/2ML IJ SOLN
INTRAMUSCULAR | Status: AC
  Filled 2018-05-12: qty 2

## 2018-05-12 MED ORDER — ONDANSETRON HCL 4 MG/2ML IJ SOLN
4.0000 mg | Freq: Once | INTRAMUSCULAR | Status: AC
  Administered 2018-05-12: 4 mg via INTRAVENOUS

## 2018-05-12 MED ORDER — MECLIZINE HCL 25 MG PO TABS
25.0000 mg | ORAL_TABLET | Freq: Once | ORAL | Status: AC
  Administered 2018-05-12: 25 mg via ORAL
  Filled 2018-05-12 (×2): qty 1

## 2018-05-12 MED ORDER — LORAZEPAM 2 MG/ML IJ SOLN
1.0000 mg | Freq: Once | INTRAMUSCULAR | Status: AC
  Administered 2018-05-12: 1 mg via INTRAVENOUS
  Filled 2018-05-12: qty 1

## 2018-05-12 NOTE — ED Notes (Signed)
Pt actively vomiting, IV phenergan orders received in place of PO antivert.

## 2018-05-12 NOTE — ED Triage Notes (Signed)
[place] ED from church with complaints of tripping on the sidewalk and falling, hitting her head when she landed since this evening. EMS reports pt is on coumadin for afib. Per EMS, no deformities noted, pt complaining of left-sided head pain of 3/10 at this time, pt is a&ox4. Pt placed in position of comfort with bed locked and lowered, call bell in reach.

## 2018-05-12 NOTE — ED Notes (Signed)
Patient transported to MRI 

## 2018-05-12 NOTE — ED Provider Notes (Signed)
[place] EMERGENCY DEPARTMENT Provider Note   CSN: [id number] Arrival date & time: 05/12/18  [place]     History   Chief Complaint Chief Complaint  Patient presents with  . Fall    HPI Gloria Joseph is a 77 y.o. female.  HPI   77 year old female presenting after fall.  She was on her way to church when she tripped on the sidewalk and fell.  She struck her left head.  No loss of consciousness.  Mild headache.  She is on Coumadin for history of atrial fibrillation.  Since being the emergency room she is developed what she is calling vertigo.  Room spinning sensation.  She has a history of the same.  She has felt nauseated with it and vomited here in the emergency room as well.  No acute neurologic complaints otherwise.  Past Medical History:  Diagnosis Date  . Anemia 12/18/2011  . Atrial fibrillation ([place]) 04/13/2011  . Cancer [facility])    breast CA s/p Right mastectomy  . Diabetes mellitus, type 2 (HCC)    diet controlled  . Hx of echocardiogram    Echocardiogram (12/15): Moderate LVH, EF 55-60%, normal wall motion, grade 1 diastolic dysfunction, aortic sclerosis without stenosis, mild AI, MAC  . Hyperlipidemia   . Hypertension    medicine refractory  . Hypothyroidism    s/p thryroid gland ablation  . Neuromuscular disorder ([place])   . Osteopenia   . Renal insufficiency, mild 09/23/2013    Patient Active Problem List   Diagnosis Date Noted  . Encounter for therapeutic drug monitoring 05/03/2014  . Obesity (BMI 30-39.9) 12/08/2013  . Renal insufficiency, mild 09/23/2013  . Recurrent falls 09/23/2013  . PAF (paroxysmal atrial fibrillation) ([place]) 12/07/2012  . Anemia 12/18/2011  . Vitamin B 12 deficiency 05/27/2011  . QT prolongation 04/13/2011  . Bradycardia 04/11/2011  . Syncopal episodes 04/11/2011  . HYPOTHYROIDISM 07/04/2010  . EDEMA- LOCALIZED 07/04/2010  . VITAMIN D DEFICIENCY 03/01/2009  . INCONTINENCE, URGE 01/07/2008  . Type II or  unspecified type diabetes mellitus with renal manifestations, not stated as uncontrolled(250.40) 06/22/2007  . HYPERLIPIDEMIA 06/22/2007  . FATIGUE 06/22/2007  . Essential hypertension 06/24/2006  . OSTEOPENIA 06/24/2006    Past Surgical History:  Procedure Laterality Date  . BREAST ENHANCEMENT SURGERY    . BREAST IMPLANT REMOVAL  2000   Unilaterally   . BREAST SURGERY    . CHOLECYSTECTOMY    . COLONOSCOPY  2002   Neg  . MASTECTOMY    . RAI  04/04/2006  . TUBAL LIGATION       OB History   No obstetric history on file.      Home Medications    Prior to Admission medications   Medication Sig Start Date End Date Taking? Authorizing Provider  CALCIUM-MAGNESIUM-ZINC PO Take 1 capsule by mouth daily.    [provider]  carvedilol (COREG) 3.125 MG tablet Take 1 tablet by mouth 2 (two) times daily. 02/17/18   [provider]  Cholecalciferol (VITAMIN D-3) 1000 UNITS CAPS Take 1 capsule by mouth daily.    [provider]  citalopram (CELEXA) 20 MG tablet Take 20 mg by mouth daily.    [provider]  Coenzyme Q10 (CO Q 10 PO) Take 1 capsule by mouth daily.    [provider]  Flaxseed, Linseed, (FLAXSEED OIL PO) Take 1 tablet by mouth daily.    [provider]  hydrALAZINE (APRESOLINE) 100 MG tablet Take 150 mg by mouth  2 (two) times daily.    [provider]  levothyroxine (SYNTHROID, LEVOTHROID) 112 MCG tablet Take 1 tablet (112 mcg) by mouth daily. EXCEPT Tuesdays and Thursdays take 1/2 tablet (56 mcg) by mouth daily. 04/24/16   [provider]  losartan (COZAAR) 100 MG tablet Take 100 mg by mouth daily.    [provider]  meclizine (ANTIVERT) 25 MG tablet Take 25 mg by mouth 2 (two) times daily as needed for dizziness. Taking 1/2 tablet PRN QD    [provider]  metFORMIN (GLUCOPHAGE) 1000 MG tablet Take 500 mg by mouth 2 (two) times daily. 02/04/16   [provider]  Multiple  Vitamins-Minerals (MULTIVITAMINS) CHEW Chew 1 tablet by mouth daily.    [provider]  Omega-3 Fatty Acids (FISH OIL PO) Take 1 capsule by mouth daily.    [provider]  pravastatin (PRAVACHOL) 80 MG tablet Take 80 mg by mouth daily.  03/22/14   [provider]  vitamin B-12 (CYANOCOBALAMIN) 1000 MCG tablet Take 1,000 mcg by mouth daily.    [provider]  warfarin (COUMADIN) 5 MG tablet TAKE 1 TO 1 AND 1/2 TABLET  BY MOUTH DAILY OR AS  DIRECTED 02/17/18   Thompson Grayer, MD    Family History Family History  Problem Relation Age of Onset  . Depression Mother   . Alcohol abuse Father   . Heart attack Brother 63  . Cancer Other        Uncle, not specific  . Cancer Other        Aunt, not specific. GYN CA  . Breast cancer Other        Aunt, not specific  . Diabetes Other        Grandmother, not specific  . Cancer Maternal Grandfather        colon    Social History Social History   Tobacco Use  . Smoking status: Never Smoker  . Smokeless tobacco: Never Used  Substance Use Topics  . Alcohol use: No  . Drug use: No     Allergies   Lisinopril; Hctz [hydrochlorothiazide]; Spironolactone; Penicillins; and Amlodipine besylate   Review of Systems Review of Systems All systems reviewed and negative, other than as noted in HPI.   Physical Exam Updated Vital Signs Ht 5' 7"  (1.702 m)   Wt 89.5 kg   BMI 30.90 kg/m   Physical Exam Vitals signs and nursing note reviewed.  Constitutional:      General: She is not in acute distress.    Appearance: She is well-developed.  HENT:     Head: Normocephalic and atraumatic.  Eyes:     General:        Right eye: No discharge.        Left eye: No discharge.     Conjunctiva/sclera: Conjunctivae normal.     Comments: Horizontal nystagmus  Neck:     Musculoskeletal: Neck supple.  Cardiovascular:     Rate and Rhythm: Normal rate and regular rhythm.     Heart sounds: Normal heart sounds. No  murmur. No friction rub. No gallop.   Pulmonary:     Effort: Pulmonary effort is normal. No respiratory distress.     Breath sounds: Normal breath sounds.  Abdominal:     General: There is no distension.     Palpations: Abdomen is soft.     Tenderness: There is no abdominal tenderness.  Musculoskeletal:        General: No tenderness.  Comments: No midline spinal tenderness  Skin:    General: Skin is warm and dry.  Neurological:     General: No focal deficit present.     Mental Status: She is alert and oriented to person, place, and time. Mental status is at baseline.     Cranial Nerves: No cranial nerve deficit.     Sensory: No sensory deficit.     Motor: No weakness.     Comments: Good finger-to-nose bilaterally.  Very unsteady when just trying to sit up in bed or trying to stand at bedside.  Psychiatric:        Behavior: Behavior normal.        Thought Content: Thought content normal.      ED Treatments / Results  Labs (all labs ordered are listed, but only abnormal results are displayed) Labs Reviewed  PROTIME-INR - Abnormal; Notable for the following components:      Result Value   Prothrombin Time 28.2 (*)    All other components within normal limits  CBC WITH DIFFERENTIAL/PLATELET - Abnormal; Notable for the following components:   RBC 3.59 (*)    Hemoglobin 10.2 (*)    HCT 32.7 (*)    All other components within normal limits  BASIC METABOLIC PANEL - Abnormal; Notable for the following components:   Sodium 134 (*)    Glucose, Bld 142 (*)    GFR calc non Af Amer 58 (*)    All other components within normal limits    EKG EKG Interpretation  Date/Time:  Tuesday May 12 2018 18:48:03 EST Ventricular Rate:  93 PR Interval:    QRS Duration: 110 QT Interval:  395 QTC Calculation: 492 R Axis:   -3 Text Interpretation:  Unknown rhythm, irregular rate Minimal ST depression, lateral leads Borderline prolonged QT interval Confirmed by Virgel Manifold [phone])  on 05/12/2018 7:05:25 PM   Radiology Ct Head Wo Contrast  Result Date: 05/12/2018 CLINICAL DATA:  Recent trip and fall with headaches EXAM: CT HEAD WITHOUT CONTRAST TECHNIQUE: Contiguous axial images were obtained from the base of the skull through the vertex without intravenous contrast. COMPARISON:  04/11/2018 FINDINGS: Brain: Mild chronic white matter ischemic changes are seen. No findings to suggest acute hemorrhage, acute infarction or space-occupying mass lesion are noted. Basal ganglia calcifications are again noted. Vascular: No hyperdense vessel or unexpected calcification. Skull: Normal. Negative for fracture or focal lesion. Sinuses/Orbits: No acute finding. Other: None. IMPRESSION: Chronic white matter ischemic changes without acute abnormality. Electronically Signed   By: Inez Catalina M.D.   On: 05/12/2018 20:50    Procedures Procedures (including critical care time)  Medications Ordered in ED Medications - No data to display   Initial Impression / Assessment and Plan / ED Course  I have reviewed the triage vital signs and the nursing notes.  Pertinent labs & imaging results that were available during my care of the patient were reviewed by me and considered in my medical decision making (see chart for details).     8:25 PM Pt having persistent n/v. CT called to expedite getting her over to the scanner.   77 year old female with mechanical fall on Coumadin.  CT the head negative.  INR is within therapeutic range.  She has had persistent symptoms consistent with vertigo.  She has a history of the same.  She is treated with Zofran, Phenergan, Ativan, meclizine.  She still remains very symptomatic.  Her neuro exam is nonfocal otherwise.  We try to get her up  to ambulate more than once at her medications to no avail.  Will MRI to rule out central etiology although I doubt.  Care signed out to oncoming provider.  Final Clinical Impressions(s) / ED Diagnoses   Final diagnoses:    Fall, initial encounter  Anticoagulated on Coumadin  Vertigo    ED Discharge Orders    None       Virgel Manifold, MD 05/12/18 2340

## 2018-05-12 NOTE — ED Notes (Signed)
Patient suddenly slurring speech, alerted physician

## 2018-05-12 NOTE — ED Notes (Signed)
Pt unable to tolerate sitting up due to dizziness

## 2018-05-13 ENCOUNTER — Inpatient Hospital Stay (HOSPITAL_COMMUNITY): Payer: Medicare Other

## 2018-05-13 ENCOUNTER — Encounter (HOSPITAL_COMMUNITY): Payer: Self-pay | Admitting: Internal Medicine

## 2018-05-13 DIAGNOSIS — D638 Anemia in other chronic diseases classified elsewhere: Secondary | ICD-10-CM | POA: Diagnosis present

## 2018-05-13 DIAGNOSIS — I5042 Chronic combined systolic (congestive) and diastolic (congestive) heart failure: Secondary | ICD-10-CM | POA: Diagnosis not present

## 2018-05-13 DIAGNOSIS — C801 Malignant (primary) neoplasm, unspecified: Secondary | ICD-10-CM | POA: Diagnosis not present

## 2018-05-13 DIAGNOSIS — I6523 Occlusion and stenosis of bilateral carotid arteries: Secondary | ICD-10-CM | POA: Diagnosis not present

## 2018-05-13 DIAGNOSIS — I5189 Other ill-defined heart diseases: Secondary | ICD-10-CM | POA: Diagnosis not present

## 2018-05-13 DIAGNOSIS — R4701 Aphasia: Secondary | ICD-10-CM | POA: Diagnosis not present

## 2018-05-13 DIAGNOSIS — F329 Major depressive disorder, single episode, unspecified: Secondary | ICD-10-CM | POA: Diagnosis present

## 2018-05-13 DIAGNOSIS — E782 Mixed hyperlipidemia: Secondary | ICD-10-CM | POA: Diagnosis not present

## 2018-05-13 DIAGNOSIS — R34 Anuria and oliguria: Secondary | ICD-10-CM | POA: Diagnosis not present

## 2018-05-13 DIAGNOSIS — I214 Non-ST elevation (NSTEMI) myocardial infarction: Secondary | ICD-10-CM | POA: Diagnosis not present

## 2018-05-13 DIAGNOSIS — I639 Cerebral infarction, unspecified: Secondary | ICD-10-CM | POA: Diagnosis not present

## 2018-05-13 DIAGNOSIS — K449 Diaphragmatic hernia without obstruction or gangrene: Secondary | ICD-10-CM | POA: Diagnosis not present

## 2018-05-13 DIAGNOSIS — W19XXXA Unspecified fall, initial encounter: Secondary | ICD-10-CM | POA: Diagnosis not present

## 2018-05-13 DIAGNOSIS — R42 Dizziness and giddiness: Secondary | ICD-10-CM

## 2018-05-13 DIAGNOSIS — J90 Pleural effusion, not elsewhere classified: Secondary | ICD-10-CM | POA: Diagnosis not present

## 2018-05-13 DIAGNOSIS — M8580 Other specified disorders of bone density and structure, unspecified site: Secondary | ICD-10-CM | POA: Diagnosis present

## 2018-05-13 DIAGNOSIS — Z9011 Acquired absence of right breast and nipple: Secondary | ICD-10-CM | POA: Diagnosis not present

## 2018-05-13 DIAGNOSIS — E785 Hyperlipidemia, unspecified: Secondary | ICD-10-CM | POA: Diagnosis present

## 2018-05-13 DIAGNOSIS — Z853 Personal history of malignant neoplasm of breast: Secondary | ICD-10-CM | POA: Diagnosis not present

## 2018-05-13 DIAGNOSIS — I351 Nonrheumatic aortic (valve) insufficiency: Secondary | ICD-10-CM

## 2018-05-13 DIAGNOSIS — I5181 Takotsubo syndrome: Secondary | ICD-10-CM | POA: Diagnosis not present

## 2018-05-13 DIAGNOSIS — I5022 Chronic systolic (congestive) heart failure: Secondary | ICD-10-CM

## 2018-05-13 DIAGNOSIS — R9431 Abnormal electrocardiogram [ECG] [EKG]: Secondary | ICD-10-CM | POA: Diagnosis present

## 2018-05-13 DIAGNOSIS — I428 Other cardiomyopathies: Secondary | ICD-10-CM | POA: Diagnosis not present

## 2018-05-13 DIAGNOSIS — R110 Nausea: Secondary | ICD-10-CM | POA: Diagnosis not present

## 2018-05-13 DIAGNOSIS — E039 Hypothyroidism, unspecified: Secondary | ICD-10-CM | POA: Diagnosis not present

## 2018-05-13 DIAGNOSIS — I4581 Long QT syndrome: Secondary | ICD-10-CM | POA: Diagnosis present

## 2018-05-13 DIAGNOSIS — R778 Other specified abnormalities of plasma proteins: Secondary | ICD-10-CM

## 2018-05-13 DIAGNOSIS — I21A1 Myocardial infarction type 2: Secondary | ICD-10-CM | POA: Diagnosis not present

## 2018-05-13 DIAGNOSIS — S0990XA Unspecified injury of head, initial encounter: Secondary | ICD-10-CM | POA: Diagnosis not present

## 2018-05-13 DIAGNOSIS — I2510 Atherosclerotic heart disease of native coronary artery without angina pectoris: Secondary | ICD-10-CM | POA: Diagnosis present

## 2018-05-13 DIAGNOSIS — E119 Type 2 diabetes mellitus without complications: Secondary | ICD-10-CM | POA: Diagnosis not present

## 2018-05-13 DIAGNOSIS — Y92480 Sidewalk as the place of occurrence of the external cause: Secondary | ICD-10-CM | POA: Diagnosis not present

## 2018-05-13 DIAGNOSIS — E669 Obesity, unspecified: Secondary | ICD-10-CM | POA: Diagnosis present

## 2018-05-13 DIAGNOSIS — Z7901 Long term (current) use of anticoagulants: Secondary | ICD-10-CM | POA: Insufficient documentation

## 2018-05-13 DIAGNOSIS — J988 Other specified respiratory disorders: Secondary | ICD-10-CM | POA: Diagnosis not present

## 2018-05-13 DIAGNOSIS — R05 Cough: Secondary | ICD-10-CM | POA: Diagnosis not present

## 2018-05-13 DIAGNOSIS — I10 Essential (primary) hypertension: Secondary | ICD-10-CM | POA: Diagnosis not present

## 2018-05-13 DIAGNOSIS — I6340 Cerebral infarction due to embolism of unspecified cerebral artery: Secondary | ICD-10-CM | POA: Diagnosis not present

## 2018-05-13 DIAGNOSIS — R918 Other nonspecific abnormal finding of lung field: Secondary | ICD-10-CM | POA: Diagnosis not present

## 2018-05-13 DIAGNOSIS — N179 Acute kidney failure, unspecified: Secondary | ICD-10-CM | POA: Diagnosis not present

## 2018-05-13 DIAGNOSIS — D689 Coagulation defect, unspecified: Secondary | ICD-10-CM | POA: Diagnosis not present

## 2018-05-13 DIAGNOSIS — R7989 Other specified abnormal findings of blood chemistry: Secondary | ICD-10-CM | POA: Diagnosis not present

## 2018-05-13 DIAGNOSIS — E1151 Type 2 diabetes mellitus with diabetic peripheral angiopathy without gangrene: Secondary | ICD-10-CM | POA: Diagnosis present

## 2018-05-13 DIAGNOSIS — I6602 Occlusion and stenosis of left middle cerebral artery: Secondary | ICD-10-CM | POA: Diagnosis not present

## 2018-05-13 DIAGNOSIS — W010XXA Fall on same level from slipping, tripping and stumbling without subsequent striking against object, initial encounter: Secondary | ICD-10-CM | POA: Diagnosis present

## 2018-05-13 DIAGNOSIS — Z6834 Body mass index (BMI) 34.0-34.9, adult: Secondary | ICD-10-CM | POA: Diagnosis not present

## 2018-05-13 DIAGNOSIS — I480 Paroxysmal atrial fibrillation: Secondary | ICD-10-CM | POA: Diagnosis not present

## 2018-05-13 DIAGNOSIS — I63412 Cerebral infarction due to embolism of left middle cerebral artery: Secondary | ICD-10-CM | POA: Diagnosis not present

## 2018-05-13 DIAGNOSIS — Z9049 Acquired absence of other specified parts of digestive tract: Secondary | ICD-10-CM | POA: Diagnosis not present

## 2018-05-13 DIAGNOSIS — J9601 Acute respiratory failure with hypoxia: Secondary | ICD-10-CM | POA: Diagnosis not present

## 2018-05-13 DIAGNOSIS — R29818 Other symptoms and signs involving the nervous system: Secondary | ICD-10-CM | POA: Diagnosis not present

## 2018-05-13 DIAGNOSIS — G9341 Metabolic encephalopathy: Secondary | ICD-10-CM | POA: Diagnosis present

## 2018-05-13 DIAGNOSIS — D72829 Elevated white blood cell count, unspecified: Secondary | ICD-10-CM | POA: Diagnosis not present

## 2018-05-13 HISTORY — DX: Non-ST elevation (NSTEMI) myocardial infarction: I21.4

## 2018-05-13 LAB — LIPID PANEL
CHOL/HDL RATIO: 2.5 ratio
Cholesterol: 116 mg/dL (ref 0–200)
HDL: 46 mg/dL (ref >40)
LDL Cholesterol: 49 mg/dL (ref 0–99)
Triglycerides: 103 mg/dL (ref <150)
VLDL: 21 mg/dL (ref 0–40)

## 2018-05-13 LAB — CBC
HCT: 29.8 % — ABNORMAL LOW (ref 36.0–46.0)
Hemoglobin: 9.4 g/dL — ABNORMAL LOW (ref 12.0–15.0)
MCH: 28 pg (ref 26.0–34.0)
MCHC: 31.5 g/dL (ref 30.0–36.0)
MCV: 88.7 fL (ref 80.0–100.0)
Platelets: 274 10*3/uL (ref 150–400)
RBC: 3.36 MIL/uL — ABNORMAL LOW (ref 3.87–5.11)
RDW: 14.6 % (ref 11.5–15.5)
WBC: 5.6 10*3/uL (ref 4.0–10.5)
nRBC: 0 % (ref 0.0–0.2)

## 2018-05-13 LAB — TROPONIN I
TROPONIN I: 1.15 ng/mL — AB (ref <0.03)
Troponin I: 1 ng/mL (ref <0.03)
Troponin I: 1.46 ng/mL (ref <0.03)

## 2018-05-13 LAB — BASIC METABOLIC PANEL
Anion gap: 12 (ref 5–15)
BUN: 12 mg/dL (ref 8–23)
CO2: 24 mmol/L (ref 22–32)
CREATININE: 0.96 mg/dL (ref 0.44–1.00)
Calcium: 8.8 mg/dL — ABNORMAL LOW (ref 8.9–10.3)
Chloride: 100 mmol/L (ref 98–111)
GFR calc Af Amer: >60 mL/min (ref >60)
GFR calc non Af Amer: 57 mL/min — ABNORMAL LOW (ref >60)
Glucose, Bld: 108 mg/dL — ABNORMAL HIGH (ref 70–99)
Potassium: 3.5 mmol/L (ref 3.5–5.1)
Sodium: 136 mmol/L (ref 135–145)

## 2018-05-13 LAB — GLUCOSE, CAPILLARY
GLUCOSE-CAPILLARY: 169 mg/dL — AB (ref 70–99)
Glucose-Capillary: 103 mg/dL — ABNORMAL HIGH (ref 70–99)
Glucose-Capillary: 140 mg/dL — ABNORMAL HIGH (ref 70–99)
Glucose-Capillary: 142 mg/dL — ABNORMAL HIGH (ref 70–99)

## 2018-05-13 LAB — ECHOCARDIOGRAM COMPLETE
Height: 67 in
Weight: 3372.16 oz

## 2018-05-13 LAB — PROTIME-INR
INR: 2.43
Prothrombin Time: 26.1 seconds — ABNORMAL HIGH (ref 11.4–15.2)

## 2018-05-13 MED ORDER — CARVEDILOL 3.125 MG PO TABS
3.1250 mg | ORAL_TABLET | Freq: Two times a day (BID) | ORAL | Status: DC
  Administered 2018-05-13 – 2018-05-22 (×12): 3.125 mg via ORAL
  Filled 2018-05-13 (×14): qty 1

## 2018-05-13 MED ORDER — PRAVASTATIN SODIUM 40 MG PO TABS: 80.0000 mg | ORAL_TABLET | Freq: Every day | ORAL | Status: DC

## 2018-05-13 MED ORDER — VITAMIN B-12 1000 MCG PO TABS
1000.0000 ug | ORAL_TABLET | Freq: Every day | ORAL | Status: DC
  Administered 2018-05-13 – 2018-05-22 (×7): 1000 ug via ORAL
  Filled 2018-05-13 (×10): qty 1

## 2018-05-13 MED ORDER — MECLIZINE HCL 12.5 MG PO TABS
12.5000 mg | ORAL_TABLET | Freq: Three times a day (TID) | ORAL | Status: DC | PRN
  Filled 2018-05-13: qty 1

## 2018-05-13 MED ORDER — CO Q 10 10 MG PO CAPS: ORAL_CAPSULE | Freq: Every day | ORAL | Status: DC

## 2018-05-13 MED ORDER — MORPHINE SULFATE (PF) 2 MG/ML IV SOLN: 0.5000 mg | INTRAVENOUS | Status: DC | PRN

## 2018-05-13 MED ORDER — ACETAMINOPHEN 325 MG PO TABS: 650.0000 mg | ORAL_TABLET | Freq: Four times a day (QID) | ORAL | Status: DC | PRN

## 2018-05-13 MED ORDER — LOSARTAN POTASSIUM 50 MG PO TABS
100.0000 mg | ORAL_TABLET | Freq: Every day | ORAL | Status: DC
  Administered 2018-05-13 – 2018-05-14 (×2): 100 mg via ORAL
  Filled 2018-05-13 (×2): qty 2

## 2018-05-13 MED ORDER — HYDRALAZINE HCL 50 MG PO TABS
150.0000 mg | ORAL_TABLET | Freq: Two times a day (BID) | ORAL | Status: DC
  Administered 2018-05-13 – 2018-05-15 (×5): 150 mg via ORAL
  Filled 2018-05-13 (×5): qty 3

## 2018-05-13 MED ORDER — INSULIN ASPART 100 UNIT/ML ~~LOC~~ SOLN: 0.0000 [IU] | Freq: Every day | SUBCUTANEOUS | Status: DC

## 2018-05-13 MED ORDER — VITAMIN D 25 MCG (1000 UNIT) PO TABS
1000.0000 [IU] | ORAL_TABLET | Freq: Every day | ORAL | Status: DC
  Administered 2018-05-13 – 2018-05-22 (×8): 1000 [IU] via ORAL
  Filled 2018-05-13 (×9): qty 1

## 2018-05-13 MED ORDER — NITROGLYCERIN 0.4 MG SL SUBL: 0.4000 mg | SUBLINGUAL_TABLET | SUBLINGUAL | Status: DC | PRN

## 2018-05-13 MED ORDER — LEVOTHYROXINE SODIUM 112 MCG PO TABS
112.0000 ug | ORAL_TABLET | Freq: Every day | ORAL | Status: DC
  Administered 2018-05-13 – 2018-05-22 (×8): 112 ug via ORAL
  Filled 2018-05-13 (×9): qty 1

## 2018-05-13 MED ORDER — ADULT MULTIVITAMIN W/MINERALS CH
1.0000 | ORAL_TABLET | Freq: Every day | ORAL | Status: DC
  Administered 2018-05-13 – 2018-05-22 (×8): 1 via ORAL
  Filled 2018-05-13 (×9): qty 1

## 2018-05-13 MED ORDER — HYDRALAZINE HCL 20 MG/ML IJ SOLN
5.0000 mg | INTRAMUSCULAR | Status: DC | PRN
  Administered 2018-05-17: 5 mg via INTRAVENOUS
  Filled 2018-05-13 (×3): qty 1

## 2018-05-13 MED ORDER — INSULIN ASPART 100 UNIT/ML ~~LOC~~ SOLN
0.0000 [IU] | Freq: Three times a day (TID) | SUBCUTANEOUS | Status: DC
  Administered 2018-05-13 – 2018-05-14 (×3): 1 [IU] via SUBCUTANEOUS

## 2018-05-13 MED ORDER — HEPARIN (PORCINE) 25000 UT/250ML-% IV SOLN
1000.0000 [IU]/h | INTRAVENOUS | Status: DC
  Filled 2018-05-13: qty 250

## 2018-05-13 MED ORDER — ACETAMINOPHEN 650 MG RE SUPP: 650.0000 mg | Freq: Four times a day (QID) | RECTAL | Status: DC | PRN

## 2018-05-13 MED ORDER — OMEGA-3-ACID ETHYL ESTERS 1 G PO CAPS
1.0000 g | ORAL_CAPSULE | Freq: Every day | ORAL | Status: DC
  Administered 2018-05-13 – 2018-05-22 (×8): 1 g via ORAL
  Filled 2018-05-13 (×9): qty 1

## 2018-05-13 MED ORDER — CITALOPRAM HYDROBROMIDE 10 MG PO TABS
20.0000 mg | ORAL_TABLET | Freq: Every day | ORAL | Status: DC
  Administered 2018-05-13 – 2018-05-22 (×9): 20 mg via ORAL
  Filled 2018-05-13 (×4): qty 2
  Filled 2018-05-13 (×2): qty 1
  Filled 2018-05-13: qty 2
  Filled 2018-05-13: qty 1
  Filled 2018-05-13: qty 2

## 2018-05-13 MED ORDER — ATORVASTATIN CALCIUM 80 MG PO TABS
80.0000 mg | ORAL_TABLET | Freq: Every day | ORAL | Status: DC
  Administered 2018-05-13 – 2018-05-16 (×3): 80 mg via ORAL
  Filled 2018-05-13 (×3): qty 1

## 2018-05-13 MED ORDER — ONDANSETRON HCL 4 MG PO TABS
4.0000 mg | ORAL_TABLET | Freq: Four times a day (QID) | ORAL | Status: DC | PRN
  Administered 2018-05-13: 4 mg via ORAL
  Filled 2018-05-13: qty 1

## 2018-05-13 MED ORDER — ONDANSETRON HCL 4 MG/2ML IJ SOLN
4.0000 mg | Freq: Four times a day (QID) | INTRAMUSCULAR | Status: DC | PRN
  Administered 2018-05-18: 4 mg via INTRAVENOUS
  Filled 2018-05-13: qty 2

## 2018-05-13 MED ORDER — FLAXSEED OIL 1000 MG PO CAPS: ORAL_CAPSULE | Freq: Every day | ORAL | Status: DC

## 2018-05-13 NOTE — Plan of Care (Signed)

## 2018-05-13 NOTE — Progress Notes (Signed)
  Echocardiogram 2D Echocardiogram has been performed.  Marybelle Killings 05/13/2018, 10:55 AM

## 2018-05-13 NOTE — H&P (Addendum)
History and Physical    Gloria Joseph YBO:[id number] DOB: 1940-09-29 DOA: 05/12/2018  Referring MD/NP/PA:   PCP: Jani Gravel, MD   Patient coming from:  The patient is coming from home.  At baseline, pt is independent for most of ADL.        Chief Complaint: fall  HPI: Gloria Joseph is a 77 y.o. female with medical history significant of hypertension, hyperlipidemia, diabetes mellitus, hypothyroidism, depression, atrial fibrillation on Coumadin, anemia, breast cancer (right mastectomy), who presents with fall.  Pt reported to EDP that she was on her way to church when she tripped on the sidewalk and fell. She hit her head, causing mild left side headache. No LOC. No unilateral weakness in the extremities. In ED, pt developed what she is calling vertigo. She has room spinning sensation.  She has history of the same. She has nausea and vomited in ED. She was given 1 mg of Ativan, 12.5 mg of Phenergan and meclizine 25 mg x 2 in the ED.  When I saw patient in the ED, she is sedated and confused, but arousable.  She is oriented x3 when is aroused. Her talk is vague. No facial droop noted. She moves all extremities.  She denies chest pain or abdominal pain.  Currently no active nausea vomiting, diarrhea notated.  No cough or respiratory distress noted.  ED Course: pt was found to have WBC 7.5, INR 2.69, no fever, no tachycardia, no tachypnea, oxygen saturation 94% on room air, electrolytes renal function okay, chest x-ray showed cardiomegaly without infiltration, CT head is negative for acute intracranial abnormalities.  MRI of her brain has motion degradation, but is negative for acute issues.  EKG showed T wave inversion in lateral leads and V5-V6, which is new.  Patient is placed on telemetry bed for observation.  Review of Systems:   General: no fevers, chills, no body weight gain, has fatigue, headache. HEENT: no blurry vision, hearing changes or sore throat Respiratory: no dyspnea,  coughing, wheezing CV: no chest pain, no palpitations GI: Has nausea, vomiting, no abdominal pain, diarrhea, constipation GU: no dysuria, burning on urination, increased urinary frequency, hematuria  Ext: no leg edema Neuro: no unilateral weakness, numbness, or tingling, no vision change or hearing loss.  Has fall and vertigo Skin: no rash, no skin tear. MSK: No muscle spasm, no deformity, no limitation of range of movement in spin Heme: No easy bruising.  Travel history: No recent long distant travel.  Allergy:  Allergies  Allergen Reactions  . Lisinopril     ? Angioedema (NOT ARB candidate) Because of a history of documented adverse serious drug reaction;Medi Alert bracelet  is recommended  . Hctz [Hydrochlorothiazide]     hyponatremia  . Spironolactone     REACTION: low potassium ???  . Penicillins Other (See Comments)    Pt states medication doesn't work for her d/t her usage of it several years ago.  . Amlodipine Besylate     REACTION: edema    Past Medical History:  Diagnosis Date  . Anemia 12/18/2011  . Atrial fibrillation ([place]) 04/13/2011  . Cancer [facility])    breast CA s/p Right mastectomy  . Diabetes mellitus, type 2 (HCC)    diet controlled  . Hx of echocardiogram    Echocardiogram (12/15): Moderate LVH, EF 55-60%, normal wall motion, grade 1 diastolic dysfunction, aortic sclerosis without stenosis, mild AI, MAC  . Hyperlipidemia   . Hypertension    medicine refractory  . Hypothyroidism  s/p thryroid gland ablation  . Neuromuscular disorder ([place])   . NSTEMI (non-ST elevated myocardial infarction) ([place]) 05/13/2018  . Osteopenia   . Renal insufficiency, mild 09/23/2013    Past Surgical History:  Procedure Laterality Date  . BREAST ENHANCEMENT SURGERY    . BREAST IMPLANT REMOVAL  2000   Unilaterally   . BREAST SURGERY    . CHOLECYSTECTOMY    . COLONOSCOPY  2002   Neg  . MASTECTOMY    . RAI  04/04/2006  . TUBAL LIGATION      Social History:  reports  that she has never smoked. She has never used smokeless tobacco. She reports that she does not drink alcohol or use drugs.  Family History:  Family History  Problem Relation Age of Onset  . Depression Mother   . Alcohol abuse Father   . Heart attack Brother 47  . Cancer Other        Uncle, not specific  . Cancer Other        Aunt, not specific. GYN CA  . Breast cancer Other        Aunt, not specific  . Diabetes Other        Grandmother, not specific  . Cancer Maternal Grandfather        colon     Prior to Admission medications   Medication Sig Start Date End Date Taking? Authorizing Provider  carvedilol (COREG) 3.125 MG tablet Take 1 tablet by mouth 2 (two) times daily. 02/17/18  Yes [provider]  Cholecalciferol (VITAMIN D-3) 1000 UNITS CAPS Take 1 capsule by mouth daily.   Yes [provider]  citalopram (CELEXA) 20 MG tablet Take 20 mg by mouth daily.   Yes [provider]  Coenzyme Q10 (CO Q 10 PO) Take 1 capsule by mouth daily.   Yes [provider]  Flaxseed, Linseed, (FLAXSEED OIL PO) Take 1 tablet by mouth daily.   Yes [provider]  hydrALAZINE (APRESOLINE) 100 MG tablet Take 150 mg by mouth 2 (two) times daily.   Yes [provider]  levothyroxine (SYNTHROID, LEVOTHROID) 112 MCG tablet Take 1 tablet (112 mcg) by mouth daily. EXCEPT Tuesdays and Thursdays take 1/2 tablet (56 mcg) by mouth daily. 04/24/16  Yes [provider]  losartan (COZAAR) 100 MG tablet Take 100 mg by mouth daily.   Yes [provider]  metFORMIN (GLUCOPHAGE) 1000 MG tablet Take 500 mg by mouth 2 (two) times daily. 02/04/16  Yes [provider]  Multiple Vitamins-Minerals (MULTIVITAMINS) CHEW Chew 1 tablet by mouth daily.   Yes [provider]  Omega-3 Fatty Acids (FISH OIL PO) Take 1 capsule by mouth daily.   Yes [provider]  pravastatin (PRAVACHOL) 80 MG tablet Take 80 mg by mouth daily.  03/22/14   Yes [provider]  vitamin B-12 (CYANOCOBALAMIN) 1000 MCG tablet Take 1,000 mcg by mouth daily.   Yes [provider]  warfarin (COUMADIN) 5 MG tablet TAKE 1 TO 1 AND 1/2 TABLET  BY MOUTH DAILY OR AS  DIRECTED Patient taking differently: Take 2.5-5 mg by mouth daily. Take 2.5 mg (5 mg x 0.5) by mouth every Fri; and take 5 mg (5 mg x 1) all other days 02/17/18  Yes Thompson Grayer, MD    Physical Exam: Vitals:   05/13/18 0121 05/13/18 0128 05/13/18 0130 05/13/18 0252  BP:  (!) 149/78 (!) 117/98 (!) 162/88  Pulse: 82 80 80 85  Resp:  16 17 16  Temp:    98 F (36.7 C)  TempSrc:    Oral  SpO2: 94% 95% 94% 100%  Weight:    95.6 kg  Height:    5' 7"  (1.702 m)   General: Not in acute distress HEENT:       Eyes: PERRL, EOMI, no scleral icterus.       ENT: No discharge from the ears and nose, no pharynx injection, no tonsillar enlargement.        Neck: No JVD, no bruit, no mass felt. Heme: No neck lymph node enlargement. Cardiac: S1/S2, RRR, No murmurs, No gallops or rubs. Respiratory:  No rales, wheezing, rhonchi or rubs. GI: Soft, nondistended, nontender, no rebound pain, no organomegaly, BS present. GU: No hematuria Ext: No pitting leg edema bilaterally. 2+DP/PT pulse bilaterally. Musculoskeletal: No joint deformities, No joint redness or warmth, no limitation of ROM in spin. Skin: No rashes.  Neuro: she is sedated and confused, but arousable.  She is oriented x3 when is aroused. Her talk is vague. Cranial nerves II-XII grossly intact, moves all extremities. Psych: Patient is not psychotic, no suicidal or hemocidal ideation.  Labs on Admission: I have personally reviewed following labs and imaging studies  CBC: Recent Labs  Lab 05/12/18 2304  WBC 7.5  NEUTROABS 5.6  HGB 10.2*  HCT 32.7*  MCV 91.1  PLT 161   Basic Metabolic Panel: Recent Labs  Lab 05/12/18 2304  NA 134*  K 3.8  CL 102  CO2 22  GLUCOSE 142*  BUN 13  CREATININE 0.94  CALCIUM 9.2    GFR: Estimated Creatinine Clearance: 58.6 mL/min (by C-G formula based on SCr of 0.94 mg/dL). Liver Function Tests: No results for input(s): AST, ALT, ALKPHOS, BILITOT, PROT, ALBUMIN in the last 168 hours. No results for input(s): LIPASE, AMYLASE in the last 168 hours. No results for input(s): AMMONIA in the last 168 hours. Coagulation Profile: Recent Labs  Lab 05/12/18 1903  INR 2.69   Cardiac Enzymes: Recent Labs  Lab 05/13/18 0226  TROPONINI 1.00*   BNP (last 3 results) No results for input(s): PROBNP in the last 8760 hours. HbA1C: No results for input(s): HGBA1C in the last 72 hours. CBG: No results for input(s): GLUCAP in the last 168 hours. Lipid Profile: No results for input(s): CHOL, HDL, LDLCALC, TRIG, CHOLHDL, LDLDIRECT in the last 72 hours. Thyroid Function Tests: No results for input(s): TSH, T4TOTAL, FREET4, T3FREE, THYROIDAB in the last 72 hours. Anemia Panel: No results for input(s): VITAMINB12, FOLATE, FERRITIN, TIBC, IRON, RETICCTPCT in the last 72 hours. Urine analysis:    Component Value Date/Time   COLORURINE YELLOW 11/28/2012 2001   APPEARANCEUR CLOUDY (A) 11/28/2012 2001   LABSPEC 1.013 11/28/2012 2001   PHURINE 5.5 11/28/2012 2001   GLUCOSEU NEGATIVE 11/28/2012 2001   GLUCOSEU NEGATIVE 05/02/2011 1013   HGBUR NEGATIVE 11/28/2012 2001   BILIRUBINUR NEGATIVE 11/28/2012 2001   KETONESUR NEGATIVE 11/28/2012 2001   PROTEINUR NEGATIVE 11/28/2012 2001   UROBILINOGEN 0.2 11/28/2012 2001   NITRITE NEGATIVE 11/28/2012 2001   LEUKOCYTESUR MODERATE (A) 11/28/2012 2001   Sepsis Labs: @LABRCNTIP (procalcitonin:4,lacticidven:4) )No results found for this or any previous visit (from the past 240 hour(s)).   Radiological Exams on Admission: Dg Chest 1 View  Result Date: 05/13/2018 CLINICAL DATA:  Evaluate for possible cardiac pacemaker device. History of atrial fibrillation, RIGHT breast cancer. EXAM: CHEST  1 VIEW COMPARISON:  Chest radiograph April 10, 2011 FINDINGS: Cardiac silhouette is moderately enlarged. Interstitial prominence without pleural effusion or  focal consolidation. LEFT mid lung zone atelectasis/scarring. Calcified aortic arch. No pneumothorax. Surgical clips RIGHT axilla. No radiopaque foreign bodies, specifically no cardiac pacemaker/AICD. Osteopenia. IMPRESSION: 1. No radiopaque foreign bodies, no cardiac pacemaker/AICD. 2. Cardiomegaly.  Interstitial prominence may be chronic. 3.  Aortic Atherosclerosis (ICD10-I70.0). Electronically Signed   By: Elon Alas M.D.   On: 05/13/2018 00:03   Ct Head Wo Contrast  Result Date: 05/12/2018 CLINICAL DATA:  Recent trip and fall with headaches EXAM: CT HEAD WITHOUT CONTRAST TECHNIQUE: Contiguous axial images were obtained from the base of the skull through the vertex without intravenous contrast. COMPARISON:  04/11/2018 FINDINGS: Brain: Mild chronic white matter ischemic changes are seen. No findings to suggest acute hemorrhage, acute infarction or space-occupying mass lesion are noted. Basal ganglia calcifications are again noted. Vascular: No hyperdense vessel or unexpected calcification. Skull: Normal. Negative for fracture or focal lesion. Sinuses/Orbits: No acute finding. Other: None. IMPRESSION: Chronic white matter ischemic changes without acute abnormality. Electronically Signed   By: Inez Catalina M.D.   On: 05/12/2018 20:50   Mr Brain Wo Contrast  Result Date: 05/13/2018 CLINICAL DATA:  Episodic cancer vertigo,. History of breast hypertension and hyperlipidemia. EXAM: MRI HEAD WITHOUT CONTRAST TECHNIQUE: Multiplanar, multiecho pulse sequences of the brain and surrounding structures were obtained without intravenous contrast. COMPARISON:  CT HEAD May 12, 2018 FINDINGS: Moderate to severely motion degraded examination. INTRACRANIAL CONTENTS: No reduced diffusion to suggest acute ischemia. Chronic microhemorrhage LEFT basal ganglia in addition to basal ganglia mineralization.  The ventricles and sulci are normal for patient's age. Confluent supratentorial and pontine white matter FLAIR T2 hyperintensities. Hazy T2 hyperintense signal bilateral basal ganglia and prominent basal ganglia perivascular spaces associated chronic small vessel ischemic changes. No midline shift, mass effect or masses. No abnormal extra-axial fluid collections. No extra-axial masses. VASCULAR: Normal major intracranial vascular flow voids present at skull base. SKULL AND UPPER CERVICAL SPINE: No abnormal sellar expansion. No suspicious calvarial bone marrow signal. Craniocervical junction maintained. SINUSES/ORBITS: Trace mastoid effusion. Paranasal sinuses are well aerated. Included ocular globes and orbital contents are non-suspicious. Status post bilateral ocular lens implants. OTHER: None. IMPRESSION: 1. Motion degraded examination.  No acute intracranial process. 2. Severe chronic small vessel ischemic changes, there may be a component of hypertensive encephalopathy. Electronically Signed   By: Elon Alas M.D.   On: 05/13/2018 00:47     EKG: Independently reviewed.  Sinus rhythm, QTC 494, anteroseptal infarction pattern, T wave inversion in lateral leads and V5-V6 which is new, LAD.  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 She developed vertigo after fall. She is on Coumadin with INR 2.69.  CT head is negative for acute intracranial abnormalities.  MRI has some motion degradation, but is negative for stroke.  Patient may have labyrinthine contusion causing vertigo. Her talk is vague, which is likely due to sedation.   - Placed on  telemetry bed for observation -PT/OT -Frequent neuro check -Hold off sedative medications including meclizine - Observe closely. If no improvement, may need to repeat CT head or MRI of her brain in morning. - Keep patient n.p.o. now until completely waking up - IVF: 100  cc/h of NS - will schedule home meds starting in AM  Questionable acute metabolic encephalopathy: pt is sedated and confused, not sure if patient has true altered mental status or just because of sedation. -Frequent neuro checks  HLD: -Pravastatin  Hypertension: -Continue Coreg, hydralazine, Cozaar -IV hydralazine.  Hypothyroidism: -Continue Synthroid  Anemia: Hemoglobin stable, 10.2. - Follow-up by CBC  Diabetes mellitus without complications: W2O 6.6 on 12/08/2016, well controlled.  Patient is taking metformin at home. - SSI  Atrial Fibrillation: CHA2DS2-VASc Score is 5, needs oral anticoagulation. Patient is on Coumadin at home. INR is 2.69 on admission. Heart rate is well controlled. -Continue Coumadin per pharm - Continue Coreg  Abnormal EKG: EKG showed T wave inversion in the lateral leads and V5-V6, which is new.  Patient denies chest pain.  Etiology is not clear. -Patient is on pravastatin and Coreg - Check troponin x3  Addendum: Trop comes back positive 1.0. pt does not have chest pain. - Card, Dr. Shirlee Latch was consulted - switch coumadin to IV heparin-->watch mental status carefully. If mental status is worsening, may need to repeat CT head to make sure patient does not have intracranial bleeding. - cycle CE q6 x3 and repeat EKG in the am  - prn Nitroglycerin, Morphine - switch pravastatin to Lipitor 80 mg daily - will not start ASA since pt is on Coumadin and just had head injury due to fall. - Risk factor stratification: will check FLP and A1C  - 2d echo -change obs to inpt status    Inpatient status:  # Patient requires inpatient status due to high intensity of service, high risk for  further deterioration and high frequency of surveillance required.  I certify that at the point of admission it is my clinical judgment that the patient will require inpatient hospital care spanning beyond 2 midnights from the point of admission.  . This patient has multiple chronic comorbidities including hypertension, hyperlipidemia, diabetes mellitus, hypothyroidism, depression, atrial fibrillation on Coumadin, anemia, breast cancer (right mastectomy). . Now patient has presenting symptoms include fall, confusion, vertigo . The worrisome physical exam findings include confusion,  . The initial radiographic and laboratory data are worrisome because of positive trop 1.0-->NSTEMI . Current medical needs: please see my assessment and plan . Predictability of an adverse outcome (risk): pt was found to have NSTEMI with trop 1.0, will likely need cardiac catheterization. Pt will need to stay in hospital for at least 2 days.     DVT ppx: on Coumadin Code Status: Full code Family Communication: None at bed side.     Disposition Plan:  Anticipate discharge back to previous home environment Consults called:  none Admission status: Obs / tele    Date of Service 05/13/2018    Ivor Costa [facility] Pager [phone]  If 7PM-7AM, please contact night-coverage [email or web address] Password [email or web address] 05/13/2018, 5:07 AM

## 2018-05-13 NOTE — ED Provider Notes (Signed)
I assumed care in signout to follow-up on MRI.  Patient had significant vertigo while in the ED.  MRI results are limited but negative for acute stroke.  Patient still has significant vertiginous symptoms.  With any movement in the bed her vertigo returns. She is also drowsy from medications.  Nurse reports earlier she had episode of slurred speech and decreased handgrip, but at this time I see no focal neuro deficits.  No facial droop, no arm or leg drift, equal handgrips are noted.  She will need to be admitted due to high fall risk with continued vertigo while on Coumadin.  Discussed with Dr. Blaine Hamper for admission   Ripley Fraise, MD 05/13/18 [phone]

## 2018-05-13 NOTE — Consult Note (Signed)
Cardiology Consultation:   Patient ID: Gloria Joseph MRN: [id number]; DOB: 09-11-1940  Admit date: 05/12/2018 Date of Consult: 05/13/2018  Primary Care Provider: Jani Gravel, MD Primary Cardiologist: No primary care provider on file.  Primary Electrophysiologist:  None    Patient Profile:   Gloria Joseph is a 77 y.o. female with a hx of hypertension, hyperlipidemia, diabetes mellitus, hypothyroidism, depression, atrial fibrillation on Coumadin, anemia, breast cancer (right mastectomy), who is being seen today for the evaluation of NSTEMI at the request of Dr. Blaine Hamper.  History of Present Illness:   Gloria Joseph is a pleasant 77 yo lady  with hx hypertension, dyslipidemia, Type 2 diabetes mellitus, noninsulin dependent, hypothyroidism, depression, atrial fibrillation on Coumadin, anemia, breast cancer (right mastectomy), who presented earlier after fall.    She was headed on her way to church when she had mechanical fall and tripped and then hit her head once and then fell a second time hitting both sides of her head.  She denies any chest pain or sob prior to this episode.  Has ben feeling well generally in her normal state of healht. Only major complaint has been dizziness and also nausea and vomting.  IN the ER ecg demonstrates subtle t wave inversions from prior ecg. Neuro work up  Was started with neg head ct and mri however mri limited by motion. Trop was checked and on floor was found to be 1. Repeat ecg has no dynamic change.   Past Medical History:  Diagnosis Date  . Anemia 12/18/2011  . Atrial fibrillation ([place]) 04/13/2011  . Cancer [facility])    breast CA s/p Right mastectomy  . Diabetes mellitus, type 2 (HCC)    diet controlled  . Hx of echocardiogram    Echocardiogram (12/15): Moderate LVH, EF 55-60%, normal wall motion, grade 1 diastolic dysfunction, aortic sclerosis without stenosis, mild AI, MAC  . Hyperlipidemia   . Hypertension    medicine refractory  .  Hypothyroidism    s/p thryroid gland ablation  . Neuromuscular disorder ([place])   . NSTEMI (non-ST elevated myocardial infarction) ([place]) 05/13/2018  . Osteopenia   . Renal insufficiency, mild 09/23/2013    Past Surgical History:  Procedure Laterality Date  . BREAST ENHANCEMENT SURGERY    . BREAST IMPLANT REMOVAL  2000   Unilaterally   . BREAST SURGERY    . CHOLECYSTECTOMY    . COLONOSCOPY  2002   Neg  . MASTECTOMY    . RAI  04/04/2006  . TUBAL LIGATION       Home Medications:  Prior to Admission medications   Medication Sig Start Date End Date Taking? Authorizing Provider  carvedilol (COREG) 3.125 MG tablet Take 1 tablet by mouth 2 (two) times daily. 02/17/18  Yes [provider]  Cholecalciferol (VITAMIN D-3) 1000 UNITS CAPS Take 1 capsule by mouth daily.   Yes [provider]  citalopram (CELEXA) 20 MG tablet Take 20 mg by mouth daily.   Yes [provider]  Coenzyme Q10 (CO Q 10 PO) Take 1 capsule by mouth daily.   Yes [provider]  Flaxseed, Linseed, (FLAXSEED OIL PO) Take 1 tablet by mouth daily.   Yes [provider]  hydrALAZINE (APRESOLINE) 100 MG tablet Take 150 mg by mouth 2 (two) times daily.   Yes [provider]  levothyroxine (SYNTHROID, LEVOTHROID) 112 MCG tablet Take 1 tablet (112 mcg) by mouth daily. EXCEPT Tuesdays and Thursdays take 1/2 tablet (56 mcg) by mouth daily. 04/24/16  Yes  [provider]  losartan (COZAAR) 100 MG tablet Take 100 mg by mouth daily.   Yes [provider]  metFORMIN (GLUCOPHAGE) 1000 MG tablet Take 500 mg by mouth 2 (two) times daily. 02/04/16  Yes [provider]  Multiple Vitamins-Minerals (MULTIVITAMINS) CHEW Chew 1 tablet by mouth daily.   Yes [provider]  Omega-3 Fatty Acids (FISH OIL PO) Take 1 capsule by mouth daily.   Yes [provider]  pravastatin (PRAVACHOL) 80 MG tablet Take 80 mg by mouth daily.  03/22/14  Yes [provider]  vitamin B-12 (CYANOCOBALAMIN) 1000 MCG tablet Take 1,000 mcg by mouth daily.   Yes [provider]  warfarin (COUMADIN) 5 MG tablet TAKE 1 TO 1 AND 1/2 TABLET  BY MOUTH DAILY OR AS  DIRECTED Patient taking differently: Take 2.5-5 mg by mouth daily. Take 2.5 mg (5 mg x 0.5) by mouth every Fri; and take 5 mg (5 mg x 1) all other days 02/17/18  Yes Allred, Jeneen Rinks, MD    Inpatient Medications: Scheduled Meds: . atorvastatin  80 mg Oral q1800  . carvedilol  3.125 mg Oral BID  . cholecalciferol  1,000 Units Oral Daily  . citalopram  20 mg Oral Daily  . hydrALAZINE  150 mg Oral BID  . insulin aspart  0-5 Units Subcutaneous QHS  . insulin aspart  0-9 Units Subcutaneous TID WC  . levothyroxine  112 mcg Oral Q0600  . losartan  100 mg Oral Daily  . multivitamin with minerals  1 tablet Oral Daily  . omega-3 acid ethyl esters  1 g Oral Daily  . vitamin B-12  1,000 mcg Oral Daily   Continuous Infusions: . sodium chloride 100 mL/hr at 05/13/18 0445  . heparin     PRN Meds: acetaminophen **OR** acetaminophen, hydrALAZINE, morphine injection, nitroGLYCERIN, ondansetron **OR** ondansetron (ZOFRAN) IV  Allergies:    Allergies  Allergen Reactions  . Lisinopril     ? Angioedema (NOT ARB candidate) Because of a history of documented adverse serious drug reaction;Medi Alert bracelet  is recommended  . Hctz [Hydrochlorothiazide]     hyponatremia  . Spironolactone     REACTION: low potassium ???  . Penicillins Other (See Comments)    Pt states medication doesn't work for her d/t her usage of it several years ago.  . Amlodipine Besylate     REACTION: edema    Social History:   Social History   Socioeconomic History  . Marital status: Divorced    Spouse name: Not on file  . Number of children: Not on file  . Years of education: Not on file  . Highest education level: Not on file  Occupational History  . Not on file  Social Needs  . Financial resource strain: Not on  file  . Food insecurity:    Worry: Not on file    Inability: Not on file  . Transportation needs:    Medical: Not on file    Non-medical: Not on file  Tobacco Use  . Smoking status: Never Smoker  . Smokeless tobacco: Never Used  Substance and Sexual Activity  . Alcohol use: No  . Drug use: No  . Sexual activity: Never  Lifestyle  . Physical activity:    Days per week: Not on file    Minutes per session: Not on file  . Stress: Not on file  Relationships  . Social connections:    Talks on phone: Not on file    Gets together:  Not on file    Attends religious service: Not on file    Active member of club or organization: Not on file    Attends meetings of clubs or organizations: Not on file    Relationship status: Not on file  . Intimate partner violence:    Fear of current or ex partner: Not on file    Emotionally abused: Not on file    Physically abused: Not on file    Forced sexual activity: Not on file  Other Topics Concern  . Not on file  Social History Narrative  . Not on file    Family History:   Family History  Problem Relation Age of Onset  . Depression Mother   . Alcohol abuse Father   . Heart attack Brother 53  . Cancer Other        Uncle, not specific  . Cancer Other        Aunt, not specific. GYN CA  . Breast cancer Other        Aunt, not specific  . Diabetes Other        Grandmother, not specific  . Cancer Maternal Grandfather        colon      Review of Systems: [y] = yes, _0  = no   . General: Weight gain _1 ; Weight loss _2 ; Anorexia _3 ; Fatigue _4 ; Fever _5 ; Chills _6 ; Weakness _7   . Cardiac: Chest pain/pressure _8 ; Resting SOB _9 ; Exertional SOB _10 ; Orthopnea _11 ; Pedal Edema _12 ; Palpitations _13 ; Syncope _14 ; Presyncope _15 ; Paroxysmal nocturnal dyspnea_16   . Pulmonary: Cough _17 ; Wheezing_18 ; Hemoptysis_19 ; Sputum _20 ; Snoring _21   . GI: Vomiting_22 ; Dysphagia_23 ; Melena_24 ; Hematochezia _25 ; Heartburn_26 ; Abdominal pain _27 ;  Constipation _28 ; Diarrhea _29 ; BRBPR _30   . GU: Hematuria_31 ; Dysuria _32 ; Nocturia_33   . Vascular: Pain in legs with walking _34 ; Pain in feet with lying flat _35 ; Non-healing sores _36 ; Stroke _37 ; TIA _38 ; Slurred speech _39 ;  . Neuro: Headaches_40 ; Vertigo_41 ; Seizures_42 ; Paresthesias_43 ;Blurred vision _44 ; Diplopia _45 ; Vision changes _46  ; dizziness [y] . Ortho/Skin: Arthritis _47 ; Joint pain _48 ; Muscle pain _49 ; Joint swelling _50 ; Back Pain _51 ; Rash _52   . Psych: Depression_53 ; Anxiety_54   . Heme: Bleeding problems _55 ; Clotting disorders _56 ; Anemia _57   . Endocrine: Diabetes _58 ; Thyroid dysfunction_59   Physical Exam/Data:   Vitals:   05/13/18 0121 05/13/18 0128 05/13/18 0130 05/13/18 0252  BP:  (!) 149/78 (!) 117/98 (!) 162/88  Pulse: 82 80 80 85  Resp:  _60 Temp:    98 F (36.7 C)  TempSrc:    Oral  SpO2: 94% 95% 94% 100%  Weight:    95.6 kg  Height:    _61  (1.702 m)    Intake/Output Summary (Last 24 hours) at 05/13/2018 0515 Last data filed at 05/13/2018 0445 Gross per 24 hour  Intake 0 ml  Output -  Net 0 ml   Filed Weights   05/12/18 1847 05/13/18 0252  Weight: 89.5 kg 95.6 kg   Body mass index is 33.01 kg/m.  General:  Well nourished, well developed, in no acute distress HEENT: normal Lymph: no adenopathy Neck: no JVD Endocrine:  No thryomegaly Vascular: No carotid bruits; FA pulses 2+ bilaterally without bruits  Cardiac:  normal S1, S2; RRR; no murmur  Lungs:  clear to auscultation bilaterally, no wheezing, rhonchi or rales  Abd: soft, nontender, no hepatomegaly  Ext: no edema Musculoskeletal:  No deformities, BUE and BLE strength normal and equal Skin: warm and dry  Neuro:  CNs 2-12 intact, no focal abnormalities noted Psych:  Normal affect   EKG:  The EKG was personally reviewed and demonstrates:  normal sinus rhythm with lateral t wave inversions with inferior and antero q waves suggestive prior MI  Relevant CV Studies: Trop  1.0  Laboratory Data:  Chemistry Recent Labs  Lab 05/12/18 2304  NA 134*  K 3.8  CL 102  CO2 22  GLUCOSE 142*  BUN 13  CREATININE 0.94  CALCIUM 9.2  GFRNONAA 58*  GFRAA >60  ANIONGAP 10    No results for input(s): PROT, ALBUMIN, AST, ALT, ALKPHOS, BILITOT in the last 168 hours. Hematology Recent Labs  Lab 05/12/18 2304  WBC 7.5  RBC 3.59*  HGB 10.2*  HCT 32.7*  MCV 91.1  MCH 28.4  MCHC 31.2  RDW 14.8  PLT 277   Cardiac Enzymes Recent Labs  Lab 05/13/18 0226  TROPONINI 1.00*   No results for input(s): TROPIPOC in the last 168 hours.  BNPNo results for input(s): BNP, PROBNP in the last 168 hours.  DDimer No results for input(s): DDIMER in the last 168 hours.  Radiology/Studies:  Dg Chest 1 View  Result Date: 05/13/2018 CLINICAL DATA:  Evaluate for possible cardiac pacemaker device. History of atrial fibrillation, RIGHT breast cancer. EXAM: CHEST  1 VIEW COMPARISON:  Chest radiograph April 10, 2011 FINDINGS: Cardiac silhouette is moderately enlarged. Interstitial prominence without pleural effusion or focal consolidation. LEFT mid lung zone atelectasis/scarring. Calcified aortic arch. No pneumothorax. Surgical clips RIGHT axilla. No radiopaque foreign bodies, specifically no cardiac pacemaker/AICD. Osteopenia. IMPRESSION: 1. No radiopaque foreign bodies, no cardiac pacemaker/AICD. 2. Cardiomegaly.  Interstitial prominence may be chronic. 3.  Aortic Atherosclerosis (ICD10-I70.0). Electronically Signed   By: Elon Alas M.D.   On: 05/13/2018 00:03   Ct Head Wo Contrast  Result Date: 05/12/2018 CLINICAL DATA:  Recent trip and fall with headaches EXAM: CT HEAD WITHOUT CONTRAST TECHNIQUE: Contiguous axial images were obtained from the base of the skull through the vertex without intravenous contrast. COMPARISON:  04/11/2018 FINDINGS: Brain: Mild chronic white matter ischemic changes are seen. No findings to suggest acute hemorrhage, acute infarction or  space-occupying mass lesion are noted. Basal ganglia calcifications are again noted. Vascular: No hyperdense vessel or unexpected calcification. Skull: Normal. Negative for fracture or focal lesion. Sinuses/Orbits: No acute finding. Other: None. IMPRESSION: Chronic white matter ischemic changes without acute abnormality. Electronically Signed   By: Inez Catalina M.D.   On: 05/12/2018 20:50   Mr Brain Wo Contrast  Result Date: 05/13/2018 CLINICAL DATA:  Episodic cancer vertigo,. History of breast hypertension and hyperlipidemia. EXAM: MRI HEAD WITHOUT CONTRAST TECHNIQUE: Multiplanar, multiecho pulse sequences of the brain and surrounding structures were obtained without intravenous contrast. COMPARISON:  CT HEAD May 12, 2018 FINDINGS: Moderate to severely motion degraded examination. INTRACRANIAL CONTENTS: No reduced diffusion to suggest acute ischemia. Chronic microhemorrhage LEFT basal ganglia in addition to basal ganglia mineralization. The ventricles and sulci are normal for patient's age. Confluent supratentorial and pontine white matter FLAIR T2 hyperintensities. Hazy T2 hyperintense signal bilateral basal ganglia and prominent basal ganglia perivascular spaces associated chronic small vessel ischemic changes. No  midline shift, mass effect or masses. No abnormal extra-axial fluid collections. No extra-axial masses. VASCULAR: Normal major intracranial vascular flow voids present at skull base. SKULL AND UPPER CERVICAL SPINE: No abnormal sellar expansion. No suspicious calvarial bone marrow signal. Craniocervical junction maintained. SINUSES/ORBITS: Trace mastoid effusion. Paranasal sinuses are well aerated. Included ocular globes and orbital contents are non-suspicious. Status post bilateral ocular lens implants. OTHER: None. IMPRESSION: 1. Motion degraded examination.  No acute intracranial process. 2. Severe chronic small vessel ischemic changes, there may be a component of hypertensive  encephalopathy. Electronically Signed   By: Elon Alas M.D.   On: 05/13/2018 00:47    Assessment and Plan:   1. Fall with TBI 1. redidual dizziness/nausea 2. NSTEMI likely type 2 3. Paroxysmal atrial fib on coumadin 4. DM2 5. HTN  -Hold coumadin and will consider starting heparin once INR <2. -Recommend further neuro workup given neuro symptoms post head injury. Would closely watch for intracerebral bleed. -trend ECG and troponin. -Complete echo and based on rest of neuro work up will consider ischemic evalution. -OK to given statin and bb and aspirin.     For questions or updates, please contact [place] Please consult [email or web address] for contact info under     Signed, Chancy Milroy, MD  05/13/2018 5:15 AM

## 2018-05-13 NOTE — ED Notes (Signed)
Patient transported to MRI 

## 2018-05-13 NOTE — Progress Notes (Signed)
Pt troponin 1.0. Pt resting comfortably in room, denies any chest pain. Notified MD on call. Will continue to monitor.

## 2018-05-13 NOTE — Progress Notes (Addendum)
ANTICOAGULATION CONSULT NOTE - Initial Consult  Pharmacy Consult for heparin Indication: atrial fibrillation  Allergies  Allergen Reactions  . Lisinopril     ? Angioedema (NOT ARB candidate) Because of a history of documented adverse serious drug reaction;Medi Alert bracelet  is recommended  . Hctz [Hydrochlorothiazide]     hyponatremia  . Spironolactone     REACTION: low potassium ???  . Penicillins Other (See Comments)    Pt states medication doesn't work for her d/t her usage of it several years ago.  . Amlodipine Besylate     REACTION: edema    Patient Measurements: Height: _0  (170.2 cm) Weight: 197 lb 5 oz (89.5 kg) IBW/kg (Calculated) : 61.6  Vital Signs: BP: 149/78 (12/25 0128) Pulse Rate: 80 (12/25 0128)  Labs: Recent Labs    05/12/18 1903 05/12/18 2304  HGB  --  10.2*  HCT  --  32.7*  PLT  --  277  LABPROT 28.2*  --   INR 2.69  --   CREATININE  --  0.94    Estimated Creatinine Clearance: 56.7 mL/min (by C-G formula based on SCr of 0.94 mg/dL).   Medical History: Past Medical History:  Diagnosis Date  . Anemia 12/18/2011  . Atrial fibrillation ([place]) 04/13/2011  . Cancer [facility])    breast CA s/p Right mastectomy  . Diabetes mellitus, type 2 (HCC)    diet controlled  . Hx of echocardiogram    Echocardiogram (12/15): Moderate LVH, EF 55-60%, normal wall motion, grade 1 diastolic dysfunction, aortic sclerosis without stenosis, mild AI, MAC  . Hyperlipidemia   . Hypertension    medicine refractory  . Hypothyroidism    s/p thryroid gland ablation  . Neuromuscular disorder ([place])   . Osteopenia   . Renal insufficiency, mild 09/23/2013    Medications:  See medication history.  Coumadin dose 2.5 mg on Friday, 5 mg other days Coumadin on hold, will start heparin when INR <2.0  Assessment: 77 yo lady to continue coumadin for afib. INR yesterday 2.69.  Last dose coumadin 12/23. Goal of Therapy:  INR 2-3 Monitor platelets by anticoagulation protocol:  Yes   Plan:  Daily Protime/INR Start heparin when INR <2.0 Monitor for bleeding complications  Gloria Joseph 05/13/2018,2:12 AM

## 2018-05-13 NOTE — Progress Notes (Signed)
PROGRESS NOTE    Gloria Joseph   VOZ:[id number]  DOB: 1941-01-19  DOA: 05/12/2018 PCP: Jani Gravel, MD   Brief Narrative:  Gloria Joseph is a 77 y.o. female with medical history significant of hypertension, hyperlipidemia, diabetes mellitus, hypothyroidism, depression, atrial fibrillation on Coumadin, anemia, breast cancer (right mastectomy), who presents with fall. she was on her way to church when she tripped on the sidewalk and fell. She hit her head, causing mild left side headache but subsequently developed Vertigo. She was given numerous medications in the ED to resolve the vertigo (meclizine, Ativan, Phenergan) but remained symptomatic and was admitted.  Overnight, her troponin became elevated to 1.0 and cardiology fellow was consulted.    Subjective: Mild left sided headache, mild vertigo when still and with moving. States her vertigo is chronic and she has had PT for this (head exercises) to help her Vertigo. No other neurological issues. Has not had any chest pain throughout this entire episode.      Assessment & Plan:   Principal Problem:   Fall with head injury in setting of Coumadin use and subsequent vertigo - tripped and hit left side of head - no abnormalities on initial CT or MRI (severe motion artifact on MRI) - vertigo much better today- appears she may have BPPV and has exercises she does for it at home -  Barely any headache.  - repeat head CT is negative for bleeding - obtain PT eval  Active Problems:   NSTEMI (non-ST elevated myocardial infarction) - did not have chest pain- troponin trending down now - INR is therapeutic and thus heparin not started - 2 d ECHO shows new finding of EF of 20-25% with akinesis, grade 1 d CHF and mild RV dysfunction as well- see ECHO below - cardiology following    PAF (paroxysmal atrial fibrillation) - cont Coreg - Coumadin on hold as cardiology may want to cath    Essential hypertension - Coreg, Hydralazine,  Cozaar    Diabetes mellitus 2  - hold Metformin cont SSI   HLD - Lipitor    Hypothyroidism - Synthroid  Anemia  - chronic- and close to baseling    DVT prophylaxis: INR therapeutic Code Status: Full code Family Communication:  Disposition Plan: await cardiology opinion Consultants:   cardiology Procedures:  Left ventricle: The cavity size was mildly dilated. Wall   thickness was increased in a pattern of mild LVH. Systolic   function was severely reduced. The estimated ejection fraction   was in the range of 20% to 25%. There is akinesis of the   apicalanteroseptal, anterior, and inferior myocardium. Doppler   parameters are consistent with abnormal left ventricular   relaxation (grade 1 diastolic dysfunction). Doppler parameters   are consistent with high ventricular filling pressure. - Aortic valve: There was mild regurgitation. - Left atrium: The atrium was mildly dilated. - Right ventricle: The cavity size was mildly dilated. Systolic   function was mildly to moderately reduced. - Pulmonary arteries: PA peak pressure: 45 mm Hg (S). - Pericardium, extracardiac: A trivial pericardial effusion was   identified posterior to the heart. Antimicrobials:  Anti-infectives (From admission, onward)   None       Objective: Vitals:   05/13/18 0121 05/13/18 0128 05/13/18 0130 05/13/18 0252  BP:  (!) 149/78 (!) 117/98 (!) 162/88  Pulse: 82 80 80 85  Resp:  16 17 16   Temp:    98 F (36.7 C)  TempSrc:    Oral  SpO2:  94% 95% 94% 100%  Weight:    95.6 kg  Height:    5\' 7"  (1.702 m)    Intake/Output Summary (Last 24 hours) at 05/13/2018 0914 Last data filed at 05/13/2018 0445 Gross per 24 hour  Intake 0 ml  Output -  Net 0 ml   Filed Weights   05/12/18 1847 05/13/18 0252  Weight: 89.5 kg 95.6 kg    Examination: General exam: Appears comfortable  HEENT: PERRLA, oral mucosa moist, no sclera icterus or thrush Respiratory system: Clear to auscultation.  Respiratory effort normal. Cardiovascular system: S1 & S2 heard, RRR.   Gastrointestinal system: Abdomen soft, non-tender, nondistended. Normal bowel sounds. Central nervous system: Alert and oriented. No focal neurological deficits. Extremities: No cyanosis, clubbing or edema Skin: No rashes or ulcers Psychiatry:  Mood & affect appropriate.     Data Reviewed: I have personally reviewed following labs and imaging studies  CBC: Recent Labs  Lab 05/12/18 2304 05/13/18 0619  WBC 7.5 5.6  NEUTROABS 5.6  --   HGB 10.2* 9.4*  HCT 32.7* 29.8*  MCV 91.1 88.7  PLT 277 785   Basic Metabolic Panel: Recent Labs  Lab 05/12/18 2304 05/13/18 0619  NA 134* 136  K 3.8 3.5  CL 102 100  CO2 22 24  GLUCOSE 142* 108*  BUN 13 12  CREATININE 0.94 0.96  CALCIUM 9.2 8.8*   GFR: Estimated Creatinine Clearance: 57.3 mL/min (by C-G formula based on SCr of 0.96 mg/dL). Liver Function Tests: No results for input(s): AST, ALT, ALKPHOS, BILITOT, PROT, ALBUMIN in the last 168 hours. No results for input(s): LIPASE, AMYLASE in the last 168 hours. No results for input(s): AMMONIA in the last 168 hours. Coagulation Profile: Recent Labs  Lab 05/12/18 1903 05/13/18 0619  INR 2.69 2.43   Cardiac Enzymes: Recent Labs  Lab 05/13/18 0226 05/13/18 0619  TROPONINI 1.00* 1.46*   BNP (last 3 results) No results for input(s): PROBNP in the last 8760 hours. HbA1C: No results for input(s): HGBA1C in the last 72 hours. CBG: Recent Labs  Lab 05/13/18 0827  GLUCAP 103*   Lipid Profile: Recent Labs    05/13/18 0619  CHOL 116  HDL 46  LDLCALC 49  TRIG 103  CHOLHDL 2.5   Thyroid Function Tests: No results for input(s): TSH, T4TOTAL, FREET4, T3FREE, THYROIDAB in the last 72 hours. Anemia Panel: No results for input(s): VITAMINB12, FOLATE, FERRITIN, TIBC, IRON, RETICCTPCT in the last 72 hours. Urine analysis:    Component Value Date/Time   COLORURINE YELLOW 11/28/2012 2001   APPEARANCEUR  CLOUDY (A) 11/28/2012 2001   LABSPEC 1.013 11/28/2012 2001   PHURINE 5.5 11/28/2012 2001   GLUCOSEU NEGATIVE 11/28/2012 2001   GLUCOSEU NEGATIVE 05/02/2011 1013   HGBUR NEGATIVE 11/28/2012 2001   BILIRUBINUR NEGATIVE 11/28/2012 2001   KETONESUR NEGATIVE 11/28/2012 2001   PROTEINUR NEGATIVE 11/28/2012 2001   UROBILINOGEN 0.2 11/28/2012 2001   NITRITE NEGATIVE 11/28/2012 2001   LEUKOCYTESUR MODERATE (A) 11/28/2012 2001   Sepsis Labs: @LABRCNTIP (procalcitonin:4,lacticidven:4) )No results found for this or any previous visit (from the past 240 hour(s)).       Radiology Studies: Dg Chest 1 View  Result Date: 05/13/2018 CLINICAL DATA:  Evaluate for possible cardiac pacemaker device. History of atrial fibrillation, RIGHT breast cancer. EXAM: CHEST  1 VIEW COMPARISON:  Chest radiograph April 10, 2011 FINDINGS: Cardiac silhouette is moderately enlarged. Interstitial prominence without pleural effusion or focal consolidation. LEFT mid lung zone atelectasis/scarring. Calcified aortic arch. No pneumothorax. Surgical clips  RIGHT axilla. No radiopaque foreign bodies, specifically no cardiac pacemaker/AICD. Osteopenia. IMPRESSION: 1. No radiopaque foreign bodies, no cardiac pacemaker/AICD. 2. Cardiomegaly.  Interstitial prominence may be chronic. 3.  Aortic Atherosclerosis (ICD10-I70.0). Electronically Signed   By: Elon Alas M.D.   On: 05/13/2018 00:03   Ct Head Wo Contrast  Result Date: 05/12/2018 CLINICAL DATA:  Recent trip and fall with headaches EXAM: CT HEAD WITHOUT CONTRAST TECHNIQUE: Contiguous axial images were obtained from the base of the skull through the vertex without intravenous contrast. COMPARISON:  04/11/2018 FINDINGS: Brain: Mild chronic white matter ischemic changes are seen. No findings to suggest acute hemorrhage, acute infarction or space-occupying mass lesion are noted. Basal ganglia calcifications are again noted. Vascular: No hyperdense vessel or unexpected  calcification. Skull: Normal. Negative for fracture or focal lesion. Sinuses/Orbits: No acute finding. Other: None. IMPRESSION: Chronic white matter ischemic changes without acute abnormality. Electronically Signed   By: Inez Catalina M.D.   On: 05/12/2018 20:50   Mr Brain Wo Contrast  Result Date: 05/13/2018 CLINICAL DATA:  Episodic cancer vertigo,. History of breast hypertension and hyperlipidemia. EXAM: MRI HEAD WITHOUT CONTRAST TECHNIQUE: Multiplanar, multiecho pulse sequences of the brain and surrounding structures were obtained without intravenous contrast. COMPARISON:  CT HEAD May 12, 2018 FINDINGS: Moderate to severely motion degraded examination. INTRACRANIAL CONTENTS: No reduced diffusion to suggest acute ischemia. Chronic microhemorrhage LEFT basal ganglia in addition to basal ganglia mineralization. The ventricles and sulci are normal for patient's age. Confluent supratentorial and pontine white matter FLAIR T2 hyperintensities. Hazy T2 hyperintense signal bilateral basal ganglia and prominent basal ganglia perivascular spaces associated chronic small vessel ischemic changes. No midline shift, mass effect or masses. No abnormal extra-axial fluid collections. No extra-axial masses. VASCULAR: Normal major intracranial vascular flow voids present at skull base. SKULL AND UPPER CERVICAL SPINE: No abnormal sellar expansion. No suspicious calvarial bone marrow signal. Craniocervical junction maintained. SINUSES/ORBITS: Trace mastoid effusion. Paranasal sinuses are well aerated. Included ocular globes and orbital contents are non-suspicious. Status post bilateral ocular lens implants. OTHER: None. IMPRESSION: 1. Motion degraded examination.  No acute intracranial process. 2. Severe chronic small vessel ischemic changes, there may be a component of hypertensive encephalopathy. Electronically Signed   By: Elon Alas M.D.   On: 05/13/2018 00:47      Scheduled Meds: . atorvastatin  80 mg Oral  q1800  . carvedilol  3.125 mg Oral BID  . cholecalciferol  1,000 Units Oral Daily  . citalopram  20 mg Oral Daily  . hydrALAZINE  150 mg Oral BID  . insulin aspart  0-5 Units Subcutaneous QHS  . insulin aspart  0-9 Units Subcutaneous TID WC  . levothyroxine  112 mcg Oral Q0600  . losartan  100 mg Oral Daily  . multivitamin with minerals  1 tablet Oral Daily  . omega-3 acid ethyl esters  1 g Oral Daily  . vitamin B-12  1,000 mcg Oral Daily   Continuous Infusions:   LOS: 0 days    Time spent in minutes: 35    Gloria Odea, MD [facility] Pager: [email or web address] Password [facility] 05/13/2018, 9:14 AM

## 2018-05-14 DIAGNOSIS — I480 Paroxysmal atrial fibrillation: Secondary | ICD-10-CM

## 2018-05-14 DIAGNOSIS — E782 Mixed hyperlipidemia: Secondary | ICD-10-CM

## 2018-05-14 DIAGNOSIS — I10 Essential (primary) hypertension: Secondary | ICD-10-CM

## 2018-05-14 DIAGNOSIS — R9431 Abnormal electrocardiogram [ECG] [EKG]: Secondary | ICD-10-CM

## 2018-05-14 DIAGNOSIS — I420 Dilated cardiomyopathy: Secondary | ICD-10-CM

## 2018-05-14 LAB — BASIC METABOLIC PANEL
Anion gap: 8 (ref 5–15)
BUN: 16 mg/dL (ref 8–23)
CO2: 25 mmol/L (ref 22–32)
Calcium: 8.9 mg/dL (ref 8.9–10.3)
Chloride: 102 mmol/L (ref 98–111)
Creatinine, Ser: 1.22 mg/dL — ABNORMAL HIGH (ref 0.44–1.00)
GFR calc Af Amer: 49 mL/min — ABNORMAL LOW (ref >60)
GFR calc non Af Amer: 42 mL/min — ABNORMAL LOW (ref >60)
Glucose, Bld: 123 mg/dL — ABNORMAL HIGH (ref 70–99)
Potassium: 4.1 mmol/L (ref 3.5–5.1)
SODIUM: 135 mmol/L (ref 135–145)

## 2018-05-14 LAB — HEMOGLOBIN A1C
Hgb A1c MFr Bld: 6 % — ABNORMAL HIGH (ref 4.8–5.6)
Mean Plasma Glucose: 126 mg/dL

## 2018-05-14 LAB — CBC
HCT: 27.7 % — ABNORMAL LOW (ref 36.0–46.0)
Hemoglobin: 8.7 g/dL — ABNORMAL LOW (ref 12.0–15.0)
MCH: 28.2 pg (ref 26.0–34.0)
MCHC: 31.4 g/dL (ref 30.0–36.0)
MCV: 89.9 fL (ref 80.0–100.0)
Platelets: 256 10*3/uL (ref 150–400)
RBC: 3.08 MIL/uL — ABNORMAL LOW (ref 3.87–5.11)
RDW: 15 % (ref 11.5–15.5)
WBC: 5 10*3/uL (ref 4.0–10.5)
nRBC: 0 % (ref 0.0–0.2)

## 2018-05-14 LAB — GLUCOSE, CAPILLARY
GLUCOSE-CAPILLARY: 114 mg/dL — AB (ref 70–99)
Glucose-Capillary: 148 mg/dL — ABNORMAL HIGH (ref 70–99)
Glucose-Capillary: 100 mg/dL — ABNORMAL HIGH (ref 70–99)
Glucose-Capillary: 106 mg/dL — ABNORMAL HIGH (ref 70–99)

## 2018-05-14 LAB — PROTIME-INR
INR: 2.21
Prothrombin Time: 24.2 seconds — ABNORMAL HIGH (ref 11.4–15.2)

## 2018-05-14 NOTE — Progress Notes (Addendum)
PROGRESS NOTE    Gloria Joseph   KDX:[id number]  DOB: [birth year]  DOA: 05/12/2018 PCP: Jani Gravel, MD   Brief Narrative:  Gloria Joseph is a 77 y.o. female with medical history significant of hypertension, hyperlipidemia, diabetes mellitus, hypothyroidism, depression, atrial fibrillation on Coumadin, anemia, breast cancer (right mastectomy), who presents with fall. she was on her way to church when she tripped on the sidewalk and fell. She hit her head, causing mild left side headache but subsequently developed Vertigo. She was given numerous medications in the ED to resolve the vertigo (meclizine, Ativan, Phenergan) but remained symptomatic and was admitted.  Overnight, her troponin became elevated to 1.0 and cardiology fellow was consulted.    Subjective: Still having mild vertigo. No other complaints today.      Assessment & Plan:   Principal Problem:   Fall with head injury in setting of Coumadin use and subsequent vertigo - tripped and hit left side of head - no abnormalities on initial CT or MRI (severe motion artifact on MRI) - vertigo much better - appears she may have BPPV as she states she has exercises she does for her vertgo at home which involve turning of her head - repeat head CT is negative for bleeding - obtained PT and OT evals- PT recommends outpt vestibular PT- she has no further OT needed  Active Problems:   NSTEMI (non-ST elevated myocardial infarction) Results for LAMISHA, ROUSSELL (MRN [id number]) as of 05/14/2018 19:00 Latest Ref Range: <0.03 ng/mL 1.00 (HH) 1.46 (HH) 1.15 (HH)   - did not have chest pain- has T wave inversion in lat leads- troponin trending down now - 2 d ECHO shows new finding of EF of 20-25% with akinesis, grade 1 d CHF and mild RV dysfunction as well- see ECHO below - cardiology following- recommended to start heparin after INR comes down < 2    PAF (paroxysmal atrial fibrillation) - cont Coreg - Coumadin on hold as  cardiology may want to cath  Rising Cr - 0.9 > 1.22- hold ARB tomorrow- follow    Essential hypertension - Coreg, Hydralazine, Cozaar    Diabetes mellitus 2  - hold Metformin cont SSI   HLD - Lipitor    Hypothyroidism - Synthroid  Anemia  - chronic- and close to baseline    DVT prophylaxis: INR therapeutic Code Status: Full code Family Communication:  Disposition Plan: await cardiology opinion Consultants:   cardiology Procedures:  Left ventricle: The cavity size was mildly dilated. Wall   thickness was increased in a pattern of mild LVH. Systolic   function was severely reduced. The estimated ejection fraction   was in the range of 20% to 25%. There is akinesis of the   apicalanteroseptal, anterior, and inferior myocardium. Doppler   parameters are consistent with abnormal left ventricular   relaxation (grade 1 diastolic dysfunction). Doppler parameters   are consistent with high ventricular filling pressure. - Aortic valve: There was mild regurgitation. - Left atrium: The atrium was mildly dilated. - Right ventricle: The cavity size was mildly dilated. Systolic   function was mildly to moderately reduced. - Pulmonary arteries: PA peak pressure: 45 mm Hg (S). - Pericardium, extracardiac: A trivial pericardial effusion was   identified posterior to the heart. Antimicrobials:  Anti-infectives (From admission, onward)   None       Objective: Vitals:   05/13/18 2007 05/14/18 0627 05/14/18 0957 05/14/18 1143  BP: 109/86 (!) 148/68 (!) 126/109 (!) 125/48  Pulse: 60 Marland Kitchen)  50 65 60  Resp: 16 14 16 16   Temp: 98.9 F (37.2 C) 97.7 F (36.5 C)  99.1 F (37.3 C)  TempSrc: Oral Oral  Oral  SpO2: 96% 100% 98% 97%  Weight:      Height:        Intake/Output Summary (Last 24 hours) at 05/14/2018 1856 Last data filed at 05/14/2018 1200 Gross per 24 hour  Intake 840 ml  Output 1850 ml  Net -1010 ml   Filed Weights   05/12/18 1847 05/13/18 0252  Weight: 89.5 kg  95.6 kg    Examination: General exam: Appears comfortable  HEENT: PERRLA, oral mucosa moist, no sclera icterus or thrush Respiratory system: Clear to auscultation. Respiratory effort normal. Cardiovascular system: S1 & S2 heard,  No murmurs  Gastrointestinal system: Abdomen soft, non-tender, nondistended. Normal bowel sound. No organomegaly Central nervous system: Alert and oriented. No focal neurological deficits. Extremities: No cyanosis, clubbing or edema Skin: No rashes or ulcers Psychiatry:  Mood & affect appropriate.     Data Reviewed: I have personally reviewed following labs and imaging studies  CBC: Recent Labs  Lab 05/12/18 2304 05/13/18 0619 05/14/18 0334  WBC 7.5 5.6 5.0  NEUTROABS 5.6  --   --   HGB 10.2* 9.4* 8.7*  HCT 32.7* 29.8* 27.7*  MCV 91.1 88.7 89.9  PLT 277 274 643   Basic Metabolic Panel: Recent Labs  Lab 05/12/18 2304 05/13/18 0619 05/14/18 0334  NA 134* 136 135  K 3.8 3.5 4.1  CL 102 100 102  CO2 22 24 25   GLUCOSE 142* 108* 123*  BUN 13 12 16   CREATININE 0.94 0.96 1.22*  CALCIUM 9.2 8.8* 8.9   GFR: Estimated Creatinine Clearance: 45.1 mL/min (A) (by C-G formula based on SCr of 1.22 mg/dL (H)). Liver Function Tests: No results for input(s): AST, ALT, ALKPHOS, BILITOT, PROT, ALBUMIN in the last 168 hours. No results for input(s): LIPASE, AMYLASE in the last 168 hours. No results for input(s): AMMONIA in the last 168 hours. Coagulation Profile: Recent Labs  Lab 05/12/18 1903 05/13/18 0619 05/14/18 0334  INR 2.69 2.43 2.21   Cardiac Enzymes: Recent Labs  Lab 05/13/18 0226 05/13/18 0619 05/13/18 1234  TROPONINI 1.00* 1.46* 1.15*   BNP (last 3 results) No results for input(s): PROBNP in the last 8760 hours. HbA1C: Recent Labs    05/13/18 0619  HGBA1C 6.0*   CBG: Recent Labs  Lab 05/13/18 1707 05/13/18 2214 05/14/18 0806 05/14/18 1141 05/14/18 1713  GLUCAP 142* 169* 114* 148* 100*   Lipid Profile: Recent Labs     05/13/18 0619  CHOL 116  HDL 46  LDLCALC 49  TRIG 103  CHOLHDL 2.5   Thyroid Function Tests: No results for input(s): TSH, T4TOTAL, FREET4, T3FREE, THYROIDAB in the last 72 hours. Anemia Panel: No results for input(s): VITAMINB12, FOLATE, FERRITIN, TIBC, IRON, RETICCTPCT in the last 72 hours. Urine analysis:    Component Value Date/Time   COLORURINE YELLOW 11/28/2012 2001   APPEARANCEUR CLOUDY (A) 11/28/2012 2001   LABSPEC 1.013 11/28/2012 2001   PHURINE 5.5 11/28/2012 2001   GLUCOSEU NEGATIVE 11/28/2012 2001   GLUCOSEU NEGATIVE 05/02/2011 1013   HGBUR NEGATIVE 11/28/2012 2001   BILIRUBINUR NEGATIVE 11/28/2012 2001   KETONESUR NEGATIVE 11/28/2012 2001   PROTEINUR NEGATIVE 11/28/2012 2001   UROBILINOGEN 0.2 11/28/2012 2001   NITRITE NEGATIVE 11/28/2012 2001   LEUKOCYTESUR MODERATE (A) 11/28/2012 2001   Sepsis Labs: @LABRCNTIP (procalcitonin:4,lacticidven:4) )No results found for this or any previous visit (from  the past 240 hour(s)).       Radiology Studies: Dg Chest 1 View  Result Date: 05/13/2018 CLINICAL DATA:  Evaluate for possible cardiac pacemaker device. History of atrial fibrillation, RIGHT breast cancer. EXAM: CHEST  1 VIEW COMPARISON:  Chest radiograph April 10, 2011 FINDINGS: Cardiac silhouette is moderately enlarged. Interstitial prominence without pleural effusion or focal consolidation. LEFT mid lung zone atelectasis/scarring. Calcified aortic arch. No pneumothorax. Surgical clips RIGHT axilla. No radiopaque foreign bodies, specifically no cardiac pacemaker/AICD. Osteopenia. IMPRESSION: 1. No radiopaque foreign bodies, no cardiac pacemaker/AICD. 2. Cardiomegaly.  Interstitial prominence may be chronic. 3.  Aortic Atherosclerosis (ICD10-I70.0). Electronically Signed   By: Elon Alas M.D.   On: 05/13/2018 00:03   Ct Head Wo Contrast  Result Date: 05/13/2018 CLINICAL DATA:  Tripped and fell hitting head on cement. EXAM: CT HEAD WITHOUT CONTRAST  TECHNIQUE: Contiguous axial images were obtained from the base of the skull through the vertex without intravenous contrast. COMPARISON:  05/12/2018 FINDINGS: Brain: There is no evidence for acute hemorrhage, hydrocephalus, mass lesion, or abnormal extra-axial fluid collection. No definite CT evidence for acute infarction. Diffuse loss of parenchymal volume is consistent with atrophy. Patchy low attenuation in the deep hemispheric and periventricular white matter is nonspecific, but likely reflects chronic microvascular ischemic demyelination. Vascular: No hyperdense vessel or unexpected calcification. Skull: No evidence for fracture. No worrisome lytic or sclerotic lesion. Sinuses/Orbits: The visualized paranasal sinuses and mastoid air cells are clear. Visualized portions of the globes and intraorbital fat are unremarkable. Other: None. IMPRESSION: Stable. Atrophy with chronic small vessel white matter ischemic disease. No acute abnormality. Electronically Signed   By: Misty Stanley M.D.   On: 05/13/2018 13:11   Ct Head Wo Contrast  Result Date: 05/12/2018 CLINICAL DATA:  Recent trip and fall with headaches EXAM: CT HEAD WITHOUT CONTRAST TECHNIQUE: Contiguous axial images were obtained from the base of the skull through the vertex without intravenous contrast. COMPARISON:  04/11/2018 FINDINGS: Brain: Mild chronic white matter ischemic changes are seen. No findings to suggest acute hemorrhage, acute infarction or space-occupying mass lesion are noted. Basal ganglia calcifications are again noted. Vascular: No hyperdense vessel or unexpected calcification. Skull: Normal. Negative for fracture or focal lesion. Sinuses/Orbits: No acute finding. Other: None. IMPRESSION: Chronic white matter ischemic changes without acute abnormality. Electronically Signed   By: Inez Catalina M.D.   On: 05/12/2018 20:50   Mr Brain Wo Contrast  Result Date: 05/13/2018 CLINICAL DATA:  Episodic cancer vertigo,. History of breast  hypertension and hyperlipidemia. EXAM: MRI HEAD WITHOUT CONTRAST TECHNIQUE: Multiplanar, multiecho pulse sequences of the brain and surrounding structures were obtained without intravenous contrast. COMPARISON:  CT HEAD May 12, 2018 FINDINGS: Moderate to severely motion degraded examination. INTRACRANIAL CONTENTS: No reduced diffusion to suggest acute ischemia. Chronic microhemorrhage LEFT basal ganglia in addition to basal ganglia mineralization. The ventricles and sulci are normal for patient's age. Confluent supratentorial and pontine white matter FLAIR T2 hyperintensities. Hazy T2 hyperintense signal bilateral basal ganglia and prominent basal ganglia perivascular spaces associated chronic small vessel ischemic changes. No midline shift, mass effect or masses. No abnormal extra-axial fluid collections. No extra-axial masses. VASCULAR: Normal major intracranial vascular flow voids present at skull base. SKULL AND UPPER CERVICAL SPINE: No abnormal sellar expansion. No suspicious calvarial bone marrow signal. Craniocervical junction maintained. SINUSES/ORBITS: Trace mastoid effusion. Paranasal sinuses are well aerated. Included ocular globes and orbital contents are non-suspicious. Status post bilateral ocular lens implants. OTHER: None. IMPRESSION: 1. Motion degraded examination.  No  acute intracranial process. 2. Severe chronic small vessel ischemic changes, there may be a component of hypertensive encephalopathy. Electronically Signed   By: Elon Alas M.D.   On: 05/13/2018 00:47      Scheduled Meds: . atorvastatin  80 mg Oral q1800  . carvedilol  3.125 mg Oral BID  . cholecalciferol  1,000 Units Oral Daily  . citalopram  20 mg Oral Daily  . hydrALAZINE  150 mg Oral BID  . insulin aspart  0-5 Units Subcutaneous QHS  . insulin aspart  0-9 Units Subcutaneous TID WC  . levothyroxine  112 mcg Oral Q0600  . losartan  100 mg Oral Daily  . multivitamin with minerals  1 tablet Oral Daily  .  omega-3 acid ethyl esters  1 g Oral Daily  . vitamin B-12  1,000 mcg Oral Daily   Continuous Infusions:   LOS: 1 day    Time spent in minutes: 35    Debbe Odea, MD [facility] Pager: [email or web address] Password [facility] 05/14/2018, 6:56 PM

## 2018-05-14 NOTE — Evaluation (Signed)
Occupational Therapy Evaluation Patient Details Name: Gloria Joseph MRN: [id number] DOB: 03-Feb-1941 Today's Date: 05/14/2018    History of Present Illness pt is a 77 yo female s/p fall hitting R side of head, CT head negative and MRI brain negative for any acute events. pt with vertigo and PMHx: includes chronic vertigo, R mastectomy, edema, Afib, DMT2, HLD, HTN, HCC, obesity, edema, depression   Clinical Impression   Pt admitted with vertigo. Currently, symptoms have resolved. OTR performed Epley's maneuver and no nystagmus noted on either see throughout. No dizziness reported throughout tasks, pt requiring increased time for movement.Bp taken: 144/70 after OOB activity for the first time. Pt currently with functional limitiations only limited when vertigo is present. Pt performing ADLs at sink in standing with set-upA, toilet hygiene with Mod Independent. Pt performing ADL functional mobility a community distance x2 >350' with RW and ambulating 10' with no AD, no SOB noted. Pt does not require continued OT skilled services as pt is at her functional baseline. D/C to out-pt PT if vestibular symptoms persist.     Follow Up Recommendations  Other (comment);No OT follow up(out-pt PT)    Equipment Recommendations  None recommended by OT    Recommendations for Other Services PT consult(already ordered)     Precautions / Restrictions Precautions Precautions: None Restrictions Weight Bearing Restrictions: No      Mobility Bed Mobility Overal bed mobility: Modified Independent                Transfers Overall transfer level: Modified independent Equipment used: Rolling walker (2 wheeled);1 person hand held assist(depending on if any dizziness- supervision advised)                  Balance Overall balance assessment: Mild deficits observed, not formally tested;History of Falls                                         ADL either performed or  assessed with clinical judgement   ADL Overall ADL's : Modified independent                                       General ADL Comments: Pt requires increased time for tasks and requires a chair behind incase of dizziness; overall back to baseline for ADLs      Vision Baseline Vision/History: No visual deficits Patient Visual Report: No change from baseline Vision Assessment?: No apparent visual deficits Additional Comments: Pt working on her current vertigo out pt therapy including visual scanning- no deficits; epley's maneuver, no nystagmus present     Perception     Praxis      Pertinent Vitals/Pain Pain Assessment: No/denies pain     Hand Dominance Right   Extremity/Trunk Assessment Upper Extremity Assessment Upper Extremity Assessment: Overall WFL for tasks assessed   Lower Extremity Assessment Lower Extremity Assessment: Overall WFL for tasks assessed   Cervical / Trunk Assessment Cervical / Trunk Assessment: Normal   Communication Communication Communication: No difficulties   Cognition Arousal/Alertness: Awake/alert Behavior During Therapy: WFL for tasks assessed/performed Overall Cognitive Status: Within Functional Limits for tasks assessed  General Comments  BP: 144/70 after Epley's maneuver and first activity OOB.    Exercises Exercises: Other exercises(her out patient PT vestibular exercises )   Shoulder Instructions      Home Living Family/patient expects to be discharged to:: Private residence Living Arrangements: Children Available Help at Discharge: Family Type of Home: House Home Access: Level entry     Home Layout: Multi-level;Full bath on main level;Able to live on main level with bedroom/bathroom     Bathroom Shower/Tub: Teacher, early years/pre: Standard     Home Equipment: Environmental consultant - 2 wheels;Cane - single point;Bedside commode;Shower seat;Grab bars -  toilet;Grab bars - tub/shower;Hand held shower head          Prior Functioning/Environment Level of Independence: Independent with assistive device(s)        Comments: pt bathes when family was present at home and drives onyl when not dizzy.        OT Problem List:        OT Treatment/Interventions:      OT Goals(Current goals can be found in the care plan section) Acute Rehab OT Goals Patient Stated Goal: to go home to see my grandsons in town OT Goal Formulation: All assessment and education complete, DC therapy Potential to Achieve Goals: Good  OT Frequency:     Barriers to D/C:            Co-evaluation              AM-PAC OT "6 Clicks" Daily Activity     Outcome Measure Help from another person eating meals?: None Help from another person taking care of personal grooming?: None Help from another person toileting, which includes using toliet, bedpan, or urinal?: None Help from another person bathing (including washing, rinsing, drying)?: A Little Help from another person to put on and taking off regular upper body clothing?: None Help from another person to put on and taking off regular lower body clothing?: None 6 Click Score: 23   End of Session Equipment Utilized During Treatment: Gait belt;Rolling walker Nurse Communication: Mobility status  Activity Tolerance: Patient tolerated treatment well Patient left: in chair;with call bell/phone within reach;with chair alarm set  OT Visit Diagnosis: Unsteadiness on feet (R26.81);Repeated falls (R29.6)                Time: 0820-0910 OT Time Calculation (min): 50 min Charges:  OT General Charges $OT Visit: 1 Visit OT Evaluation $OT Eval High Complexity: 1 High OT Treatments $Self Care/Home Management : 8-22 mins $Therapeutic Activity: 8-22 mins  Ebony Hail Harold Hedge) Marsa Aris OTR/L Acute Rehabilitation Services Pager: [phone] Office: [phone]   Fredda Hammed 05/14/2018, 9:34 AM

## 2018-05-14 NOTE — Progress Notes (Signed)
Pt's heart rate is dropping in the 40's and sustaining in the 50's. Pt is asymptomatic. MD on call updated and ordered to hold the Carvedilol due tonight. Will continue to monitor pt.

## 2018-05-14 NOTE — Progress Notes (Signed)
ANTICOAGULATION CONSULT NOTE - Follow up [facility] for heparin Indication: atrial fibrillation/NSTEMI  Allergies  Allergen Reactions  . Lisinopril     ? Angioedema (NOT ARB candidate) Because of a history of documented adverse serious drug reaction;Medi Alert bracelet  is recommended  . Hctz [Hydrochlorothiazide]     hyponatremia  . Spironolactone     REACTION: low potassium ???  . Penicillins Other (See Comments)    Pt states medication doesn't work for her d/t her usage of it several years ago.  . Amlodipine Besylate     REACTION: edema    Patient Measurements: Height: 5' 7"  (170.2 cm) Weight: 210 lb 12.2 oz (95.6 kg) IBW/kg (Calculated) : 61.6  HEPARIN DW (KG): 82.6  Vital Signs: Temp: 97.7 F (36.5 C) (12/26 0627) Temp Source: Oral (12/26 0627) BP: 148/68 (12/26 0627) Pulse Rate: 50 (12/26 0627)  Labs: Recent Labs    05/12/18 1903  05/12/18 2304 05/13/18 0226 05/13/18 0619 05/13/18 1234 05/14/18 0334  HGB  --    < > 10.2*  --  9.4*  --  8.7*  HCT  --   --  32.7*  --  29.8*  --  27.7*  PLT  --   --  277  --  274  --  256  LABPROT 28.2*  --   --   --  26.1*  --  24.2*  INR 2.69  --   --   --  2.43  --  2.21  CREATININE  --   --  0.94  --  0.96  --  1.22*  TROPONINI  --   --   --  1.00* 1.46* 1.15*  --    < > = values in this interval not displayed.    Estimated Creatinine Clearance: 45.1 mL/min (A) (by C-G formula based on SCr of 1.22 mg/dL (H)).   Medical History: Past Medical History:  Diagnosis Date  . Anemia 12/18/2011  . Atrial fibrillation ([place]) 04/13/2011  . Cancer [facility])    breast CA s/p Right mastectomy  . Diabetes mellitus, type 2 (HCC)    diet controlled  . Hx of echocardiogram    Echocardiogram (12/15): Moderate LVH, EF 55-60%, normal wall motion, grade 1 diastolic dysfunction, aortic sclerosis without stenosis, mild AI, MAC  . Hyperlipidemia   . Hypertension    medicine refractory  . Hypothyroidism    s/p thryroid  gland ablation  . Neuromuscular disorder ([place])   . NSTEMI (non-ST elevated myocardial infarction) ([place]) 05/13/2018  . Osteopenia   . Renal insufficiency, mild 09/23/2013    Assessment: 77 yo female on warfarin for atrial fibrillation prior to admission, presenting with fall and head injury. Coumadin PTA dose 2.5 mg on Friday, 5 mg other days, and last dose 12/23. Pharmacy has been consulted for heparin dosing for NSTEMI.  INR trending down appropriately after holding doses of warfarin but remains therapeutic at 2.21 this morning. CBC trending down but no documented bleeding, and head CT negative for bleed. Will continue to hold warfarin and recheck INR tomorrow. Will start heparin infusion for NSTEMI once INR < 2.0  Goal of Therapy:  INR 2-3 Monitor platelets by anticoagulation protocol: Yes   Plan:  Daily Protime/INR Start heparin when INR <2.0 Monitor for bleeding complications   Brendolyn Patty, PharmD PGY1 Pharmacy Resident Phone [phone]  05/14/2018   7:42 AM

## 2018-05-14 NOTE — Evaluation (Signed)
Physical Therapy Evaluation Patient Details Name: Gloria Joseph MRN: [id number] DOB: 05-10-1941 Today's Date: 05/14/2018   History of Present Illness  pt is a 77 yo female s/p fall hitting R side of head, CT head negative and MRI brain negative for any acute events. pt with vertigo and PMHx: includes chronic vertigo, R mastectomy, edema, Afib, DMT2, HLD, HTN, HCC, obesity, edema, depression  Clinical Impression  Pt admitted with above diagnosis. Pt currently with functional limitations due to the deficits listed below (see PT Problem List). Pt was able to ambulate In room with RW with good stability.  Performed Epley for left anterior canal BPPV.  Suspect left hypofuntion as well therefore gave pt exercise program and recommend Outpt f/u for vestibular rehab.  Pt will benefit from skilled PT to increase their independence and safety with mobility to allow discharge to the venue listed below.      Follow Up Recommendations Outpatient PT vestibular rehab;Supervision - Intermittent    Equipment Recommendations  None recommended by PT    Recommendations for Other Services       Precautions / Restrictions Precautions Precautions: None Restrictions Weight Bearing Restrictions: No      Mobility  Bed Mobility Overal bed mobility: Modified Independent                Transfers Overall transfer level: Modified independent Equipment used: Rolling walker (2 wheeled);1 person hand held assist(depending on if any dizziness- supervision advised)                Ambulation/Gait Ambulation/Gait assistance: Min guard Gait Distance (Feet): 15 Feet Assistive device: 1 person hand held assist Gait Pattern/deviations: Step-through pattern;Decreased stride length     General Gait Details: slow guarded gait with +1 HHA for stability to walk to bathroom.  does better with RW  Stairs            Wheelchair Mobility    Modified Rankin (Stroke Patients Only)        Balance Overall balance assessment: Mild deficits observed, not formally tested;History of Falls                                           Pertinent Vitals/Pain Pain Assessment: No/denies pain    Home Living Family/patient expects to be discharged to:: Private residence Living Arrangements: Children Available Help at Discharge: Family Type of Home: House Home Access: Level entry     Home Layout: Multi-level;Full bath on main level;Able to live on main level with bedroom/bathroom Home Equipment: Gilford Rile - 2 wheels;Cane - single point;Bedside commode;Shower seat;Grab bars - toilet;Grab bars - tub/shower;Hand held shower head      Prior Function Level of Independence: Independent with assistive device(s)         Comments: pt bathes when family was present at home and drives onyl when not dizzy.     Hand Dominance   Dominant Hand: Right    Extremity/Trunk Assessment   Upper Extremity Assessment Upper Extremity Assessment: Defer to OT evaluation    Lower Extremity Assessment Lower Extremity Assessment: Overall WFL for tasks assessed    Cervical / Trunk Assessment Cervical / Trunk Assessment: Normal  Communication   Communication: No difficulties  Cognition Arousal/Alertness: Awake/alert Behavior During Therapy: WFL for tasks assessed/performed Overall Cognitive Status: Within Functional Limits for tasks assessed  General Comments General comments (skin integrity, edema, etc.): Pt positive for left anterior canal BPPV.  Treated wtih Epley manuever.  Also with issues with gaze stability.      Exercises Other Exercises Other Exercises: Access Code: [email or web address]  sent to pts email  Other Exercises: Nestor Lewandowsky and x1 exercise with progression   Assessment/Plan    PT Assessment Patient needs continued PT services  PT Problem List Decreased activity tolerance;Decreased balance;Decreased  mobility;Decreased knowledge of use of DME;Decreased safety awareness;Decreased knowledge of precautions;Cardiopulmonary status limiting activity(BPPV, gaze stability issues)       PT Treatment Interventions DME instruction;Gait training;Functional mobility training;Therapeutic activities;Therapeutic exercise;Balance training;Patient/family education(gaze stability exercises, Epley, Nestor Lewandowsky)    PT Goals (Current goals can be found in the Care Plan section)  Acute Rehab PT Goals Patient Stated Goal: to go home to see my grandsons in town PT Goal Formulation: With patient Time For Goal Achievement: 05/28/18 Potential to Achieve Goals: Good    Frequency Min 3X/week   Barriers to discharge        Co-evaluation               AM-PAC PT "6 Clicks" Mobility  Outcome Measure Help needed turning from your back to your side while in a flat bed without using bedrails?: None Help needed moving from lying on your back to sitting on the side of a flat bed without using bedrails?: None Help needed moving to and from a bed to a chair (including a wheelchair)?: None Help needed standing up from a chair using your arms (e.g., wheelchair or bedside chair)?: None Help needed to walk in hospital room?: A Little Help needed climbing 3-5 steps with a railing? : A Little 6 Click Score: 22    End of Session Equipment Utilized During Treatment: Gait belt Activity Tolerance: Patient limited by fatigue(limited by dizziness) Patient left: Other (comment);with call bell/phone within reach(on toilet to pull call bell) Nurse Communication: Mobility status PT Visit Diagnosis: Unsteadiness on feet (R26.81);BPPV BPPV - Right/Left : Left    Time: 9030-0923 PT Time Calculation (min) (ACUTE ONLY): 15 min   Charges:   PT Evaluation $PT Eval Moderate Complexity: [facility] Pager:  [phone]  Office:  [place] 05/14/2018, 3:04 PM

## 2018-05-14 NOTE — Progress Notes (Addendum)
Progress Note  Patient Name: Gloria Joseph Date of Encounter: 05/14/2018  Primary Cardiologist: Thompson Grayer, MD   Subjective   No chest pain or SOB.  She had no chest pain or dyspnea prior to admission.  No PND or orthopnea.  No palpitations  Inpatient Medications    Scheduled Meds: . atorvastatin  80 mg Oral q1800  . carvedilol  3.125 mg Oral BID  . cholecalciferol  1,000 Units Oral Daily  . citalopram  20 mg Oral Daily  . hydrALAZINE  150 mg Oral BID  . insulin aspart  0-5 Units Subcutaneous QHS  . insulin aspart  0-9 Units Subcutaneous TID WC  . levothyroxine  112 mcg Oral Q0600  . losartan  100 mg Oral Daily  . multivitamin with minerals  1 tablet Oral Daily  . omega-3 acid ethyl esters  1 g Oral Daily  . vitamin B-12  1,000 mcg Oral Daily   Continuous Infusions:  PRN Meds: acetaminophen **OR** acetaminophen, hydrALAZINE, meclizine, nitroGLYCERIN, ondansetron **OR** ondansetron (ZOFRAN) IV   Vital Signs    Vitals:   05/13/18 1059 05/13/18 1509 05/13/18 2007 05/14/18 0627  BP: (!) 143/68 103/64 109/86 (!) 148/68  Pulse: 70  60 (!) 50  Resp:   16 14  Temp:  98.9 F (37.2 C) 98.9 F (37.2 C) 97.7 F (36.5 C)  TempSrc:  Oral Oral Oral  SpO2:  95% 96% 100%  Weight:      Height:        Intake/Output Summary (Last 24 hours) at 05/14/2018 0904 Last data filed at 05/14/2018 0200 Gross per 24 hour  Intake 662 ml  Output 450 ml  Net 212 ml   Filed Weights   05/12/18 1847 05/13/18 0252  Weight: 89.5 kg 95.6 kg    Telemetry    SR at 50-60s (T wave inversions still noted_ personally Reviewed  ECG    05/13/18- SR at rate of 70 bpm, TWI in lateral leads  - Personally Reviewed  Physical Exam   GEN: No acute distress.   Neck: No JVD Cardiac: RRR, no murmurs, rubs, or gallops.  Respiratory: Clear to auscultation bilaterally. GI: Soft, nontender, non-distended  MS: No edema; No deformity. Neuro:  Nonfocal  Psych: Normal affect   Labs      Chemistry Recent Labs  Lab 05/12/18 2304 05/13/18 0619 05/14/18 0334  NA 134* 136 135  K 3.8 3.5 4.1  CL 102 100 102  CO2 22 24 25   GLUCOSE 142* 108* 123*  BUN 13 12 16   CREATININE 0.94 0.96 1.22*  CALCIUM 9.2 8.8* 8.9  GFRNONAA 58* 57* 42*  GFRAA >60 >60 49*  ANIONGAP 10 12 8      Hematology Recent Labs  Lab 05/12/18 2304 05/13/18 0619 05/14/18 0334  WBC 7.5 5.6 5.0  RBC 3.59* 3.36* 3.08*  HGB 10.2* 9.4* 8.7*  HCT 32.7* 29.8* 27.7*  MCV 91.1 88.7 89.9  MCH 28.4 28.0 28.2  MCHC 31.2 31.5 31.4  RDW 14.8 14.6 15.0  PLT 277 274 256    Cardiac Enzymes Recent Labs  Lab 05/13/18 0226 05/13/18 0619 05/13/18 1234  TROPONINI 1.00* 1.46* 1.15*   Radiology    Dg Chest 1 View  Result Date: 05/13/2018 CLINICAL DATA:  Evaluate for possible cardiac pacemaker device. History of atrial fibrillation, RIGHT breast cancer. EXAM: CHEST  1 VIEW COMPARISON:  Chest radiograph April 10, 2011 FINDINGS: Cardiac silhouette is moderately enlarged. Interstitial prominence without pleural effusion or focal consolidation. LEFT mid lung zone  atelectasis/scarring. Calcified aortic arch. No pneumothorax. Surgical clips RIGHT axilla. No radiopaque foreign bodies, specifically no cardiac pacemaker/AICD. Osteopenia. IMPRESSION: 1. No radiopaque foreign bodies, no cardiac pacemaker/AICD. 2. Cardiomegaly.  Interstitial prominence may be chronic. 3.  Aortic Atherosclerosis (ICD10-I70.0). Electronically Signed   By: Elon Alas M.D.   On: 05/13/2018 00:03   Ct Head Wo Contrast  Result Date: 05/13/2018 CLINICAL DATA:  Tripped and fell hitting head on cement. EXAM: CT HEAD WITHOUT CONTRAST TECHNIQUE: Contiguous axial images were obtained from the base of the skull through the vertex without intravenous contrast. COMPARISON:  05/12/2018 FINDINGS: Brain: There is no evidence for acute hemorrhage, hydrocephalus, mass lesion, or abnormal extra-axial fluid collection. No definite CT evidence for acute  infarction. Diffuse loss of parenchymal volume is consistent with atrophy. Patchy low attenuation in the deep hemispheric and periventricular white matter is nonspecific, but likely reflects chronic microvascular ischemic demyelination. Vascular: No hyperdense vessel or unexpected calcification. Skull: No evidence for fracture. No worrisome lytic or sclerotic lesion. Sinuses/Orbits: The visualized paranasal sinuses and mastoid air cells are clear. Visualized portions of the globes and intraorbital fat are unremarkable. Other: None. IMPRESSION: Stable. Atrophy with chronic small vessel white matter ischemic disease. No acute abnormality. Electronically Signed   By: Misty Stanley M.D.   On: 05/13/2018 13:11   Ct Head Wo Contrast  Result Date: 05/12/2018 CLINICAL DATA:  Recent trip and fall with headaches EXAM: CT HEAD WITHOUT CONTRAST TECHNIQUE: Contiguous axial images were obtained from the base of the skull through the vertex without intravenous contrast. COMPARISON:  04/11/2018 FINDINGS: Brain: Mild chronic white matter ischemic changes are seen. No findings to suggest acute hemorrhage, acute infarction or space-occupying mass lesion are noted. Basal ganglia calcifications are again noted. Vascular: No hyperdense vessel or unexpected calcification. Skull: Normal. Negative for fracture or focal lesion. Sinuses/Orbits: No acute finding. Other: None. IMPRESSION: Chronic white matter ischemic changes without acute abnormality. Electronically Signed   By: Inez Catalina M.D.   On: 05/12/2018 20:50   Mr Brain Wo Contrast  Result Date: 05/13/2018 CLINICAL DATA:  Episodic cancer vertigo,. History of breast hypertension and hyperlipidemia. EXAM: MRI HEAD WITHOUT CONTRAST TECHNIQUE: Multiplanar, multiecho pulse sequences of the brain and surrounding structures were obtained without intravenous contrast. COMPARISON:  CT HEAD May 12, 2018 FINDINGS: Moderate to severely motion degraded examination. INTRACRANIAL  CONTENTS: No reduced diffusion to suggest acute ischemia. Chronic microhemorrhage LEFT basal ganglia in addition to basal ganglia mineralization. The ventricles and sulci are normal for patient's age. Confluent supratentorial and pontine white matter FLAIR T2 hyperintensities. Hazy T2 hyperintense signal bilateral basal ganglia and prominent basal ganglia perivascular spaces associated chronic small vessel ischemic changes. No midline shift, mass effect or masses. No abnormal extra-axial fluid collections. No extra-axial masses. VASCULAR: Normal major intracranial vascular flow voids present at skull base. SKULL AND UPPER CERVICAL SPINE: No abnormal sellar expansion. No suspicious calvarial bone marrow signal. Craniocervical junction maintained. SINUSES/ORBITS: Trace mastoid effusion. Paranasal sinuses are well aerated. Included ocular globes and orbital contents are non-suspicious. Status post bilateral ocular lens implants. OTHER: None. IMPRESSION: 1. Motion degraded examination.  No acute intracranial process. 2. Severe chronic small vessel ischemic changes, there may be a component of hypertensive encephalopathy. Electronically Signed   By: Elon Alas M.D.   On: 05/13/2018 00:47    Cardiac Studies   Echo 05/13/18 Study Conclusions  - Left ventricle: The cavity size was mildly dilated. Wall   thickness was increased in a pattern of mild LVH. Systolic  function was severely reduced. The estimated ejection fraction   was in the range of 20% to 25%. There is akinesis of the   apicalanteroseptal, anterior, and inferior myocardium. Doppler   parameters are consistent with abnormal left ventricular   relaxation (grade 1 diastolic dysfunction). Doppler parameters   are consistent with high ventricular filling pressure. - Aortic valve: There was mild regurgitation. - Left atrium: The atrium was mildly dilated. - Right ventricle: The cavity size was mildly dilated. Systolic   function was  mildly to moderately reduced. - Pulmonary arteries: PA peak pressure: 45 mm Hg (S). - Pericardium, extracardiac: A trivial pericardial effusion was   identified posterior to the heart.  Patient Profile     INICE SANLUIS is a 77 y.o. female with a hx of hypertension, hyperlipidemia, diabetes mellitus, hypothyroidism, depression, Paroxysmal atrial fibrillation on Coumadin, bradycardia,  chronic anemia, breast cancer (right mastectomy) and memory issue who presented after mechanical fall and  seen for the evaluation of NSTEMI at the request of Dr. Blaine Hamper.  Admitted 12/2017 at [facility] for hematuria. Incidentally found to have renal mall. Seen by urology and underwent outpatient cystoscopy and MRI without significant founding.   Assessment & Plan    1. Elevated Troponin- ? True NSTEMI vs. Demand Ischemia  - Troponin peaked at 1.46. Demand ischemia vs ACS. She lives with husband and sob. Does house hold chores without any issue. No fatigue, CP or dyspnea. EKG this admission showed new TWI in lateral leads. Echo showed newly reduced LV function 20-25%, kinesis of the apicalanteroseptal, anterior, and inferior myocardium. Grade 1 DD and trivial posterior pericardial effusion. Cardiac risk factors includes HTN, DM and HLD. She will need ischemic evaluation this admission. Likely cath otherwise coronary CT. Will discuss with MD.   Reviewing echo has an appearance that is somewhat concerning for Takotsubo type cardiomyopathy.  However, with elevated troponins and deep T wave inversions and abnormal echocardiogram, will we do need to evaluate for ischemia.  Most definitive evaluation would be cardiac catheterization, however if does not antibiotic happen because of her elevated INR being on warfarin, we could consider coronary CTA as a second option.  Will discuss with imaging colleagues as the feasibility of getting this done.  2. Paroxysmal atrial fibrillation - Sinus rhythm here. Hx of  bradycardia in past. Rate stable on low dose of coreg. Coumadin on hold currently. No bleeding on repeat head CT.  3. Chronic combined CHF - Echo with newly reduced LVEF. Euvolemic. Continue BB and ARB. Follow. As above.   4. Mechanical fall - Repeat CT negative for bleeding. Start heparin once INR less than 2 if okay with primary team. Will defer neuro evaluation per primary. Admits memory issue.   5. HTN - BP improving  6. DM - Per primary team   7. HLD - 05/13/2018: Cholesterol 116; HDL 46; LDL Cholesterol 49; Triglycerides 103; VLDL 21  - Continue statin   8. Chronic anemia - Patient says she had negative urological and GI work up this year. Says hemoglobin runs chronically low. Not on any supplement. Denies melena or blood in stool. Per primary team.  - HGb 10.2>>9.4>>8.7 today.       SignedCrista Luria Bruneau, PA  05/14/2018, 9:04 AM     I have seen, examined and evaluated the patient this PM along with Mr. Curly [place], [place].  After reviewing all the available data and chart, we discussed the patients laboratory, study & physical findings as well as symptoms in  detail. I agree with his findings, examination as well as impression recommendations as per our discussion.    Attending adjustments noted in italics.   Interesting scenario with abnormal EKG, abnormal echocardiogram and elevated troponins in a patient who has had no chest pain or pressure.  The presentation seems to be very consistent with a stressful fall and trauma with head injury (even mild) that triggered a stress response with stress-induced cardiomyopathy (Takotsubo).  However these through the same 3 features are somewhat concerning also for possible ACS.  We do need to exclude ischemic CAD as an etiology for her reduced EF/cardiomyopathy. She is not having any heart failure symptoms. -For her cardiomyopathy she is already on carvedilol, losartan and hydralazine. In the absence of any heart failure symptoms,  does not require diuretic.  We will discuss with interventional colleagues and imaging colleagues as to the feasibility of evaluating her cardiomyopathy with coronary CTA versus cardiac cath based on the fact that her INR still elevated.    Glenetta Hew, M.D., M.S. Interventional Cardiologist   Pager # [phone] Phone # [phone] [address] [place], [place] [address]  For questions or updates, please contact [place] Please consult [email or web address] for contact info under

## 2018-05-15 ENCOUNTER — Inpatient Hospital Stay (HOSPITAL_COMMUNITY): Payer: Medicare Other | Admitting: Certified Registered"

## 2018-05-15 ENCOUNTER — Inpatient Hospital Stay (HOSPITAL_COMMUNITY): Payer: Medicare Other

## 2018-05-15 ENCOUNTER — Encounter (HOSPITAL_COMMUNITY): Payer: Self-pay | Admitting: Primary Care

## 2018-05-15 ENCOUNTER — Encounter (HOSPITAL_COMMUNITY)
Admission: EM | Disposition: A | Discharge status: Rehabilitation Facility | Payer: Self-pay | Source: Home / Self Care | Attending: Neurology

## 2018-05-15 DIAGNOSIS — J988 Other specified respiratory disorders: Secondary | ICD-10-CM

## 2018-05-15 DIAGNOSIS — I214 Non-ST elevation (NSTEMI) myocardial infarction: Secondary | ICD-10-CM

## 2018-05-15 DIAGNOSIS — E039 Hypothyroidism, unspecified: Secondary | ICD-10-CM

## 2018-05-15 DIAGNOSIS — I428 Other cardiomyopathies: Secondary | ICD-10-CM

## 2018-05-15 DIAGNOSIS — G9341 Metabolic encephalopathy: Secondary | ICD-10-CM

## 2018-05-15 DIAGNOSIS — I6602 Occlusion and stenosis of left middle cerebral artery: Secondary | ICD-10-CM | POA: Diagnosis not present

## 2018-05-15 DIAGNOSIS — I5181 Takotsubo syndrome: Secondary | ICD-10-CM | POA: Diagnosis present

## 2018-05-15 DIAGNOSIS — Z7901 Long term (current) use of anticoagulants: Secondary | ICD-10-CM

## 2018-05-15 DIAGNOSIS — R42 Dizziness and giddiness: Secondary | ICD-10-CM

## 2018-05-15 DIAGNOSIS — I639 Cerebral infarction, unspecified: Secondary | ICD-10-CM

## 2018-05-15 HISTORY — PX: CORONARY ANGIOGRAPHY: CATH118303

## 2018-05-15 HISTORY — DX: Cerebral infarction, unspecified: I63.9

## 2018-05-15 HISTORY — PX: RADIOLOGY WITH ANESTHESIA: SHX6223

## 2018-05-15 HISTORY — PX: IR ANGIO VERTEBRAL SEL SUBCLAVIAN INNOMINATE UNI R MOD SED: IMG5365

## 2018-05-15 HISTORY — PX: IR ANGIO INTRA EXTRACRAN SEL COM CAROTID INNOMINATE BILAT MOD SED: IMG5360

## 2018-05-15 LAB — GLUCOSE, CAPILLARY
Glucose-Capillary: 114 mg/dL — ABNORMAL HIGH (ref 70–99)
Glucose-Capillary: 99 mg/dL (ref 70–99)
Glucose-Capillary: 89 mg/dL (ref 70–99)
Glucose-Capillary: 145 mg/dL — ABNORMAL HIGH (ref 70–99)

## 2018-05-15 LAB — POCT I-STAT 7, (LYTES, BLD GAS, ICA,H+H)
Acid-Base Excess: 1 mmol/L (ref 0.0–2.0)
BICARBONATE: 25 mmol/L (ref 20.0–28.0)
Calcium, Ion: 1.17 mmol/L (ref 1.15–1.40)
HCT: 27 % — ABNORMAL LOW (ref 36.0–46.0)
Hemoglobin: 9.2 g/dL — ABNORMAL LOW (ref 12.0–15.0)
O2 Saturation: 97 %
Patient temperature: 96.7
Potassium: 4.1 mmol/L (ref 3.5–5.1)
Sodium: 137 mmol/L (ref 135–145)
TCO2: 26 mmol/L (ref 22–32)
pCO2 arterial: 33.7 mmHg (ref 32.0–48.0)
pH, Arterial: 7.474 — ABNORMAL HIGH (ref 7.350–7.450)
pO2, Arterial: 80 mmHg — ABNORMAL LOW (ref 83.0–108.0)

## 2018-05-15 LAB — BASIC METABOLIC PANEL
Anion gap: 13 (ref 5–15)
BUN: 19 mg/dL (ref 8–23)
CO2: 22 mmol/L (ref 22–32)
Calcium: 9.3 mg/dL (ref 8.9–10.3)
Chloride: 104 mmol/L (ref 98–111)
Creatinine, Ser: 1.25 mg/dL — ABNORMAL HIGH (ref 0.44–1.00)
GFR calc non Af Amer: 41 mL/min — ABNORMAL LOW (ref >60)
GFR, EST AFRICAN AMERICAN: 48 mL/min — AB (ref >60)
Glucose, Bld: 132 mg/dL — ABNORMAL HIGH (ref 70–99)
POTASSIUM: 4.1 mmol/L (ref 3.5–5.1)
Sodium: 139 mmol/L (ref 135–145)

## 2018-05-15 LAB — TRIGLYCERIDES: TRIGLYCERIDES: 136 mg/dL (ref <150)

## 2018-05-15 LAB — CBC
HEMATOCRIT: 30.3 % — AB (ref 36.0–46.0)
Hemoglobin: 9.2 g/dL — ABNORMAL LOW (ref 12.0–15.0)
MCH: 27.5 pg (ref 26.0–34.0)
MCHC: 30.4 g/dL (ref 30.0–36.0)
MCV: 90.7 fL (ref 80.0–100.0)
Platelets: 275 10*3/uL (ref 150–400)
RBC: 3.34 MIL/uL — ABNORMAL LOW (ref 3.87–5.11)
RDW: 14.9 % (ref 11.5–15.5)
WBC: 5.2 10*3/uL (ref 4.0–10.5)
nRBC: 0 % (ref 0.0–0.2)

## 2018-05-15 LAB — PROTIME-INR
INR: 1.57
INR: 1.51
Prothrombin Time: 18.6 seconds — ABNORMAL HIGH (ref 11.4–15.2)
Prothrombin Time: 18 seconds — ABNORMAL HIGH (ref 11.4–15.2)

## 2018-05-15 LAB — MRSA PCR SCREENING: MRSA by PCR: NEGATIVE

## 2018-05-15 LAB — APTT: aPTT: 136 seconds — ABNORMAL HIGH (ref 24–36)

## 2018-05-15 SURGERY — CORONARY ANGIOGRAPHY (CATH LAB)
Anesthesia: LOCAL

## 2018-05-15 SURGERY — IR WITH ANESTHESIA
Anesthesia: General

## 2018-05-15 MED ORDER — GLYCOPYRROLATE 0.2 MG/ML IJ SOLN
INTRAMUSCULAR | Status: DC | PRN
  Administered 2018-05-15: 0.2 mg via INTRAVENOUS

## 2018-05-15 MED ORDER — ROCURONIUM BROMIDE 50 MG/5ML IV SOSY
PREFILLED_SYRINGE | INTRAVENOUS | Status: DC | PRN
  Administered 2018-05-15: 50 mg via INTRAVENOUS

## 2018-05-15 MED ORDER — STROKE: EARLY STAGES OF RECOVERY BOOK
Freq: Once | Status: AC
  Administered 2018-05-16: 02:00:00
  Filled 2018-05-15 (×2): qty 1

## 2018-05-15 MED ORDER — FENTANYL 2500MCG IN NS 250ML (10MCG/ML) PREMIX INFUSION
0.0000 ug/h | INTRAVENOUS | Status: DC
  Administered 2018-05-15: 100 ug/h via INTRAVENOUS
  Administered 2018-05-16 – 2018-05-17 (×2): 200 ug/h via INTRAVENOUS
  Filled 2018-05-15 (×3): qty 250

## 2018-05-15 MED ORDER — IOHEXOL 300 MG/ML  SOLN
150.0000 mL | Freq: Once | INTRAMUSCULAR | Status: AC | PRN
  Administered 2018-05-15: 60 mL via INTRA_ARTERIAL

## 2018-05-15 MED ORDER — HEPARIN (PORCINE) IN NACL 1000-0.9 UT/500ML-% IV SOLN
INTRAVENOUS | Status: DC | PRN
  Administered 2018-05-15: 500 mL

## 2018-05-15 MED ORDER — MIDAZOLAM HCL 2 MG/2ML IJ SOLN
INTRAMUSCULAR | Status: AC
  Filled 2018-05-15: qty 2

## 2018-05-15 MED ORDER — IOPAMIDOL (ISOVUE-370) INJECTION 76%
INTRAVENOUS | Status: AC
  Administered 2018-05-15: 75 mL
  Filled 2018-05-15: qty 100

## 2018-05-15 MED ORDER — SODIUM CHLORIDE 0.9 % IV SOLN
INTRAVENOUS | Status: DC
  Administered 2018-05-15: 09:00:00 via INTRAVENOUS

## 2018-05-15 MED ORDER — NITROGLYCERIN 1 MG/10 ML FOR IR/CATH LAB
INTRA_ARTERIAL | Status: AC
  Filled 2018-05-15: qty 10

## 2018-05-15 MED ORDER — CLOPIDOGREL BISULFATE 300 MG PO TABS
ORAL_TABLET | ORAL | Status: AC
  Filled 2018-05-15: qty 1

## 2018-05-15 MED ORDER — ORAL CARE MOUTH RINSE
15.0000 mL | OROMUCOSAL | Status: DC
  Administered 2018-05-16 – 2018-05-18 (×21): 15 mL via OROMUCOSAL

## 2018-05-15 MED ORDER — SODIUM CHLORIDE 0.9% FLUSH: 3.0000 mL | Freq: Two times a day (BID) | INTRAVENOUS | Status: DC

## 2018-05-15 MED ORDER — CLEVIDIPINE BUTYRATE 0.5 MG/ML IV EMUL
0.0000 mg/h | INTRAVENOUS | Status: DC
  Administered 2018-05-15: 1 mg/h via INTRAVENOUS
  Administered 2018-05-16: 6 mg/h via INTRAVENOUS
  Filled 2018-05-15 (×2): qty 50

## 2018-05-15 MED ORDER — LIDOCAINE HCL (PF) 1 % IJ SOLN
INTRAMUSCULAR | Status: DC | PRN
  Administered 2018-05-15: 2 mL

## 2018-05-15 MED ORDER — ASPIRIN 325 MG PO TABS
ORAL_TABLET | ORAL | Status: AC
  Filled 2018-05-15: qty 1

## 2018-05-15 MED ORDER — SODIUM CHLORIDE 0.9% FLUSH: 3.0000 mL | INTRAVENOUS | Status: DC | PRN

## 2018-05-15 MED ORDER — VANCOMYCIN HCL IN DEXTROSE 1-5 GM/200ML-% IV SOLN
INTRAVENOUS | Status: AC
  Filled 2018-05-15: qty 200

## 2018-05-15 MED ORDER — MIDAZOLAM HCL 2 MG/2ML IJ SOLN
2.0000 mg | INTRAMUSCULAR | Status: DC | PRN
  Administered 2018-05-15 – 2018-05-17 (×4): 2 mg via INTRAVENOUS
  Filled 2018-05-15 (×4): qty 2

## 2018-05-15 MED ORDER — PROTAMINE SULFATE 10 MG/ML IV SOLN
50.0000 mg | Freq: Once | INTRAVENOUS | Status: DC
  Filled 2018-05-15: qty 5

## 2018-05-15 MED ORDER — TICAGRELOR 90 MG PO TABS
ORAL_TABLET | ORAL | Status: AC
  Filled 2018-05-15: qty 2

## 2018-05-15 MED ORDER — HEPARIN (PORCINE) 25000 UT/250ML-% IV SOLN
1000.0000 [IU]/h | INTRAVENOUS | Status: DC
  Administered 2018-05-15: 1000 [IU]/h via INTRAVENOUS
  Filled 2018-05-15: qty 250

## 2018-05-15 MED ORDER — LIDOCAINE HCL 1 % IJ SOLN
INTRAMUSCULAR | Status: AC
  Filled 2018-05-15: qty 20

## 2018-05-15 MED ORDER — LIDOCAINE HCL (PF) 1 % IJ SOLN
INTRAMUSCULAR | Status: AC
  Filled 2018-05-15: qty 30

## 2018-05-15 MED ORDER — SODIUM CHLORIDE 0.9 % IV SOLN
INTRAVENOUS | Status: DC | PRN
  Administered 2018-05-15: 19:00:00 via INTRAVENOUS

## 2018-05-15 MED ORDER — FENTANYL CITRATE (PF) 100 MCG/2ML IJ SOLN
INTRAMUSCULAR | Status: AC
  Filled 2018-05-15: qty 2

## 2018-05-15 MED ORDER — SODIUM CHLORIDE 0.9 % IV SOLN
4.0000 mg/h | INTRAVENOUS | Status: DC
  Administered 2018-05-15: 4 mg/h via INTRAVENOUS
  Filled 2018-05-15: qty 10

## 2018-05-15 MED ORDER — SODIUM CHLORIDE 0.9% FLUSH
3.0000 mL | Freq: Two times a day (BID) | INTRAVENOUS | Status: DC
  Administered 2018-05-15: 3 mL via INTRAVENOUS

## 2018-05-15 MED ORDER — WARFARIN - PHARMACIST DOSING INPATIENT: Freq: Every day | Status: DC

## 2018-05-15 MED ORDER — WARFARIN SODIUM 7.5 MG PO TABS: 7.5000 mg | ORAL_TABLET | Freq: Once | ORAL | Status: DC

## 2018-05-15 MED ORDER — MIDAZOLAM HCL 2 MG/2ML IJ SOLN
INTRAMUSCULAR | Status: DC | PRN
  Administered 2018-05-15: 1 mg via INTRAVENOUS

## 2018-05-15 MED ORDER — ACETAMINOPHEN 650 MG RE SUPP
650.0000 mg | RECTAL | Status: DC | PRN
  Administered 2018-05-17: 650 mg via RECTAL
  Filled 2018-05-15: qty 1

## 2018-05-15 MED ORDER — LORAZEPAM 2 MG/ML IJ SOLN
INTRAMUSCULAR | Status: AC
  Administered 2018-05-15: 2 mg
  Filled 2018-05-15: qty 1

## 2018-05-15 MED ORDER — SODIUM CHLORIDE 0.9 % IV SOLN: INTRAVENOUS | Status: DC

## 2018-05-15 MED ORDER — SODIUM CHLORIDE 0.9 % IV SOLN
INTRAVENOUS | Status: DC | PRN
  Administered 2018-05-15: 40 ug/min via INTRAVENOUS

## 2018-05-15 MED ORDER — ASPIRIN 81 MG PO CHEW
81.0000 mg | CHEWABLE_TABLET | ORAL | Status: AC
  Administered 2018-05-15: 81 mg via ORAL
  Filled 2018-05-15: qty 1

## 2018-05-15 MED ORDER — VERAPAMIL HCL 2.5 MG/ML IV SOLN
INTRAVENOUS | Status: DC | PRN
  Administered 2018-05-15: 16:00:00 via INTRA_ARTERIAL

## 2018-05-15 MED ORDER — EPTIFIBATIDE 20 MG/10ML IV SOLN
INTRAVENOUS | Status: AC
  Filled 2018-05-15: qty 10

## 2018-05-15 MED ORDER — SODIUM CHLORIDE 0.9 % IV SOLN: 250.0000 mL | INTRAVENOUS | Status: DC | PRN

## 2018-05-15 MED ORDER — SUCCINYLCHOLINE CHLORIDE 20 MG/ML IJ SOLN
INTRAMUSCULAR | Status: DC | PRN
  Administered 2018-05-15: 140 mg via INTRAVENOUS

## 2018-05-15 MED ORDER — PROPOFOL 1000 MG/100ML IV EMUL: 5.0000 ug/kg/min | INTRAVENOUS | Status: DC

## 2018-05-15 MED ORDER — ACETAMINOPHEN 160 MG/5ML PO SOLN
650.0000 mg | ORAL | Status: DC | PRN
  Administered 2018-05-17: 650 mg
  Filled 2018-05-15: qty 20.3

## 2018-05-15 MED ORDER — HEPARIN SODIUM (PORCINE) 1000 UNIT/ML IJ SOLN
INTRAMUSCULAR | Status: DC | PRN
  Administered 2018-05-15: 4500 [IU] via INTRAVENOUS

## 2018-05-15 MED ORDER — FENTANYL CITRATE (PF) 100 MCG/2ML IJ SOLN
INTRAMUSCULAR | Status: DC | PRN
  Administered 2018-05-15: 25 ug via INTRAVENOUS

## 2018-05-15 MED ORDER — LORAZEPAM 2 MG/ML IJ SOLN
INTRAMUSCULAR | Status: AC
  Filled 2018-05-15: qty 1

## 2018-05-15 MED ORDER — ACETAMINOPHEN 325 MG PO TABS
650.0000 mg | ORAL_TABLET | ORAL | Status: DC | PRN
  Administered 2018-05-18: 650 mg via ORAL
  Filled 2018-05-15 (×2): qty 2

## 2018-05-15 MED ORDER — PROPOFOL 10 MG/ML IV BOLUS
INTRAVENOUS | Status: DC | PRN
  Administered 2018-05-15: 30 mg via INTRAVENOUS
  Administered 2018-05-15: 50 mg via INTRAVENOUS

## 2018-05-15 MED ORDER — CHLORHEXIDINE GLUCONATE 0.12% ORAL RINSE (MEDLINE KIT)
15.0000 mL | Freq: Two times a day (BID) | OROMUCOSAL | Status: DC
  Administered 2018-05-16 – 2018-05-18 (×6): 15 mL via OROMUCOSAL

## 2018-05-15 MED ORDER — SODIUM CHLORIDE 0.9 % IV SOLN
INTRAVENOUS | Status: DC
  Administered 2018-05-15 – 2018-05-17 (×4): via INTRAVENOUS

## 2018-05-15 MED ORDER — SENNOSIDES-DOCUSATE SODIUM 8.6-50 MG PO TABS
1.0000 | ORAL_TABLET | Freq: Every evening | ORAL | Status: DC | PRN
  Administered 2018-05-20: 1 via ORAL
  Filled 2018-05-15: qty 1

## 2018-05-15 MED ORDER — LORAZEPAM 2 MG/ML IJ SOLN
1.0000 mg | Freq: Once | INTRAMUSCULAR | Status: AC
  Administered 2018-05-15: 1 mg via INTRAVENOUS

## 2018-05-15 MED ORDER — TIROFIBAN HCL IN NACL 5-0.9 MG/100ML-% IV SOLN
INTRAVENOUS | Status: AC
  Filled 2018-05-15: qty 100

## 2018-05-15 MED ORDER — ASPIRIN EC 81 MG PO TBEC: 81.0000 mg | DELAYED_RELEASE_TABLET | Freq: Every day | ORAL | Status: DC

## 2018-05-15 MED ORDER — PROPOFOL 500 MG/50ML IV EMUL
INTRAVENOUS | Status: DC | PRN
  Administered 2018-05-15: 25 ug/kg/min via INTRAVENOUS

## 2018-05-15 MED ORDER — VANCOMYCIN HCL 1000 MG IV SOLR
INTRAVENOUS | Status: DC | PRN
  Administered 2018-05-15: 1000 mg via INTRAVENOUS

## 2018-05-15 MED ORDER — LIDOCAINE HCL (CARDIAC) PF 100 MG/5ML IV SOSY
PREFILLED_SYRINGE | INTRAVENOUS | Status: DC | PRN
  Administered 2018-05-15: 60 mg via INTRATRACHEAL

## 2018-05-15 MED ORDER — VERAPAMIL HCL 2.5 MG/ML IV SOLN
INTRAVENOUS | Status: AC
  Filled 2018-05-15: qty 2

## 2018-05-15 MED ORDER — SODIUM CHLORIDE 0.9 % IV SOLN: INTRAVENOUS | Status: AC

## 2018-05-15 MED ORDER — HEPARIN (PORCINE) IN NACL 1000-0.9 UT/500ML-% IV SOLN
INTRAVENOUS | Status: AC
  Filled 2018-05-15: qty 1000

## 2018-05-15 SURGICAL SUPPLY — 11 items
CATH 5FR JL3.5 JR4 ANG PIG MP (CATHETERS) ×1 IMPLANT
DEVICE RAD COMP TR BAND LRG (VASCULAR PRODUCTS) ×1 IMPLANT
GLIDESHEATH SLEND SS 6F .021 (SHEATH) ×1 IMPLANT
GUIDEWIRE INQWIRE 1.5J.035X260 (WIRE) IMPLANT
INQWIRE 1.5J .035X260CM (WIRE) ×2
KIT HEART LEFT (KITS) ×2 IMPLANT
PACK CARDIAC CATHETERIZATION (CUSTOM PROCEDURE TRAY) ×2 IMPLANT
SHEATH GLIDE SLENDER 4/5FR (SHEATH) IMPLANT
TRANSDUCER W/STOPCOCK (MISCELLANEOUS) ×2 IMPLANT
TUBING CIL FLEX 10 FLL-RA (TUBING) ×2 IMPLANT
WIRE HI TORQ VERSACORE-J 145CM (WIRE) ×1 IMPLANT

## 2018-05-15 NOTE — Progress Notes (Addendum)
Progress Note  Patient Name: Gloria Joseph Date of Encounter: 05/15/2018  Primary Cardiologist: Thompson Grayer, MD   Subjective   Feeling well. No chest pain, sob or palpitations.   Inpatient Medications    Scheduled Meds: . atorvastatin  80 mg Oral q1800  . carvedilol  3.125 mg Oral BID  . cholecalciferol  1,000 Units Oral Daily  . citalopram  20 mg Oral Daily  . hydrALAZINE  150 mg Oral BID  . insulin aspart  0-5 Units Subcutaneous QHS  . insulin aspart  0-9 Units Subcutaneous TID WC  . levothyroxine  112 mcg Oral Q0600  . multivitamin with minerals  1 tablet Oral Daily  . omega-3 acid ethyl esters  1 g Oral Daily  . vitamin B-12  1,000 mcg Oral Daily   Continuous Infusions: . heparin     PRN Meds: acetaminophen **OR** acetaminophen, hydrALAZINE, meclizine, nitroGLYCERIN, ondansetron **OR** ondansetron (ZOFRAN) IV   Vital Signs    Vitals:   05/14/18 1143 05/14/18 2202 05/15/18 0300 05/15/18 0500  BP: (!) 125/48 (!) 137/53  (!) 129/57  Pulse: 60 (!) 49  (!) 52  Resp: 16 16  17   Temp: 99.1 F (37.3 C) 98.2 F (36.8 C)  97.9 F (36.6 C)  TempSrc: Oral Oral  Oral  SpO2: 97% 98%  100%  Weight:   93.4 kg   Height:        Intake/Output Summary (Last 24 hours) at 05/15/2018 0748 Last data filed at 05/15/2018 0500 Gross per 24 hour  Intake 720 ml  Output 2000 ml  Net -1280 ml   Filed Weights   05/12/18 1847 05/13/18 0252 05/15/18 0300  Weight: 89.5 kg 95.6 kg 93.4 kg    Telemetry    SR at rate of 50-70s - Personally Reviewed  ECG    None today   Physical Exam   GEN: No acute distress.   Neck: No JVD Cardiac: RRR, no murmurs, rubs, or gallops.  Respiratory: Clear to auscultation bilaterally. GI: Soft, nontender, non-distended  MS: No edema; No deformity. Neuro:  Nonfocal  Psych: Normal affect   Labs    Chemistry Recent Labs  Lab 05/13/18 0619 05/14/18 0334 05/15/18 0512  NA 136 135 139  K 3.5 4.1 4.1  CL 100 102 104  CO2 24 25  22   GLUCOSE 108* 123* 132*  BUN 12 16 19   CREATININE 0.96 1.22* 1.25*  CALCIUM 8.8* 8.9 9.3  GFRNONAA 57* 42* 41*  GFRAA >60 49* 48*  ANIONGAP 12 8 13      Hematology Recent Labs  Lab 05/13/18 0619 05/14/18 0334 05/15/18 0512  WBC 5.6 5.0 5.2  RBC 3.36* 3.08* 3.34*  HGB 9.4* 8.7* 9.2*  HCT 29.8* 27.7* 30.3*  MCV 88.7 89.9 90.7  MCH 28.0 28.2 27.5  MCHC 31.5 31.4 30.4  RDW 14.6 15.0 14.9  PLT 274 256 275    Cardiac Enzymes Recent Labs  Lab 05/13/18 0226 05/13/18 0619 05/13/18 1234  TROPONINI 1.00* 1.46* 1.15*    Radiology    Ct Head Wo Contrast  Result Date: 05/13/2018 CLINICAL DATA:  Tripped and fell hitting head on cement. EXAM: CT HEAD WITHOUT CONTRAST TECHNIQUE: Contiguous axial images were obtained from the base of the skull through the vertex without intravenous contrast. COMPARISON:  05/12/2018 FINDINGS: Brain: There is no evidence for acute hemorrhage, hydrocephalus, mass lesion, or abnormal extra-axial fluid collection. No definite CT evidence for acute infarction. Diffuse loss of parenchymal volume is consistent with atrophy. Patchy low  attenuation in the deep hemispheric and periventricular white matter is nonspecific, but likely reflects chronic microvascular ischemic demyelination. Vascular: No hyperdense vessel or unexpected calcification. Skull: No evidence for fracture. No worrisome lytic or sclerotic lesion. Sinuses/Orbits: The visualized paranasal sinuses and mastoid air cells are clear. Visualized portions of the globes and intraorbital fat are unremarkable. Other: None. IMPRESSION: Stable. Atrophy with chronic small vessel white matter ischemic disease. No acute abnormality. Electronically Signed   By: Misty Stanley M.D.   On: 05/13/2018 13:11    Cardiac Studies   Echo 05/13/18 Study Conclusions  - Left ventricle: The cavity size was mildly dilated. Wall thickness was increased in a pattern of mild LVH. Systolic function was severely reduced.  The estimated ejection fraction was in the range of 20% to 25%. There is akinesis of the apicalanteroseptal, anterior, and inferior myocardium. Doppler parameters are consistent with abnormal left ventricular relaxation (grade 1 diastolic dysfunction). Doppler parameters are consistent with high ventricular filling pressure. - Aortic valve: There was mild regurgitation. - Left atrium: The atrium was mildly dilated. - Right ventricle: The cavity size was mildly dilated. Systolic function was mildly to moderately reduced. - Pulmonary arteries: PA peak pressure: 45 mm Hg (S). - Pericardium, extracardiac: A trivial pericardial effusion was identified posterior to the heart.  Patient Profile     Gloria Joseph a 77 y.o.femalewith a hx of hypertension, hyperlipidemia, diabetes mellitus, hypothyroidism, depression, Paroxysmal atrial fibrillation on Coumadin, bradycardia,  chronic anemia, breast cancer (right mastectomy)and memory issue who presented after mechanical fall and  seen for the evaluation of NSTEMIat the request of Dr. Blaine Hamper.  Admitted 12/2017 at [facility] for hematuria. Incidentally found to have renal mall. Seen by urology and underwent outpatient cystoscopy and MRI without significant founding.   Assessment & Plan    1. Elevated Troponin- ? True NSTEMI vs. Demand Ischemia  - Troponin peaked at 1.46. Demand ischemia vs ACS. She lives with husband and sob. Does house hold chores without any issue. No fatigue, CP or dyspnea. EKG this admission showed new TWI in lateral leads. Echo showed newly reduced LV function 20-25%, kinesis of the apicalanteroseptal, anterior, and inferior myocardium. Grade 1 DD and trivial posterior pericardial effusion. Cardiac risk factors includes HTN, DM and HLD.   Reviewing echo has an appearance that is somewhat concerning for Takotsubo type cardiomyopathy.  However, with elevated troponins and deep T wave inversions and  abnormal echocardiogram, will we do need to evaluate for ischemia.  Will plan coronary angiography today as INR down to 1.57. Heparin started by pharmacy team. The patient understands that risks include but are not limited to stroke (1 in 1000), death (1 in 1000), kidney failure [usually temporary] (1 in 500), bleeding (1 in 200), allergic reaction [possibly serious] (1 in 200), and agrees to proceed.   2. Paroxysmal atrial fibrillation - Sinus rhythm here. Hx of bradycardia in past. HR of 50-70s on Coreg 3.125mg  BID. Coumadin on hold currently>> on heparin.   3. Chronic combined CHF/new cardiomyopathy -with appearance of possible Takotsubo - Echo with newly reduced LVEF. Euvolemic. Continue BB and hydralazine. Held ARB due to rising creatinine.   4. Mechanical fall - Repeat CT negative for bleeding. Per primary team.   5. HTN - BP stable today on current medications.   6. DM - Per primary team   7. HLD - 05/13/2018: Cholesterol 116; HDL 46; LDL Cholesterol 49; Triglycerides 103; VLDL 21  - Continue statin   8. Chronic anemia -  Hgb stable  9. AKI - Scr 0.94>>0.96>>1.22>>1.25 today. Losartan held. Follow closely post cath.   For questions or updates, please contact [place] Please consult [email or web address] for contact info under        Signed, Leanor Kail, PA  05/15/2018, 7:48 AM    ATTENDING ATTESTATION  I have seen, examined and evaluated the patient this pm along with Mr. Geannie Risen.  After reviewing all the available data and chart, we discussed the patients laboratory, study & physical findings as well as symptoms in detail. I agree with his findings, examination as well as impression recommendations as per our discussion.     Does not seem to be having any major issues with dyspnea or chest pain.  No PND orthopnea.  1 thing she notes is that she has been having some fatigue with lack of energy over the last several months. Plan for the day is cardiac  catheterization as her INR is now below 1.8.  This will be to exclude any ischemic etiology for cardiomyopathy.  She is already on carvedilol & hydralazine -pending cath results may add isosorbide dinitrate along with hydralazine (could potentially convert to ARB or ACE inhibitor as an outpatient). No heart rate room to increase beta-blocker based on heart rates in the 49-65 range.  Also with concerns of fatigue, would not want to titrate that further.  If renal function is stable on discharge, can consider low-dose spironolactone.  Will need outpatient echo follow-up in roughly 3 months noted determine plus or minus need for ICD evaluation.  Glenetta Hew, M.D., M.S. Interventional Cardiologist   Pager # [phone] Phone # [phone] [address] [place], [place] [address]

## 2018-05-15 NOTE — Progress Notes (Addendum)
ANTICOAGULATION CONSULT NOTE - Follow up [facility] for Coumadin Indication: atrial fibrillation/NSTEMI  Allergies  Allergen Reactions  . Lisinopril     ? Angioedema (NOT ARB candidate) Because of a history of documented adverse serious drug reaction;Medi Alert bracelet  is recommended  . Hctz [Hydrochlorothiazide]     hyponatremia  . Spironolactone     REACTION: low potassium ???  . Penicillins Other (See Comments)    Pt states medication doesn't work for her d/t her usage of it several years ago.  . Amlodipine Besylate     REACTION: edema    Patient Measurements: Height: _0  (170.2 cm) Weight: 206 lb (93.4 kg) IBW/kg (Calculated) : 61.6  HEPARIN DW (KG): 82.6  Vital Signs: Temp: 99.2 F (37.3 C) (12/27 1323) Temp Source: Oral (12/27 1323) BP: 167/80 (12/27 1638) Pulse Rate: 67 (12/27 1638)  Labs: Recent Labs    05/13/18 0226 05/13/18 0619 05/13/18 1234 05/14/18 0334 05/15/18 0512  HGB  --  9.4*  --  8.7* 9.2*  HCT  --  29.8*  --  27.7* 30.3*  PLT  --  274  --  256 275  LABPROT  --  26.1*  --  24.2* 18.6*  INR  --  2.43  --  2.21 1.57  CREATININE  --  0.96  --  1.22* 1.25*  TROPONINI 1.00* 1.46* 1.15*  --   --     Estimated Creatinine Clearance: 43.5 mL/min (A) (by C-G formula based on SCr of 1.25 mg/dL (H)).   Medical History: Past Medical History:  Diagnosis Date  . Anemia 12/18/2011  . Atrial fibrillation ([place]) 04/13/2011  . Cancer [facility])    breast CA s/p Right mastectomy  . Diabetes mellitus, type 2 (HCC)    diet controlled  . Hx of echocardiogram    Echocardiogram (12/15): Moderate LVH, EF 55-60%, normal wall motion, grade 1 diastolic dysfunction, aortic sclerosis without stenosis, mild AI, MAC  . Hyperlipidemia   . Hypertension    medicine refractory  . Hypothyroidism    s/p thryroid gland ablation  . Neuromuscular disorder ([place])   . NSTEMI (non-ST elevated myocardial infarction) ([place]) 05/13/2018  . Osteopenia   . Renal  insufficiency, mild 09/23/2013    Assessment: 77 yo female on warfarin for atrial fibrillation prior to admission, presenting with fall and head injury. Coumadin PTA dose 2.5 mg on Friday, 5 mg other days, and last dose 12/23. Pharmacy has been consulted for heparin dosing for NSTEMI (to begin when INR < 2.0) -INR= 1.57  Pharmacy asked to resume Coumadin s/p cath tonight.  No heparin bridge needed per Dr. Ellyn Hack.  Goal of Therapy:  INR 2-3 Monitor platelets by anticoagulation protocol: Yes   Plan:  -Coumadin 7.5 mg x 1 tonight. -Daily INR.  Marguerite Olea, [facility] Clinical Pharmacist Phone [phone]  05/15/2018 5:24 PM   Addendum: Pt going to IR for code stroke, will cancel coumadin orders for now.  Marguerite Olea, [facility] Clinical Pharmacist Phone [phone]  05/15/2018 6:59 PM

## 2018-05-15 NOTE — Sedation Documentation (Signed)
Pt trashing in the bed, unable to follow commands. Awaiting consent before moving pt to procedure table due to high risk of falling off table. 5 staff members at bedside to assure pts safety.

## 2018-05-15 NOTE — H&P (View-Only) (Signed)
Progress Note  Patient Name: Gloria Joseph Date of Encounter: 05/15/2018  Primary Cardiologist: Thompson Grayer, MD   Subjective   Feeling well. No chest pain, sob or palpitations.   Inpatient Medications    Scheduled Meds: . atorvastatin  80 mg Oral q1800  . carvedilol  3.125 mg Oral BID  . cholecalciferol  1,000 Units Oral Daily  . citalopram  20 mg Oral Daily  . hydrALAZINE  150 mg Oral BID  . insulin aspart  0-5 Units Subcutaneous QHS  . insulin aspart  0-9 Units Subcutaneous TID WC  . levothyroxine  112 mcg Oral Q0600  . multivitamin with minerals  1 tablet Oral Daily  . omega-3 acid ethyl esters  1 g Oral Daily  . vitamin B-12  1,000 mcg Oral Daily   Continuous Infusions: . heparin     PRN Meds: acetaminophen **OR** acetaminophen, hydrALAZINE, meclizine, nitroGLYCERIN, ondansetron **OR** ondansetron (ZOFRAN) IV   Vital Signs    Vitals:   05/14/18 1143 05/14/18 2202 05/15/18 0300 05/15/18 0500  BP: (!) 125/48 (!) 137/53  (!) 129/57  Pulse: 60 (!) 49  (!) 52  Resp: 16 16  17   Temp: 99.1 F (37.3 C) 98.2 F (36.8 C)  97.9 F (36.6 C)  TempSrc: Oral Oral  Oral  SpO2: 97% 98%  100%  Weight:   93.4 kg   Height:        Intake/Output Summary (Last 24 hours) at 05/15/2018 0748 Last data filed at 05/15/2018 0500 Gross per 24 hour  Intake 720 ml  Output 2000 ml  Net -1280 ml   Filed Weights   05/12/18 1847 05/13/18 0252 05/15/18 0300  Weight: 89.5 kg 95.6 kg 93.4 kg    Telemetry    SR at rate of 50-70s - Personally Reviewed  ECG    None today   Physical Exam   GEN: No acute distress.   Neck: No JVD Cardiac: RRR, no murmurs, rubs, or gallops.  Respiratory: Clear to auscultation bilaterally. GI: Soft, nontender, non-distended  MS: No edema; No deformity. Neuro:  Nonfocal  Psych: Normal affect   Labs    Chemistry Recent Labs  Lab 05/13/18 0619 05/14/18 0334 05/15/18 0512  NA 136 135 139  K 3.5 4.1 4.1  CL 100 102 104  CO2 24 25  22   GLUCOSE 108* 123* 132*  BUN 12 16 19   CREATININE 0.96 1.22* 1.25*  CALCIUM 8.8* 8.9 9.3  GFRNONAA 57* 42* 41*  GFRAA >60 49* 48*  ANIONGAP 12 8 13      Hematology Recent Labs  Lab 05/13/18 0619 05/14/18 0334 05/15/18 0512  WBC 5.6 5.0 5.2  RBC 3.36* 3.08* 3.34*  HGB 9.4* 8.7* 9.2*  HCT 29.8* 27.7* 30.3*  MCV 88.7 89.9 90.7  MCH 28.0 28.2 27.5  MCHC 31.5 31.4 30.4  RDW 14.6 15.0 14.9  PLT 274 256 275    Cardiac Enzymes Recent Labs  Lab 05/13/18 0226 05/13/18 0619 05/13/18 1234  TROPONINI 1.00* 1.46* 1.15*    Radiology    Ct Head Wo Contrast  Result Date: 05/13/2018 CLINICAL DATA:  Tripped and fell hitting head on cement. EXAM: CT HEAD WITHOUT CONTRAST TECHNIQUE: Contiguous axial images were obtained from the base of the skull through the vertex without intravenous contrast. COMPARISON:  05/12/2018 FINDINGS: Brain: There is no evidence for acute hemorrhage, hydrocephalus, mass lesion, or abnormal extra-axial fluid collection. No definite CT evidence for acute infarction. Diffuse loss of parenchymal volume is consistent with atrophy. Patchy low  attenuation in the deep hemispheric and periventricular white matter is nonspecific, but likely reflects chronic microvascular ischemic demyelination. Vascular: No hyperdense vessel or unexpected calcification. Skull: No evidence for fracture. No worrisome lytic or sclerotic lesion. Sinuses/Orbits: The visualized paranasal sinuses and mastoid air cells are clear. Visualized portions of the globes and intraorbital fat are unremarkable. Other: None. IMPRESSION: Stable. Atrophy with chronic small vessel white matter ischemic disease. No acute abnormality. Electronically Signed   By: Misty Stanley M.D.   On: 05/13/2018 13:11    Cardiac Studies   Echo 05/13/18 Study Conclusions  - Left ventricle: The cavity size was mildly dilated. Wall thickness was increased in a pattern of mild LVH. Systolic function was severely reduced.  The estimated ejection fraction was in the range of 20% to 25%. There is akinesis of the apicalanteroseptal, anterior, and inferior myocardium. Doppler parameters are consistent with abnormal left ventricular relaxation (grade 1 diastolic dysfunction). Doppler parameters are consistent with high ventricular filling pressure. - Aortic valve: There was mild regurgitation. - Left atrium: The atrium was mildly dilated. - Right ventricle: The cavity size was mildly dilated. Systolic function was mildly to moderately reduced. - Pulmonary arteries: PA peak pressure: 45 mm Hg (S). - Pericardium, extracardiac: A trivial pericardial effusion was identified posterior to the heart.  Patient Profile     Gloria Joseph a 77 y.o.femalewith a hx of hypertension, hyperlipidemia, diabetes mellitus, hypothyroidism, depression, Paroxysmal atrial fibrillation on Coumadin, bradycardia,  chronic anemia, breast cancer (right mastectomy)and memory issue who presented after mechanical fall and  seen for the evaluation of NSTEMIat the request of Dr. Blaine Hamper.  Admitted 12/2017 at [facility] for hematuria. Incidentally found to have renal mall. Seen by urology and underwent outpatient cystoscopy and MRI without significant founding.   Assessment & Plan    1. Elevated Troponin- ? True NSTEMI vs. Demand Ischemia  - Troponin peaked at 1.46. Demand ischemia vs ACS. She lives with husband and sob. Does house hold chores without any issue. No fatigue, CP or dyspnea. EKG this admission showed new TWI in lateral leads. Echo showed newly reduced LV function 20-25%, kinesis of the apicalanteroseptal, anterior, and inferior myocardium. Grade 1 DD and trivial posterior pericardial effusion. Cardiac risk factors includes HTN, DM and HLD.   Reviewing echo has an appearance that is somewhat concerning for Takotsubo type cardiomyopathy.  However, with elevated troponins and deep T wave inversions and  abnormal echocardiogram, will we do need to evaluate for ischemia.  Will plan coronary angiography today as INR down to 1.57. Heparin started by pharmacy team. The patient understands that risks include but are not limited to stroke (1 in 1000), death (1 in 1000), kidney failure [usually temporary] (1 in 500), bleeding (1 in 200), allergic reaction [possibly serious] (1 in 200), and agrees to proceed.   2. Paroxysmal atrial fibrillation - Sinus rhythm here. Hx of bradycardia in past. HR of 50-70s on Coreg 3.125mg  BID. Coumadin on hold currently>> on heparin.   3. Chronic combined CHF/new cardiomyopathy -with appearance of possible Takotsubo - Echo with newly reduced LVEF. Euvolemic. Continue BB and hydralazine. Held ARB due to rising creatinine.   4. Mechanical fall - Repeat CT negative for bleeding. Per primary team.   5. HTN - BP stable today on current medications.   6. DM - Per primary team   7. HLD - 05/13/2018: Cholesterol 116; HDL 46; LDL Cholesterol 49; Triglycerides 103; VLDL 21  - Continue statin   8. Chronic anemia -  Hgb stable  9. AKI - Scr 0.94>>0.96>>1.22>>1.25 today. Losartan held. Follow closely post cath.   For questions or updates, please contact [place] Please consult [email or web address] for contact info under        Signed, Leanor Kail, PA  05/15/2018, 7:48 AM    ATTENDING ATTESTATION  I have seen, examined and evaluated the patient this pm along with Mr. Geannie Risen.  After reviewing all the available data and chart, we discussed the patients laboratory, study & physical findings as well as symptoms in detail. I agree with his findings, examination as well as impression recommendations as per our discussion.     Does not seem to be having any major issues with dyspnea or chest pain.  No PND orthopnea.  1 thing she notes is that she has been having some fatigue with lack of energy over the last several months. Plan for the day is cardiac  catheterization as her INR is now below 1.8.  This will be to exclude any ischemic etiology for cardiomyopathy.  She is already on carvedilol & hydralazine -pending cath results may add isosorbide dinitrate along with hydralazine (could potentially convert to ARB or ACE inhibitor as an outpatient). No heart rate room to increase beta-blocker based on heart rates in the 49-65 range.  Also with concerns of fatigue, would not want to titrate that further.  If renal function is stable on discharge, can consider low-dose spironolactone.  Will need outpatient echo follow-up in roughly 3 months noted determine plus or minus need for ICD evaluation.  Glenetta Hew, M.D., M.S. Interventional Cardiologist   Pager # [phone] Phone # [phone] [address]. [place], [place] [address]

## 2018-05-15 NOTE — Consult Note (Signed)
NAME:  Gloria Joseph, MRN:  [id number], DOB:  09-Apr-1941, LOS: 2 ADMISSION DATE:  05/12/2018, CONSULTATION DATE:  05/15/18 REFERRING MD: CHIEF COMPLAINT:  Ventilation management   Brief History   Admitted for fall, code stroke 05/15/18 post cardiac cath inbubated during code stroke  History of present illness   77 year old female admitted on 05/12/18 for fall at home.  Ct head 05/12/18 negative.  Mr brain 05/12/18 negative.  Echo 05/13/18 ef 20-25%.  Cardiology consulted for NSTEMI, + trop.  Planned for cardiac cath.    Cardiac cath 05/15/18 normal coronaries.  Post cath developed aphasia.  Code stroke called.    Patient intubated for airway protection.  Ct head 05/15/18 normal  cta angio head 05/15/18 norml.  IR angiogram 4 vessels patent.    Critical care called for ventilator management.   Past Medical History  afib htn Dm hld Hypothyroid anemia  Significant Hospital Events   Ct head 05/12/18 negative Mri head 05/12/18 negative  Echo 05/13/18 ef 20-25% Cardiac cath 05/15/18 normal Stroke code 05/15/18  Ct head 05/15/18 negative  cta head 05/15/18 negative IR  Angiogram 05/15/18 negative  Consults:  Cardiology Neurology   Procedures:  Cardiac cath 05/15/18 normal IR angiogram 05/15/18 normal  Significant Diagnostic Tests:    Micro Data:    Antimicrobials:     Interim history/subjective:  Transferred to 4N post IR .  Goal bp per neurology 120-140  Objective   Blood pressure (!) 150/84, pulse 70, temperature (!) 96.7 F (35.9 C), temperature source Axillary, resp. rate 19, height 5\' 7"  (1.702 m), weight 93.4 kg, SpO2 100 %.    Vent Mode: PRVC FiO2 (%):  [50 %] 50 % Set Rate:  [16 bmp] 16 bmp Vt Set:  [500 mL] 500 mL PEEP:  [5 cmH20] [place] Pressure:  [20 cmH20] 20 cmH20   Intake/Output Summary (Last 24 hours) at 05/15/2018 2131 Last data filed at 05/15/2018 2036 Gross per 24 hour  Intake 880.81 ml  Output 510 ml  Net 370.81 ml    Filed Weights   05/12/18 1847 05/13/18 0252 05/15/18 0300  Weight: 89.5 kg 95.6 kg 93.4 kg    Examination: General: intubated and slightly agitated on fentanyl gtt  HENT: pupils equal round reactive ligt  Lungs: cta b/l Cardiovascular: irregular rhythm, regular rate Abdomen: soft nt, nd bs + Extremities: no le edema Neuro: moving all extremities  GU: no foley  Resolved Hospital Problem list     Assessment & Plan:  77 year old female history of htn, dm, afib admitted for fall, nstemi called for code stroke 05/15/18 post cardiac cath.    Neuro:  Agitated- starting versed gtt, increased fentanyl gtt R/o stroke   Cv:  afib - rate controlled  htn- clividipine gtt for goal sbp 120-140 per neurology   resp Intubated for airway protection.  On pressure control.   cxr ett in proper position; left lower lobe infiltrate; possible aspiration pneumonitis, hold abx for now  Gi;  Npo for possible extubation tomorrow   Gu;  Bladder scan latter; consider foley   Heme;  Lovenox, scds Anemia stable  Restart coumadin per neurology   Best practice:    Labs   CBC: Recent Labs  Lab 05/12/18 2304 05/13/18 0619 05/14/18 0334 05/15/18 0512  WBC 7.5 5.6 5.0 5.2  NEUTROABS 5.6  --   --   --   HGB 10.2* 9.4* 8.7* 9.2*  HCT 32.7* 29.8* 27.7* 30.3*  MCV 91.1 88.7  89.9 90.7  PLT 277 274 256 478    Basic Metabolic Panel: Recent Labs  Lab 05/12/18 2304 05/13/18 0619 05/14/18 0334 05/15/18 0512  NA 134* 136 135 139  K 3.8 3.5 4.1 4.1  CL 102 100 102 104  CO2 22 24 25 22   GLUCOSE 142* 108* 123* 132*  BUN 13 12 16 19   CREATININE 0.94 0.96 1.22* 1.25*  CALCIUM 9.2 8.8* 8.9 9.3   GFR: Estimated Creatinine Clearance: 43.5 mL/min (A) (by C-G formula based on SCr of 1.25 mg/dL (H)). Recent Labs  Lab 05/12/18 2304 05/13/18 0619 05/14/18 0334 05/15/18 0512  WBC 7.5 5.6 5.0 5.2    Liver Function Tests: No results for input(s): AST, ALT, ALKPHOS, BILITOT, PROT,  ALBUMIN in the last 168 hours. No results for input(s): LIPASE, AMYLASE in the last 168 hours. No results for input(s): AMMONIA in the last 168 hours.  ABG No results found for: PHART, PCO2ART, PO2ART, HCO3, TCO2, ACIDBASEDEF, O2SAT   Coagulation Profile: Recent Labs  Lab 05/12/18 1903 05/13/18 0619 05/14/18 0334 05/15/18 0512 05/15/18 1736  INR 2.69 2.43 2.21 1.57 1.51    Cardiac Enzymes: Recent Labs  Lab 05/13/18 0226 05/13/18 0619 05/13/18 1234  TROPONINI 1.00* 1.46* 1.15*    HbA1C: Hgb A1c MFr Bld  Date/Time Value Ref Range Status  05/13/2018 06:19 AM 6.0 (H) 4.8 - 5.6 % Final    Comment:    (NOTE)         Prediabetes: 5.7 - 6.4         Diabetes: >6.4         Glycemic control for adults with diabetes: <7.0   12/08/2013 03:19 PM 6.6 (H) 4.6 - 6.5 % Final    Comment:    Glycemic Control Guidelines for People with Diabetes:Non Diabetic:  <6%Goal of Therapy: <7%Additional Action Suggested:  >8%     CBG: Recent Labs  Lab 05/14/18 1713 05/14/18 2205 05/15/18 0740 05/15/18 1110 05/15/18 1704  GLUCAP 100* 106* 114* 99 89    Review of Systems:     Past Medical History  She,  has a past medical history of Anemia (12/18/2011), Atrial fibrillation ([place]) (04/13/2011), Cancer ([place]), Diabetes mellitus, type 2 ([place]), echocardiogram, Hyperlipidemia, Hypertension, Hypothyroidism, Neuromuscular disorder ([place]), NSTEMI (non-ST elevated myocardial infarction) ([place]) (05/13/2018), Osteopenia, Renal insufficiency, mild (09/23/2013), and Stroke ([place]) (05/15/2018).   Surgical History    Past Surgical History:  Procedure Laterality Date  . BREAST ENHANCEMENT SURGERY    . BREAST IMPLANT REMOVAL  2000   Unilaterally   . BREAST SURGERY    . CHOLECYSTECTOMY    . COLONOSCOPY  2002   Neg  . MASTECTOMY    . RAI  04/04/2006  . TUBAL LIGATION       Social History   reports that she has never smoked. She has never used smokeless tobacco. She reports that she does not drink  alcohol or use drugs.   Family History   Her family history includes Alcohol abuse in her father; Breast cancer in an other family member; Cancer in her maternal grandfather and other family members; Depression in her mother; Diabetes in an other family member; Heart attack (age of onset: 65) in her brother.   Allergies Allergies  Allergen Reactions  . Lisinopril     ? Angioedema (NOT ARB candidate) Because of a history of documented adverse serious drug reaction;Medi Alert bracelet  is recommended  . Hctz [Hydrochlorothiazide]     hyponatremia  . Spironolactone  REACTION: low potassium ???  . Penicillins Other (See Comments)    Pt states medication doesn't work for her d/t her usage of it several years ago.  . Amlodipine Besylate     REACTION: edema     Home Medications  Prior to Admission medications   Medication Sig Start Date End Date Taking? Authorizing Provider  carvedilol (COREG) 3.125 MG tablet Take 1 tablet by mouth 2 (two) times daily. 02/17/18  Yes [provider]  Cholecalciferol (VITAMIN D-3) 1000 UNITS CAPS Take 1 capsule by mouth daily.   Yes [provider]  citalopram (CELEXA) 20 MG tablet Take 20 mg by mouth daily.   Yes [provider]  Coenzyme Q10 (CO Q 10 PO) Take 1 capsule by mouth daily.   Yes [provider]  Flaxseed, Linseed, (FLAXSEED OIL PO) Take 1 tablet by mouth daily.   Yes [provider]  hydrALAZINE (APRESOLINE) 100 MG tablet Take 150 mg by mouth 2 (two) times daily.   Yes [provider]  levothyroxine (SYNTHROID, LEVOTHROID) 112 MCG tablet Take 1 tablet (112 mcg) by mouth daily. EXCEPT Tuesdays and Thursdays take 1/2 tablet (56 mcg) by mouth daily. 04/24/16  Yes [provider]  losartan (COZAAR) 100 MG tablet Take 100 mg by mouth daily.   Yes [provider]  metFORMIN (GLUCOPHAGE) 1000 MG tablet Take 500 mg by mouth 2 (two) times daily. 02/04/16  Yes [provider]   Multiple Vitamins-Minerals (MULTIVITAMINS) CHEW Chew 1 tablet by mouth daily.   Yes [provider]  Omega-3 Fatty Acids (FISH OIL PO) Take 1 capsule by mouth daily.   Yes [provider]  pravastatin (PRAVACHOL) 80 MG tablet Take 80 mg by mouth daily.  03/22/14  Yes [provider]  vitamin B-12 (CYANOCOBALAMIN) 1000 MCG tablet Take 1,000 mcg by mouth daily.   Yes [provider]  warfarin (COUMADIN) 5 MG tablet TAKE 1 TO 1 AND 1/2 TABLET  BY MOUTH DAILY OR AS  DIRECTED Patient taking differently: Take 2.5-5 mg by mouth daily. Take 2.5 mg (5 mg x 0.5) by mouth every Fri; and take 5 mg (5 mg x 1) all other days 02/17/18  Yes Allred, Jeneen Rinks, MD     Critical care time: 30 minutes

## 2018-05-15 NOTE — Progress Notes (Signed)
Code stroke activated 0172 by [employer]. Pt returned from cath lab approx 1715, alert and oriented x4. RN noted pt to be aphasic at 1723, pt transported for stat head CT. Pt became extremely restless and combative while in CT, significant delay in scanning pt. A total 3 mg Ativan IVP given, pt remained restless and agitated required assistance from RRT during the scan to prevent pt from moving.NIH 11

## 2018-05-15 NOTE — Progress Notes (Signed)
At 5:25pm nurse noted patient to be aphasic and code stroke called. Neurology attended to bedside and taken emergently to CT for imaging. Dr. Ellyn Hack also attended code stroke. CTA showed suspicion for prox L M2 occlusion - patient taken to IR. Dayna Dunn PA-C

## 2018-05-15 NOTE — Progress Notes (Signed)
Called pt's son [name] to notify him of mother's change in condition. Stated he will not be coming at this time and please call with updates. Carroll Kinds RN

## 2018-05-15 NOTE — Consult Note (Addendum)
NEURO HOSPITALIST  CONSULT   Requesting Physician: Dr. Wynelle Cleveland  Chief Complaint: aphasia  History obtained from:  Chart and RN  HPI:                                                                                                                                         Gloria Joseph is an 77 y.o. female  With [place]  A. Fib (coumadin),, HTN, HLD, breast cancer, NSTEMI 05/13/2018. Code stroke was initiated post cath for aphasia.    no prior stroke history. Patient has been off coumadin since 05/13/18. Patient originally admitted for NSTEMI on 12/25. Today patient had a cardiac cath and returned completely normal. RN noticed a sudden onset of speech difficulty and slurred speech. Code stroke was initiated.   Hospital course:  12/27:  Cardiac cath, code stroke initiated CTH: no hemorrhage  3 mg ativan given for combative and agitation.   PTT: 136, INR 18.0 CTA: proximal M2 occlusion  Date last known well: Date: 05/15/2018 Time last known well: Time: 17:15 tPA Given: No: d/t heparin given Modified Rankin: Rankin Score=1 NIHSS:11   Past Medical History:  Diagnosis Date  . Anemia 12/18/2011  . Atrial fibrillation ([place]) 04/13/2011  . Cancer [facility])    breast CA s/p Right mastectomy  . Diabetes mellitus, type 2 (HCC)    diet controlled  . Hx of echocardiogram    Echocardiogram (12/15): Moderate LVH, EF 55-60%, normal wall motion, grade 1 diastolic dysfunction, aortic sclerosis without stenosis, mild AI, MAC  . Hyperlipidemia   . Hypertension    medicine refractory  . Hypothyroidism    s/p thryroid gland ablation  . Neuromuscular disorder ([place])   . NSTEMI (non-ST elevated myocardial infarction) ([place]) 05/13/2018  . Osteopenia   . Renal insufficiency, mild 09/23/2013    Past Surgical History:  Procedure Laterality Date  . BREAST ENHANCEMENT SURGERY    . BREAST IMPLANT REMOVAL  2000   Unilaterally   . BREAST SURGERY    .  CHOLECYSTECTOMY    . COLONOSCOPY  2002   Neg  . MASTECTOMY    . RAI  04/04/2006  . TUBAL LIGATION      Family History  Problem Relation Age of Onset  . Depression Mother   . Alcohol abuse Father   . Heart attack Brother 36  . Cancer Other        Uncle, not specific  . Cancer Other        Aunt, not specific. GYN CA  . Breast cancer Other        Aunt, not specific  . Diabetes Other  Grandmother, not specific  . Cancer Maternal Grandfather        colon         Social History:  reports that she has never smoked. She has never used smokeless tobacco. She reports that she does not drink alcohol or use drugs.  Allergies:  Allergies  Allergen Reactions  . Lisinopril     ? Angioedema (NOT ARB candidate) Because of a history of documented adverse serious drug reaction;Medi Alert bracelet  is recommended  . Hctz [Hydrochlorothiazide]     hyponatremia  . Spironolactone     REACTION: low potassium ???  . Penicillins Other (See Comments)    Pt states medication doesn't work for her d/t her usage of it several years ago.  . Amlodipine Besylate     REACTION: edema    Medications:                                                                                                                           Scheduled: . iopamidol      . aspirin EC  81 mg Oral Daily  . atorvastatin  80 mg Oral q1800  . carvedilol  3.125 mg Oral BID  . cholecalciferol  1,000 Units Oral Daily  . citalopram  20 mg Oral Daily  . hydrALAZINE  150 mg Oral BID  . insulin aspart  0-5 Units Subcutaneous QHS  . insulin aspart  0-9 Units Subcutaneous TID WC  . levothyroxine  112 mcg Oral Q0600  . multivitamin with minerals  1 tablet Oral Daily  . omega-3 acid ethyl esters  1 g Oral Daily  . sodium chloride flush  3 mL Intravenous Q12H  . vitamin B-12  1,000 mcg Oral Daily  . warfarin  7.5 mg Oral ONCE-1800  . Warfarin - Pharmacist Dosing Inpatient   Does not apply q1800   Continuous: . sodium  chloride    . sodium chloride     ZOX:WRUEAV chloride, acetaminophen **OR** acetaminophen, hydrALAZINE, meclizine, nitroGLYCERIN, ondansetron **OR** ondansetron (ZOFRAN) IV, sodium chloride flush   ROS:                                                                                                                                        unobtainable from patient due to aphasia      General Examination:  Blood pressure (!) 167/80, pulse 67, temperature 99.2 F (37.3 C), temperature source Oral, resp. rate (!) 6, height _0  (1.702 m), weight 93.4 kg, SpO2 98 %.  HEENT-  Normocephalic, no lesions, without obvious abnormality.  Normal external eye and conjunctiva. Cardiovascular- S1-S2 audible, pulses palpable throughout  Lungs-no rhonchi or wheezing noted, no excessive working breathing.  Saturations within normal limits on 2 L [place] Abdomen- All 4 quadrants palpated and non tender Extremities- Warm, dry and intact Musculoskeletal-no joint tenderness, deformity or swelling Skin-warm and dry, no hyperpigmentation, vitiligo, or suspicious lesions  Neurological Examination Mental Status: Initially alert, not oriented. Aphasic. Does not follow commands Cranial Nerves: Unreliably blink to threat from left, PERRL EOMI, no gaze deviation smile asymmetric,facial droop  noted hearing normal bilaterally  uvula rises midline  bilateral shoulder shrug  midline tongue extension initially Motor: Moves all 4 extremities spontaneously with good strength 5/5 Sensory: withdraws from painful stimuli in all 4 extremities Cerebellar: UTA Gait: deferred   Lab Results: Basic Metabolic Panel: Recent Labs  Lab 05/12/18 2304 05/13/18 0619 05/14/18 0334 05/15/18 0512  NA 134* 136 135 139  K 3.8 3.5 4.1 4.1  CL 102 100 102 104  CO2 _1 GLUCOSE 142* 108* 123* 132*  BUN _2 CREATININE 0.94 0.96 1.22* 1.25*  CALCIUM 9.2 8.8* 8.9 9.3    CBC: Recent Labs  Lab 05/12/18 2304 05/13/18 0619 05/14/18 0334 05/15/18 0512  WBC 7.5 5.6 5.0 5.2  NEUTROABS 5.6  --   --   --   HGB 10.2* 9.4* 8.7* 9.2*  HCT 32.7* 29.8* 27.7* 30.3*  MCV 91.1 88.7 89.9 90.7  PLT 277 274 256 275    Lipid Panel: Recent Labs  Lab 05/13/18 0619  CHOL 116  TRIG 103  HDL 46  CHOLHDL 2.5  VLDL 21  LDLCALC 49    CBG: Recent Labs  Lab 05/14/18 1713 05/14/18 2205 05/15/18 0740 05/15/18 1110 05/15/18 1704  GLUCAP 100* 106* 114* 99 89    Imaging: Ct Angio Head W Or Wo Contrast  Result Date: 05/15/2018 CLINICAL DATA:  Expressive aphasia. Clinical concern for left MCA LVO. EXAM: CT ANGIOGRAPHY HEAD AND NECK TECHNIQUE: Multidetector CT imaging of the head and neck was performed using the standard protocol during bolus administration of intravenous contrast. Multiplanar CT image reconstructions and MIPs were obtained to evaluate the vascular anatomy. Carotid stenosis measurements (when applicable) are obtained utilizing NASCET criteria, using the distal internal carotid diameter as the denominator. CONTRAST:  54m ISOVUE-370 IOPAMIDOL (ISOVUE-370) INJECTION 76% COMPARISON:  Head and neck CTA 04/11/2011 FINDINGS: CTA NECK FINDINGS Aortic arch: Standard 3 vessel aortic arch, incompletely imaged. Widely patent arch vessel origins. Calcified plaque at the right subclavian artery origin and in the proximal left subclavian artery without significant stenosis. Right carotid system: Calcified plaque about the right carotid bifurcation with severe motion artifact through this region precluding assessment for stenosis. Wide patency of the common carotid artery more proximally. Patent proximal cervical ICA. Obscuration of the more distal cervical ICA due to motion. Left carotid system: Calcified plaque about the right carotid bifurcation with severe motion artifact through this region precluding  assessment for stenosis. Wide patency of the common carotid artery more proximally. Patent proximal and mid cervical ICA. Obscuration of the distal most cervical ICA due to motion. Vertebral arteries: Both vertebral arteries are patent proximally without evidence of significant origin stenosis, however the V2 segments are largely obscured by motion artifact. The  V3 segments are grossly patent. Skeleton: Focally advanced disc degeneration at C5-6 with uncovertebral spurring resulting in asymmetric right neural foraminal stenosis. Other neck: Diminutive thyroid. Upper chest: No apical lung consolidation. Review of the MIP images confirms the above findings CTA HEAD FINDINGS Anterior circulation: The intracranial internal carotid arteries are grossly patent but poorly evaluated due to motion artifact. The A1 and M1 segments are grossly patent bilaterally. Branch vessel evaluation is limited by severe motion artifact. The left M2 inferior division is patent proximally, however the left M2 superior division trunk (a patent and sizable vessel on the 2012 CTA) is not clearly seen which is concerning for occlusion in this clinical scenario. It is worth noting however that motion artifact also results in poor visualization of right MCA branch vessels as well. No large aneurysm is identified. Posterior circulation: The intracranial vertebral arteries are grossly patent to the basilar. The basilar artery is obscured proximally by motion artifact but appears widely patent more distally. The PCAs are patent proximally. No large aneurysm is identified. Venous sinuses: Grossly patent where adequately visualized given arterial phase contrast timing and motion artifact. Anatomic variants: None. Review of the MIP images confirms the above findings IMPRESSION: 1. Severely motion degraded examination. 2. Findings suspicious for proximal left M2 occlusion. 3. No evidence of large vessel occlusion more proximally. 4. Calcified plaque at  the carotid bifurcations in the neck. Nondiagnostic evaluation for stenosis due to motion. The study was reviewed in person with Dr. Leonel Ramsay on 05/15/2018 at 6:35 p.m. Electronically Signed   By: Logan Bores M.D.   On: 05/15/2018 19:00   Ct Angio Neck W Or Wo Contrast  Result Date: 05/15/2018 CLINICAL DATA:  Expressive aphasia. Clinical concern for left MCA LVO. EXAM: CT ANGIOGRAPHY HEAD AND NECK TECHNIQUE: Multidetector CT imaging of the head and neck was performed using the standard protocol during bolus administration of intravenous contrast. Multiplanar CT image reconstructions and MIPs were obtained to evaluate the vascular anatomy. Carotid stenosis measurements (when applicable) are obtained utilizing NASCET criteria, using the distal internal carotid diameter as the denominator. CONTRAST:  2m ISOVUE-370 IOPAMIDOL (ISOVUE-370) INJECTION 76% COMPARISON:  Head and neck CTA 04/11/2011 FINDINGS: CTA NECK FINDINGS Aortic arch: Standard 3 vessel aortic arch, incompletely imaged. Widely patent arch vessel origins. Calcified plaque at the right subclavian artery origin and in the proximal left subclavian artery without significant stenosis. Right carotid system: Calcified plaque about the right carotid bifurcation with severe motion artifact through this region precluding assessment for stenosis. Wide patency of the common carotid artery more proximally. Patent proximal cervical ICA. Obscuration of the more distal cervical ICA due to motion. Left carotid system: Calcified plaque about the right carotid bifurcation with severe motion artifact through this region precluding assessment for stenosis. Wide patency of the common carotid artery more proximally. Patent proximal and mid cervical ICA. Obscuration of the distal most cervical ICA due to motion. Vertebral arteries: Both vertebral arteries are patent proximally without evidence of significant origin stenosis, however the V2 segments are largely obscured  by motion artifact. The V3 segments are grossly patent. Skeleton: Focally advanced disc degeneration at C5-6 with uncovertebral spurring resulting in asymmetric right neural foraminal stenosis. Other neck: Diminutive thyroid. Upper chest: No apical lung consolidation. Review of the MIP images confirms the above findings CTA HEAD FINDINGS Anterior circulation: The intracranial internal carotid arteries are grossly patent but poorly evaluated due to motion artifact. The A1 and M1 segments are grossly patent bilaterally. Branch vessel evaluation is limited by  severe motion artifact. The left M2 inferior division is patent proximally, however the left M2 superior division trunk (a patent and sizable vessel on the 2012 CTA) is not clearly seen which is concerning for occlusion in this clinical scenario. It is worth noting however that motion artifact also results in poor visualization of right MCA branch vessels as well. No large aneurysm is identified. Posterior circulation: The intracranial vertebral arteries are grossly patent to the basilar. The basilar artery is obscured proximally by motion artifact but appears widely patent more distally. The PCAs are patent proximally. No large aneurysm is identified. Venous sinuses: Grossly patent where adequately visualized given arterial phase contrast timing and motion artifact. Anatomic variants: None. Review of the MIP images confirms the above findings IMPRESSION: 1. Severely motion degraded examination. 2. Findings suspicious for proximal left M2 occlusion. 3. No evidence of large vessel occlusion more proximally. 4. Calcified plaque at the carotid bifurcations in the neck. Nondiagnostic evaluation for stenosis due to motion. The study was reviewed in person with Dr. Leonel Ramsay on 05/15/2018 at 6:35 p.m. Electronically Signed   By: Logan Bores M.D.   On: 05/15/2018 19:00   Ct Head Code Stroke Wo Contrast  Result Date: 05/15/2018 CLINICAL DATA:  Code stroke.   Expressive aphasia. EXAM: CT HEAD WITHOUT CONTRAST TECHNIQUE: Contiguous axial images were obtained from the base of the skull through the vertex without intravenous contrast. COMPARISON:  05/13/2018 brain MRI and CT FINDINGS: Brain: There is no evidence of acute infarct, intracranial hemorrhage, mass, midline shift, or extra-axial fluid collection. The ventricles and sulci are normal. Patchy to confluent cerebral white matter hypodensities are unchanged and nonspecific but compatible with severe chronic small vessel ischemic disease. Vascular: Calcified atherosclerosis at the skull base. Residual vascular contrast from coronary catheterization earlier today Skull: No fracture or focal osseous lesion. Sinuses/Orbits: Paranasal sinuses and mastoid air cells are clear. Bilateral cataract extraction. Other: None. ASPECTS [facility] Stroke Program Early CT Score) - Ganglionic level infarction (caudate, lentiform nuclei, internal capsule, insula, M1-M3 cortex): 7 - Supraganglionic infarction (M4-M6 cortex): 3 Total score (0-10 with 10 being normal): 10 IMPRESSION: 1. No evidence of acute intracranial abnormality. 2. ASPECTS is 10. 3. Severe chronic small vessel ischemic disease. These results were communicated to Dr. Leonel Ramsay at 6:02 pm on 05/15/2018 by text page via the [employer] messaging system. Electronically Signed   By: Logan Bores M.D.   On: 05/15/2018 18:02    Laurey Morale, MSN, NP-C [facility] [phone]  05/15/2018, 5:50 PM   Attending physician note to follow with Assessment and plan . I have seen the patient reviewed the above note.  On my exam, initially she had a mild aphasia, this progressed to a fairly dense aphasia with right facial droop and dysarthria over the course of my evaluation.  When she became progressively aphasic, she also became mildly agitated requiring 3 mg of IV Ativan as well as physical restraints to get CT angiogram.  This caused significant delay prior to  getting her to angiography.  The CTA, though poor quality, appeared to show a left M2 occlusion and therefore she was taken for embolectomy.  There is no occlusion seen on angiogram suggesting that this either recanalized or was artifactual in the first place.  She was not an IV TPA candidate because of anticoagulation with heparin, PTT was 136.  She initially returned from catheterization normal, without signs of aphasia and subsequently developed abrupt aphasia while on the floor.  Assessment: 77 y.o. female With [place]  A. Fib (subtherapeutic on Coumadin), HTN, HLD, breast cancer, NSTEMI 05/13/2018. Code stroke was initiated post cath for aphasia. CTH: negative for hemorrhage. TPA not given d/t heparin recent heparin drip.  CTA: proximal left M2 occlusion. IR activated. PTT: 136, INR 18.0  Stroke Risk Factors - atrial fibrillation, hyperlipidemia and hypertension   Plan: --MRI Brain  --CTA (completed) -- ASA, I will hold anticoagulation until the size of the infarct is determined. -- High intensity Statin if LDL > 70 -- HgbA1c, fasting lipid panel -- PT consult, OT consult, Speech consult --Telemetry monitoring --Frequent neuro checks --Stroke swallow screen   CNS -Close neuro monitoring  Dysarthria/ aphasia -NPO until cleared by speech -ST -Advance diet as tolerated   RESP -monitor  CV Essential (primary) hypertension  Chronic atrial -fibrillation -rate control Takosubosu's cardiomyopathy, appreciate cardiology involvement  GI/GU -Gentle hydration  HEME -Monitor -transfuse for hgb < 7  Coagulopathy secondary to anticoagulation -Trend PT/PTT/INR   ENDO -goal HgbA1c < 7  ID Possible Aspiration PNA -Monitor  Prophylaxis DVT: SCD GI: doc/senna  Dispo: TBD Diet: NPO until cleared by speech or bedside swallow eval  Code Status: Full Code     --please page stroke NP  Or  PA  Or MD from 8am -4 pm  as this patient from this time will be  followed by the  stroke.   You can look them up on [email or web address]  Password [email or web address]   This patient is critically ill and at significant risk of neurological worsening, death and care requires constant monitoring of vital signs, hemodynamics,respiratory and cardiac monitoring, neurological assessment, discussion with family, other specialists and medical decision making of high complexity. I spent 90 minutes of neurocritical care time  in the care of  this patient.  Roland Rack, MD [facility] [phone]  If 7pm- 7am, please page neurology on call as listed in [place]. 05/15/2018  8:39 PM

## 2018-05-15 NOTE — Procedures (Signed)
S/P 4 vessel cerebral arteriogram. RT CFA approach. Findings. 1. Angiographically no evidence of occlusions,stenosis,intraluminal filling defects,dissections or of AV shunting. 2.Venous outflow WNLs.

## 2018-05-15 NOTE — Anesthesia Preprocedure Evaluation (Signed)
Anesthesia Evaluation  Patient identified by MRN, date of birth, ID band Patient confused  Preop documentation limited or incomplete due to emergent nature of procedure.  Airway Mallampati: III  TM Distance: >3 FB Neck ROM: Full    Dental  (+) Dental Advisory Given   Pulmonary neg pulmonary ROS,           Cardiovascular hypertension, +CHF   Rhythm:Regular Rate:Normal  Echo 05/13/2018 EF 20-25%. Stress cardiomyopathy  Cath 05/15/2018 No CAD   Neuro/Psych CVA    GI/Hepatic   Endo/Other  diabetes  Renal/GU Renal disease     Musculoskeletal   Abdominal (+) + obese,   Peds  Hematology  (+) anemia ,   Anesthesia Other Findings   Reproductive/Obstetrics                             Anesthesia Physical Anesthesia Plan  ASA: IV and emergent  Anesthesia Plan: General   Post-op Pain Management:    Induction: Intravenous  PONV Risk Score and Plan: 3 and Ondansetron and Treatment may vary due to age or medical condition  Airway Management Planned: Oral ETT  Additional Equipment: Arterial line  Intra-op Plan:   Post-operative Plan: Post-operative intubation/ventilation  Informed Consent: I have reviewed the patients History and Physical, chart, labs and discussed the procedure including the risks, benefits and alternatives for the proposed anesthesia with the patient or authorized representative who has indicated his/her understanding and acceptance.   Dental advisory given  Plan Discussed with: CRNA  Anesthesia Plan Comments:         Anesthesia Quick Evaluation

## 2018-05-15 NOTE — Progress Notes (Signed)
[facility] Progress Note Patient Name: Gloria Joseph DOB: 1941/02/23 MRN: [id number]   Date of Service  05/15/2018  HPI/Events of Note  Agitated Delirium - Unfortunately, patient has history of QTc interval prolongation. Will avoid Propofol and Precedex.   eICU Interventions  Will order: 1. Fentanyl IV infusion. Titrate to RASS = 0.  2. Versed 2 mg IV Q 1 hour PRN severe agitation.     Intervention Category Major Interventions: Delirium, psychosis, severe agitation - evaluation and management  Macoy Rodwell Eugene 05/15/2018, 9:00 PM

## 2018-05-15 NOTE — Transfer of Care (Signed)
Immediate Anesthesia Transfer of Care Note  Patient: Gloria Joseph  Procedure(s) Performed: IR WITH ANESTHESIA (N/A )  Patient Location: ICU  Anesthesia Type:General  Level of Consciousness: Patient remains intubated per anesthesia plan  Airway & Oxygen Therapy: Patient remains intubated per anesthesia plan and Patient placed on Ventilator (see vital sign flow sheet for setting)  Post-op Assessment: Report given to RN and Post -op Vital signs reviewed and stable  Post vital signs: Reviewed and stable  Last Vitals:  Vitals Value Taken Time  BP    Temp    Pulse 73 05/15/2018  8:44 PM  Resp 19 05/15/2018  8:44 PM  SpO2 100 % 05/15/2018  8:44 PM  Vitals shown include unvalidated device data.  Last Pain:  Vitals:   05/15/18 1636  TempSrc:   PainSc: 0-No pain      Patients Stated Pain Goal: 0 (54/49/20 1007)  Complications: No apparent anesthesia complications

## 2018-05-15 NOTE — Progress Notes (Signed)
Arrived to pt's room. Pt found to have right sided droop and expressive/reseptive aphasia. Called Rapid Response and called Code Stroke. Pt continued to worsen with aphasia over next few min. Dr. Katherine Roan and rapid arrived to assess pt. Cont to monitor. Carroll Kinds RN

## 2018-05-15 NOTE — Anesthesia Procedure Notes (Signed)
Procedure Name: Intubation Date/Time: 05/15/2018 7:27 PM Performed by: Babs Bertin, CRNA Pre-anesthesia Checklist: Patient identified, Emergency Drugs available, Suction available and Patient being monitored Patient Re-evaluated:Patient Re-evaluated prior to induction Oxygen Delivery Method: Circle System Utilized Preoxygenation: Pre-oxygenation with 100% oxygen Induction Type: IV induction, Rapid sequence and Cricoid Pressure applied Laryngoscope Size: Mac and 3 Grade View: Grade I Tube type: Subglottic suction tube Tube size: 7.0 mm Number of attempts: 1 Airway Equipment and Method: Stylet and Oral airway Placement Confirmation: ETT inserted through vocal cords under direct vision,  positive ETCO2 and breath sounds checked- equal and bilateral Secured at: 21 cm Tube secured with: Tape Dental Injury: Teeth and Oropharynx as per pre-operative assessment

## 2018-05-15 NOTE — Progress Notes (Signed)
ANTICOAGULATION CONSULT NOTE - Follow up [facility] for heparin Indication: atrial fibrillation/NSTEMI  Allergies  Allergen Reactions  . Lisinopril     ? Angioedema (NOT ARB candidate) Because of a history of documented adverse serious drug reaction;Medi Alert bracelet  is recommended  . Hctz [Hydrochlorothiazide]     hyponatremia  . Spironolactone     REACTION: low potassium ???  . Penicillins Other (See Comments)    Pt states medication doesn't work for her d/t her usage of it several years ago.  . Amlodipine Besylate     REACTION: edema    Patient Measurements: Height: _0  (170.2 cm) Weight: 206 lb (93.4 kg) IBW/kg (Calculated) : 61.6  HEPARIN DW (KG): 82.6  Vital Signs: Temp: 97.9 F (36.6 C) (12/27 0500) Temp Source: Oral (12/27 0500) BP: 129/57 (12/27 0500) Pulse Rate: 52 (12/27 0500)  Labs: Recent Labs    05/12/18 2304 05/13/18 0226 05/13/18 0619 05/13/18 1234 05/14/18 0334 05/15/18 0512  HGB 10.2*  --  9.4*  --  8.7* 9.2*  HCT 32.7*  --  29.8*  --  27.7* 30.3*  PLT 277  --  274  --  256 275  LABPROT  --   --  26.1*  --  24.2* 18.6*  INR  --   --  2.43  --  2.21 1.57  CREATININE 0.94  --  0.96  --  1.22*  --   TROPONINI  --  1.00* 1.46* 1.15*  --   --     Estimated Creatinine Clearance: 44.6 mL/min (A) (by C-G formula based on SCr of 1.22 mg/dL (H)).   Medical History: Past Medical History:  Diagnosis Date  . Anemia 12/18/2011  . Atrial fibrillation ([place]) 04/13/2011  . Cancer [facility])    breast CA s/p Right mastectomy  . Diabetes mellitus, type 2 (HCC)    diet controlled  . Hx of echocardiogram    Echocardiogram (12/15): Moderate LVH, EF 55-60%, normal wall motion, grade 1 diastolic dysfunction, aortic sclerosis without stenosis, mild AI, MAC  . Hyperlipidemia   . Hypertension    medicine refractory  . Hypothyroidism    s/p thryroid gland ablation  . Neuromuscular disorder ([place])   . NSTEMI (non-ST elevated myocardial  infarction) ([place]) 05/13/2018  . Osteopenia   . Renal insufficiency, mild 09/23/2013    Assessment: 77 yo female on warfarin for atrial fibrillation prior to admission, presenting with fall and head injury. Coumadin PTA dose 2.5 mg on Friday, 5 mg other days, and last dose 12/23. Pharmacy has been consulted for heparin dosing for NSTEMI (to begin when INR < 2.0) -INR= 1.57  Goal of Therapy:  INR 2-3 Monitor platelets by anticoagulation protocol: Yes   Plan:  -No heparin bolus -Begin heparin infusion at 1000 units/hr -Heparin level in 8 hours and daily wth CBC daily  Hildred Laser, PharmD Clinical Pharmacist **Pharmacist phone directory can now be found on [email or web address] ([email or web address]).  Listed under [place].

## 2018-05-15 NOTE — Progress Notes (Signed)
Clarified plan for anticoag with MD - resume Coumadin tonight but does not need heparin bridge per Dr. Ellyn Hack. Recs relayed to pharmacy. Dayna Dunn PA-C

## 2018-05-15 NOTE — Progress Notes (Signed)
Clevidipine gtt being ordered to maintain SBP within post-VIR range of 120 - 140. The patient has an allergy listed to amlodipine with edema. This is felt to be relatively low risk relative to the benefits of BP control. Discussed with RN who will monitor closely for possible development of edema.   Electronically signed: Dr. Kerney Elbe

## 2018-05-15 NOTE — Anesthesia Procedure Notes (Signed)
Arterial Line Insertion Start/End12/27/2019 7:35 PM, 05/15/2018 7:40 PM Performed by: Nolon Nations, MD, Rube Sanchez, Kathrin Penner, CRNA, CRNA  Preanesthetic checklist: patient identified, IV checked, monitors and equipment checked and pre-op evaluation Left, radial was placed Catheter size: 20 G Maximum sterile barriers used   Attempts: 1 Procedure performed without using ultrasound guided technique. Ultrasound Notes:anatomy identified Following insertion, dressing applied and Biopatch.

## 2018-05-15 NOTE — Progress Notes (Signed)
Physical Therapy Treatment Patient Details Name: Gloria Joseph MRN: [id number] DOB: 11-26-1940 Today's Date: 05/15/2018    History of Present Illness pt is a 77 yo female s/p fall hitting R side of head, CT head negative and MRI brain negative for any acute events. pt with vertigo and PMHx: includes chronic vertigo, R mastectomy, edema, Afib, DMT2, HLD, HTN, HCC, obesity, edema, depression    PT Comments    Pt admitted with above diagnosis. Pt currently with functional limitations due to the deficits listed below (see PT Problem List). Pt awaiting CATH today.  Discussed pts dizziness and she states that it is improved after Epley done yesterday.  Reviewed the x1 exercises with pt as pt also with gaze stability issue.  Pt was thankful for the treatment and excited that compensation strategies are also making her feel better.  Pt states she feels much steadier on her feet.  Continue PT.  Pt will benefit from skilled PT to increase their independence and safety with mobility to allow discharge to the venue listed below.     Follow Up Recommendations  Outpatient PT;Supervision - Intermittent     Equipment Recommendations  None recommended by PT    Recommendations for Other Services       Precautions / Restrictions Precautions Precautions: None Restrictions Weight Bearing Restrictions: No    Mobility  Bed Mobility Overal bed mobility: Modified Independent                Transfers Overall transfer level: Modified independent Equipment used: Rolling walker (2 wheeled);1 person hand held assist(depending on if any dizziness- supervision advised)                Ambulation/Gait Ambulation/Gait assistance: Min guard Gait Distance (Feet): 50 Feet Assistive device: 1 person hand held assist Gait Pattern/deviations: Step-through pattern;Decreased stride length     General Gait Details: slow guarded gait with +1 HHA for stability to walk to bathroom.  does better with  RW.    Stairs             Wheelchair Mobility    Modified Rankin (Stroke Patients Only)       Balance Overall balance assessment: Mild deficits observed, not formally tested;History of Falls                                          Cognition Arousal/Alertness: Awake/alert Behavior During Therapy: WFL for tasks assessed/performed Overall Cognitive Status: Within Functional Limits for tasks assessed                                        Exercises Other Exercises Other Exercises: Access Code: [email or web address]  sent to pts email yesterday Other Exercises: Reviewed x1 exercise with progression, pt performed x 1 in sitting.     General Comments General comments (skin integrity, edema, etc.): Pt states she feels much better after Epley yesterday.  Worked on x1 exercises with pt.       Pertinent Vitals/Pain Pain Assessment: No/denies pain    Home Living                      Prior Function            PT Goals (current goals can now be found in the care  plan section) Acute Rehab PT Goals Patient Stated Goal: to go home to see my grandsons in town Progress towards PT goals: Progressing toward goals    Frequency    Min 3X/week      PT Plan Current plan remains appropriate    Co-evaluation              AM-PAC PT "6 Clicks" Mobility   Outcome Measure  Help needed turning from your back to your side while in a flat bed without using bedrails?: None Help needed moving from lying on your back to sitting on the side of a flat bed without using bedrails?: None Help needed moving to and from a bed to a chair (including a wheelchair)?: None Help needed standing up from a chair using your arms (e.g., wheelchair or bedside chair)?: None Help needed to walk in hospital room?: A Little Help needed climbing 3-5 steps with a railing? : A Little 6 Click Score: 22    End of Session   Activity Tolerance: Patient limited by  fatigue Patient left: with call bell/phone within reach;in bed;with bed alarm set Nurse Communication: Mobility status PT Visit Diagnosis: Unsteadiness on feet (R26.81);BPPV BPPV - Right/Left : Left     Time: 1436-1450 PT Time Calculation (min) (ACUTE ONLY): 14 min  Charges:  $Therapeutic Exercise: 8-22 mins                     March Steyer,PT Acute Rehabilitation Services Pager:  [phone]  Office:  [phone]     Denice Paradise 05/15/2018, 3:07 PM

## 2018-05-15 NOTE — Progress Notes (Signed)
PROGRESS NOTE    Gloria Joseph   ZCH:[id number]  DOB: 05-22-1940  DOA: 05/12/2018 PCP: Jani Gravel, MD   Brief Narrative:  Gloria Joseph is a 77 y.o. female with medical history significant of hypertension, hyperlipidemia, diabetes mellitus, hypothyroidism, depression, atrial fibrillation on Coumadin, anemia, breast cancer (right mastectomy), who presents with fall. she was on her way to church when she tripped on the sidewalk and fell. She hit her head, causing mild left side headache but subsequently developed Vertigo. She was given numerous medications in the ED to resolve the vertigo (meclizine, Ativan, Phenergan) but remained symptomatic and was admitted.  Overnight, her troponin became elevated to 1.0 and cardiology fellow was consulted.    Subjective:  No other complaints today.     Assessment & Plan:   Principal Problem:   Fall with head injury in setting of Coumadin use and subsequent vertigo - tripped and hit left side of head - no abnormalities on initial CT or MRI (severe motion artifact on MRI) - vertigo much better - appears she may have BPPV as she states she has exercises she does for her vertgo at home which involve turning of her head - repeat head CT is negative for bleeding - obtained PT and OT evals- PT recommends outpt vestibular PT- she has no further OT needed  Active Problems:   NSTEMI (non-ST elevated myocardial infarction)  Troponin: Latest Ref Range: <0.03 ng/mL 1.00 (HH) 1.46 (HH) 1.15 (HH)   - did not have chest pain- has T wave inversion in lat leads- troponin trending down now - 2 d ECHO shows new finding of EF of 20-25% with akinesis, grade 1 d CHF and mild RV dysfunction as well- see ECHO below - cardiology following-will have cath today  HFrEF EF of 20-25%  - ? takatsubo's per cardiology eval - new finding, not fluid overloaded - currently on Coreg, Hydralazine- ARB on hold due to rising Cr    PAF (paroxysmal atrial fibrillation) -  cont Coreg - Coumadin on hold for cath  Rising Cr - 0.9 > 1.22- holding ARB tomorrow-  follow    Essential hypertension - Coreg, Hydralazine, Cozaar    Diabetes mellitus 2  - hold Metformin cont SSI   HLD - Lipitor    Hypothyroidism - Synthroid  Anemia  - chronic- and close to baseline    DVT prophylaxis: INR therapeutic Code Status: Full code Family Communication:  Disposition Plan: await cardiology opinion Consultants:   cardiology Procedures:  Left ventricle: The cavity size was mildly dilated. Wall   thickness was increased in a pattern of mild LVH. Systolic   function was severely reduced. The estimated ejection fraction   was in the range of 20% to 25%. There is akinesis of the   apicalanteroseptal, anterior, and inferior myocardium. Doppler   parameters are consistent with abnormal left ventricular   relaxation (grade 1 diastolic dysfunction). Doppler parameters   are consistent with high ventricular filling pressure. - Aortic valve: There was mild regurgitation. - Left atrium: The atrium was mildly dilated. - Right ventricle: The cavity size was mildly dilated. Systolic   function was mildly to moderately reduced. - Pulmonary arteries: PA peak pressure: 45 mm Hg (S). - Pericardium, extracardiac: A trivial pericardial effusion was   identified posterior to the heart. Antimicrobials:  Anti-infectives (From admission, onward)   None       Objective: Vitals:   05/14/18 2202 05/15/18 0300 05/15/18 0500 05/15/18 1323  BP: (!) 137/53  Marland Kitchen)  129/57 (!) 149/61  Pulse: (!) 49  (!) 52 (!) 52  Resp: 16  17 12   Temp: 98.2 F (36.8 C)  97.9 F (36.6 C) 99.2 F (37.3 C)  TempSrc: Oral  Oral Oral  SpO2: 98%  100% 98%  Weight:  93.4 kg    Height:        Intake/Output Summary (Last 24 hours) at 05/15/2018 1431 Last data filed at 05/15/2018 0500 Gross per 24 hour  Intake 240 ml  Output 1000 ml  Net -760 ml   Filed Weights   05/12/18 1847 05/13/18 0252  05/15/18 0300  Weight: 89.5 kg 95.6 kg 93.4 kg    Examination: General exam: Appears comfortable  HEENT: PERRLA, oral mucosa moist, no sclera icterus or thrush Respiratory system: Clear to auscultation. Respiratory effort normal. Cardiovascular system: S1 & S2 heard,  No murmurs  Gastrointestinal system: Abdomen soft, non-tender, nondistended. Normal bowel sound. No organomegaly Central nervous system: Alert and oriented. No focal neurological deficits. Extremities: No cyanosis, clubbing or edema Skin: No rashes or ulcers Psychiatry:  Mood & affect appropriate.   Data Reviewed: I have personally reviewed following labs and imaging studies  CBC: Recent Labs  Lab 05/12/18 2304 05/13/18 0619 05/14/18 0334 05/15/18 0512  WBC 7.5 5.6 5.0 5.2  NEUTROABS 5.6  --   --   --   HGB 10.2* 9.4* 8.7* 9.2*  HCT 32.7* 29.8* 27.7* 30.3*  MCV 91.1 88.7 89.9 90.7  PLT 277 274 256 176   Basic Metabolic Panel: Recent Labs  Lab 05/12/18 2304 05/13/18 0619 05/14/18 0334 05/15/18 0512  NA 134* 136 135 139  K 3.8 3.5 4.1 4.1  CL 102 100 102 104  CO2 22 24 25 22   GLUCOSE 142* 108* 123* 132*  BUN 13 12 16 19   CREATININE 0.94 0.96 1.22* 1.25*  CALCIUM 9.2 8.8* 8.9 9.3   GFR: Estimated Creatinine Clearance: 43.5 mL/min (A) (by C-G formula based on SCr of 1.25 mg/dL (H)). Liver Function Tests: No results for input(s): AST, ALT, ALKPHOS, BILITOT, PROT, ALBUMIN in the last 168 hours. No results for input(s): LIPASE, AMYLASE in the last 168 hours. No results for input(s): AMMONIA in the last 168 hours. Coagulation Profile: Recent Labs  Lab 05/12/18 1903 05/13/18 0619 05/14/18 0334 05/15/18 0512  INR 2.69 2.43 2.21 1.57   Cardiac Enzymes: Recent Labs  Lab 05/13/18 0226 05/13/18 0619 05/13/18 1234  TROPONINI 1.00* 1.46* 1.15*   BNP (last 3 results) No results for input(s): PROBNP in the last 8760 hours. HbA1C: Recent Labs    05/13/18 0619  HGBA1C 6.0*   CBG: Recent Labs    Lab 05/14/18 1141 05/14/18 1713 05/14/18 2205 05/15/18 0740 05/15/18 1110  GLUCAP 148* 100* 106* 114* 99   Lipid Profile: Recent Labs    05/13/18 0619  CHOL 116  HDL 46  LDLCALC 49  TRIG 103  CHOLHDL 2.5   Thyroid Function Tests: No results for input(s): TSH, T4TOTAL, FREET4, T3FREE, THYROIDAB in the last 72 hours. Anemia Panel: No results for input(s): VITAMINB12, FOLATE, FERRITIN, TIBC, IRON, RETICCTPCT in the last 72 hours. Urine analysis:    Component Value Date/Time   COLORURINE YELLOW 11/28/2012 2001   APPEARANCEUR CLOUDY (A) 11/28/2012 2001   LABSPEC 1.013 11/28/2012 2001   PHURINE 5.5 11/28/2012 2001   GLUCOSEU NEGATIVE 11/28/2012 2001   GLUCOSEU NEGATIVE 05/02/2011 1013   HGBUR NEGATIVE 11/28/2012 2001   BILIRUBINUR NEGATIVE 11/28/2012 2001   KETONESUR NEGATIVE 11/28/2012 2001   PROTEINUR NEGATIVE  11/28/2012 2001   UROBILINOGEN 0.2 11/28/2012 2001   NITRITE NEGATIVE 11/28/2012 2001   LEUKOCYTESUR MODERATE (A) 11/28/2012 2001   Sepsis Labs: @LABRCNTIP (procalcitonin:4,lacticidven:4) )No results found for this or any previous visit (from the past 240 hour(s)).       Radiology Studies: No results found.    Scheduled Meds: . aspirin EC  81 mg Oral Daily  . atorvastatin  80 mg Oral q1800  . carvedilol  3.125 mg Oral BID  . cholecalciferol  1,000 Units Oral Daily  . citalopram  20 mg Oral Daily  . hydrALAZINE  150 mg Oral BID  . insulin aspart  0-5 Units Subcutaneous QHS  . insulin aspart  0-9 Units Subcutaneous TID WC  . levothyroxine  112 mcg Oral Q0600  . multivitamin with minerals  1 tablet Oral Daily  . omega-3 acid ethyl esters  1 g Oral Daily  . sodium chloride flush  3 mL Intravenous Q12H  . vitamin B-12  1,000 mcg Oral Daily   Continuous Infusions: . sodium chloride    . sodium chloride 10 mL/hr at 05/15/18 0901  . heparin 1,000 Units/hr (05/15/18 0807)     LOS: 2 days    Time spent in minutes: 35    Debbe Odea, MD [facility] Pager: [email or web address] Password [facility] 05/15/2018, 2:31 PM

## 2018-05-15 NOTE — Sedation Documentation (Signed)
3cc of air removed from TR band per order. No bleeding, no hematoma.

## 2018-05-15 NOTE — Interval H&P Note (Signed)
History and Physical Interval Note:  05/15/2018 4:02 PM  Gloria Joseph  has presented today for cardiac cath with the diagnosis of cardiomyopathy.  The various methods of treatment have been discussed with the patient and family. After consideration of risks, benefits and other options for treatment, the patient has consented to  Procedure(s): LEFT HEART CATH AND CORONARY ANGIOGRAPHY (N/A) as a surgical intervention .  The patient's history has been reviewed, patient examined, no change in status, stable for surgery.  I have reviewed the patient's chart and labs.  Questions were answered to the patient's satisfaction.    Cath Lab Visit (complete for each Cath Lab visit)  Clinical Evaluation Leading to the Procedure:   ACS: Yes.    Non-ACS:    Anginal Classification: CCS II  Anti-ischemic medical therapy: Minimal Therapy (1 class of medications)  Non-Invasive Test Results: No non-invasive testing performed  Prior CABG: No previous CABG         Lauree Chandler

## 2018-05-15 NOTE — Sedation Documentation (Signed)
Groin and pulses assessed at bedside with Roselyn Reef, RN.

## 2018-05-16 ENCOUNTER — Inpatient Hospital Stay (HOSPITAL_COMMUNITY): Payer: Medicare Other

## 2018-05-16 DIAGNOSIS — J988 Other specified respiratory disorders: Secondary | ICD-10-CM

## 2018-05-16 DIAGNOSIS — I6602 Occlusion and stenosis of left middle cerebral artery: Secondary | ICD-10-CM

## 2018-05-16 DIAGNOSIS — E119 Type 2 diabetes mellitus without complications: Secondary | ICD-10-CM

## 2018-05-16 DIAGNOSIS — I63412 Cerebral infarction due to embolism of left middle cerebral artery: Secondary | ICD-10-CM

## 2018-05-16 DIAGNOSIS — I5181 Takotsubo syndrome: Principal | ICD-10-CM

## 2018-05-16 DIAGNOSIS — J9600 Acute respiratory failure, unspecified whether with hypoxia or hypercapnia: Secondary | ICD-10-CM

## 2018-05-16 LAB — HEMOGLOBIN A1C
Hgb A1c MFr Bld: 5.6 % (ref 4.8–5.6)
Mean Plasma Glucose: 114.02 mg/dL

## 2018-05-16 LAB — BASIC METABOLIC PANEL
Anion gap: 10 (ref 5–15)
BUN: 19 mg/dL (ref 8–23)
CO2: 22 mmol/L (ref 22–32)
Calcium: 8.5 mg/dL — ABNORMAL LOW (ref 8.9–10.3)
Chloride: 107 mmol/L (ref 98–111)
Creatinine, Ser: 1.28 mg/dL — ABNORMAL HIGH (ref 0.44–1.00)
GFR calc Af Amer: 46 mL/min — ABNORMAL LOW (ref >60)
GFR calc non Af Amer: 40 mL/min — ABNORMAL LOW (ref >60)
Glucose, Bld: 117 mg/dL — ABNORMAL HIGH (ref 70–99)
Potassium: 3.8 mmol/L (ref 3.5–5.1)
Sodium: 139 mmol/L (ref 135–145)

## 2018-05-16 LAB — LIPID PANEL
Cholesterol: 88 mg/dL (ref 0–200)
HDL: 38 mg/dL — ABNORMAL LOW (ref >40)
LDL Cholesterol: 35 mg/dL (ref 0–99)
Total CHOL/HDL Ratio: 2.3 RATIO
Triglycerides: 77 mg/dL (ref <150)
VLDL: 15 mg/dL (ref 0–40)

## 2018-05-16 LAB — CBC
HCT: 28.1 % — ABNORMAL LOW (ref 36.0–46.0)
Hemoglobin: 8.6 g/dL — ABNORMAL LOW (ref 12.0–15.0)
MCH: 27.5 pg (ref 26.0–34.0)
MCHC: 30.6 g/dL (ref 30.0–36.0)
MCV: 89.8 fL (ref 80.0–100.0)
Platelets: 246 10*3/uL (ref 150–400)
RBC: 3.13 MIL/uL — AB (ref 3.87–5.11)
RDW: 15.2 % (ref 11.5–15.5)
WBC: 7.3 10*3/uL (ref 4.0–10.5)
nRBC: 0 % (ref 0.0–0.2)

## 2018-05-16 LAB — GLUCOSE, CAPILLARY
Glucose-Capillary: 91 mg/dL (ref 70–99)
Glucose-Capillary: 101 mg/dL — ABNORMAL HIGH (ref 70–99)
Glucose-Capillary: 98 mg/dL (ref 70–99)
Glucose-Capillary: 97 mg/dL (ref 70–99)

## 2018-05-16 LAB — PROTIME-INR
INR: 1.47
Prothrombin Time: 17.7 seconds — ABNORMAL HIGH (ref 11.4–15.2)

## 2018-05-16 LAB — TSH: TSH: 0.92 u[IU]/mL (ref 0.350–4.500)

## 2018-05-16 MED ORDER — INSULIN ASPART 100 UNIT/ML ~~LOC~~ SOLN
0.0000 [IU] | SUBCUTANEOUS | Status: DC
  Administered 2018-05-18 (×2): 2 [IU] via SUBCUTANEOUS

## 2018-05-16 MED ORDER — PRAVASTATIN SODIUM 40 MG PO TABS
80.0000 mg | ORAL_TABLET | Freq: Every day | ORAL | Status: DC
  Administered 2018-05-18 – 2018-05-22 (×5): 80 mg via ORAL
  Filled 2018-05-16 (×5): qty 2

## 2018-05-16 MED ORDER — CHLORHEXIDINE GLUCONATE 0.12 % MT SOLN
OROMUCOSAL | Status: AC
  Filled 2018-05-16: qty 15

## 2018-05-16 MED ORDER — ASPIRIN EC 325 MG PO TBEC: 325.0000 mg | DELAYED_RELEASE_TABLET | Freq: Every day | ORAL | Status: DC

## 2018-05-16 NOTE — Progress Notes (Signed)
Progress Note  Patient Name: Gloria Joseph Date of Encounter: 05/16/2018  Primary Cardiologist:   Thompson Grayer, MD   Subjective   Intubated.  Opens eyes.   Inpatient Medications    Scheduled Meds: . [START ON 05/17/2018] aspirin EC  325 mg Oral Daily  . atorvastatin  80 mg Oral q1800  . carvedilol  3.125 mg Oral BID  . chlorhexidine gluconate (MEDLINE KIT)  15 mL Mouth Rinse BID  . cholecalciferol  1,000 Units Oral Daily  . citalopram  20 mg Oral Daily  . insulin aspart  0-15 Units Subcutaneous Q4H  . levothyroxine  112 mcg Oral Q0600  . mouth rinse  15 mL Mouth Rinse 10 times per day  . multivitamin with minerals  1 tablet Oral Daily  . omega-3 acid ethyl esters  1 g Oral Daily  . vitamin B-12  1,000 mcg Oral Daily   Continuous Infusions: . sodium chloride 75 mL/hr at 05/16/18 0700  . clevidipine Stopped (05/16/18 0307)  . fentaNYL infusion INTRAVENOUS 100 mcg/hr (05/16/18 0800)  . midazolam (VERSED) infusion 1 mg/hr (05/16/18 0800)   PRN Meds: acetaminophen **OR** acetaminophen (TYLENOL) oral liquid 160 mg/5 mL **OR** acetaminophen, hydrALAZINE, midazolam, nitroGLYCERIN, ondansetron **OR** ondansetron (ZOFRAN) IV, senna-docusate   Vital Signs    Vitals:   05/16/18 0809 05/16/18 0820 05/16/18 0826 05/16/18 1150  BP:      Pulse:    60  Resp:    19  Temp: 98.3 F (36.8 C)     TempSrc: Axillary     SpO2:  100% 100% 100%  Weight:      Height:        Intake/Output Summary (Last 24 hours) at 05/16/2018 1219 Last data filed at 05/16/2018 0800 Gross per 24 hour  Intake 1640.33 ml  Output 10 ml  Net 1630.33 ml   Filed Weights   05/13/18 0252 05/15/18 0300 05/15/18 2040  Weight: 95.6 kg 93.4 kg 94.9 kg    Telemetry    NSR - Personally Reviewed  ECG    NSR, PACs, - Personally Reviewed  Physical Exam   GEN: No acute distress. Intuabed Neck: No  JVD Cardiac: RRR, no murmurs, rubs, or gallops.  Respiratory: Clear  to auscultation  bilaterally. GI: Soft, nontender, non-distended  MS: No  edema; No deformity. Neuro:    Moves all extremities Psych:   Unable to assess.    Labs    Chemistry Recent Labs  Lab 05/14/18 0334 05/15/18 0512 05/15/18 2140 05/16/18 0425  NA 135 139 137 139  K 4.1 4.1 4.1 3.8  CL 102 104  --  107  CO2 25 22  --  22  GLUCOSE 123* 132*  --  117*  BUN 16 19  --  19  CREATININE 1.22* 1.25*  --  1.28*  CALCIUM 8.9 9.3  --  8.5*  GFRNONAA 42* 41*  --  40*  GFRAA 49* 48*  --  46*  ANIONGAP 8 13  --  10     Hematology Recent Labs  Lab 05/14/18 0334 05/15/18 0512 05/15/18 2140 05/16/18 0425  WBC 5.0 5.2  --  7.3  RBC 3.08* 3.34*  --  3.13*  HGB 8.7* 9.2* 9.2* 8.6*  HCT 27.7* 30.3* 27.0* 28.1*  MCV 89.9 90.7  --  89.8  MCH 28.2 27.5  --  27.5  MCHC 31.4 30.4  --  30.6  RDW 15.0 14.9  --  15.2  PLT 256 275  --  246  Cardiac Enzymes Recent Labs  Lab 05/13/18 0226 05/13/18 0619 05/13/18 1234  TROPONINI 1.00* 1.46* 1.15*   No results for input(s): TROPIPOC in the last 168 hours.   BNPNo results for input(s): BNP, PROBNP in the last 168 hours.   DDimer No results for input(s): DDIMER in the last 168 hours.   Radiology    Ct Angio Head W Or Wo Contrast  Result Date: 05/15/2018 CLINICAL DATA:  Expressive aphasia. Clinical concern for left MCA LVO. EXAM: CT ANGIOGRAPHY HEAD AND NECK TECHNIQUE: Multidetector CT imaging of the head and neck was performed using the standard protocol during bolus administration of intravenous contrast. Multiplanar CT image reconstructions and MIPs were obtained to evaluate the vascular anatomy. Carotid stenosis measurements (when applicable) are obtained utilizing NASCET criteria, using the distal internal carotid diameter as the denominator. CONTRAST:  102m ISOVUE-370 IOPAMIDOL (ISOVUE-370) INJECTION 76% COMPARISON:  Head and neck CTA 04/11/2011 FINDINGS: CTA NECK FINDINGS Aortic arch: Standard 3 vessel aortic arch, incompletely imaged. Widely  patent arch vessel origins. Calcified plaque at the right subclavian artery origin and in the proximal left subclavian artery without significant stenosis. Right carotid system: Calcified plaque about the right carotid bifurcation with severe motion artifact through this region precluding assessment for stenosis. Wide patency of the common carotid artery more proximally. Patent proximal cervical ICA. Obscuration of the more distal cervical ICA due to motion. Left carotid system: Calcified plaque about the right carotid bifurcation with severe motion artifact through this region precluding assessment for stenosis. Wide patency of the common carotid artery more proximally. Patent proximal and mid cervical ICA. Obscuration of the distal most cervical ICA due to motion. Vertebral arteries: Both vertebral arteries are patent proximally without evidence of significant origin stenosis, however the V2 segments are largely obscured by motion artifact. The V3 segments are grossly patent. Skeleton: Focally advanced disc degeneration at C5-6 with uncovertebral spurring resulting in asymmetric right neural foraminal stenosis. Other neck: Diminutive thyroid. Upper chest: No apical lung consolidation. Review of the MIP images confirms the above findings CTA HEAD FINDINGS Anterior circulation: The intracranial internal carotid arteries are grossly patent but poorly evaluated due to motion artifact. The A1 and M1 segments are grossly patent bilaterally. Branch vessel evaluation is limited by severe motion artifact. The left M2 inferior division is patent proximally, however the left M2 superior division trunk (a patent and sizable vessel on the 2012 CTA) is not clearly seen which is concerning for occlusion in this clinical scenario. It is worth noting however that motion artifact also results in poor visualization of right MCA branch vessels as well. No large aneurysm is identified. Posterior circulation: The intracranial vertebral  arteries are grossly patent to the basilar. The basilar artery is obscured proximally by motion artifact but appears widely patent more distally. The PCAs are patent proximally. No large aneurysm is identified. Venous sinuses: Grossly patent where adequately visualized given arterial phase contrast timing and motion artifact. Anatomic variants: None. Review of the MIP images confirms the above findings IMPRESSION: 1. Severely motion degraded examination. 2. Findings suspicious for proximal left M2 occlusion. 3. No evidence of large vessel occlusion more proximally. 4. Calcified plaque at the carotid bifurcations in the neck. Nondiagnostic evaluation for stenosis due to motion. The study was reviewed in person with Dr. KLeonel Ramsayon 05/15/2018 at 6:35 p.m. Electronically Signed   By: ALogan BoresM.D.   On: 05/15/2018 19:00   Ct Angio Neck W Or Wo Contrast  Result Date: 05/15/2018 CLINICAL DATA:  Expressive  aphasia. Clinical concern for left MCA LVO. EXAM: CT ANGIOGRAPHY HEAD AND NECK TECHNIQUE: Multidetector CT imaging of the head and neck was performed using the standard protocol during bolus administration of intravenous contrast. Multiplanar CT image reconstructions and MIPs were obtained to evaluate the vascular anatomy. Carotid stenosis measurements (when applicable) are obtained utilizing NASCET criteria, using the distal internal carotid diameter as the denominator. CONTRAST:  22m ISOVUE-370 IOPAMIDOL (ISOVUE-370) INJECTION 76% COMPARISON:  Head and neck CTA 04/11/2011 FINDINGS: CTA NECK FINDINGS Aortic arch: Standard 3 vessel aortic arch, incompletely imaged. Widely patent arch vessel origins. Calcified plaque at the right subclavian artery origin and in the proximal left subclavian artery without significant stenosis. Right carotid system: Calcified plaque about the right carotid bifurcation with severe motion artifact through this region precluding assessment for stenosis. Wide patency of the common  carotid artery more proximally. Patent proximal cervical ICA. Obscuration of the more distal cervical ICA due to motion. Left carotid system: Calcified plaque about the right carotid bifurcation with severe motion artifact through this region precluding assessment for stenosis. Wide patency of the common carotid artery more proximally. Patent proximal and mid cervical ICA. Obscuration of the distal most cervical ICA due to motion. Vertebral arteries: Both vertebral arteries are patent proximally without evidence of significant origin stenosis, however the V2 segments are largely obscured by motion artifact. The V3 segments are grossly patent. Skeleton: Focally advanced disc degeneration at C5-6 with uncovertebral spurring resulting in asymmetric right neural foraminal stenosis. Other neck: Diminutive thyroid. Upper chest: No apical lung consolidation. Review of the MIP images confirms the above findings CTA HEAD FINDINGS Anterior circulation: The intracranial internal carotid arteries are grossly patent but poorly evaluated due to motion artifact. The A1 and M1 segments are grossly patent bilaterally. Branch vessel evaluation is limited by severe motion artifact. The left M2 inferior division is patent proximally, however the left M2 superior division trunk (a patent and sizable vessel on the 2012 CTA) is not clearly seen which is concerning for occlusion in this clinical scenario. It is worth noting however that motion artifact also results in poor visualization of right MCA branch vessels as well. No large aneurysm is identified. Posterior circulation: The intracranial vertebral arteries are grossly patent to the basilar. The basilar artery is obscured proximally by motion artifact but appears widely patent more distally. The PCAs are patent proximally. No large aneurysm is identified. Venous sinuses: Grossly patent where adequately visualized given arterial phase contrast timing and motion artifact. Anatomic  variants: None. Review of the MIP images confirms the above findings IMPRESSION: 1. Severely motion degraded examination. 2. Findings suspicious for proximal left M2 occlusion. 3. No evidence of large vessel occlusion more proximally. 4. Calcified plaque at the carotid bifurcations in the neck. Nondiagnostic evaluation for stenosis due to motion. The study was reviewed in person with Dr. KLeonel Ramsayon 05/15/2018 at 6:35 p.m. Electronically Signed   By: ALogan BoresM.D.   On: 05/15/2018 19:00   Dg Chest Port 1 View  Result Date: 05/15/2018 CLINICAL DATA:  Stroke protocol, pt intubated EXAM: PORTABLE CHEST 1 VIEW COMPARISON:  05/12/2018 FINDINGS: Endotracheal tube is in place, tip approximately 2.5 centimeters above the carina. Heart is enlarged. There is patchy infiltrate in the LEFT lung base and increased compared to prior study. Mildly prominent interstitial markings appear stable. Surgical clips are identified in the RIGHT axillary region. IMPRESSION: Cardiomegaly. LEFT LOWER lobe infiltrate. Electronically Signed   By: ENolon NationsM.D.   On: 05/15/2018 21:17  Ct Head Code Stroke Wo Contrast  Result Date: 05/15/2018 CLINICAL DATA:  Code stroke.  Expressive aphasia. EXAM: CT HEAD WITHOUT CONTRAST TECHNIQUE: Contiguous axial images were obtained from the base of the skull through the vertex without intravenous contrast. COMPARISON:  05/13/2018 brain MRI and CT FINDINGS: Brain: There is no evidence of acute infarct, intracranial hemorrhage, mass, midline shift, or extra-axial fluid collection. The ventricles and sulci are normal. Patchy to confluent cerebral white matter hypodensities are unchanged and nonspecific but compatible with severe chronic small vessel ischemic disease. Vascular: Calcified atherosclerosis at the skull base. Residual vascular contrast from coronary catheterization earlier today Skull: No fracture or focal osseous lesion. Sinuses/Orbits: Paranasal sinuses and mastoid air  cells are clear. Bilateral cataract extraction. Other: None. ASPECTS [facility] Stroke Program Early CT Score) - Ganglionic level infarction (caudate, lentiform nuclei, internal capsule, insula, M1-M3 cortex): 7 - Supraganglionic infarction (M4-M6 cortex): 3 Total score (0-10 with 10 being normal): 10 IMPRESSION: 1. No evidence of acute intracranial abnormality. 2. ASPECTS is 10. 3. Severe chronic small vessel ischemic disease. These results were communicated to Dr. Leonel Ramsay at 6:02 pm on 05/15/2018 by text page via the [facility] messaging system. Electronically Signed   By: Logan Bores M.D.   On: 05/15/2018 18:02    Cardiac Studies    Echo:    Study Conclusions  - Left ventricle: The cavity size was mildly dilated. Wall   thickness was increased in a pattern of mild LVH. Systolic   function was severely reduced. The estimated ejection fraction   was in the range of 20% to 25%. There is akinesis of the   apicalanteroseptal, anterior, and inferior myocardium. Doppler   parameters are consistent with abnormal left ventricular   relaxation (grade 1 diastolic dysfunction). Doppler parameters   are consistent with high ventricular filling pressure. - Aortic valve: There was mild regurgitation. - Left atrium: The atrium was mildly dilated. - Right ventricle: The cavity size was mildly dilated. Systolic   function was mildly to moderately reduced. - Pulmonary arteries: PA peak pressure: 45 mm Hg (S). - Pericardium, extracardiac: A trivial pericardial effusion was   identified posterior to the heart.  Cardiac cath:    1. No angiographic evidence of CAD 2. Non-ischemic cardiomyopathy, possible stress induced cardiomyopathy (Takotsubo's cardiomyopathy).    Patient Profile     77 y.o. female with a hx of hypertension, hyperlipidemia, diabetes mellitus, hypothyroidism, depression,Paroxysmalatrial fibrillation on Coumadin, bradycardia, chronic anemia, breast cancer (right mastectomy)and  memory issuewhopresented after mechanical fall and seenfor the evaluation of NSTEMIat the request of Dr. Blaine Hamper.  Found to have NSTEMI and found to have non ischemic CM on LHC.  Developed aphasia after Jed[place]ito ane neurology consulted, and underwent cerebral arteriogram.  Required intubation for procedure.  Assessment & Plan    Non ischemic cardiomyopathy:  Echo as above.  A repeat was ordered but I am not clear how this would change management and will cancel this pending further discussions with the primary and consulting teams.  I did speak with neurology.  Also, I reviewed the results of the echo.   Permissive HTN  Atrial fib:   NSR currently.  DOAC to be considered pending the results of MRI.   Discussed with the family and neurology.   For questions or updates, please contact [place] Please consult [email or web address] for contact info under Cardiology/STEMI.   Signed, Minus Breeding, MD  05/16/2018, 12:19 PM

## 2018-05-16 NOTE — Progress Notes (Signed)
STROKE TEAM PROGRESS NOTE   SUBJECTIVE (INTERVAL HISTORY) Her RN is at the bedside.  Overall his condition is stable. Pt still intubated, but potentially extubatable. Able to open eyes with repetitive stimulation, not following commands. Moving all extremities on pain stimulation.   OBJECTIVE Vitals:   05/16/18 0530 05/16/18 0600 05/16/18 0630 05/16/18 0700  BP: (!) 124/44 124/90 (!) 118/48 (!) 117/43  Pulse: (!) 48 (!) 42 (!) 48 (!) 44  Resp: 16 16 16 16   Temp:      TempSrc:      SpO2: 100% 100% 100% 100%  Weight:      Height:        CBC:  Recent Labs  Lab 05/12/18 2304  05/15/18 0512 05/15/18 2140 05/16/18 0425  WBC 7.5   < > 5.2  --  7.3  NEUTROABS 5.6  --   --   --   --   HGB 10.2*   < > 9.2* 9.2* 8.6*  HCT 32.7*   < > 30.3* 27.0* 28.1*  MCV 91.1   < > 90.7  --  89.8  PLT 277   < > 275  --  246   < > = values in this interval not displayed.    Basic Metabolic Panel:  Recent Labs  Lab 05/15/18 0512 05/15/18 2140 05/16/18 0425  NA 139 137 139  K 4.1 4.1 3.8  CL 104  --  107  CO2 22  --  22  GLUCOSE 132*  --  117*  BUN 19  --  19  CREATININE 1.25*  --  1.28*  CALCIUM 9.3  --  8.5*    Lipid Panel:     Component Value Date/Time   CHOL 88 05/16/2018 0425   TRIG 77 05/16/2018 0425   HDL 38 (L) 05/16/2018 0425   CHOLHDL 2.3 05/16/2018 0425   VLDL 15 05/16/2018 0425   LDLCALC 35 05/16/2018 0425   HgbA1c:  Lab Results  Component Value Date   HGBA1C 5.6 05/16/2018   Urine Drug Screen: No results found for: LABOPIA, COCAINSCRNUR, LABBENZ, AMPHETMU, THCU, LABBARB  Alcohol Level No results found for: ETH  IMAGING   Ct Angio Head W Or Wo Contrast Ct Angio Neck W Or Wo Contrast 05/15/2018 IMPRESSION:  1. Severely motion degraded examination.  2. Findings suspicious for proximal left M2 occlusion.  3. No evidence of large vessel occlusion more proximally.  4. Calcified plaque at the carotid bifurcations in the neck. Nondiagnostic evaluation for  stenosis due to motion.    Dg Chest Port 1 View 05/15/2018 IMPRESSION:  Cardiomegaly. LEFT LOWER lobe infiltrate.    Ct Head Code Stroke Wo Contrast 05/15/2018 IMPRESSION:  1. No evidence of acute intracranial abnormality.  2. ASPECTS is 10.  3. Severe chronic small vessel ischemic disease.    MRI Brain WO Contrast pending    Transthoracic Echocardiogram  05/13/2018 Study Conclusion - Left ventricle: The cavity size was mildly dilated. Wall   thickness was increased in a pattern of mild LVH. Systolic   function was severely reduced. The estimated ejection fraction   was in the range of 20% to 25%. There is akinesis of the   apicalanteroseptal, anterior, and inferior myocardium. Doppler   parameters are consistent with abnormal left ventricular   relaxation (grade 1 diastolic dysfunction). Doppler parameters   are consistent with high ventricular filling pressure. - Aortic valve: There was mild regurgitation. - Left atrium: The atrium was mildly dilated. - Right ventricle: The cavity size  was mildly dilated. Systolic   function was mildly to moderately reduced. - Pulmonary arteries: PA peak pressure: 45 mm Hg (S). - Pericardium, extracardiac: A trivial pericardial effusion was   identified posterior to the heart.   IR 4 vessel cerebral arteriogram. RT CFA approach. Findings. 1. Angiographically no evidence of occlusions,stenosis,intraluminal filling defects,dissections or of AV shunting. 2.Venous outflow WNLs.    PHYSICAL EXAM Blood pressure (!) 117/43, pulse (!) 44, temperature 98.6 F (37 C), temperature source Axillary, resp. rate 16, height 5\' 7"  (1.702 m), weight 94.9 kg, SpO2 100 %.  General - Well nourished, well developed, intubated on low dose sedation.  Ophthalmologic - fundi not visualized due to noncooperation.  Cardiovascular - bradycardia with frequent PACs on tele  Neuro - intubated on low dose sedation, able to open eyes after repetitive  stimulation, eyes closed without continued stimulation. Eyes able to attend to both directions. Inconsistent with visual threat testing. PERRL. Facial symmetry not able to test due to ET tube. Tongue midline in mouth. LUE and BLE strong movement on pain stimulation. RUE 3/5 on pain stimulation. DTR 1+ and babinski not cooperative. Sensation, coordination and gait not tested.    ASSESSMENT/PLAN Ms. AVAIAH STEMPEL is a 77 y.o. female with history of A. Fib (coumadin), HTN, HLD, breast cancer, NSTEMI 05/13/2018.  presenting with speech difficulties after cardiac cath. She did not receive IV t-PA due to heparin already administered.  Suspected left MCA infarct -embolic in nature, could be due to A. fib off Coumadin  CT head - No evidence of acute intracranial abnormality.   MRI head - pending  CTA H&N - Severely motion degraded examination. Findings suspicious for proximal left M2 occlusion.   Cerebral Angiogram - no evidence of occlusions or stenosis.  2D Echo - 05/13/2018 - EF 20% to 25%, concerning for Takotsubo cardiomyopathy  LDL -35  HgbA1c 5.6  VTE prophylaxis - SCDs  Diet - NPO  warfarin daily prior to admission, now on aspirin 300 mg suppository daily or ASA 325 mg PO daily. Will restart anticoagulation if MRI no large infarct.  Ongoing aggressive stroke risk factor management  Therapy recommendations:  pending  Disposition:  Pending  Non-STEMI with cardiomyopathy  05/13/2018 off Coumadin for cardiac cath  Cardiac cath unremarkable  On aspirin now  EF 20 to 25% now concerning forTakotsubo cardiomyopathy  Cardiology on board  Gentle hydration  On Coreg  A. fib, chronic  On Coumadin PTA  INR 2.69-2.43-1.57-1.47  Post cath  EF 20 to 25%  Will resume anticoagulation if MRI no large infarct  Respiratory failure  Intubated for airway protection and mental status change  CCM on board  Currently intubated and low-dose sedation  Extubate as  able  Hypertension  Stable  On Coreg  Hold off hydralazine at this time . BP goal normotensive  Hyperlipidemia  Lipid lowering medication PTA: Pravachol 80 mg daily  LDL 35, goal < 70  Current lipid lowering medication: Pravastatin 80 mg daily  Continue statin at discharge  Other Stroke Risk Factors  Advanced age  Obesity, Body mass index is 32.77 kg/m., recommend weight loss, diet and exercise as appropriate   Atrial fibrillation  Coronary artery disease   Other Active Problems  Anemia due to chronic disease - 9.2->8.6  Mildly elevated creatinine 1.25->1.28  Hospital day # 3  This patient is critically ill due to suspected stroke, CHF, non-STEMI, A. fib and at significant risk of neurological worsening, death form recurrent stroke, hemorrhagic conversion, heart failure,  seizure. This patient's care requires constant monitoring of vital signs, hemodynamics, respiratory and cardiac monitoring, review of multiple databases, neurological assessment, discussion with family, other specialists and medical decision making of high complexity. I spent 35 minutes of neurocritical care time in the care of this patient.  I have discussed with Dr. Percival Spanish from cardiology over the phone.  Rosalin Hawking, MD PhD Stroke Neurology 05/16/2018 9:14 PM  To contact Stroke Continuity provider, please refer to [email or web address] After hours, contact General Neurology

## 2018-05-16 NOTE — Progress Notes (Signed)
Initial Nutrition Assessment  DOCUMENTATION CODES:  Obesity unspecified  INTERVENTION:  If unable to extubate tomorrow, would recommend initiating tube feeding  Vital HP at goal rate of 45 ml/h (1080 ml per day) and Prostat  30 ml BID to provide 1280 kcals, 125 gm protein, 876 ml free water daily.  NUTRITION DIAGNOSIS:  Inadequate oral intake related to inability to eat as evidenced by NPO status.  GOAL:  Provide needs based on ASPEN/SCCM guidelines  MONITOR:  I & O's, Vent status, Labs, Diet advancement  REASON FOR ASSESSMENT:  Ventilator    ASSESSMENT:  77 y/o female PMHx HTN/HLD, DM2, depression, Afib, breast cancer s/p mastectomy. [place] on her way to church and hit head. In ED, Found to have elevated troponin and suffered possible NSTEMI. Underwent LHC 12/27 to evaluated for ischemic disease. Post Procedure, code stroke called after pt developed aphasia and R facial droop. Pt intubated for arteriogram. Remains intubated afterwards.  Pt intubated, sedated. Unable to converse with RD. No one else in room to provide any history.   Per RT at bedside, pt had been weaning well, but experienced a period of near apnea and was placed back on full vent support. She has had her sedation turned down and is making progress.  Per review of meal documentation. Pt was eating well immediately up to being made NPO for LHC.   Weight wise, pt was admitted at 210.75 lbs, which is about what her weight is today. Lowest wt this admission has been 206 lbs.   Will leave TF recs in event pt is unable to extubate in next day or so.   Patient is currently intubated on ventilator support MV: 9.0 L/min Temp (24hrs), Avg:97.9 F (36.6 C), Min:96.7 F (35.9 C), Max:98.9 F (37.2 C) Propofol: None  Labs: Bgs: 90-140 recently, Creat: 1.19 (.94 on arrival) Meds: IVF, (cleviprex and versed are both off)  Sedation/Analgesia: Fentanyl, D3, MVI with min, Omega 3s, B12  Recent Labs  Lab 05/14/18 0334  05/15/18 0512 05/15/18 2140 05/16/18 0425  NA 135 139 137 139  K 4.1 4.1 4.1 3.8  CL 102 104  --  107  CO2 25 22  --  22  BUN 16 19  --  19  CREATININE 1.22* 1.25*  --  1.28*  CALCIUM 8.9 9.3  --  8.5*  GLUCOSE 123* 132*  --  117*   NUTRITION - FOCUSED PHYSICAL EXAM:   Most Recent Value  Orbital Region  No depletion  Upper Arm Region  No depletion  Thoracic and Lumbar Region  No depletion  Buccal Region  No depletion  Temple Region  No depletion  Clavicle Bone Region  No depletion  Clavicle and Acromion Bone Region  No depletion  Scapular Bone Region  Unable to assess  Dorsal Hand  No depletion  Patellar Region  No depletion  Anterior Thigh Region  No depletion  Posterior Calf Region  No depletion  Edema (RD Assessment)  None  Hair  Reviewed  Eyes  Reviewed  Mouth  Reviewed  Skin  Reviewed  Nails  Reviewed     Diet Order:   Diet Order            Diet NPO time specified  Diet effective now             EDUCATION NEEDS:  No education needs have been identified at this time  Skin:  Skin Assessment: Reviewed RN Assessment  Last BM:  12/25  Height:  Ht Readings  from Last 1 Encounters:  05/15/18 5\' 7"  (1.702 m)   Weight:  Wt Readings from Last 1 Encounters:  05/16/18 95.9 kg   Wt Readings from Last 10 Encounters:  05/16/18 95.9 kg  04/01/18 89.5 kg  07/01/16 88.5 kg  05/15/16 89.1 kg  04/25/16 88.9 kg  04/01/16 89.1 kg  07/28/14 106.1 kg  06/30/14 106.1 kg  06/06/14 104.3 kg  05/18/14 106.6 kg   Ideal Body Weight:  61.36 kg  BMI:  Body mass index is 33.11 kg/m.  Estimated Nutritional Needs:  Kcal:  1040-1330 kcals Protein:  >123g Pro (2g/kg ibw) Fluid:  Per MD goals  Burtis Junes RD, LDN, CNSC Clinical Nutrition Available Tues-Sat via Pager: [phone] 05/16/2018 4:08 PM

## 2018-05-16 NOTE — Progress Notes (Signed)
[facility] Progress Note Patient Name: Gloria Joseph DOB: 23-Jun-1940 MRN: [id number]   Date of Service  05/16/2018  HPI/Events of Note  Oliguria - Bladder scan with 430 mL.   eICU Interventions  Will place Foley catheter.      Intervention Category Intermediate Interventions: Oliguria - evaluation and management  [name] 05/16/2018, 2:08 AM

## 2018-05-16 NOTE — Progress Notes (Signed)
NAME:  Gloria Joseph, MRN:  [id number], DOB:  [birth year], LOS: 3 ADMISSION DATE:  05/12/2018, CONSULTATION DATE:  05/15/18 REFERRING MD: Dr. Erlinda Hong, Neurology CHIEF COMPLAINT:  Ventilation management   Brief History   77 yo female fell at church and hit her head.  Later developed nausea and vomiting.  Found to have NSTEMI and found to have non ischemic CM on LHC.  Developed aphasia after  ane neurology consulted, and underwent cerebral arteriogram.  Required intubation for procedure.  Past Medical History  HTN, HLD, DM, Hypothyroidism, Depression, A fib on coumadin  Significant Hospital Events   12/24 Admit 12/25 cardiology consulted 12/27 LHC, aphasia, cerebral arteriogram  Consults:  Cardiology Neurology  Neuro IR  Procedures:  ETT 12/27 >>   Significant Diagnostic Tests:  Echo 12/25 >> EF 20 to 25%, grade 1 DD LHC 12/27 >> non ischemic CM (Takotsubo) Cerebral arteriogram 12/27 >> no evidence of occlusion  Micro Data:    Antimicrobials:     Interim history/subjective:  On full vent support.  Objective   Blood pressure (!) 117/43, pulse (!) 44, temperature 98.6 F (37 C), temperature source Axillary, resp. rate 16, height 5\' 7"  (1.702 m), weight 94.9 kg, SpO2 100 %.    Vent Mode: PRVC FiO2 (%):  [40 %-50 %] 40 % Set Rate:  [16 bmp] 16 bmp Vt Set:  [500 mL] 500 mL PEEP:  [5 cmH20] 5 cmH20 Plateau Pressure:  [18 cmH20-20 cmH20] 18 cmH20   Intake/Output Summary (Last 24 hours) at 05/16/2018 0735 Last data filed at 05/16/2018 0700 Gross per 24 hour  Intake 1554.28 ml  Output 10 ml  Net 1544.28 ml   Filed Weights   05/13/18 0252 05/15/18 0300 05/15/18 2040  Weight: 95.6 kg 93.4 kg 94.9 kg    Examination:  General - sedated Eyes - pupils pinpoint ENT - ETT in place Cardiac - irregular, bradycardic Chest - equal breath sounds b/l, no wheezing or rales Abdomen - soft, non tender, + bowel sounds GU - no lesions noted Extremities - no cyanosis,  clubbing, or edema Skin - no rashes Neuro - RASS -4   Resolved Hospital Problem list     Assessment & Plan:   Compromised airway. Plan - full vent support until neuro status stable - f/u CXR  Aphasia. Plan - per neurology - RASS goal 0 to -1  Hypertension, NSTEMI, non ischemic CM, A fib, HLD. Plan - goal SBP 120 to 140 per neurology - anticoagulation per cardiology  DM type II. Hypothyroidism. Plan - SSI - synthroid  Anemia of chronic disease. Plan - f/u CBC    Best practice:  DVT prophylaxis: SCDs SUP: Pepcid Nutrition: NPO Goals of care: full code Family discussions: no family at bedside  Labs    CMP Latest Ref Rng & Units 05/16/2018 05/15/2018 05/15/2018  Glucose 70 - 99 mg/dL 117(H) - 132(H)  BUN 8 - 23 mg/dL 19 - 19  Creatinine 0.44 - 1.00 mg/dL 1.28(H) - 1.25(H)  Sodium 135 - 145 mmol/L 139 137 139  Potassium 3.5 - 5.1 mmol/L 3.8 4.1 4.1  Chloride 98 - 111 mmol/L 107 - 104  CO2 22 - 32 mmol/L 22 - 22  Calcium 8.9 - 10.3 mg/dL 8.5(L) - 9.3  Total Protein 6.0 - 8.3 g/dL - - -  Total Bilirubin 0.2 - 1.2 mg/dL - - -  Alkaline Phos 39 - 117 U/L - - -  AST 0 - 37 U/L - - -  ALT 0 -  35 U/L - - -   CBC Latest Ref Rng & Units 05/16/2018 05/15/2018 05/15/2018  WBC 4.0 - 10.5 K/uL 7.3 - 5.2  Hemoglobin 12.0 - 15.0 g/dL 8.6(L) 9.2(L) 9.2(L)  Hematocrit 36.0 - 46.0 % 28.1(L) 27.0(L) 30.3(L)  Platelets 150 - 400 K/uL 246 - 275   ABG    Component Value Date/Time   PHART 7.474 (H) 05/15/2018 2140   PCO2ART 33.7 05/15/2018 2140   PO2ART 80.0 (L) 05/15/2018 2140   HCO3 25.0 05/15/2018 2140   TCO2 26 05/15/2018 2140   O2SAT 97.0 05/15/2018 2140   CBG (last 3)  Recent Labs    05/15/18 1110 05/15/18 1704 05/15/18 2304  GLUCAP 99 89 145*   CC time 31 minutes  Chesley Mires, MD [place] 05/16/2018, 8:04 AM

## 2018-05-16 NOTE — Progress Notes (Signed)
Per RN, MD changed vent back to full support.

## 2018-05-16 NOTE — Progress Notes (Signed)
Patient was transported to MRI and back without incident

## 2018-05-17 ENCOUNTER — Inpatient Hospital Stay (HOSPITAL_COMMUNITY): Payer: Medicare Other

## 2018-05-17 DIAGNOSIS — J9601 Acute respiratory failure with hypoxia: Secondary | ICD-10-CM

## 2018-05-17 LAB — BASIC METABOLIC PANEL
Anion gap: 8 (ref 5–15)
BUN: 21 mg/dL (ref 8–23)
CO2: 22 mmol/L (ref 22–32)
Calcium: 8.4 mg/dL — ABNORMAL LOW (ref 8.9–10.3)
Chloride: 109 mmol/L (ref 98–111)
Creatinine, Ser: 1.3 mg/dL — ABNORMAL HIGH (ref 0.44–1.00)
GFR calc Af Amer: 46 mL/min — ABNORMAL LOW (ref >60)
GFR calc non Af Amer: 39 mL/min — ABNORMAL LOW (ref >60)
Glucose, Bld: 123 mg/dL — ABNORMAL HIGH (ref 70–99)
Potassium: 3.7 mmol/L (ref 3.5–5.1)
Sodium: 139 mmol/L (ref 135–145)

## 2018-05-17 LAB — GLUCOSE, CAPILLARY
GLUCOSE-CAPILLARY: 98 mg/dL (ref 70–99)
Glucose-Capillary: 102 mg/dL — ABNORMAL HIGH (ref 70–99)
Glucose-Capillary: 106 mg/dL — ABNORMAL HIGH (ref 70–99)
Glucose-Capillary: 108 mg/dL — ABNORMAL HIGH (ref 70–99)
Glucose-Capillary: 99 mg/dL (ref 70–99)
Glucose-Capillary: 123 mg/dL — ABNORMAL HIGH (ref 70–99)

## 2018-05-17 LAB — CBC
HCT: 25.8 % — ABNORMAL LOW (ref 36.0–46.0)
Hemoglobin: 7.9 g/dL — ABNORMAL LOW (ref 12.0–15.0)
MCH: 27.9 pg (ref 26.0–34.0)
MCHC: 30.6 g/dL (ref 30.0–36.0)
MCV: 91.2 fL (ref 80.0–100.0)
PLATELETS: 216 10*3/uL (ref 150–400)
RBC: 2.83 MIL/uL — ABNORMAL LOW (ref 3.87–5.11)
RDW: 15.3 % (ref 11.5–15.5)
WBC: 9.4 10*3/uL (ref 4.0–10.5)
nRBC: 0 % (ref 0.0–0.2)

## 2018-05-17 LAB — PROTIME-INR
INR: 1.41
PROTHROMBIN TIME: 17.1 s — AB (ref 11.4–15.2)

## 2018-05-17 MED ORDER — FUROSEMIDE 10 MG/ML IJ SOLN
20.0000 mg | Freq: Once | INTRAMUSCULAR | Status: AC
  Administered 2018-05-17: 20 mg via INTRAVENOUS

## 2018-05-17 MED ORDER — BISACODYL 10 MG RE SUPP
10.0000 mg | Freq: Once | RECTAL | Status: AC
  Administered 2018-05-17: 10 mg via RECTAL
  Filled 2018-05-17: qty 1

## 2018-05-17 MED ORDER — FAMOTIDINE IN NACL 20-0.9 MG/50ML-% IV SOLN
20.0000 mg | INTRAVENOUS | Status: DC
  Administered 2018-05-17 – 2018-05-18 (×2): 20 mg via INTRAVENOUS
  Filled 2018-05-17 (×2): qty 50

## 2018-05-17 MED ORDER — SUCRALFATE 1 GM/10ML PO SUSP: 1.0000 g | Freq: Four times a day (QID) | ORAL | Status: DC

## 2018-05-17 MED ORDER — LACTATED RINGERS IV SOLN
INTRAVENOUS | Status: DC
  Administered 2018-05-17: 16:00:00 via INTRAVENOUS

## 2018-05-17 MED ORDER — FUROSEMIDE 10 MG/ML IJ SOLN
INTRAMUSCULAR | Status: AC
  Filled 2018-05-17: qty 2

## 2018-05-17 NOTE — Plan of Care (Signed)
  Problem: Education: Goal: Knowledge of General Education information will improve Description Including pain rating scale, medication(s)/side effects and non-pharmacologic comfort measures Outcome: Progressing   Problem: Activity: Goal: Risk for activity intolerance will decrease Outcome: Progressing   Problem: Nutrition: Goal: Adequate nutrition will be maintained Outcome: Not Progressing Note:  Patient will not wake up enough to participate in a speech evaluation.    Problem: Coping: Goal: Level of anxiety will decrease Outcome: Not Progressing

## 2018-05-17 NOTE — Progress Notes (Signed)
Progress Note  Patient Name: RUQAYYAH LUTE Date of Encounter: 05/17/2018  Primary Cardiologist:   Thompson Grayer, MD   Subjective   Extubated.  Opens eyes.  Moves extremities.  Not purposeful.  Inpatient Medications    Scheduled Meds: . aspirin EC  325 mg Oral Daily  . bisacodyl  10 mg Rectal Once  . carvedilol  3.125 mg Oral BID  . chlorhexidine gluconate (MEDLINE KIT)  15 mL Mouth Rinse BID  . cholecalciferol  1,000 Units Oral Daily  . citalopram  20 mg Oral Daily  . insulin aspart  0-15 Units Subcutaneous Q4H  . levothyroxine  112 mcg Oral Q0600  . mouth rinse  15 mL Mouth Rinse 10 times per day  . multivitamin with minerals  1 tablet Oral Daily  . omega-3 acid ethyl esters  1 g Oral Daily  . pravastatin  80 mg Oral Daily  . vitamin B-12  1,000 mcg Oral Daily   Continuous Infusions: . sodium chloride 50 mL/hr at 05/17/18 0941  . clevidipine Stopped (05/16/18 0307)  . famotidine (PEPCID) IV     PRN Meds: acetaminophen **OR** acetaminophen (TYLENOL) oral liquid 160 mg/5 mL **OR** acetaminophen, hydrALAZINE, nitroGLYCERIN, ondansetron **OR** ondansetron (ZOFRAN) IV, senna-docusate   Vital Signs    Vitals:   05/17/18 0910 05/17/18 1000 05/17/18 1051 05/17/18 1100  BP:  (!) 125/47  129/68  Pulse: 75 (!) 38 70 68  Resp: 12 (!) '21 14 12  ' Temp:      TempSrc:      SpO2: 98% 99% 97% 96%  Weight:      Height:        Intake/Output Summary (Last 24 hours) at 05/17/2018 1154 Last data filed at 05/17/2018 0700 Gross per 24 hour  Intake 1291.91 ml  Output 475 ml  Net 816.91 ml   Filed Weights   05/15/18 0300 05/15/18 2040 05/16/18 1551  Weight: 93.4 kg 94.9 kg 95.9 kg    Telemetry    NSR with PAF - Personally Reviewed  ECG    NA- Personally Reviewed  Physical Exam   GENERAL:  Extubated.  No acute distress LUNGS:  Clear to auscultation bilaterally HEART:  PMI not displaced or sustained,S1 and S2 within normal limits, no S3, no S4, no clicks, no rubs,  no murmurs ABD:  Flat, positive bowel sounds normal in frequency in pitch, no bruits, no rebound, no guarding, no midline pulsatile mass, no hepatomegaly, no splenomegaly EXT:  2 plus pulses throughout, no edema, no cyanosis no clubbing   Labs    Chemistry Recent Labs  Lab 05/15/18 0512 05/15/18 2140 05/16/18 0425 05/17/18 0532  NA 139 137 139 139  K 4.1 4.1 3.8 3.7  CL 104  --  107 109  CO2 22  --  22 22  GLUCOSE 132*  --  117* 123*  BUN 19  --  19 21  CREATININE 1.25*  --  1.28* 1.30*  CALCIUM 9.3  --  8.5* 8.4*  GFRNONAA 41*  --  40* 39*  GFRAA 48*  --  46* 46*  ANIONGAP 13  --  10 8     Hematology Recent Labs  Lab 05/15/18 0512 05/15/18 2140 05/16/18 0425 05/17/18 0532  WBC 5.2  --  7.3 9.4  RBC 3.34*  --  3.13* 2.83*  HGB 9.2* 9.2* 8.6* 7.9*  HCT 30.3* 27.0* 28.1* 25.8*  MCV 90.7  --  89.8 91.2  MCH 27.5  --  27.5 27.9  MCHC 30.4  --  30.6 30.6  RDW 14.9  --  15.2 15.3  PLT 275  --  246 216    Cardiac Enzymes Recent Labs  Lab 05/13/18 0226 05/13/18 0619 05/13/18 1234  TROPONINI 1.00* 1.46* 1.15*   No results for input(s): TROPIPOC in the last 168 hours.   BNPNo results for input(s): BNP, PROBNP in the last 168 hours.   DDimer No results for input(s): DDIMER in the last 168 hours.   Radiology    Ct Angio Head W Or Wo Contrast  Result Date: 05/15/2018 CLINICAL DATA:  Expressive aphasia. Clinical concern for left MCA LVO. EXAM: CT ANGIOGRAPHY HEAD AND NECK TECHNIQUE: Multidetector CT imaging of the head and neck was performed using the standard protocol during bolus administration of intravenous contrast. Multiplanar CT image reconstructions and MIPs were obtained to evaluate the vascular anatomy. Carotid stenosis measurements (when applicable) are obtained utilizing NASCET criteria, using the distal internal carotid diameter as the denominator. CONTRAST:  45m ISOVUE-370 IOPAMIDOL (ISOVUE-370) INJECTION 76% COMPARISON:  Head and neck CTA 04/11/2011  FINDINGS: CTA NECK FINDINGS Aortic arch: Standard 3 vessel aortic arch, incompletely imaged. Widely patent arch vessel origins. Calcified plaque at the right subclavian artery origin and in the proximal left subclavian artery without significant stenosis. Right carotid system: Calcified plaque about the right carotid bifurcation with severe motion artifact through this region precluding assessment for stenosis. Wide patency of the common carotid artery more proximally. Patent proximal cervical ICA. Obscuration of the more distal cervical ICA due to motion. Left carotid system: Calcified plaque about the right carotid bifurcation with severe motion artifact through this region precluding assessment for stenosis. Wide patency of the common carotid artery more proximally. Patent proximal and mid cervical ICA. Obscuration of the distal most cervical ICA due to motion. Vertebral arteries: Both vertebral arteries are patent proximally without evidence of significant origin stenosis, however the V2 segments are largely obscured by motion artifact. The V3 segments are grossly patent. Skeleton: Focally advanced disc degeneration at C5-6 with uncovertebral spurring resulting in asymmetric right neural foraminal stenosis. Other neck: Diminutive thyroid. Upper chest: No apical lung consolidation. Review of the MIP images confirms the above findings CTA HEAD FINDINGS Anterior circulation: The intracranial internal carotid arteries are grossly patent but poorly evaluated due to motion artifact. The A1 and M1 segments are grossly patent bilaterally. Branch vessel evaluation is limited by severe motion artifact. The left M2 inferior division is patent proximally, however the left M2 superior division trunk (a patent and sizable vessel on the 2012 CTA) is not clearly seen which is concerning for occlusion in this clinical scenario. It is worth noting however that motion artifact also results in poor visualization of right MCA branch  vessels as well. No large aneurysm is identified. Posterior circulation: The intracranial vertebral arteries are grossly patent to the basilar. The basilar artery is obscured proximally by motion artifact but appears widely patent more distally. The PCAs are patent proximally. No large aneurysm is identified. Venous sinuses: Grossly patent where adequately visualized given arterial phase contrast timing and motion artifact. Anatomic variants: None. Review of the MIP images confirms the above findings IMPRESSION: 1. Severely motion degraded examination. 2. Findings suspicious for proximal left M2 occlusion. 3. No evidence of large vessel occlusion more proximally. 4. Calcified plaque at the carotid bifurcations in the neck. Nondiagnostic evaluation for stenosis due to motion. The study was reviewed in person with Dr. KLeonel Ramsayon 05/15/2018 at 6:35 p.m. Electronically Signed   By: ASeymour BarsD.  On: 05/15/2018 19:00   Ct Angio Neck W Or Wo Contrast  Result Date: 05/15/2018 CLINICAL DATA:  Expressive aphasia. Clinical concern for left MCA LVO. EXAM: CT ANGIOGRAPHY HEAD AND NECK TECHNIQUE: Multidetector CT imaging of the head and neck was performed using the standard protocol during bolus administration of intravenous contrast. Multiplanar CT image reconstructions and MIPs were obtained to evaluate the vascular anatomy. Carotid stenosis measurements (when applicable) are obtained utilizing NASCET criteria, using the distal internal carotid diameter as the denominator. CONTRAST:  45m ISOVUE-370 IOPAMIDOL (ISOVUE-370) INJECTION 76% COMPARISON:  Head and neck CTA 04/11/2011 FINDINGS: CTA NECK FINDINGS Aortic arch: Standard 3 vessel aortic arch, incompletely imaged. Widely patent arch vessel origins. Calcified plaque at the right subclavian artery origin and in the proximal left subclavian artery without significant stenosis. Right carotid system: Calcified plaque about the right carotid bifurcation with  severe motion artifact through this region precluding assessment for stenosis. Wide patency of the common carotid artery more proximally. Patent proximal cervical ICA. Obscuration of the more distal cervical ICA due to motion. Left carotid system: Calcified plaque about the right carotid bifurcation with severe motion artifact through this region precluding assessment for stenosis. Wide patency of the common carotid artery more proximally. Patent proximal and mid cervical ICA. Obscuration of the distal most cervical ICA due to motion. Vertebral arteries: Both vertebral arteries are patent proximally without evidence of significant origin stenosis, however the V2 segments are largely obscured by motion artifact. The V3 segments are grossly patent. Skeleton: Focally advanced disc degeneration at C5-6 with uncovertebral spurring resulting in asymmetric right neural foraminal stenosis. Other neck: Diminutive thyroid. Upper chest: No apical lung consolidation. Review of the MIP images confirms the above findings CTA HEAD FINDINGS Anterior circulation: The intracranial internal carotid arteries are grossly patent but poorly evaluated due to motion artifact. The A1 and M1 segments are grossly patent bilaterally. Branch vessel evaluation is limited by severe motion artifact. The left M2 inferior division is patent proximally, however the left M2 superior division trunk (a patent and sizable vessel on the 2012 CTA) is not clearly seen which is concerning for occlusion in this clinical scenario. It is worth noting however that motion artifact also results in poor visualization of right MCA branch vessels as well. No large aneurysm is identified. Posterior circulation: The intracranial vertebral arteries are grossly patent to the basilar. The basilar artery is obscured proximally by motion artifact but appears widely patent more distally. The PCAs are patent proximally. No large aneurysm is identified. Venous sinuses: Grossly  patent where adequately visualized given arterial phase contrast timing and motion artifact. Anatomic variants: None. Review of the MIP images confirms the above findings IMPRESSION: 1. Severely motion degraded examination. 2. Findings suspicious for proximal left M2 occlusion. 3. No evidence of large vessel occlusion more proximally. 4. Calcified plaque at the carotid bifurcations in the neck. Nondiagnostic evaluation for stenosis due to motion. The study was reviewed in person with Dr. KLeonel Ramsayon 05/15/2018 at 6:35 p.m. Electronically Signed   By: ALogan BoresM.D.   On: 05/15/2018 19:00   Mr Brain Wo Contrast  Result Date: 05/16/2018 CLINICAL DATA:  Sudden onset aphasia yesterday following cardiac catheterization. History of atrial fibrillation. EXAM: MRI HEAD WITHOUT CONTRAST TECHNIQUE: Multiplanar, multiecho pulse sequences of the brain and surrounding structures were obtained without intravenous contrast. COMPARISON:  Head CT and CTA 05/15/2018.  Brain MRI 05/13/2018. FINDINGS: Brain: There is a 9 mm acute cortical/subcortical infarct in the right frontal lobe near the  anterior aspect of the centrum semiovale. There is also a 2 mm focus of diffusion signal abnormality in the inferomedial left cerebellum without clearly reduced ADC. A chronic microhemorrhage is again noted in the left putamen. There is also a chronic microhemorrhage in the left frontal white matter. The ventricles and sulci are normal. Patchy to confluent T2 hyperintensities in the cerebral white matter and pons are unchanged from the prior MRI and nonspecific but compatible with severe chronic small vessel ischemic disease. No mass, midline shift, or extra-axial fluid collection is identified. Vascular: Major intracranial vascular flow voids are preserved. Skull and upper cervical spine: Unremarkable bone marrow signal. Sinuses/Orbits: Bilateral cataract extraction. Clear paranasal sinuses. Trace left mastoid effusion. Other: None.  IMPRESSION: 1. Small acute right frontal lobe cortical/subcortical infarct. 2. Punctate acute or early subacute left cerebellar infarct. 3. Severe chronic small vessel ischemic disease. Electronically Signed   By: Logan Bores M.D.   On: 05/16/2018 19:22   Dg Chest Port 1 View  Result Date: 05/17/2018 CLINICAL DATA:  77 year old female with history of trauma from a fall. EXAM: PORTABLE CHEST 1 VIEW COMPARISON:  Chest x-ray 05/15/2018. FINDINGS: An endotracheal tube is in place with tip 4.5 cm above the carina. Nasogastric tube extending into the stomach with side port just below the level of the gastroesophageal junction. Lung volumes are low. Widespread interstitial prominence and patchy multifocal airspace disease, most evident in the left lower lobe, concerning for multilobar bronchopneumonia. Small left pleural effusion. Mild cardiomegaly. Upper mediastinal contours are within normal limits. Aortic atherosclerosis. Surgical clips in the right axillary region suggesting prior lymph node dissection. IMPRESSION: 1. Support apparatus, as above. 2. Patchy multifocal interstitial and airspace disease, most confluent in the left lower lobe, concerning for multilobar bronchopneumonia. Aeration has slightly improved compared to the prior examination. 3. Small left pleural effusion. Electronically Signed   By: Vinnie Langton M.D.   On: 05/17/2018 09:53   Dg Chest Port 1 View  Result Date: 05/15/2018 CLINICAL DATA:  Stroke protocol, pt intubated EXAM: PORTABLE CHEST 1 VIEW COMPARISON:  05/12/2018 FINDINGS: Endotracheal tube is in place, tip approximately 2.5 centimeters above the carina. Heart is enlarged. There is patchy infiltrate in the LEFT lung base and increased compared to prior study. Mildly prominent interstitial markings appear stable. Surgical clips are identified in the RIGHT axillary region. IMPRESSION: Cardiomegaly. LEFT LOWER lobe infiltrate. Electronically Signed   By: Nolon Nations M.D.    On: 05/15/2018 21:17   Dg Abd Portable 1v  Result Date: 05/16/2018 CLINICAL DATA:  Encounter for orogastric tube placement. Hiatal hernia based on previous abdominal CT. EXAM: PORTABLE ABDOMEN - 1 VIEW COMPARISON:  CT 01/15/2018 FINDINGS: Orogastric tube has been placed. The tip in the mid upper abdomen likely in the stomach body region. There is gas in the stomach and gas in the visualized colon. Few patchy densities in the left lower lobe may represent atelectasis but nonspecific. IMPRESSION: Orogastric tube in the upper abdomen and likely in the gastric body based on the known hiatal hernia. Electronically Signed   By: Markus Daft M.D.   On: 05/16/2018 12:20   Ct Head Code Stroke Wo Contrast  Result Date: 05/15/2018 CLINICAL DATA:  Code stroke.  Expressive aphasia. EXAM: CT HEAD WITHOUT CONTRAST TECHNIQUE: Contiguous axial images were obtained from the base of the skull through the vertex without intravenous contrast. COMPARISON:  05/13/2018 brain MRI and CT FINDINGS: Brain: There is no evidence of acute infarct, intracranial hemorrhage, mass, midline shift, or extra-axial  fluid collection. The ventricles and sulci are normal. Patchy to confluent cerebral white matter hypodensities are unchanged and nonspecific but compatible with severe chronic small vessel ischemic disease. Vascular: Calcified atherosclerosis at the skull base. Residual vascular contrast from coronary catheterization earlier today Skull: No fracture or focal osseous lesion. Sinuses/Orbits: Paranasal sinuses and mastoid air cells are clear. Bilateral cataract extraction. Other: None. ASPECTS [facility] Stroke Program Early CT Score) - Ganglionic level infarction (caudate, lentiform nuclei, internal capsule, insula, M1-M3 cortex): 7 - Supraganglionic infarction (M4-M6 cortex): 3 Total score (0-10 with 10 being normal): 10 IMPRESSION: 1. No evidence of acute intracranial abnormality. 2. ASPECTS is 10. 3. Severe chronic small vessel ischemic  disease. These results were communicated to Dr. Leonel Ramsay at 6:02 pm on 05/15/2018 by text page via the [facility] messaging system. Electronically Signed   By: Logan Bores M.D.   On: 05/15/2018 18:02    Cardiac Studies    Echo:    Study Conclusions  - Left ventricle: The cavity size was mildly dilated. Wall   thickness was increased in a pattern of mild LVH. Systolic   function was severely reduced. The estimated ejection fraction   was in the range of 20% to 25%. There is akinesis of the   apicalanteroseptal, anterior, and inferior myocardium. Doppler   parameters are consistent with abnormal left ventricular   relaxation (grade 1 diastolic dysfunction). Doppler parameters   are consistent with high ventricular filling pressure. - Aortic valve: There was mild regurgitation. - Left atrium: The atrium was mildly dilated. - Right ventricle: The cavity size was mildly dilated. Systolic   function was mildly to moderately reduced. - Pulmonary arteries: PA peak pressure: 45 mm Hg (S). - Pericardium, extracardiac: A trivial pericardial effusion was   identified posterior to the heart.  Cardiac cath:    1. No angiographic evidence of CAD 2. Non-ischemic cardiomyopathy, possible stress induced cardiomyopathy (Takotsubo's cardiomyopathy).    Patient Profile     77 y.o. female with a hx of hypertension, hyperlipidemia, diabetes mellitus, hypothyroidism, depression,Paroxysmalatrial fibrillation on Coumadin, bradycardia, chronic anemia, breast cancer (right mastectomy)and memory issuewhopresented after mechanical fall and seenfor the evaluation of NSTEMIat the request of Dr. Blaine Hamper.  Found to have NSTEMI and found to have non ischemic CM on LHC.  Developed aphasia after [place] an[place] neurology consulted, and underwent cerebral arteriogram.  Required intubation for procedure.  Assessment & Plan    Non ischemic cardiomyopathy:  Echo as above.  She has decreased UO.  I will give a dose of IV  Lasix.  I am going to hold off on ARB as her creat is up slightly and BP down.    CVA:  Probably an embolic source.  Plan to start Eliquis once she has passed a swallowing study.      Atrial fib:   NSR with intermittent atrial fib..  As above.      For questions or updates, please contact [place] Please consult [email or web address] for contact info under Cardiology/STEMI.   Signed, Minus Breeding, MD  05/17/2018, 11:54 AM

## 2018-05-17 NOTE — Progress Notes (Signed)
STROKE TEAM PROGRESS NOTE   SUBJECTIVE (INTERVAL HISTORY) Her daughters and granddaughter are at the bedside.  Pt was extubated this morning. Still drowsy and sleepy during round, able to tell me her name but did not answer other questions. MRI showed punctate left cerebellar and right frontal infarcts, not explaining her initial presentation. Hb continues to drop, will need anemia work up.    OBJECTIVE Vitals:   05/17/18 0400 05/17/18 0500 05/17/18 0600 05/17/18 0700  BP: (!) 151/57 (!) 109/45 (!) 119/41 (!) 112/51  Pulse: 68 (!) 49 (!) 47 (!) 48  Resp: 16 16 16 16   Temp:      TempSrc:      SpO2: 100% 98% 98% 99%  Weight:      Height:        CBC:  Recent Labs  Lab 05/12/18 2304  05/16/18 0425 05/17/18 0532  WBC 7.5   < > 7.3 9.4  NEUTROABS 5.6  --   --   --   HGB 10.2*   < > 8.6* 7.9*  HCT 32.7*   < > 28.1* 25.8*  MCV 91.1   < > 89.8 91.2  PLT 277   < > 246 216   < > = values in this interval not displayed.    Basic Metabolic Panel:  Recent Labs  Lab 05/16/18 0425 05/17/18 0532  NA 139 139  K 3.8 3.7  CL 107 109  CO2 22 22  GLUCOSE 117* 123*  BUN 19 21  CREATININE 1.28* 1.30*  CALCIUM 8.5* 8.4*    Lipid Panel:     Component Value Date/Time   CHOL 88 05/16/2018 0425   TRIG 77 05/16/2018 0425   HDL 38 (L) 05/16/2018 0425   CHOLHDL 2.3 05/16/2018 0425   VLDL 15 05/16/2018 0425   LDLCALC 35 05/16/2018 0425   HgbA1c:  Lab Results  Component Value Date   HGBA1C 5.6 05/16/2018   Urine Drug Screen: No results found for: LABOPIA, COCAINSCRNUR, LABBENZ, AMPHETMU, THCU, LABBARB  Alcohol Level No results found for: ETH  IMAGING   Ct Angio Head W Or Wo Contrast Ct Angio Neck W Or Wo Contrast 05/15/2018 IMPRESSION:  1. Severely motion degraded examination.  2. Findings suspicious for proximal left M2 occlusion.  3. No evidence of large vessel occlusion more proximally.  4. Calcified plaque at the carotid bifurcations in the neck. Nondiagnostic  evaluation for stenosis due to motion.    Dg Chest Port 1 View 05/15/2018 IMPRESSION:  Cardiomegaly. LEFT LOWER lobe infiltrate.    Ct Head Code Stroke Wo Contrast 05/15/2018 IMPRESSION:  1. No evidence of acute intracranial abnormality.  2. ASPECTS is 10.  3. Severe chronic small vessel ischemic disease.    Mr Brain Wo Contrast Result Date: 05/16/2018 CLINICAL DATA:  Sudden onset aphasia yesterday following cardiac catheterization. History of atrial fibrillation. EXAM: MRI HEAD WITHOUT CONTRAST TECHNIQUE: Multiplanar, multiecho pulse sequences of the brain and surrounding structures were obtained without intravenous contrast. COMPARISON:  Head CT and CTA 05/15/2018.  Brain MRI 05/13/2018. FINDINGS: Brain: There is a 9 mm acute cortical/subcortical infarct in the right frontal lobe near the anterior aspect of the centrum semiovale. There is also a 2 mm focus of diffusion signal abnormality in the inferomedial left cerebellum without clearly reduced ADC. A chronic microhemorrhage is again noted in the left putamen. There is also a chronic microhemorrhage in the left frontal white matter. The ventricles and sulci are normal. Patchy to confluent T2 hyperintensities in the cerebral white  matter and pons are unchanged from the prior MRI and nonspecific but compatible with severe chronic small vessel ischemic disease. No mass, midline shift, or extra-axial fluid collection is identified. Vascular: Major intracranial vascular flow voids are preserved. Skull and upper cervical spine: Unremarkable bone marrow signal. Sinuses/Orbits: Bilateral cataract extraction. Clear paranasal sinuses. Trace left mastoid effusion. Other: None. IMPRESSION: 1. Small acute right frontal lobe cortical/subcortical infarct. 2. Punctate acute or early subacute left cerebellar infarct. 3. Severe chronic small vessel ischemic disease. Electronically Signed   By: Logan Bores M.D.   On: 05/16/2018 19:22    Transthoracic  Echocardiogram  05/13/2018 Study Conclusion - Left ventricle: The cavity size was mildly dilated. Wall   thickness was increased in a pattern of mild LVH. Systolic   function was severely reduced. The estimated ejection fraction   was in the range of 20% to 25%. There is akinesis of the   apicalanteroseptal, anterior, and inferior myocardium. Doppler   parameters are consistent with abnormal left ventricular   relaxation (grade 1 diastolic dysfunction). Doppler parameters   are consistent with high ventricular filling pressure. - Aortic valve: There was mild regurgitation. - Left atrium: The atrium was mildly dilated. - Right ventricle: The cavity size was mildly dilated. Systolic   function was mildly to moderately reduced. - Pulmonary arteries: PA peak pressure: 45 mm Hg (S). - Pericardium, extracardiac: A trivial pericardial effusion was   identified posterior to the heart.   IR 4 vessel cerebral arteriogram. RT CFA approach. Findings. 1. Angiographically no evidence of occlusions,stenosis,intraluminal filling defects,dissections or of AV shunting. 2.Venous outflow WNLs.    PHYSICAL EXAM Blood pressure (!) 112/51, pulse (!) 48, temperature 99.9 F (37.7 C), temperature source Axillary, resp. rate 16, height 5\' 7"  (1.702 m), weight 95.9 kg, SpO2 99 %.  General - Well nourished, well developed, sleepy drowsy.  Ophthalmologic - fundi not visualized due to noncooperation.  Cardiovascular - bradycardia with frequent PACs on tele  Neuro - sleepy drowsy, able to open eyes to voice but not able to maintain wakefulness without stimulation. Eyes able to attend to both directions when open. Inconsistent with visual threat testing. PERRL. Facial symmetric. Tongue midline in mouth. BUE 2+/5 on testing. BLE 2/5 on pain stimulation. DTR 1+ and babinski not cooperative. Sensation, coordination and gait not tested.   ASSESSMENT/PLAN [name] is a 77 y.o. female with history  of A. Fib (coumadin), HTN, HLD, breast cancer, NSTEMI 05/13/2018.  presenting with speech difficulties after cardiac cath. She did not receive IV t-PA due to heparin already administered.  Stroke - left cerebellar and right frontal small infarcts, embolic in nature, could be due to A. fib off Coumadin  CT head - No evidence of acute intracranial abnormality.   MRI head - left cerebellar and right frontal small infarcts, not explaining presenting symptoms  CTA H&N - Severely motion degraded examination. Findings suspicious for proximal left M2 occlusion.   Cerebral Angiogram - no evidence of occlusions or stenosis.  2D Echo - 05/13/2018 - EF 20% to 25%, concerning for Takotsubo cardiomyopathy  LDL - 35  HgbA1c 5.6  VTE prophylaxis - SCDs  Diet - NPO  INR 1.41  warfarin daily prior to admission, now on aspirin 300 mg suppository daily or ASA 325 mg PO daily. Will restart eliquis once pass swallow  Ongoing aggressive stroke risk factor management  Therapy recommendations:  pending  Disposition:  Pending  Non-STEMI with cardiomyopathy  05/13/2018 off Coumadin for cardiac cath  Cardiac cath unremarkable  On aspirin now  EF 20 to 25% now concerning forTakotsubo cardiomyopathy  Cardiology on board  Gentle hydration  On Coreg  A. fib, chronic  On Coumadin PTA  INR 2.69-2.43-1.57-1.47-1.41  Post cath  EF 20 to 25%  Will resume anticoagulation if pass swallow  Hypertension  Stable  On Coreg  Hold off hydralazine at this time . BP goal normotensive  Hyperlipidemia  Lipid lowering medication PTA: Pravachol 80 mg daily  LDL 35, goal < 70  Current lipid lowering medication: Pravastatin 80 mg daily  Continue statin at discharge  Other Stroke Risk Factors  Advanced age  Obesity, Body mass index is 33.11 kg/m., recommend weight loss, diet and exercise as appropriate   Anemia   Hb 9.2->8.6->7.9  Will check stool occult blood  Check anemia  panel   Close H&H monitoring  Dysphagia   Have not passed swallow  Speech on board  NPO for now  Continue IVF  Other Active Problems  Anemia due to chronic disease -   Mildly elevated creatinine 1.25->1.28->1.30 - on IVF  Hospital day # 4  This patient is critically ill due to suspected stroke, CHF, non-STEMI, A. fib and at significant risk of neurological worsening, death form recurrent stroke, hemorrhagic conversion, heart failure, seizure. This patient's care requires constant monitoring of vital signs, hemodynamics, respiratory and cardiac monitoring, review of multiple databases, neurological assessment, discussion with family, other specialists and medical decision making of high complexity. I spent 35 minutes of neurocritical care time in the care of this patient.  I had long discussion with daughgters at bedside, updated pt current condition, treatment plan and potential prognosis. They expressed understanding and appreciation.   Gloria Hawking, MD PhD Stroke Neurology 05/17/2018 11:26 AM    To contact Stroke Continuity provider, please refer to [email or web address] After hours, contact General Neurology

## 2018-05-17 NOTE — Progress Notes (Signed)
[facility] Progress Note Patient Name: Gloria Joseph DOB: 1940/08/09 MRN: [id number]   Date of Service  05/17/2018  HPI/Events of Note  Notified of need for stress ulcer prophylaxis. Patient with history of prolonged QTc interval. Therefore, can't use Protonix or Pepcid.   eICU Interventions  Will order: 1. Carafate 1 gm per tube now and Q 6 hours.      Intervention Category Intermediate Interventions: Best-practice therapies (e.g. DVT, beta blocker, etc.)  Sherill Wegener [place] 05/17/2018, 6:58 AM

## 2018-05-17 NOTE — Progress Notes (Signed)
NAME:  Gloria Joseph, MRN:  [id number], DOB:  September 06, 1940, LOS: 4 ADMISSION DATE:  05/12/2018, CONSULTATION DATE:  05/15/18 REFERRING MD: Dr. Erlinda Hong, Neurology CHIEF COMPLAINT:  Ventilation management   Brief History   77 yo female fell at [place] and hit her head.  Later developed nausea and vomiting.  Found to have NSTEMI and found to have non ischemic CM on LHC.  Developed aphasia after [place] [facility] neurology consulted, and underwent cerebral arteriogram.  Required intubation for procedure.  Past Medical History  HTN, HLD, DM, Hypothyroidism, Depression, A fib on coumadin  Significant Hospital Events   12/24 Admit 12/25 cardiology consulted 12/27 LHC, aphasia, cerebral arteriogram  Consults:  Cardiology Neurology  Neuro IR  Procedures:  ETT 12/27 >>   Significant Diagnostic Tests:  Echo 12/25 >> EF 20 to 25%, grade 1 DD LHC 12/27 >> non ischemic CM (Takotsubo) Cerebral arteriogram 12/27 >> no evidence of occlusion MRI brain 12/28 >> small acute Rt frontal infarct, punctate acute/subacute Lt cerebellar infarct, chronic small vessel ischemic disease  Micro Data:    Antimicrobials:     Interim history/subjective:  Remains on full support, sedation.  Objective   Blood pressure (!) 112/51, pulse (!) 48, temperature 99.9 F (37.7 C), temperature source Axillary, resp. rate 16, height 5\' 7"  (1.702 m), weight 95.9 kg, SpO2 99 %.    Vent Mode: PRVC FiO2 (%):  [30 %-40 %] 30 % Set Rate:  [16 bmp] 16 bmp Vt Set:  [500 mL] 500 mL PEEP:  [5 cmH20] 5 cmH20 Delta P (Amplitude):  [19] 19 Pressure Support:  [14 cmH20] 14 cmH20 Plateau Pressure:  [18 cmH20-23 cmH20] 20 cmH20   Intake/Output Summary (Last 24 hours) at 05/17/2018 0755 Last data filed at 05/17/2018 0700 Gross per 24 hour  Intake 1618.96 ml  Output 575 ml  Net 1043.96 ml   Filed Weights   05/15/18 0300 05/15/18 2040 05/16/18 1551  Weight: 93.4 kg 94.9 kg 95.9 kg    Examination:  General - sedated Eyes -  pupils reactive ENT - ETT in place Cardiac - regular rate/rhythm, no murmur Chest - scattered rhonchi Abdomen - soft, non tender, + bowel sounds Extremities - no cyanosis, clubbing, or edema Skin - no rashes Neuro - RASS -1  CXR 12/29 (reviewed by me) >> CM, areas of atx, mild interstitial edema   Resolved Hospital Problem list     Assessment & Plan:   Compromised airway. Plan - pressure support trials - f/u CXR intermittently  Aphasia with acute Rt frontal and Lt cerebellar infarcts. Plan - per neurology - RASS goal 0 to -1 while on vent - continue ASA  Hypertension, NSTEMI, non ischemic CM, A fib, HLD. Plan - goal SBP 110 to 180 - continue coreg, pravachol - anticoagulation per cardiology  DM type II. Hypothyroidism. Plan - SSI - synthroid  Anemia of chronic disease. Plan - f/u CBC - check iron levels  Oliguria. Plan - continue IV fluids   Best practice:  DVT prophylaxis: SCDs SUP: carafate Nutrition: NPO; tube feeds if not able to extubate soon Goals of care: full code Family discussions: no family at bedside  Labs    CMP Latest Ref Rng & Units 05/17/2018 05/16/2018 05/15/2018  Glucose 70 - 99 mg/dL 123(H) 117(H) -  BUN 8 - 23 mg/dL 21 19 -  Creatinine 0.44 - 1.00 mg/dL 1.30(H) 1.28(H) -  Sodium 135 - 145 mmol/L 139 139 137  Potassium 3.5 - 5.1 mmol/L 3.7 3.8 4.1  Chloride 98 - 111 mmol/L 109 107 -  CO2 22 - 32 mmol/L 22 22 -  Calcium 8.9 - 10.3 mg/dL 8.4(L) 8.5(L) -  Total Protein 6.0 - 8.3 g/dL - - -  Total Bilirubin 0.2 - 1.2 mg/dL - - -  Alkaline Phos 39 - 117 U/L - - -  AST 0 - 37 U/L - - -  ALT 0 - 35 U/L - - -   CBC Latest Ref Rng & Units 05/17/2018 05/16/2018 05/15/2018  WBC 4.0 - 10.5 K/uL 9.4 7.3 -  Hemoglobin 12.0 - 15.0 g/dL 7.9(L) 8.6(L) 9.2(L)  Hematocrit 36.0 - 46.0 % 25.8(L) 28.1(L) 27.0(L)  Platelets 150 - 400 K/uL 216 246 -   ABG    Component Value Date/Time   PHART 7.474 (H) 05/15/2018 2140   PCO2ART 33.7  05/15/2018 2140   PO2ART 80.0 (L) 05/15/2018 2140   HCO3 25.0 05/15/2018 2140   TCO2 26 05/15/2018 2140   O2SAT 97.0 05/15/2018 2140   CBG (last 3)  Recent Labs    05/16/18 1922 05/16/18 2324 05/17/18 0321  GLUCAP 98 97 102*   CC time 31 minutes  Chesley Mires, MD [facility] Pulmonary/Critical Care 05/17/2018, 7:55 AM

## 2018-05-17 NOTE — Procedures (Signed)
Extubation Procedure Note  Patient Details:   Name: Gloria Joseph DOB: 03/22/1941 MRN: [id number]   Airway Documentation:    Vent end date: 05/17/18 Vent end time: 0904   Evaluation  O2 sats: stable throughout Complications: No apparent complications Patient did tolerate procedure well. Bilateral Breath Sounds: Clear   Yes: patient was able to breathe around the cuff prior to extubation and speak post extubation.   Acquanetta Belling 05/17/2018, 9:04 AM

## 2018-05-17 NOTE — Anesthesia Postprocedure Evaluation (Signed)
Anesthesia Post Note  Patient: Gloria Joseph  Procedure(s) Performed: IR WITH ANESTHESIA (N/A )     Patient location during evaluation: SICU Anesthesia Type: General Level of consciousness: sedated and patient remains intubated per anesthesia plan Pain management: pain level controlled Vital Signs Assessment: post-procedure vital signs reviewed and stable Respiratory status: patient remains intubated per anesthesia plan Cardiovascular status: stable Anesthetic complications: no    Last Vitals:  Vitals:   05/17/18 1100 05/17/18 1543  BP: 129/68   Pulse: 68 96  Resp: 12 (!) 21  Temp:    SpO2: 96% 95%    Last Pain:  Vitals:   05/17/18 0357  TempSrc: Axillary  PainSc:                  Nolon Nations

## 2018-05-18 ENCOUNTER — Encounter (HOSPITAL_COMMUNITY): Payer: Self-pay | Admitting: Interventional Radiology

## 2018-05-18 DIAGNOSIS — D72829 Elevated white blood cell count, unspecified: Secondary | ICD-10-CM | POA: Diagnosis present

## 2018-05-18 DIAGNOSIS — I6340 Cerebral infarction due to embolism of unspecified cerebral artery: Secondary | ICD-10-CM

## 2018-05-18 DIAGNOSIS — Z853 Personal history of malignant neoplasm of breast: Secondary | ICD-10-CM

## 2018-05-18 DIAGNOSIS — I5189 Other ill-defined heart diseases: Secondary | ICD-10-CM

## 2018-05-18 LAB — BASIC METABOLIC PANEL
Anion gap: 12 (ref 5–15)
BUN: 19 mg/dL (ref 8–23)
CHLORIDE: 107 mmol/L (ref 98–111)
CO2: 20 mmol/L — ABNORMAL LOW (ref 22–32)
Calcium: 8.5 mg/dL — ABNORMAL LOW (ref 8.9–10.3)
Creatinine, Ser: 1.14 mg/dL — ABNORMAL HIGH (ref 0.44–1.00)
GFR calc Af Amer: 53 mL/min — ABNORMAL LOW (ref >60)
GFR calc non Af Amer: 46 mL/min — ABNORMAL LOW (ref >60)
Glucose, Bld: 133 mg/dL — ABNORMAL HIGH (ref 70–99)
Potassium: 3.6 mmol/L (ref 3.5–5.1)
Sodium: 139 mmol/L (ref 135–145)

## 2018-05-18 LAB — GLUCOSE, CAPILLARY
Glucose-Capillary: 100 mg/dL — ABNORMAL HIGH (ref 70–99)
Glucose-Capillary: 109 mg/dL — ABNORMAL HIGH (ref 70–99)
Glucose-Capillary: 124 mg/dL — ABNORMAL HIGH (ref 70–99)
Glucose-Capillary: 91 mg/dL (ref 70–99)
Glucose-Capillary: 110 mg/dL — ABNORMAL HIGH (ref 70–99)
Glucose-Capillary: 104 mg/dL — ABNORMAL HIGH (ref 70–99)
Glucose-Capillary: 138 mg/dL — ABNORMAL HIGH (ref 70–99)

## 2018-05-18 LAB — IRON AND TIBC
Iron: 10 ug/dL — ABNORMAL LOW (ref 28–170)
Saturation Ratios: 3 % — ABNORMAL LOW (ref 10.4–31.8)
TIBC: 295 ug/dL (ref 250–450)
UIBC: 285 ug/dL

## 2018-05-18 LAB — FERRITIN: Ferritin: 50 ng/mL (ref 11–307)

## 2018-05-18 LAB — CBC
HCT: 27.7 % — ABNORMAL LOW (ref 36.0–46.0)
HEMOGLOBIN: 8.4 g/dL — AB (ref 12.0–15.0)
MCH: 27.7 pg (ref 26.0–34.0)
MCHC: 30.3 g/dL (ref 30.0–36.0)
MCV: 91.4 fL (ref 80.0–100.0)
Platelets: 210 10*3/uL (ref 150–400)
RBC: 3.03 MIL/uL — AB (ref 3.87–5.11)
RDW: 15.1 % (ref 11.5–15.5)
WBC: 11.2 10*3/uL — ABNORMAL HIGH (ref 4.0–10.5)
nRBC: 0 % (ref 0.0–0.2)

## 2018-05-18 MED ORDER — ASPIRIN EC 325 MG PO TBEC
325.0000 mg | DELAYED_RELEASE_TABLET | Freq: Every day | ORAL | Status: DC
  Administered 2018-05-18: 325 mg via ORAL
  Filled 2018-05-18: qty 1

## 2018-05-18 MED ORDER — LOSARTAN POTASSIUM 50 MG PO TABS
100.0000 mg | ORAL_TABLET | Freq: Every day | ORAL | Status: DC
  Administered 2018-05-18 – 2018-05-22 (×5): 100 mg via ORAL
  Filled 2018-05-18 (×5): qty 2

## 2018-05-18 MED ORDER — APIXABAN 5 MG PO TABS
5.0000 mg | ORAL_TABLET | Freq: Two times a day (BID) | ORAL | Status: DC
  Administered 2018-05-18 – 2018-05-22 (×9): 5 mg via ORAL
  Filled 2018-05-18 (×9): qty 1

## 2018-05-18 MED ORDER — ASPIRIN 300 MG RE SUPP: 300.0000 mg | Freq: Every day | RECTAL | Status: DC

## 2018-05-18 MED ORDER — POTASSIUM CHLORIDE CRYS ER 20 MEQ PO TBCR
40.0000 meq | EXTENDED_RELEASE_TABLET | Freq: Once | ORAL | Status: AC
  Administered 2018-05-18: 40 meq via ORAL
  Filled 2018-05-18: qty 2

## 2018-05-18 MED ORDER — METFORMIN HCL 500 MG PO TABS
500.0000 mg | ORAL_TABLET | Freq: Two times a day (BID) | ORAL | Status: DC
  Administered 2018-05-18 – 2018-05-22 (×9): 500 mg via ORAL
  Filled 2018-05-18 (×9): qty 1

## 2018-05-18 MED ORDER — INSULIN ASPART 100 UNIT/ML ~~LOC~~ SOLN
0.0000 [IU] | Freq: Three times a day (TID) | SUBCUTANEOUS | Status: DC
  Administered 2018-05-19 – 2018-05-20 (×2): 2 [IU] via SUBCUTANEOUS
  Administered 2018-05-21: 3 [IU] via SUBCUTANEOUS
  Administered 2018-05-22: 2 [IU] via SUBCUTANEOUS

## 2018-05-18 MED ORDER — FUROSEMIDE 10 MG/ML IJ SOLN
20.0000 mg | Freq: Once | INTRAMUSCULAR | Status: AC
  Administered 2018-05-18: 20 mg via INTRAVENOUS
  Filled 2018-05-18: qty 2

## 2018-05-18 MED ORDER — FERROUS SULFATE 325 (65 FE) MG PO TABS
325.0000 mg | ORAL_TABLET | Freq: Two times a day (BID) | ORAL | Status: DC
  Administered 2018-05-18 – 2018-05-21 (×7): 325 mg via ORAL
  Filled 2018-05-18 (×8): qty 1

## 2018-05-18 NOTE — Progress Notes (Signed)
Rehab Admissions Coordinator Note:  Patient was screened by Cleatrice Burke for appropriateness for an Inpatient Acute Rehab Consult per PT recs.  At this time, we are recommending Inpatient Rehab consult. Please place order for consult when felt appropriate.  [place], [place] 05/18/2018, 8:29 AM  I can be reached at [phone].

## 2018-05-18 NOTE — Evaluation (Signed)
Clinical/Bedside Swallow Evaluation Patient Details  Name: AMAURI MEDELLIN MRN: [id number] Date of Birth: 06-28-1940  Today's Date: 05/18/2018 Time: SLP Start Time (ACUTE ONLY): 0910 SLP Stop Time (ACUTE ONLY): 0925 SLP Time Calculation (min) (ACUTE ONLY): 15 min  Past Medical History:  Past Medical History:  Diagnosis Date  . Anemia 12/18/2011  . Atrial fibrillation ([place]) 04/13/2011  . Cancer [facility])    breast CA s/p Right mastectomy  . Diabetes mellitus, type 2 (HCC)    diet controlled  . Hx of echocardiogram    Echocardiogram (12/15): Moderate LVH, EF 55-60%, normal wall motion, grade 1 diastolic dysfunction, aortic sclerosis without stenosis, mild AI, MAC  . Hyperlipidemia   . Hypertension    medicine refractory  . Hypothyroidism    s/p thryroid gland ablation  . Neuromuscular disorder ([place])   . NSTEMI (non-ST elevated myocardial infarction) ([place]) 05/13/2018  . Osteopenia   . Renal insufficiency, mild 09/23/2013  . Stroke [facility]) 05/15/2018   Past Surgical History:  Past Surgical History:  Procedure Laterality Date  . BREAST ENHANCEMENT SURGERY    . BREAST IMPLANT REMOVAL  2000   Unilaterally   . BREAST SURGERY    . CHOLECYSTECTOMY    . COLONOSCOPY  2002   Neg  . CORONARY ANGIOGRAPHY N/A 05/15/2018   Procedure: CORONARY ANGIOGRAPHY;  Surgeon: Burnell Blanks, MD;  Location: [facility];  Service: Cardiovascular;  Laterality: N/A;  . MASTECTOMY    . RADIOLOGY WITH ANESTHESIA N/A 05/15/2018   Procedure: IR WITH ANESTHESIA;  Surgeon: Luanne Bras, MD;  Location: [facility];  Service: Radiology;  Laterality: N/A;  . RAI  04/04/2006  . TUBAL LIGATION     HPI:  pt is a 77 yo female s/p fall hitting R side of head, CT head negative and MRI brain negative for any acute events. pt with vertigo and PMHx: includes chronic vertigo, R mastectomy, edema, Afib, DMT2, HLD, HTN, HCC, obesity, edema, depression. Pt underwent a cath procedure on 12/27 and then had a  stroke shortly after. MRI revealed L cerebellar and R frontal infarcts. Pt then transfered to 4n and intubated, extubated on 12/29.   Assessment / Plan / Recommendation Clinical Impression  Pt demosntrates normal swallow function with no signs of aspiration with any texture, quantity given. Recommend pt initiate a regular diet and thin liquids. WIll sign off for swallowing and follow for cognition only.       Aspiration Risk  Mild aspiration risk    Diet Recommendation Regular;Thin liquid   Liquid Administration via: Cup;Straw Medication Administration: Whole meds with liquid Supervision: Patient able to self feed Postural Changes: Seated upright at 90 degrees    Other  Recommendations Oral Care Recommendations: Oral care BID   Follow up Recommendations None      Frequency and Duration            Prognosis        Swallow Study   General HPI: pt is a 77 yo female s/p fall hitting R side of head, CT head negative and MRI brain negative for any acute events. pt with vertigo and PMHx: includes chronic vertigo, R mastectomy, edema, Afib, DMT2, HLD, HTN, HCC, obesity, edema, depression. Pt underwent a cath procedure on 12/27 and then had a stroke shortly after. MRI revealed L cerebellar and R frontal infarcts. Pt then transfered to 4n and intubated, extubated on 12/29. Type of Study: Bedside Swallow Evaluation Previous Swallow Assessment: none Diet Prior to this Study: NPO Temperature  Spikes Noted: No Respiratory Status: Room air History of Recent Intubation: Yes Length of Intubations (days): 3 days Date extubated: 05/17/18 Behavior/Cognition: Alert;Cooperative;Confused Oral Cavity Assessment: Within Functional Limits Oral Care Completed by SLP: Yes Oral Cavity - Dentition: Adequate natural dentition Vision: Functional for self-feeding Self-Feeding Abilities: Able to feed self;Needs assist Patient Positioning: Upright in chair Baseline Vocal Quality: Hoarse Volitional  Cough: Strong Volitional Swallow: Able to elicit    Oral/Motor/Sensory Function Overall Oral Motor/Sensory Function: Within functional limits   Ice Chips Ice chips: Not tested   Thin Liquid Thin Liquid: Within functional limits    Nectar Thick Nectar Thick Liquid: Not tested   Honey Thick Honey Thick Liquid: Not tested   Puree Puree: Within functional limits Presentation: Spoon   Solid     Solid: Within functional limits      Ronan Dion, Katherene Ponto 05/18/2018,11:35 AM

## 2018-05-18 NOTE — Evaluation (Signed)
Physical Therapy RE- Evaluation Patient Details Name: Gloria Joseph MRN: [id number] DOB: Mar 21, 1941 Today's Date: 05/18/2018   History of Present Illness  pt is a 77 yo female s/p fall hitting R side of head, CT head negative and MRI brain negative for any acute events. pt with vertigo and PMHx: includes chronic vertigo, R mastectomy, edema, Afib, DMT2, HLD, HTN, HCC, obesity, edema, depression. Pt underwent a cath procedure on 12/27 and then had a stroke shortly after. MRI revealed L cerebellar and R frontal infarcts. Pt then transfered to 4n and intubated, extubated on 12/29.  Clinical Impression  Pt was ambulating 80' with RW with min guard on 12/27 AM. Pt then sustained a stroke s/p cath procedure. Pt now c/o dizziness, nausea, and bilat LE pain requiring mod/maxA for all mobility and ADLs. Pt with altered mentation as well. Pt not oriented to date, demo's impaired processing, sequencing, memory, command follow, and impulsivity. Pt to strongly benefit from aggressive rehab program s/p d/c for maximal functional recovery. Acute PT to follow.    Follow Up Recommendations CIR    Equipment Recommendations  None recommended by PT(TBD at next venue)    Recommendations for Other Services Rehab consult     Precautions / Restrictions Precautions Precautions: Fall Precaution Comments: pt very dizzy due to cerebellar infarct and h/o vertigo Restrictions Weight Bearing Restrictions: No      Mobility  Bed Mobility Overal bed mobility: Needs Assistance Bed Mobility: Sidelying to Sit   Sidelying to sit: Mod assist       General bed mobility comments: max directional verbal cues, pt able to bring LEs off EOB, modA for trunk elevation, pt with dizziness modA required to stabilize patient  Transfers Overall transfer level: Needs assistance Equipment used: Rolling walker (2 wheeled);Standard walker;2 person hand held assist(depending on if any dizziness- supervision  advised) Transfers: Sit to/from [facility] Sit to Stand: Mod assist;+2 physical assistance Stand pivot transfers: Max assist;+2 physical assistance       General transfer comment: pt very unsteady due to dizziness, strong R lateral lean, max directional verbal and tactile cues to complete, pt with c/o nausea as well but no vomitting  Ambulation/Gait             General Gait Details: unable this date  Stairs            Wheelchair Mobility    Modified Rankin (Stroke Patients Only) Modified Rankin (Stroke Patients Only) Pre-Morbid Rankin Score: No significant disability Modified Rankin: Moderately severe disability     Balance Overall balance assessment: Needs assistance Sitting-balance support: Feet supported;Bilateral upper extremity supported Sitting balance-Leahy Scale: Poor Sitting balance - Comments: strong L lateral lean, c/o nausea and dizziness   Standing balance support: Bilateral upper extremity supported Standing balance-Leahy Scale: Poor Standing balance comment: dependent on physical assist due to dizziness and nausa                             Pertinent Vitals/Pain Pain Assessment: Faces Faces Pain Scale: Hurts even more Pain Location: "my legs" Pain Descriptors / Indicators: Grimacing;Moaning Pain Intervention(s): Monitored during session    Home Living Family/patient expects to be discharged to:: Inpatient rehab                 Additional Comments: was at home with children and Indep PTA    Prior Function Level of Independence: Independent with assistive device(s)  Comments: pt bathes when family was present at home and drives onyl when not dizzy.     Hand Dominance   Dominant Hand: Right    Extremity/Trunk Assessment   Upper Extremity Assessment Upper Extremity Assessment: Defer to OT evaluation    Lower Extremity Assessment Lower Extremity Assessment: Generalized weakness     Cervical / Trunk Assessment Cervical / Trunk Assessment: Normal  Communication   Communication: No difficulties  Cognition Arousal/Alertness: Awake/alert Behavior During Therapy: Restless;Anxious;Impulsive Overall Cognitive Status: Impaired/Different from baseline Area of Impairment: Attention;Memory;Following commands;Safety/judgement;Awareness;Problem solving                   Current Attention Level: Focused Memory: Decreased short-term memory(reports remembering last PT but unable to recall what they d) Following Commands: Follows one step commands with increased time;Follows one step commands inconsistently Safety/Judgement: Decreased awareness of safety;Decreased awareness of deficits Awareness: Intellectual Problem Solving: Slow processing;Decreased initiation;Difficulty sequencing;Requires verbal cues;Requires tactile cues General Comments: pt indep and cognitively intact prior to stroke. Pt confused, states its 1921 and easily agitated. Most likely due to frontal infarct. Pt with difficulty following commands due to delayed processing and impaired memory      General Comments General comments (skin integrity, edema, etc.): assist to [employer] however pt unsuccessful    Exercises     Assessment/Plan    PT Assessment Patient needs continued PT services  PT Problem List Decreased activity tolerance;Decreased balance;Decreased mobility;Decreased knowledge of use of DME;Decreased safety awareness;Decreased knowledge of precautions;Cardiopulmonary status limiting activity;Decreased strength;Decreased coordination;Decreased cognition(BPPV, gaze stability issues)       PT Treatment Interventions DME instruction;Gait training;Functional mobility training;Therapeutic activities;Therapeutic exercise;Balance training;Patient/family education(gaze stability exercises, Epley, Nestor Lewandowsky)    PT Goals (Current goals can be found in the Care Plan section)  Acute Rehab PT  Goals Patient Stated Goal: stop my legs from hurting  PT Goal Formulation: With patient Time For Goal Achievement: 06/11/18 Potential to Achieve Goals: Good    Frequency Min 3X/week   Barriers to discharge        Co-evaluation               AM-PAC PT "6 Clicks" Mobility  Outcome Measure Help needed turning from your back to your side while in a flat bed without using bedrails?: A Little Help needed moving from lying on your back to sitting on the side of a flat bed without using bedrails?: A Little Help needed moving to and from a bed to a chair (including a wheelchair)?: A Lot Help needed standing up from a chair using your arms (e.g., wheelchair or bedside chair)?: A Lot Help needed to walk in hospital room?: A Lot Help needed climbing 3-5 steps with a railing? : Total 6 Click Score: 13    End of Session Equipment Utilized During Treatment: Gait belt Activity Tolerance: (limited by dizziness and nausea) Patient left: with call bell/phone within reach;in chair;with chair alarm set;with nursing/sitter in room Nurse Communication: Mobility status PT Visit Diagnosis: Unsteadiness on feet (R26.81);Difficulty in walking, not elsewhere classified (R26.2)    Time: 0730-0800 PT Time Calculation (min) (ACUTE ONLY): 30 min   Charges:   PT Evaluation $PT Re-evaluation: 1 Re-eval          Kittie Plater, PT, DPT Acute Rehabilitation Services Pager #: [phone] Office #: [phone]   Berline Lopes 05/18/2018, 8:25 AM

## 2018-05-18 NOTE — Progress Notes (Signed)
STROKE TEAM PROGRESS NOTE   SUBJECTIVE (INTERVAL HISTORY) Her RN is at the bedside.  Pt is sitting in chair, not in distress.  Awake alert, answers questions and speak appropriately.  Passed a swallow, will put on diet and start Eliquis today.  BP stable on the higher side, creatinine improving, resume losartan.   OBJECTIVE Vitals:   05/18/18 0700 05/18/18 0800 05/18/18 0900 05/18/18 1000  BP: (!) 155/64 (!) 173/48 (!) 152/58 (!) 145/120  Pulse: 65 68 (!) 55 (!) 55  Resp: 14 15 17 16   Temp:  98.7 F (37.1 C)    TempSrc:  Axillary    SpO2: 96% 92% 90% 91%  Weight:      Height:        CBC:  Recent Labs  Lab 05/12/18 2304  05/17/18 0532 05/18/18 0048  WBC 7.5   < > 9.4 11.2*  NEUTROABS 5.6  --   --   --   HGB 10.2*   < > 7.9* 8.4*  HCT 32.7*   < > 25.8* 27.7*  MCV 91.1   < > 91.2 91.4  PLT 277   < > 216 210   < > = values in this interval not displayed.    Basic Metabolic Panel:  Recent Labs  Lab 05/17/18 0532 05/18/18 0048  NA 139 139  K 3.7 3.6  CL 109 107  CO2 22 20*  GLUCOSE 123* 133*  BUN 21 19  CREATININE 1.30* 1.14*  CALCIUM 8.4* 8.5*    Lipid Panel:     Component Value Date/Time   CHOL 88 05/16/2018 0425   TRIG 77 05/16/2018 0425   HDL 38 (L) 05/16/2018 0425   CHOLHDL 2.3 05/16/2018 0425   VLDL 15 05/16/2018 0425   LDLCALC 35 05/16/2018 0425   HgbA1c:  Lab Results  Component Value Date   HGBA1C 5.6 05/16/2018   Urine Drug Screen: No results found for: LABOPIA, COCAINSCRNUR, LABBENZ, AMPHETMU, THCU, LABBARB  Alcohol Level No results found for: ETH  IMAGING   Ct Angio Head W Or Wo Contrast Ct Angio Neck W Or Wo Contrast 05/15/2018 IMPRESSION:  1. Severely motion degraded examination.  2. Findings suspicious for proximal left M2 occlusion.  3. No evidence of large vessel occlusion more proximally.  4. Calcified plaque at the carotid bifurcations in the neck. Nondiagnostic evaluation for stenosis due to motion.    Dg Chest Port 1  View 05/15/2018 IMPRESSION:  Cardiomegaly. LEFT LOWER lobe infiltrate.    Ct Head Code Stroke Wo Contrast 05/15/2018 IMPRESSION:  1. No evidence of acute intracranial abnormality.  2. ASPECTS is 10.  3. Severe chronic small vessel ischemic disease.    Mr Brain Wo Contrast Result Date: 05/16/2018 CLINICAL DATA:  Sudden onset aphasia yesterday following cardiac catheterization. History of atrial fibrillation. EXAM: MRI HEAD WITHOUT CONTRAST TECHNIQUE: Multiplanar, multiecho pulse sequences of the brain and surrounding structures were obtained without intravenous contrast. COMPARISON:  Head CT and CTA 05/15/2018.  Brain MRI 05/13/2018. FINDINGS: Brain: There is a 9 mm acute cortical/subcortical infarct in the right frontal lobe near the anterior aspect of the centrum semiovale. There is also a 2 mm focus of diffusion signal abnormality in the inferomedial left cerebellum without clearly reduced ADC. A chronic microhemorrhage is again noted in the left putamen. There is also a chronic microhemorrhage in the left frontal white matter. The ventricles and sulci are normal. Patchy to confluent T2 hyperintensities in the cerebral white matter and pons are unchanged from the prior  MRI and nonspecific but compatible with severe chronic small vessel ischemic disease. No mass, midline shift, or extra-axial fluid collection is identified. Vascular: Major intracranial vascular flow voids are preserved. Skull and upper cervical spine: Unremarkable bone marrow signal. Sinuses/Orbits: Bilateral cataract extraction. Clear paranasal sinuses. Trace left mastoid effusion. Other: None. IMPRESSION: 1. Small acute right frontal lobe cortical/subcortical infarct. 2. Punctate acute or early subacute left cerebellar infarct. 3. Severe chronic small vessel ischemic disease. Electronically Signed   By: Logan Bores M.D.   On: 05/16/2018 19:22    Transthoracic Echocardiogram  05/13/2018 Study Conclusion - Left ventricle: The  cavity size was mildly dilated. Wall   thickness was increased in a pattern of mild LVH. Systolic   function was severely reduced. The estimated ejection fraction   was in the range of 20% to 25%. There is akinesis of the   apicalanteroseptal, anterior, and inferior myocardium. Doppler   parameters are consistent with abnormal left ventricular   relaxation (grade 1 diastolic dysfunction). Doppler parameters   are consistent with high ventricular filling pressure. - Aortic valve: There was mild regurgitation. - Left atrium: The atrium was mildly dilated. - Right ventricle: The cavity size was mildly dilated. Systolic   function was mildly to moderately reduced. - Pulmonary arteries: PA peak pressure: 45 mm Hg (S). - Pericardium, extracardiac: A trivial pericardial effusion was   identified posterior to the heart.   IR 4 vessel cerebral arteriogram. RT CFA approach. Findings. 1. Angiographically no evidence of occlusions,stenosis,intraluminal filling defects,dissections or of AV shunting. 2.Venous outflow WNLs.    PHYSICAL EXAM Blood pressure (!) 145/120, pulse (!) 55, temperature 98.7 F (37.1 C), temperature source Axillary, resp. rate 16, height 5\' 7"  (1.702 m), weight 95.9 kg, SpO2 91 %.  General - Well nourished, well developed, not in acute distress.  Ophthalmologic - fundi not visualized due to noncooperation.  Cardiovascular - bradycardia with frequent PACs on tele  Neuro - awake, alert, orientated to self, people and place, but not oriented to time.  PRL, EOMI, visual field full. Facial symmetric. Tongue midline. BUE 4/5 and. BLE 3/5 proximal and 4/5 distal. DTR 1+ and babinski not cooperative. Sensation symmetrical, coordination slow but intact finger-to-nose bilaterally and gait not tested.   ASSESSMENT/PLAN [name] is a 77 y.o. female with history of A. Fib (coumadin), HTN, HLD, breast cancer, NSTEMI 05/13/2018.  presenting with speech difficulties after  cardiac cath. She did not receive IV t-PA due to heparin already administered.  Stroke - left cerebellar and right frontal small infarcts, embolic in nature, could be due to A. fib off Coumadin  CT head - No evidence of acute intracranial abnormality.   MRI head - left cerebellar and right frontal small infarcts, not explaining presenting symptoms  CTA H&N - Severely motion degraded examination. Findings suspicious for proximal left M2 occlusion.   Cerebral Angiogram - no evidence of occlusions or stenosis.  2D Echo - 05/13/2018 - EF 20% to 25%, concerning for Takotsubo cardiomyopathy  LDL - 35  HgbA1c 5.6  VTE prophylaxis - SCDs  Diet - NPO  INR 1.41  warfarin daily prior to admission, will restart eliquis today  Ongoing aggressive stroke risk factor management  Therapy recommendations:  CIR  Disposition:  Pending  Non-STEMI with cardiomyopathy  05/13/2018 off Coumadin for cardiac cath  Cardiac cath unremarkable  On aspirin, will start Eliquis today  EF 20 to 25% now concerning forTakotsubo cardiomyopathy  Cardiology on board, appreciate assistance  On Coreg  Will resume home dose losartan as patient BP stable and creatinine improved  A. fib, chronic  On Coumadin PTA  INR 2.69-2.43-1.57-1.47-1.41  Post cath  EF 20 to 25%  Start Eliquis today  Hypertension  Stable  On Coreg  Will resume home dose of losartan today  Hold off hydralazine at this time . BP goal normotensive  Hyperlipidemia  Lipid lowering medication PTA: Pravachol 80 mg daily  LDL 35, goal < 70  Current lipid lowering medication: Pravastatin 80 mg daily  Continue statin at discharge  Other Stroke Risk Factors  Advanced age  Obesity, Body mass index is 33.11 kg/m., recommend weight loss, diet and exercise as appropriate   Anemia, iron deficiency  Hb 9.2->8.6->7.9->8.4  Ferritin 50 iron 10  will start iron pills 325 twice daily  CBC monitoring  Dysphagia    Have not passed swallow  Speech on board  NPO for now  Continue IVF  Other Active Problems  Anemia due to chronic disease -   Mildly elevated creatinine 1.25->1.28->1.30->1.14   Hospital day # 5  This patient is critically ill due to suspected stroke, CHF, non-STEMI, A. fib and at significant risk of neurological worsening, death form recurrent stroke, hemorrhagic conversion, heart failure, seizure. This patient's care requires constant monitoring of vital signs, hemodynamics, respiratory and cardiac monitoring, review of multiple databases, neurological assessment, discussion with family, other specialists and medical decision making of high complexity. I spent 30 minutes of neurocritical care time in the care of this patient.     Rosalin Hawking, MD PhD Stroke Neurology 05/18/2018 10:52 AM    To contact Stroke Continuity provider, please refer to [email or web address] After hours, contact General Neurology

## 2018-05-18 NOTE — Evaluation (Signed)
Speech Language Pathology Evaluation Patient Details Name: Gloria Joseph MRN: [id number] DOB: 30-May-1940 Today's Date: 05/18/2018 Time: 9476-5465 SLP Time Calculation (min) (ACUTE ONLY): 15 min  Problem List:  Patient Active Problem List   Diagnosis Date Noted  . Stroke ([place]) 05/15/2018  . Middle cerebral artery embolism, left 05/15/2018  . Takotsubo cardiomyopathy   . Fall 05/13/2018  . Vertigo 05/13/2018  . Abnormal EKG 05/13/2018  . Diabetes mellitus without complication ([place]) 03/54/6568  . NSTEMI (non-ST elevated myocardial infarction) ([place]) 05/13/2018  . Acute metabolic encephalopathy 12/75/1700  . Anticoagulated on Coumadin   . Elevated troponin   . Dizziness   . Encounter for therapeutic drug monitoring 05/03/2014  . Obesity (BMI 30-39.9) 12/08/2013  . Renal insufficiency, mild 09/23/2013  . Recurrent falls 09/23/2013  . PAF (paroxysmal atrial fibrillation) ([place]) 12/07/2012  . Anemia 12/18/2011  . Vitamin B 12 deficiency 05/27/2011  . QT prolongation 04/13/2011  . Bradycardia 04/11/2011  . Syncopal episodes 04/11/2011  . Hypothyroidism 07/04/2010  . EDEMA- LOCALIZED 07/04/2010  . VITAMIN D DEFICIENCY 03/01/2009  . INCONTINENCE, URGE 01/07/2008  . Type II or unspecified type diabetes mellitus with renal manifestations, not stated as uncontrolled(250.40) 06/22/2007  . HYPERLIPIDEMIA 06/22/2007  . FATIGUE 06/22/2007  . Essential hypertension 06/24/2006  . OSTEOPENIA 06/24/2006   Past Medical History:  Past Medical History:  Diagnosis Date  . Anemia 12/18/2011  . Atrial fibrillation ([place]) 04/13/2011  . Cancer [facility])    breast CA s/p Right mastectomy  . Diabetes mellitus, type 2 (HCC)    diet controlled  . Hx of echocardiogram    Echocardiogram (12/15): Moderate LVH, EF 55-60%, normal wall motion, grade 1 diastolic dysfunction, aortic sclerosis without stenosis, mild AI, MAC  . Hyperlipidemia   . Hypertension    medicine refractory  . Hypothyroidism    s/p thryroid gland ablation  . Neuromuscular disorder ([place])   . NSTEMI (non-ST elevated myocardial infarction) ([place]) 05/13/2018  . Osteopenia   . Renal insufficiency, mild 09/23/2013  . Stroke [facility]) 05/15/2018   Past Surgical History:  Past Surgical History:  Procedure Laterality Date  . BREAST ENHANCEMENT SURGERY    . BREAST IMPLANT REMOVAL  2000   Unilaterally   . BREAST SURGERY    . CHOLECYSTECTOMY    . COLONOSCOPY  2002   Neg  . CORONARY ANGIOGRAPHY N/A 05/15/2018   Procedure: CORONARY ANGIOGRAPHY;  Surgeon: Burnell Blanks, MD;  Location: [place] CV LAB;  Service: Cardiovascular;  Laterality: N/A;  . MASTECTOMY    . RADIOLOGY WITH ANESTHESIA N/A 05/15/2018   Procedure: IR WITH ANESTHESIA;  Surgeon: Luanne Bras, MD;  Location: [place];  Service: Radiology;  Laterality: N/A;  . RAI  04/04/2006  . TUBAL LIGATION     HPI:  pt is a 77 yo female s/p fall hitting R side of head, CT head negative and MRI brain negative for any acute events. pt with vertigo and PMHx: includes chronic vertigo, R mastectomy, edema, Afib, DMT2, HLD, HTN, HCC, obesity, edema, depression. Pt underwent a cath procedure on 12/27 and then had a stroke shortly after. MRI revealed L cerebellar and R frontal infarcts. Pt then transfered to 4n and intubated, extubated on 12/29.   Assessment / Plan / Recommendation Clinical Impression  Pt demonstrates deficits consistent with mild right hemisphere disorder with impaired awareness and short term memory requiring assistance for safety and basic problem solving with ADLs. Recommend f/u at CIR. SLP will follow acutely to target cognition.  SLP Assessment  SLP Recommendation/Assessment: Patient needs continued Speech Lanaguage Pathology Services SLP Visit Diagnosis: Frontal lobe and executive function deficit Frontal lobe and executive function deficit following: Cerebral infarction    Follow Up Recommendations  None    Frequency and Duration min  2x/week  1 week      SLP Evaluation Cognition  Overall Cognitive Status: Impaired/Different from baseline Arousal/Alertness: Awake/alert Orientation Level: Oriented to place;Disoriented to time;Disoriented to situation;Oriented to person Attention: Focused;Sustained Focused Attention: Appears intact Sustained Attention: Impaired Sustained Attention Impairment: Verbal basic;Functional basic Memory: Impaired Memory Impairment: Decreased short term memory Decreased Short Term Memory: Verbal basic Awareness: Impaired Awareness Impairment: Intellectual impairment Problem Solving: Impaired Problem Solving Impairment: Functional basic Executive Function: Initiating Behaviors: Restless Safety/Judgment: Impaired       Comprehension  Auditory Comprehension Overall Auditory Comprehension: Appears within functional limits for tasks assessed    Expression Verbal Expression Overall Verbal Expression: Appears within functional limits for tasks assessed Written Expression Dominant Hand: Right   Oral / Motor  Oral Motor/Sensory Function Overall Oral Motor/Sensory Function: Within functional limits Motor Speech Overall Motor Speech: Appears within functional limits for tasks assessed   GO                   Herbie Baltimore, MA [place] Pager [phone] Office [phone]  Lynann Beaver 05/18/2018, 11:40 AM

## 2018-05-18 NOTE — Evaluation (Signed)
Occupational Therapy Evaluation Patient Details Name: Gloria Joseph DOI MRN: [id number] DOB: 03-15-1941 Today's Date: 05/18/2018    History of Present Illness 77 yo female s/p fall hitting R side of head, CT head negative and MRI brain negative for any acute events on 12/25. Pt underwent a cath procedure on 12/27 and then had a stroke shortly after. MRI revealed L cerebellar and R frontal infarcts. Pt then transfered to 4n and intubated, extubated on 12/29. PMHx: includes chronic vertigo, R mastectomy, edema, Afib, DMT2, HLD, HTN, HCC, obesity, edema, depression.   Clinical Impression   PTA, pt was living with her children and was independent with ADLs and light IADLs. Pt currently requiring Mod A for UB ADLs, Max A for LB ADLs, and Mod-Max A +2 for functional transfers. Pt presenting with decreased balance, coordination, cognition, safety, and strength as well as dizziness and nausea throughout session impacting her functional performance. Pt will require further acute OT to facilitate safe dc. Recommend dc to CIR intensive OT to optimize safety, independence with ADLs, and return to PLOF.      Follow Up Recommendations  CIR;Supervision/Assistance - 24 hour    Equipment Recommendations  Other (comment)(Defer to next venue)    Recommendations for Other Services PT consult;Speech consult;Rehab consult     Precautions / Restrictions Precautions Precautions: Fall Precaution Comments: pt very dizzy due to cerebellar infarct and h/o vertigo Restrictions Weight Bearing Restrictions: No      Mobility Bed Mobility Overal bed mobility: Needs Assistance Bed Mobility: Sidelying to Sit   Sidelying to sit: Mod assist;+2 for safety/equipment;HOB elevated       General bed mobility comments: max directional verbal cues, pt able to bring LEs off EOB, modA for trunk elevation, pt with dizziness modA required to stabilize patient  Transfers Overall transfer level: Needs  assistance Equipment used: 2 person hand held assist Transfers: Sit to/from [facility] Sit to Stand: Mod assist;+2 physical assistance Stand pivot transfers: Max assist;+2 physical assistance       General transfer comment: pt very unsteady due to dizziness, strong R lateral lean, max directional verbal and tactile cues to complete, pt with c/o nausea as well but no vomitting    Balance Overall balance assessment: Needs assistance Sitting-balance support: Feet supported;Bilateral upper extremity supported Sitting balance-Leahy Scale: Poor Sitting balance - Comments: strong lateral lean, c/o nausea and dizziness   Standing balance support: Bilateral upper extremity supported Standing balance-Leahy Scale: Poor Standing balance comment: dependent on physical assist due to dizziness and nausa                           ADL either performed or assessed with clinical judgement   ADL Overall ADL's : Needs assistance/impaired Eating/Feeding: NPO   Grooming: Moderate assistance;Sitting;Oral care Grooming Details (indicate cue type and reason): Using mouth swab and rinse for oral care while seated. Despite support at seat, pt requiring Min-Mod A for sitting balance and safety with impulsive movements. Pt requiring Mod A to manage mouth rinse and increased cues to dip swab into rinse and then perform brushing motion.  Upper Body Bathing: Moderate assistance;Sitting   Lower Body Bathing: Maximal assistance;Sit to/from stand;+2 for physical assistance   Upper Body Dressing : Moderate assistance;Sitting   Lower Body Dressing: Maximal assistance;+2 for physical assistance;Sit to/from stand Lower Body Dressing Details (indicate cue type and reason): Pt attempting to don socks at EOB, but unabel to due to dizziness. Pt requiring Max A  and second person to assist with sitting balance. Pt presenting with posterior and left lateral lean sitting at EOB Toilet Transfer:  Maximal assistance;+2 for physical assistance;Stand-pivot;BSC Toilet Transfer Details (indicate cue type and reason): Max A +2 for stand pivot to [facility] (turning to left). Pt with poor balance and coordination. Pt very dizzy and becoming nauseous.          Functional mobility during ADLs: Maximal assistance;+2 for physical assistance;Cueing for sequencing;Cueing for safety(stand pivot only) General ADL Comments: Pt presenting with decreased balance, cognition, cooridnation, awareness, and vision.      Vision Baseline Vision/History: No visual deficits Patient Visual Report: Blurring of vision;Other (comment)(Pt agreeing vision is blurry, but unable to describe) Vision Assessment?: Vision impaired- to be further tested in functional context Additional Comments: Pt requiring cues for targeted reach and overshooting during oral care to the left. Pt with decreased cognition and difficulty describing vision deficits. Will continue to assess     Perception     Praxis      Pertinent Vitals/Pain Pain Assessment: Faces Faces Pain Scale: Hurts even more Pain Location: "my legs" Pain Descriptors / Indicators: Grimacing;Moaning Pain Intervention(s): Monitored during session;Limited activity within patient's tolerance;Repositioned     Hand Dominance Right   Extremity/Trunk Assessment Upper Extremity Assessment Upper Extremity Assessment: Generalized weakness(Decreased coordination; will continue to assess)   Lower Extremity Assessment Lower Extremity Assessment: Defer to PT evaluation   Cervical / Trunk Assessment Cervical / Trunk Assessment: Normal   Communication Communication Communication: No difficulties   Cognition Arousal/Alertness: Awake/alert Behavior During Therapy: Restless;Anxious;Impulsive Overall Cognitive Status: Impaired/Different from baseline Area of Impairment: Attention;Memory;Following commands;Safety/judgement;Awareness;Problem solving                    Current Attention Level: Focused Memory: Decreased short-term memory(reports remembering last PT but unable to recall what they d) Following Commands: Follows one step commands with increased time;Follows one step commands inconsistently Safety/Judgement: Decreased awareness of safety;Decreased awareness of deficits Awareness: Intellectual Problem Solving: Slow processing;Decreased initiation;Difficulty sequencing;Requires verbal cues;Requires tactile cues General Comments: pt indep and cognitively intact prior to stroke. Pt confused, states its 1921 and easily agitated. Most likely due to frontal infarct. Pt with difficulty following commands due to delayed processing and impaired memory. Tangiental in thoughts and requiring increased time and cues throughout session. Pt emotions quickly switches and pt tearful mutiple times during session   General Comments  VSS. Attempt urination at [facility] and unsuccessful; RN present    Exercises Exercises: (her out patient PT vestibular exercises )   Shoulder Instructions      Home Living Family/patient expects to be discharged to:: Inpatient rehab Living Arrangements: Children Available Help at Discharge: Family Type of Home: House Home Access: Level entry     Home Layout: Multi-level;Full bath on main level;Able to live on main level with bedroom/bathroom     Bathroom Shower/Tub: Teacher, early years/pre: Standard     Home Equipment: Environmental consultant - 2 wheels;Cane - single point;Bedside commode;Shower seat;Grab bars - toilet;Grab bars - tub/shower;Hand held shower head   Additional Comments: Information collected from prior evaluation. Pt was at home with children and Indep PTA      Prior Functioning/Environment Level of Independence: Independent with assistive device(s)        Comments: pt bathes when family was present at home and drives only when not dizzy.        OT Problem List: Decreased strength;Decreased range of  motion;Decreased activity tolerance;Impaired balance (sitting and/or standing);Impaired  vision/perception;Decreased coordination;Decreased cognition;Decreased safety awareness;Decreased knowledge of use of DME or AE;Decreased knowledge of precautions;Pain      OT Treatment/Interventions: Self-care/ADL training;Therapeutic exercise;Energy conservation;DME and/or AE instruction;Therapeutic activities;Patient/family education    OT Goals(Current goals can be found in the care plan section) Acute Rehab OT Goals Patient Stated Goal: stop my legs from hurting  OT Goal Formulation: With patient Time For Goal Achievement: 06/01/18 Potential to Achieve Goals: Good ADL Goals Pt Will Perform Grooming: with set-up;with supervision;sitting Pt Will Perform Upper Body Bathing: with min guard assist;sitting Pt Will Perform Lower Body Bathing: with mod assist;sit to/from stand Pt Will Transfer to Toilet: with mod assist;stand pivot transfer;bedside commode Additional ADL Goal #1: Pt will perform bed mobility with Min Guard A in preparation for ADLs. Additional ADL Goal #2: Pt will follow 50% of cues during an ADL Additional ADL Goal #3: Pt will maintain sitting at EOB during ADL with Min Guard A  OT Frequency: Min 2X/week   Barriers to D/C:            Co-evaluation PT/OT/SLP Co-Evaluation/Treatment: Yes Reason for Co-Treatment: Complexity of the patient's impairments (multi-system involvement);To address functional/ADL transfers;For patient/therapist safety   OT goals addressed during session: ADL's and self-care      AM-PAC OT "6 Clicks" Daily Activity     Outcome Measure Help from another person eating meals?: A Lot Help from another person taking care of personal grooming?: A Lot Help from another person toileting, which includes using toliet, bedpan, or urinal?: A Lot Help from another person bathing (including washing, rinsing, drying)?: A Lot Help from another person to put on and taking  off regular upper body clothing?: A Lot Help from another person to put on and taking off regular lower body clothing?: A Lot 6 Click Score: 12   End of Session Equipment Utilized During Treatment: Gait belt Nurse Communication: Mobility status  Activity Tolerance: Patient limited by fatigue;Patient limited by lethargy(limited by nausea and dizziness) Patient left: in chair;with call bell/phone within reach;with chair alarm set;with nursing/sitter in room  OT Visit Diagnosis: Unsteadiness on feet (R26.81);Other abnormalities of gait and mobility (R26.89);Muscle weakness (generalized) (M62.81);Other symptoms and signs involving cognitive function;Dizziness and giddiness (R42);Pain Pain - Right/Left: (Bilateral) Pain - part of body: Leg                Time: 0730-0759 OT Time Calculation (min): 29 min Charges:  OT General Charges $OT Visit: 1 Visit OT Evaluation $OT Eval High Complexity: 1 High  Tanasia Budzinski MSOT, OTR/L Acute Rehab Pager: [phone] Office: [place] 05/18/2018, 11:32 AM

## 2018-05-18 NOTE — Progress Notes (Addendum)
Progress Note  Patient Name: Gloria Joseph Date of Encounter: 05/18/2018  Primary Cardiologist: Thompson Grayer, MD  Subjective   Feeling well. No chest pain or dyspnea.   Inpatient Medications    Scheduled Meds: . aspirin EC  325 mg Oral Daily   Or  . aspirin  300 mg Rectal Daily  . carvedilol  3.125 mg Oral BID  . cholecalciferol  1,000 Units Oral Daily  . citalopram  20 mg Oral Daily  . furosemide  20 mg Intravenous Once  . insulin aspart  0-15 Units Subcutaneous Q4H  . levothyroxine  112 mcg Oral Q0600  . multivitamin with minerals  1 tablet Oral Daily  . omega-3 acid ethyl esters  1 g Oral Daily  . potassium chloride  40 mEq Oral Once  . pravastatin  80 mg Oral Daily  . vitamin B-12  1,000 mcg Oral Daily   Continuous Infusions: . clevidipine Stopped (05/16/18 0307)  . famotidine (PEPCID) IV 20 mg (05/18/18 0813)  . lactated ringers 50 mL/hr at 05/18/18 5809   PRN Meds: acetaminophen **OR** acetaminophen (TYLENOL) oral liquid 160 mg/5 mL **OR** acetaminophen, hydrALAZINE, nitroGLYCERIN, ondansetron **OR** ondansetron (ZOFRAN) IV, senna-docusate   Vital Signs    Vitals:   05/18/18 0500 05/18/18 0600 05/18/18 0700 05/18/18 0800  BP: (!) 142/38 (!) 148/54 (!) 155/64 (!) 173/48  Pulse:  (!) 49 65 68  Resp: 12 13 14 15   Temp:    98.7 F (37.1 C)  TempSrc:    Axillary  SpO2:  92% 96% 92%  Weight:      Height:        Intake/Output Summary (Last 24 hours) at 05/18/2018 1017 Last data filed at 05/18/2018 0800 Gross per 24 hour  Intake 1153.47 ml  Output 1325 ml  Net -171.53 ml   Filed Weights   05/15/18 0300 05/15/18 2040 05/16/18 1551  Weight: 93.4 kg 94.9 kg 95.9 kg    Telemetry    SR with PACs - Personally Reviewed  ECG    N/A  Physical Exam   GEN: No acute distress.   Neck: No JVD Cardiac: RRR, no murmurs, rubs, or gallops.  Respiratory: faint bibasilar rales GI: Soft, nontender, non-distended  MS: No edema; No deformity. Neuro:   Nonfocal  Psych: Normal affect   Labs    Chemistry Recent Labs  Lab 05/16/18 0425 05/17/18 0532 05/18/18 0048  NA 139 139 139  K 3.8 3.7 3.6  CL 107 109 107  CO2 22 22 20*  GLUCOSE 117* 123* 133*  BUN 19 21 19   CREATININE 1.28* 1.30* 1.14*  CALCIUM 8.5* 8.4* 8.5*  GFRNONAA 40* 39* 46*  GFRAA 46* 46* 53*  ANIONGAP 10 8 12      Hematology Recent Labs  Lab 05/16/18 0425 05/17/18 0532 05/18/18 0048  WBC 7.3 9.4 11.2*  RBC 3.13* 2.83* 3.03*  HGB 8.6* 7.9* 8.4*  HCT 28.1* 25.8* 27.7*  MCV 89.8 91.2 91.4  MCH 27.5 27.9 27.7  MCHC 30.6 30.6 30.3  RDW 15.2 15.3 15.1  PLT 246 216 210    Cardiac Enzymes Recent Labs  Lab 05/13/18 0226 05/13/18 0619 05/13/18 1234  TROPONINI 1.00* 1.46* 1.15*    Radiology    Mr Brain Wo Contrast  Result Date: 05/16/2018 CLINICAL DATA:  Sudden onset aphasia yesterday following cardiac catheterization. History of atrial fibrillation. EXAM: MRI HEAD WITHOUT CONTRAST TECHNIQUE: Multiplanar, multiecho pulse sequences of the brain and surrounding structures were obtained without intravenous contrast. COMPARISON:  Head CT  and CTA 05/15/2018.  Brain MRI 05/13/2018. FINDINGS: Brain: There is a 9 mm acute cortical/subcortical infarct in the right frontal lobe near the anterior aspect of the centrum semiovale. There is also a 2 mm focus of diffusion signal abnormality in the inferomedial left cerebellum without clearly reduced ADC. A chronic microhemorrhage is again noted in the left putamen. There is also a chronic microhemorrhage in the left frontal white matter. The ventricles and sulci are normal. Patchy to confluent T2 hyperintensities in the cerebral white matter and pons are unchanged from the prior MRI and nonspecific but compatible with severe chronic small vessel ischemic disease. No mass, midline shift, or extra-axial fluid collection is identified. Vascular: Major intracranial vascular flow voids are preserved. Skull and upper cervical spine:  Unremarkable bone marrow signal. Sinuses/Orbits: Bilateral cataract extraction. Clear paranasal sinuses. Trace left mastoid effusion. Other: None. IMPRESSION: 1. Small acute right frontal lobe cortical/subcortical infarct. 2. Punctate acute or early subacute left cerebellar infarct. 3. Severe chronic small vessel ischemic disease. Electronically Signed   By: Logan Bores M.D.   On: 05/16/2018 19:22   Dg Chest Port 1 View  Result Date: 05/17/2018 CLINICAL DATA:  77 year old female with history of trauma from a fall. EXAM: PORTABLE CHEST 1 VIEW COMPARISON:  Chest x-ray 05/15/2018. FINDINGS: An endotracheal tube is in place with tip 4.5 cm above the carina. Nasogastric tube extending into the stomach with side port just below the level of the gastroesophageal junction. Lung volumes are low. Widespread interstitial prominence and patchy multifocal airspace disease, most evident in the left lower lobe, concerning for multilobar bronchopneumonia. Small left pleural effusion. Mild cardiomegaly. Upper mediastinal contours are within normal limits. Aortic atherosclerosis. Surgical clips in the right axillary region suggesting prior lymph node dissection. IMPRESSION: 1. Support apparatus, as above. 2. Patchy multifocal interstitial and airspace disease, most confluent in the left lower lobe, concerning for multilobar bronchopneumonia. Aeration has slightly improved compared to the prior examination. 3. Small left pleural effusion. Electronically Signed   By: Vinnie Langton M.D.   On: 05/17/2018 09:53   Dg Abd Portable 1v  Result Date: 05/16/2018 CLINICAL DATA:  Encounter for orogastric tube placement. Hiatal hernia based on previous abdominal CT. EXAM: PORTABLE ABDOMEN - 1 VIEW COMPARISON:  CT 01/15/2018 FINDINGS: Orogastric tube has been placed. The tip in the mid upper abdomen likely in the stomach body region. There is gas in the stomach and gas in the visualized colon. Few patchy densities in the left lower  lobe may represent atelectasis but nonspecific. IMPRESSION: Orogastric tube in the upper abdomen and likely in the gastric body based on the known hiatal hernia. Electronically Signed   By: Markus Daft M.D.   On: 05/16/2018 12:20    Cardiac Studies   Echo:   05/13/18  - Left ventricle: The cavity size was mildly dilated. Wall thickness was increased in a pattern of mild LVH. Systolic function was severely reduced. The estimated ejection fraction was in the range of 20% to 25%. There is akinesis of the apicalanteroseptal, anterior, and inferior myocardium. Doppler parameters are consistent with abnormal left ventricular relaxation (grade 1 diastolic dysfunction). Doppler parameters are consistent with high ventricular filling pressure. - Aortic valve: There was mild regurgitation. - Left atrium: The atrium was mildly dilated. - Right ventricle: The cavity size was mildly dilated. Systolic function was mildly to moderately reduced. - Pulmonary arteries: PA peak pressure: 45 mm Hg (S). - Pericardium, extracardiac: A trivial pericardial effusion was identified posterior to the heart.  Cardiac cath:  05/15/18  1. No angiographic evidence of CAD 2. Non-ischemic cardiomyopathy, possible stress induced cardiomyopathy (Takotsubo's cardiomyopathy).   Patient Profile     77 y.o. female with a hx of hypertension, hyperlipidemia, diabetes mellitus, hypothyroidism, depression,Paroxysmalatrial fibrillation on Coumadin, bradycardia, chronic anemia, breast cancer (right mastectomy)and memory issuewhopresented after mechanical fall and seenfor the evaluation of NSTEMIat the request of Dr. Blaine Hamper.  Found to have NSTEMI and found to have non ischemic CM on LHC. Developed aphasia after [place] ane neurology consulted, and underwent cerebral arteriogram. Required intubation for procedure.  Assessment & Plan    1. Non ischemic cardiomyopathy - Echo showed LVEF of 20-25%.  No  angiographic evidence of CAD by cath. Features suggestive of Takotsubo. Mill rales noted by exam today. She got IV lasix 20mg  yesterday. Will give one more dose today. Continue coreg 3.125mg  BID. Scr improving. Addition ARB/ Spironolactone vs lasix per MD. Hx of angioedema on lisinopril as well as spironolactone listed as allergy causing low potassium (unusual).   2. CVA - Plan to start Eliquis once passed swallowing study - Will defer to neurology  3. PAF - Maintaining sinus rhythm. Continue coreg. Anticoagulation as above.    For questions or updates, please contact [place] Please consult [email or web address] for contact info under        SignedLeanor Kail, PA  05/18/2018, 10:17 AM    Patient seen and examined. Agree with assessment and plan. No chest pain. BP elevated earlier today at 173/48, now 147/59.  If pt had angioedema on lisinopril, not a candidate for ARB or entresto.  Cr 1.14 today. Consider a rechallenge of low dose spironolactone at 12.5 tomorrow.   Troy Sine, MD, [facility] 05/18/2018 4:08 PM

## 2018-05-18 NOTE — Progress Notes (Addendum)
Patient arrived to [id number]. A&O x3 patient is disoriented to time. Generalized bruising bilaterally. Patient states no pain just weakness is legs. Moisture associated breakdown around perineal folds. Nurse will place barrier cream. Incision on right femoral from cardiac cath is dry and open to air. Tele has been placed. Patient's daughter at bedside. Nurse will continue to monitor. Icard

## 2018-05-18 NOTE — Plan of Care (Signed)
  Problem: Education: Goal: Knowledge of disease or condition will improve Outcome: Not Progressing Goal: Knowledge of secondary prevention will improve Outcome: Not Progressing Goal: Knowledge of patient specific risk factors addressed and post discharge goals established will improve Outcome: Not Progressing   Problem: Coping: Goal: Will verbalize positive feelings about self Outcome: Progressing

## 2018-05-18 NOTE — Consult Note (Signed)
Physical Medicine and Rehabilitation Consult Reason for Consult: Decreased functional ability after fall Referring Physician: Dr.Xu   HPI: Gloria Joseph is a 77 y.o.right handed female with history of hypertension, diabetes mellitus, atrial fibrillation on Coumadin, breast cancer right mastectomy. Per chart review and patient, lives with ? Son. Son works during the day. Presented 05/13/2018 after a fall striking her head. No loss of consciousness. In the ED complaints of vertigo. She did have episode of nausea and vomiting.INR 2.69. Elevated troponin 1.00.Cranial CT scan reviewed, unremarkable for acute intracranial process. MRI the brain severe chronic small vessel ischemic changes and no acute abnormalities. CT angiogram of head and neck no evidence of large vessel occlusion. Echocardiogram with ejection fraction 32% grade 1 diastolic dysfunction. Cardiac catheterization was completed 05/15/2018 for history of atrial fibrillation with elevated troponin and no evidence of CIDP noted. Nonischemic cardiopathy possible stress-related-induced cardiomyopathy. Patient's Coumadin since discontinued started on eliquis. A follow-up MRI 05/16/2018 showed small acute right frontal lobe cortical subcortical infarction. Punctate acute or early subacute left cerebellar infarction. It was advised continue anticoagulation. Therapy evaluations completed with recommendations of physical medicine rehabilitation consult.  Review of Systems  Constitutional: Negative for fever.  HENT: Positive for hearing loss.   Eyes: Negative for blurred vision and double vision.  Respiratory: Negative for cough and shortness of breath.   Cardiovascular: Negative for chest pain and leg swelling.  Gastrointestinal: Positive for nausea and vomiting.  Genitourinary: Negative for dysuria, flank pain and hematuria.  Musculoskeletal: Positive for falls and myalgias.  Skin: Negative for rash.  Neurological: Positive for  dizziness and focal weakness.  All other systems reviewed and are negative.  Past Medical History:  Diagnosis Date  . Anemia 12/18/2011  . Atrial fibrillation ([place]) 04/13/2011  . Cancer [facility])    breast CA s/p Right mastectomy  . Diabetes mellitus, type 2 (HCC)    diet controlled  . Hx of echocardiogram    Echocardiogram (12/15): Moderate LVH, EF 55-60%, normal wall motion, grade 1 diastolic dysfunction, aortic sclerosis without stenosis, mild AI, MAC  . Hyperlipidemia   . Hypertension    medicine refractory  . Hypothyroidism    s/p thryroid gland ablation  . Neuromuscular disorder ([place])   . NSTEMI (non-ST elevated myocardial infarction) ([place]) 05/13/2018  . Osteopenia   . Renal insufficiency, mild 09/23/2013  . Stroke [facility]) 05/15/2018   Past Surgical History:  Procedure Laterality Date  . BREAST ENHANCEMENT SURGERY    . BREAST IMPLANT REMOVAL  2000   Unilaterally   . BREAST SURGERY    . CHOLECYSTECTOMY    . COLONOSCOPY  2002   Neg  . CORONARY ANGIOGRAPHY N/A 05/15/2018   Procedure: CORONARY ANGIOGRAPHY;  Surgeon: Burnell Blanks, MD;  Location: [place] [facility];  Service: Cardiovascular;  Laterality: N/A;  . MASTECTOMY    . RADIOLOGY WITH ANESTHESIA N/A 05/15/2018   Procedure: IR WITH ANESTHESIA;  Surgeon: Luanne Bras, MD;  Location: [place];  Service: Radiology;  Laterality: N/A;  . RAI  04/04/2006  . TUBAL LIGATION     Family History  Problem Relation Age of Onset  . Depression Mother   . Alcohol abuse Father   . Heart attack Brother 66  . Cancer Other        Uncle, not specific  . Cancer Other        Aunt, not specific. GYN CA  . Breast cancer Other        Aunt, not specific  .  Diabetes Other        Grandmother, not specific  . Cancer Maternal Grandfather        colon   Social History:  reports that she has never smoked. She has never used smokeless tobacco. She reports that she does not drink alcohol or use drugs. Allergies:  Allergies    Allergen Reactions  . Lisinopril     ? Angioedema (NOT ARB candidate) Because of a history of documented adverse serious drug reaction;Medi Alert bracelet  is recommended  . Hctz [Hydrochlorothiazide]     hyponatremia  . Spironolactone     REACTION: low potassium ???  . Penicillins Other (See Comments)    Pt states medication doesn't work for her d/t her usage of it several years ago.  . Amlodipine Besylate     REACTION: edema   Medications Prior to Admission  Medication Sig Dispense Refill  . carvedilol (COREG) 3.125 MG tablet Take 1 tablet by mouth 2 (two) times daily.    . Cholecalciferol (VITAMIN D-3) 1000 UNITS CAPS Take 1 capsule by mouth daily.    . citalopram (CELEXA) 20 MG tablet Take 20 mg by mouth daily.    . Coenzyme Q10 (CO Q 10 PO) Take 1 capsule by mouth daily.    . Flaxseed, Linseed, (FLAXSEED OIL PO) Take 1 tablet by mouth daily.    . hydrALAZINE (APRESOLINE) 100 MG tablet Take 150 mg by mouth 2 (two) times daily.    Marland Kitchen levothyroxine (SYNTHROID, LEVOTHROID) 112 MCG tablet Take 1 tablet (112 mcg) by mouth daily. EXCEPT Tuesdays and Thursdays take 1/2 tablet (56 mcg) by mouth daily.    Marland Kitchen losartan (COZAAR) 100 MG tablet Take 100 mg by mouth daily.    . metFORMIN (GLUCOPHAGE) 1000 MG tablet Take 500 mg by mouth 2 (two) times daily.    . Multiple Vitamins-Minerals (MULTIVITAMINS) CHEW Chew 1 tablet by mouth daily.    . Omega-3 Fatty Acids (FISH OIL PO) Take 1 capsule by mouth daily.    . pravastatin (PRAVACHOL) 80 MG tablet Take 80 mg by mouth daily.     . vitamin B-12 (CYANOCOBALAMIN) 1000 MCG tablet Take 1,000 mcg by mouth daily.    Marland Kitchen warfarin (COUMADIN) 5 MG tablet TAKE 1 TO 1 AND 1/2 TABLET  BY MOUTH DAILY OR AS  DIRECTED (Patient taking differently: Take 2.5-5 mg by mouth daily. Take 2.5 mg (5 mg x 0.5) by mouth every Fri; and take 5 mg (5 mg x 1) all other days) 105 tablet 0    Home: Home Living Family/patient expects to be discharged to:: Inpatient rehab Living  Arrangements: Children Available Help at Discharge: Family Type of Home: House Home Access: Level entry Home Layout: Multi-level, Full bath on main level, Able to live on main level with bedroom/bathroom Bathroom Shower/Tub: Chiropodist: Standard Home Equipment: Environmental consultant - 2 wheels, Cane - single point, Bedside commode, Shower seat, Grab bars - toilet, Grab bars - tub/shower, Hand held shower head Additional Comments: was at home with children and Indep PTA  Functional History: Prior Function Level of Independence: Independent with assistive device(s) Comments: pt bathes when family was present at home and drives onyl when not dizzy. Functional Status:  Mobility: Bed Mobility Overal bed mobility: Needs Assistance Bed Mobility: Sidelying to Sit Sidelying to sit: Mod assist General bed mobility comments: max directional verbal cues, pt able to bring LEs off EOB, modA for trunk elevation, pt with dizziness modA required to stabilize patient Transfers Overall transfer  level: Needs assistance Equipment used: Rolling walker (2 wheeled), Standard walker, 2 person hand held assist(depending on if any dizziness- supervision advised) Transfers: Sit to/from Stand, Stand Pivot Transfers Sit to Stand: Mod assist, +2 physical assistance Stand pivot transfers: Max assist, +2 physical assistance General transfer comment: pt very unsteady due to dizziness, strong R lateral lean, max directional verbal and tactile cues to complete, pt with c/o nausea as well but no vomitting Ambulation/Gait Ambulation/Gait assistance: Min guard Gait Distance (Feet): 50 Feet Assistive device: 1 person hand held assist Gait Pattern/deviations: Step-through pattern, Decreased stride length General Gait Details: unable this date    ADL: ADL Overall ADL's : Modified independent General ADL Comments: Pt requires increased time for tasks and requires a chair behind incase of dizziness; overall back to  baseline for ADLs   Cognition: Cognition Overall Cognitive Status: Impaired/Different from baseline Orientation Level: Oriented to person, Oriented to place, Oriented to situation, Disoriented to time Cognition Arousal/Alertness: Awake/alert Behavior During Therapy: Restless, Anxious, Impulsive Overall Cognitive Status: Impaired/Different from baseline Area of Impairment: Attention, Memory, Following commands, Safety/judgement, Awareness, Problem solving Current Attention Level: Focused Memory: Decreased short-term memory(reports remembering last PT but unable to recall what they d) Following Commands: Follows one step commands with increased time, Follows one step commands inconsistently Safety/Judgement: Decreased awareness of safety, Decreased awareness of deficits Awareness: Intellectual Problem Solving: Slow processing, Decreased initiation, Difficulty sequencing, Requires verbal cues, Requires tactile cues General Comments: pt indep and cognitively intact prior to stroke. Pt confused, states its 1921 and easily agitated. Most likely due to frontal infarct. Pt with difficulty following commands due to delayed processing and impaired memory  Blood pressure (!) 145/120, pulse (!) 55, temperature 98.7 F (37.1 C), temperature source Axillary, resp. rate 16, height 5' 7"  (1.702 m), weight 95.9 kg, SpO2 91 %. Physical Exam  Vitals reviewed. Constitutional: She appears well-developed.  Obese  HENT:  Head: Normocephalic and atraumatic.  Eyes: EOM are normal. Right eye exhibits no discharge. Left eye exhibits no discharge.  Neck: Normal range of motion. Neck supple. No thyromegaly present.  Cardiovascular: Regular rhythm.  Bradycardia  Respiratory: Effort normal and breath sounds normal. No respiratory distress.  +[place]  GI: Soft. Bowel sounds are normal. She exhibits no distension.  Musculoskeletal:     Comments: No edema or tenderness in extremities  Neurological: She is alert.    Makes good eye contact with examiner.  Provides her name, date of birth and age.  Follow simple commands.  Some initial delay in processing.  HOH Motor: B/l UE 5/5 proximal to distal LLE: HF 2+/5, KE 3/5, ADF 4+/5 RLE: HF 4-/5, KE 4/5, ADF 4+/5 Dysphonia  Skin: Skin is warm and dry.  Psychiatric: She has a normal mood and affect. Her behavior is normal.    Results for orders placed or performed during the hospital encounter of 05/12/18 (from the past 24 hour(s))  Glucose, capillary     Status: Abnormal   Collection Time: 05/17/18 11:37 AM  Result Value Ref Range   Glucose-Capillary 108 (H) 70 - 99 mg/dL  Glucose, capillary     Status: None   Collection Time: 05/17/18  4:11 PM  Result Value Ref Range   Glucose-Capillary 98 70 - 99 mg/dL  Glucose, capillary     Status: None   Collection Time: 05/17/18  7:50 PM  Result Value Ref Range   Glucose-Capillary 99 70 - 99 mg/dL  Glucose, capillary     Status: Abnormal   Collection Time: 05/17/18  11:09 PM  Result Value Ref Range   Glucose-Capillary 123 (H) 70 - 99 mg/dL  CBC     Status: Abnormal   Collection Time: 05/18/18 12:48 AM  Result Value Ref Range   WBC 11.2 (H) 4.0 - 10.5 K/uL   RBC 3.03 (L) 3.87 - 5.11 MIL/uL   Hemoglobin 8.4 (L) 12.0 - 15.0 g/dL   HCT 27.7 (L) 36.0 - 46.0 %   MCV 91.4 80.0 - 100.0 fL   MCH 27.7 26.0 - 34.0 pg   MCHC 30.3 30.0 - 36.0 g/dL   RDW 15.1 11.5 - 15.5 %   Platelets 210 150 - 400 K/uL   nRBC 0.0 0.0 - 0.2 %  Basic metabolic panel     Status: Abnormal   Collection Time: 05/18/18 12:48 AM  Result Value Ref Range   Sodium 139 135 - 145 mmol/L   Potassium 3.6 3.5 - 5.1 mmol/L   Chloride 107 98 - 111 mmol/L   CO2 20 (L) 22 - 32 mmol/L   Glucose, Bld 133 (H) 70 - 99 mg/dL   BUN 19 8 - 23 mg/dL   Creatinine, Ser 1.14 (H) 0.44 - 1.00 mg/dL   Calcium 8.5 (L) 8.9 - 10.3 mg/dL   GFR calc non Af Amer 46 (L) >60 mL/min   GFR calc Af Amer 53 (L) >60 mL/min   Anion gap 12 5 - 15  Iron and TIBC      Status: Abnormal   Collection Time: 05/18/18 12:48 AM  Result Value Ref Range   Iron 10 (L) 28 - 170 ug/dL   TIBC 295 250 - 450 ug/dL   Saturation Ratios 3 (L) 10.4 - 31.8 %   UIBC 285 ug/dL  Ferritin     Status: None   Collection Time: 05/18/18 12:48 AM  Result Value Ref Range   Ferritin 50 11 - 307 ng/mL  Glucose, capillary     Status: Abnormal   Collection Time: 05/18/18  3:03 AM  Result Value Ref Range   Glucose-Capillary 124 (H) 70 - 99 mg/dL  Glucose, capillary     Status: None   Collection Time: 05/18/18  8:24 AM  Result Value Ref Range   Glucose-Capillary 91 70 - 99 mg/dL   Comment 1 Notify RN    Comment 2 Document in Chart    Mr Brain Wo Contrast  Result Date: 05/16/2018 CLINICAL DATA:  Sudden onset aphasia yesterday following cardiac catheterization. History of atrial fibrillation. EXAM: MRI HEAD WITHOUT CONTRAST TECHNIQUE: Multiplanar, multiecho pulse sequences of the brain and surrounding structures were obtained without intravenous contrast. COMPARISON:  Head CT and CTA 05/15/2018.  Brain MRI 05/13/2018. FINDINGS: Brain: There is a 9 mm acute cortical/subcortical infarct in the right frontal lobe near the anterior aspect of the centrum semiovale. There is also a 2 mm focus of diffusion signal abnormality in the inferomedial left cerebellum without clearly reduced ADC. A chronic microhemorrhage is again noted in the left putamen. There is also a chronic microhemorrhage in the left frontal white matter. The ventricles and sulci are normal. Patchy to confluent T2 hyperintensities in the cerebral white matter and pons are unchanged from the prior MRI and nonspecific but compatible with severe chronic small vessel ischemic disease. No mass, midline shift, or extra-axial fluid collection is identified. Vascular: Major intracranial vascular flow voids are preserved. Skull and upper cervical spine: Unremarkable bone marrow signal. Sinuses/Orbits: Bilateral cataract extraction. Clear  paranasal sinuses. Trace left mastoid effusion. Other: None. IMPRESSION: 1. Small  acute right frontal lobe cortical/subcortical infarct. 2. Punctate acute or early subacute left cerebellar infarct. 3. Severe chronic small vessel ischemic disease. Electronically Signed   By: Logan Bores M.D.   On: 05/16/2018 19:22   Dg Chest Port 1 View  Result Date: 05/17/2018 CLINICAL DATA:  77 year old female with history of trauma from a fall. EXAM: PORTABLE CHEST 1 VIEW COMPARISON:  Chest x-ray 05/15/2018. FINDINGS: An endotracheal tube is in place with tip 4.5 cm above the carina. Nasogastric tube extending into the stomach with side port just below the level of the gastroesophageal junction. Lung volumes are low. Widespread interstitial prominence and patchy multifocal airspace disease, most evident in the left lower lobe, concerning for multilobar bronchopneumonia. Small left pleural effusion. Mild cardiomegaly. Upper mediastinal contours are within normal limits. Aortic atherosclerosis. Surgical clips in the right axillary region suggesting prior lymph node dissection. IMPRESSION: 1. Support apparatus, as above. 2. Patchy multifocal interstitial and airspace disease, most confluent in the left lower lobe, concerning for multilobar bronchopneumonia. Aeration has slightly improved compared to the prior examination. 3. Small left pleural effusion. Electronically Signed   By: Vinnie Langton M.D.   On: 05/17/2018 09:53   Dg Abd Portable 1v  Result Date: 05/16/2018 CLINICAL DATA:  Encounter for orogastric tube placement. Hiatal hernia based on previous abdominal CT. EXAM: PORTABLE ABDOMEN - 1 VIEW COMPARISON:  CT 01/15/2018 FINDINGS: Orogastric tube has been placed. The tip in the mid upper abdomen likely in the stomach body region. There is gas in the stomach and gas in the visualized colon. Few patchy densities in the left lower lobe may represent atelectasis but nonspecific. IMPRESSION: Orogastric tube in the upper  abdomen and likely in the gastric body based on the known hiatal hernia. Electronically Signed   By: Markus Daft M.D.   On: 05/16/2018 12:20    Assessment/Plan: Diagnosis: small acute right frontal lobe cortical subcortical infarction.  left cerebellar infarction.   Labs and images (see above) independently reviewed.  Records reviewed and summated above.  1. Does the need for close, 24 hr/day medical supervision in concert with the patient's rehab needs make it unreasonable for this patient to be served in a less intensive setting? Yes  2. Co-Morbidities requiring supervision/potential complications: atrial fibrillation (monitor HR with increased activity, cont meds), diastolic dysfunction (monitor for signs/symptoms of fluid overload), HTN (monitor and provide prns in accordance with increased physical exertion and pain), diabetes mellitus (Monitor in accordance with exercise and adjust meds as necessary), breast cancer right mastectomy, leukocytosis (repeat labs, cont to monitor for signs and symptoms of infection, further workup if indicated) 3. Due to safety, disease management, medication administration and patient education, does the patient require 24 hr/day rehab nursing? Yes 4. Does the patient require coordinated care of a physician, rehab nurse, PT (1-2 hrs/day, 5 days/week), OT (1-2 hrs/day, 5 days/week) and SLP (1-2 hrs/day, 5 days/week) to address physical and functional deficits in the context of the above medical diagnosis(es)? Yes Addressing deficits in the following areas: balance, endurance, locomotion, strength, transferring, bathing, dressing, toileting, cognition, speech and psychosocial support 5. Can the patient actively participate in an intensive therapy program of at least 3 hrs of therapy per day at least 5 days per week? Yes 6. The potential for patient to make measurable gains while on inpatient rehab is excellent 7. Anticipated functional outcomes upon discharge from  inpatient rehab are supervision and min assist  with PT, supervision and min assist with OT, modified independent and supervision  with SLP. 8. Estimated rehab length of stay to reach the above functional goals is: 16-19 days. 9. Anticipated D/C setting: Other 10. Anticipated post D/C treatments: SNF 11. Overall Rehab/Functional Prognosis: good  RECOMMENDATIONS: This patient's condition is appropriate for continued rehabilitative care in the following setting: CIR if caregiver support available. Patient has agreed to participate in recommended program. Potentially Note that insurance prior authorization may be required for reimbursement for recommended care.  Comment: Rehab Admissions Coordinator to follow up.   I have personally performed a face to face diagnostic evaluation, including, but not limited to relevant history and physical exam findings, of this patient and developed relevant assessment and plan.  Additionally, I have reviewed and concur with the physician assistant's documentation above.   Delice Lesch, MD, ABPMR Lavon Paganini Angiulli, PA-C 05/18/2018

## 2018-05-19 ENCOUNTER — Inpatient Hospital Stay (HOSPITAL_COMMUNITY): Payer: Medicare Other

## 2018-05-19 DIAGNOSIS — R490 Dysphonia: Secondary | ICD-10-CM

## 2018-05-19 DIAGNOSIS — Z853 Personal history of malignant neoplasm of breast: Secondary | ICD-10-CM

## 2018-05-19 DIAGNOSIS — I5189 Other ill-defined heart diseases: Secondary | ICD-10-CM

## 2018-05-19 DIAGNOSIS — R05 Cough: Secondary | ICD-10-CM

## 2018-05-19 LAB — CBC
HCT: 27 % — ABNORMAL LOW (ref 36.0–46.0)
HCT: 27.7 % — ABNORMAL LOW (ref 36.0–46.0)
HEMOGLOBIN: 8.8 g/dL — AB (ref 12.0–15.0)
Hemoglobin: 8.4 g/dL — ABNORMAL LOW (ref 12.0–15.0)
MCH: 27.8 pg (ref 26.0–34.0)
MCH: 28.7 pg (ref 26.0–34.0)
MCHC: 31.1 g/dL (ref 30.0–36.0)
MCHC: 31.8 g/dL (ref 30.0–36.0)
MCV: 89.4 fL (ref 80.0–100.0)
MCV: 90.2 fL (ref 80.0–100.0)
NRBC: 0 % (ref 0.0–0.2)
Platelets: 225 10*3/uL (ref 150–400)
Platelets: 264 10*3/uL (ref 150–400)
RBC: 3.02 MIL/uL — ABNORMAL LOW (ref 3.87–5.11)
RBC: 3.07 MIL/uL — ABNORMAL LOW (ref 3.87–5.11)
RDW: 14.8 % (ref 11.5–15.5)
RDW: 15 % (ref 11.5–15.5)
WBC: 6.5 10*3/uL (ref 4.0–10.5)
WBC: 7 10*3/uL (ref 4.0–10.5)
nRBC: 0 % (ref 0.0–0.2)

## 2018-05-19 LAB — GLUCOSE, CAPILLARY
Glucose-Capillary: 129 mg/dL — ABNORMAL HIGH (ref 70–99)
Glucose-Capillary: 91 mg/dL (ref 70–99)
Glucose-Capillary: 102 mg/dL — ABNORMAL HIGH (ref 70–99)
Glucose-Capillary: 110 mg/dL — ABNORMAL HIGH (ref 70–99)

## 2018-05-19 LAB — BASIC METABOLIC PANEL
Anion gap: 11 (ref 5–15)
BUN: 20 mg/dL (ref 8–23)
CO2: 20 mmol/L — ABNORMAL LOW (ref 22–32)
Calcium: 8.9 mg/dL (ref 8.9–10.3)
Chloride: 108 mmol/L (ref 98–111)
Creatinine, Ser: 1.07 mg/dL — ABNORMAL HIGH (ref 0.44–1.00)
GFR calc non Af Amer: 50 mL/min — ABNORMAL LOW (ref >60)
GFR, EST AFRICAN AMERICAN: 58 mL/min — AB (ref >60)
Glucose, Bld: 137 mg/dL — ABNORMAL HIGH (ref 70–99)
Potassium: 3.9 mmol/L (ref 3.5–5.1)
Sodium: 139 mmol/L (ref 135–145)

## 2018-05-19 LAB — BRAIN NATRIURETIC PEPTIDE: B Natriuretic Peptide: 661.5 pg/mL — ABNORMAL HIGH (ref 0.0–100.0)

## 2018-05-19 MED ORDER — ENSURE ENLIVE PO LIQD
237.0000 mL | Freq: Two times a day (BID) | ORAL | Status: DC
  Administered 2018-05-19 – 2018-05-22 (×6): 237 mL via ORAL

## 2018-05-19 MED ORDER — PHENOL 1.4 % MT LIQD: 1.0000 | OROMUCOSAL | Status: DC | PRN

## 2018-05-19 MED ORDER — GUAIFENESIN 100 MG/5ML PO SOLN
5.0000 mL | ORAL | Status: DC | PRN
  Administered 2018-05-19 – 2018-05-22 (×10): 100 mg via ORAL
  Filled 2018-05-19 (×10): qty 5

## 2018-05-19 NOTE — Progress Notes (Addendum)
STROKE TEAM PROGRESS NOTE   SUBJECTIVE (INTERVAL HISTORY) Her friend is at the bedside. They are both impressed with her progress. No discernable aphasia. Strength improving. Does have a sore throat - ? From vent vs viral. Afebrile. Will add throat spray. Hopes for rehab tomorrow.    OBJECTIVE Vitals:   05/18/18 1952 05/19/18 0001 05/19/18 0348 05/19/18 0759  BP: (!) 149/60 (!) 142/82 (!) 157/66 (!) 158/78  Pulse: 65 86 70 66  Resp: 18 20 (!) 21 20  Temp: 99.3 F (37.4 C) 98.7 F (37.1 C) 99 F (37.2 C) 98.7 F (37.1 C)  TempSrc: Oral Oral Oral Oral  SpO2: 100% 96% 97% 97%  Weight:      Height:        CBC:  Recent Labs  Lab 05/12/18 2304  05/19/18 0221 05/19/18 0504  WBC 7.5   < > 6.5 7.0  NEUTROABS 5.6  --   --   --   HGB 10.2*   < > 8.4* 8.8*  HCT 32.7*   < > 27.0* 27.7*  MCV 91.1   < > 89.4 90.2  PLT 277   < > 225 264   < > = values in this interval not displayed.    Basic Metabolic Panel:  Recent Labs  Lab 05/18/18 0048 05/19/18 0504  NA 139 139  K 3.6 3.9  CL 107 108  CO2 20* 20*  GLUCOSE 133* 137*  BUN 19 20  CREATININE 1.14* 1.07*  CALCIUM 8.5* 8.9    Lipid Panel:     Component Value Date/Time   CHOL 88 05/16/2018 0425   TRIG 77 05/16/2018 0425   HDL 38 (L) 05/16/2018 0425   CHOLHDL 2.3 05/16/2018 0425   VLDL 15 05/16/2018 0425   LDLCALC 35 05/16/2018 0425   HgbA1c:  Lab Results  Component Value Date   HGBA1C 5.6 05/16/2018    IMAGING  Dg Chest Port 1 View  Result Date: 05/19/2018 CLINICAL DATA:  Cough. EXAM: PORTABLE CHEST 1 VIEW COMPARISON:  05/17/2018. FINDINGS: Interim extubation and removal of NG tube. Mediastinum hilar structures normal. Cardiomegaly with pulmonary vascular prominence and mild interstitial prominence. Mild component CHF could not be excluded. No pleural effusion. Surgical clips right chest. IMPRESSION: 1.  Interim removal of endotracheal tube and NG tube. 2. Cardiomegaly with pulmonary venous congestion mild  interstitial prominence. A mild component CHF could not be excluded. Electronically Signed   By: [place]   On: 05/19/2018 07:26    Ct Angio Head W Or Wo Contrast Ct Angio Neck W Or Wo Contrast 05/15/2018 IMPRESSION:  1. Severely motion degraded examination.  2. Findings suspicious for proximal left M2 occlusion.  3. No evidence of large vessel occlusion more proximally.  4. Calcified plaque at the carotid bifurcations in the neck. Nondiagnostic evaluation for stenosis due to motion.   Dg Chest Port 1 View 05/15/2018 IMPRESSION:  Cardiomegaly. LEFT LOWER lobe infiltrate.   Ct Head Code Stroke Wo Contrast 05/15/2018 IMPRESSION:  1. No evidence of acute intracranial abnormality.  2. ASPECTS is 10.  3. Severe chronic small vessel ischemic disease.   Mr Brain Wo Contrast 05/16/2018 1. Small acute right frontal lobe cortical/subcortical infarct. 2. Punctate acute or early subacute left cerebellar infarct. 3. Severe chronic small vessel ischemic disease.   Transthoracic Echocardiogram  05/13/2018 - Left ventricle: The cavity size was mildly dilated. Wall thickness was increased in a pattern of mild LVH. Systolic function was severely reduced. The estimated ejection fraction was in  the range of 20% to 25%. There is akinesis of the apicalanteroseptal, anterior, and inferior myocardium. Doppler parameters are consistent with abnormal left ventricular relaxation (grade 1 diastolic dysfunction). Doppler parameters are consistent with high ventricular filling pressure. - Aortic valve: There was mild regurgitation. - Left atrium: The atrium was mildly dilated. - Right ventricle: The cavity size was mildly dilated. Systolic function was mildly to moderately reduced. - Pulmonary arteries: PA peak pressure: 45 mm Hg (S). - Pericardium, extracardiac: A trivial pericardial effusion was identified posterior to the heart.  IR 4 vessel cerebral arteriogram. RT CFA approach. 1.  Angiographically no evidence of occlusions,stenosis,intraluminal filling defects,dissections or of AV shunting. 2.Venous outflow WNLs.   PHYSICAL EXAM General - Well nourished, well developed, not in acute distress. Cardiovascular - bradycardia at times with frequent PACs on tele Neuro - awake, alert, orientated to self, people and place, time.  Able to follow simple and 3-step commands. PERL, EOMI, visual field full. Facial symmetric. Tongue midline. BUE 4+/5 and. BLE 4/5. Sensation symmetrical, coordination slow but intact finger-to-nose bilaterally and heel shin. Gait not tested.    ASSESSMENT/PLAN [name] is a 77 y.o. female with history of A. Fib (coumadin), HTN, HLD, breast cancer, NSTEMI 05/13/2018.  presenting with speech difficulties after cardiac cath. She did not receive IV t-PA due to heparin already administered.  Stroke - left cerebellar and right frontal small infarcts, embolic in nature, could be due to A. fib off Coumadin  CT head - No evidence of acute intracranial abnormality.   MRI head - left cerebellar and right frontal small infarcts, not explaining presenting symptoms  CTA H&N - Severely motion degraded examination. Findings suspicious for proximal left M2 occlusion.   Cerebral Angiogram - no evidence of occlusions or stenosis.  2D Echo - 05/13/2018 - EF 20% to 25%, concerning for Takotsubo cardiomyopathy  LDL - 35  HgbA1c 5.6  VTE prophylaxis - Eliquis  warfarin daily prior to admission, INR 1.4. changed to eliquis which was started 12/30  Therapy recommendations:  CIR  Disposition:  Plan CIR once insurance approves  Takotsubo cardiomyopathy Non-STEMI with cardiomyopathy  05/13/2018 off Coumadin for cardiac cath  Cardiac cath unremarkable  On Eliquis   EF 20 to 25% now concerning forTakotsubo cardiomyopathy  On Coreg 3.125 bid, losartan 100 daily  CXR yest w/  pulm venous congestion w/ mild intersititial prominence.Treated w/  lasix. CXR this am pulm venous congestion, mild CHF cannot be ruled out  Cardiology following, considering additional lasix or spirolactone, OP Enestro  BNP am   Weight requested by cardiology  A. fib, chronic  On Coumadin PTA  INR 2.69-2.43-1.57-1.47-1.41  Post cath  EF 20 to 25%  On Eliquis   Hypertension  Stable  On Coreg  Will resume home dose of losartan today  Hold off hydralazine at this time . BP goal normotensive  Hyperlipidemia  Lipid lowering medication PTA: Pravachol 80 mg daily  LDL 35, goal < 70  Current lipid lowering medication: Pravastatin 80 mg daily  Continue statin at discharge  Other Stroke Risk Factors  Advanced age  Obesity, Body mass index is 33.11 kg/m., recommend weight loss, diet and exercise as appropriate   Anemia, iron deficiency  Hb 9.2->8.6->7.9->8.4  Ferritin 50 iron 10  will start iron pills 325 twice daily  CBC monitoring  Dysphagia, resolved  On diet  Ensure added d/t poor intake  Other Active Problems  Anemia due to chronic disease -   Mildly elevated creatinine 1.25->1.28->1.30->1.14 ->  1.07  Cough. WBC 7.0. CXR pulm venous congestion, mild CHF cannot be rules out. Sputum cx pending. presentation is more c/w laryngitis/bronchitis vs pain post extubation  Hospital day # Bar[place], MSN, APRN, ANVP-BC, AGPCNP-BC Advanced Practice Stroke Nurse [place] for Schedule & Pager information 05/19/2018 3:56 PM   ATTENDING NOTE: I reviewed above note and agree with the assessment and plan. Pt was seen and examined.   Patient daughter and son at bedside.  Patient still has mild hoarseness and frequent coughing.  Will give Robitussin for coughing and her mild hoarseness may likely related to recent intubation.  Otherwise, patient neurologically intact.  PT/OT recommend CIR.  So far tolerating Eliquis.  We will continue on discharge.  Rosalin Hawking, MD PhD Stroke  Neurology 05/19/2018 6:26 PM    To contact Stroke Continuity provider, please refer to [email or web address] After hours, contact General Neurology

## 2018-05-19 NOTE — Progress Notes (Signed)
Nutrition Follow-up  DOCUMENTATION CODES:   Obesity unspecified  INTERVENTION:   Ensure Enlive po BID, each supplement provides 350 kcal and 20 grams of protein  NUTRITION DIAGNOSIS:   Inadequate oral intake related to inability to eat as evidenced by NPO status.  Diet advanced to heart healthy.  GOAL:   Patient will meet greater than or equal to 90% of their needs   Ongoing  MONITOR:   PO intake, Supplement acceptance, Labs, Weight trends   ASSESSMENT:   77 y/o female PMHx HTN/HLD, DM2, depression, Afib, breast cancer s/p mastectomy. [place] on her way to church and hit head. In ED, Found to have elevated troponin and suffered possible NSTEMI. Underwent LHC 12/27 to evaluated for ischemic disease. Post Procedure, code stroke called after pt developed aphasia and R facial droop. Pt intubated for arteriogram. Remains intubated afterwards.  12/30 Off Vent  12/30 Heart healthy diet with thins  Pt asleep when DI entered the room and continued to be drowsy, closing eyes and falling asleep during conversation.   Pt reported not eating well since she has been put back on a diet starting yesterday. Stated she has not had an appetite for the foods they were bringing. DI encouraged pt to selected foods that sound good to her and discussed the importance of eating to maintain muscle mass. Per the flowsheets, pt was eating around 100% on 12/25 and 12/26.   Pt amenable with receiving strawberry ensures between meals and understood the importance of eating while in the hospital. RD to continue to monitor po intake.    Medications reviewed and include: Cholecalciferol  Ferrous Sulfate  Insulin Sliding Scale Metformin  MVI  Vitamin B12  Labs reviewed:  CBGs 91, 129, 138, 104 X 24 hrs  Iron 10 (L)  Hemoglobin 8.4 (L)  HCT 27.7 (L)  Diet Order:   Diet Order            Diet Heart Room service appropriate? Yes; Fluid consistency: Thin  Diet effective now              EDUCATION  NEEDS:   Not appropriate for education at this time  Skin:  Skin Assessment: Reviewed RN Assessment  Last BM:  12/29- Type 6  Height:   Ht Readings from Last 1 Encounters:  05/15/18 5\' 7"  (1.702 m)    Weight:   Wt Readings from Last 1 Encounters:  05/16/18 95.9 kg    Ideal Body Weight:  61.36 kg  BMI:  Body mass index is 33.11 kg/m.  Estimated Nutritional Needs:   Kcal:  3762-8315 kcals/d  Protein:  95-105 g/d  Fluid:  >/= 1.7 L/d  Mauricia Area, MS, Dietetic Intern Pager: [phone] After hours Pager: [phone]

## 2018-05-19 NOTE — Progress Notes (Signed)
Physical Therapy Treatment Patient Details Name: Gloria Joseph MRN: [id number] DOB: 06/27/1940 Today's Date: 05/19/2018    History of Present Illness 77 yo female s/p fall hitting R side of head, CT head negative and MRI brain negative for any acute events on 12/25. Pt underwent a cath procedure on 12/27 and then had a stroke shortly after. MRI revealed L cerebellar and R frontal infarcts. Pt then transfered to 4n and intubated, extubated on 12/29. PMHx: includes chronic vertigo, R mastectomy, edema, Afib, DMT2, HLD, HTN, HCC, obesity, edema, depression.    PT Comments    Notable improvement, but still unsteady overall with listing posteriorly more than laterally.  Both mild retropulsion and lateral listing during gait.   Follow Up Recommendations  CIR     Equipment Recommendations  None recommended by PT    Recommendations for Other Services       Precautions / Restrictions Precautions Precautions: Fall Precaution Comments: not s/s of vertigo in 12/31 treatment    Mobility  Bed Mobility Overal bed mobility: Needs Assistance Bed Mobility: Supine to Sit   Sidelying to sit: Min assist       General bed mobility comments: pt able to scoot forward, but falls posteriorly when she releases her arms  Transfers Overall transfer level: Needs assistance Equipment used: 1 person hand held assist Transfers: Sit to/from Stand Sit to Stand: Min assist         General transfer comment: needs stability assist to keep from listing posteriorly  Ambulation/Gait Ambulation/Gait assistance: Min assist Gait Distance (Feet): 150 Feet Assistive device: 1 person hand held assist Gait Pattern/deviations: Step-through pattern   Gait velocity interpretation: 1.31 - 2.62 ft/sec, indicative of limited community ambulator General Gait Details: generally unsteady with pt listing L>R.  1 person minimal assist sufficient.   Stairs             Wheelchair Mobility    Modified  Rankin (Stroke Patients Only) Modified Rankin (Stroke Patients Only) Modified Rankin: Moderately severe disability     Balance Overall balance assessment: Needs assistance Sitting-balance support: Feet supported;Bilateral upper extremity supported Sitting balance-Leahy Scale: Poor Sitting balance - Comments: posterior lean worse than lateral listing.   Standing balance support: Single extremity supported;Bilateral upper extremity supported Standing balance-Leahy Scale: Poor                              Cognition Arousal/Alertness: Awake/alert Behavior During Therapy: Impulsive Overall Cognitive Status: Within Functional Limits for tasks assessed(impulsivity and "head strong" )                     Current Attention Level: Alternating   Following Commands: Follows one step commands with increased time Safety/Judgement: Decreased awareness of safety;Decreased awareness of deficits Awareness: Emergent          Exercises      General Comments        Pertinent Vitals/Pain Pain Assessment: No/denies pain    Home Living                      Prior Function            PT Goals (current goals can now be found in the care plan section) Acute Rehab PT Goals Patient Stated Goal: stop my legs from hurting  PT Goal Formulation: With patient Time For Goal Achievement: 06/02/18 Potential to Achieve Goals: Good Progress towards PT goals: Progressing toward  goals    Frequency    Min 3X/week      PT Plan Current plan remains appropriate    Co-evaluation              AM-PAC PT "6 Clicks" Mobility   Outcome Measure  Help needed turning from your back to your side while in a flat bed without using bedrails?: A Little Help needed moving from lying on your back to sitting on the side of a flat bed without using bedrails?: A Little Help needed moving to and from a bed to a chair (including a wheelchair)?: A Little Help needed standing up  from a chair using your arms (e.g., wheelchair or bedside chair)?: A Little Help needed to walk in hospital room?: A Little Help needed climbing 3-5 steps with a railing? : A Little 6 Click Score: 18    End of Session   Activity Tolerance: Patient limited by fatigue Patient left: with call bell/phone within reach;in chair;with chair alarm set Nurse Communication: Mobility status PT Visit Diagnosis: Unsteadiness on feet (R26.81);Other abnormalities of gait and mobility (R26.89);Difficulty in walking, not elsewhere classified (R26.2)     Time: 8299-3716 PT Time Calculation (min) (ACUTE ONLY): 28 min  Charges:  $Gait Training: 8-22 mins $Therapeutic Activity: 8-22 mins                     05/19/2018  Donnella Sham, PT Acute Rehabilitation Services [phone]  (pager) [phone]  (office)   Tessie Fass [place] 05/19/2018, 1:23 PM

## 2018-05-19 NOTE — Progress Notes (Addendum)
Progress Note  Patient Name: Gloria Joseph Date of Encounter: 05/19/2018  Primary Cardiologist: Thompson Grayer, MD  Subjective   Reports hoarseness/losing voice today and dry cough. No SOB, orthopnea, edema.  Inpatient Medications    Scheduled Meds: . apixaban  5 mg Oral BID  . carvedilol  3.125 mg Oral BID  . cholecalciferol  1,000 Units Oral Daily  . citalopram  20 mg Oral Daily  . ferrous sulfate  325 mg Oral BID WC  . insulin aspart  0-15 Units Subcutaneous TID WC  . levothyroxine  112 mcg Oral Q0600  . losartan  100 mg Oral Daily  . metFORMIN  500 mg Oral BID WC  . multivitamin with minerals  1 tablet Oral Daily  . omega-3 acid ethyl esters  1 g Oral Daily  . pravastatin  80 mg Oral Daily  . vitamin B-12  1,000 mcg Oral Daily   Continuous Infusions:  PRN Meds: acetaminophen **OR** [DISCONTINUED] acetaminophen (TYLENOL) oral liquid 160 mg/5 mL **OR** [DISCONTINUED] acetaminophen, hydrALAZINE, nitroGLYCERIN, ondansetron **OR** ondansetron (ZOFRAN) IV, senna-docusate   Vital Signs    Vitals:   05/18/18 1952 05/19/18 0001 05/19/18 0348 05/19/18 0759  BP: (!) 149/60 (!) 142/82 (!) 157/66 (!) 158/78  Pulse: 65 86 70 66  Resp: 18 20 (!) 21 20  Temp: 99.3 F (37.4 C) 98.7 F (37.1 C) 99 F (37.2 C) 98.7 F (37.1 C)  TempSrc: Oral Oral Oral Oral  SpO2: 100% 96% 97% 97%  Weight:      Height:        Intake/Output Summary (Last 24 hours) at 05/19/2018 0821 Last data filed at 05/19/2018 0602 Gross per 24 hour  Intake 360 ml  Output 1100 ml  Net -740 ml   Filed Weights   05/15/18 0300 05/15/18 2040 05/16/18 1551  Weight: 93.4 kg 94.9 kg 95.9 kg    Telemetry    NSR - Personally Reviewed  Physical Exam   GEN: No acute distress.  HEENT: Normocephalic, atraumatic, sclera non-icteric. Neck: No JVD or bruits. Cardiac: RRR no murmurs, rubs, or gallops.  Radials/DP/PT 1+ and equal bilaterally.  Respiratory: Scattered rhonchi and wheezing, no rales.  Breathing unlabored GI: Soft, nontender, non-distended, BS +x 4. MS: no deformity. Extremities: No clubbing or cyanosis. No edema. Distal pedal pulses are 2+ and equal bilaterally. Neuro:  AAOx3. Follows commands. Psych:  Responds to questions appropriately with a normal affect.  Labs    Chemistry Recent Labs  Lab 05/17/18 0532 05/18/18 0048 05/19/18 0504  NA 139 139 139  K 3.7 3.6 3.9  CL 109 107 108  CO2 22 20* 20*  GLUCOSE 123* 133* 137*  BUN 21 19 20   CREATININE 1.30* 1.14* 1.07*  CALCIUM 8.4* 8.5* 8.9  GFRNONAA 39* 46* 50*  GFRAA 46* 53* 58*  ANIONGAP 8 12 11      Hematology Recent Labs  Lab 05/18/18 0048 05/19/18 0221 05/19/18 0504  WBC 11.2* 6.5 7.0  RBC 3.03* 3.02* 3.07*  HGB 8.4* 8.4* 8.8*  HCT 27.7* 27.0* 27.7*  MCV 91.4 89.4 90.2  MCH 27.7 27.8 28.7  MCHC 30.3 31.1 31.8  RDW 15.1 14.8 15.0  PLT 210 225 264    Cardiac Enzymes Recent Labs  Lab 05/13/18 0226 05/13/18 0619 05/13/18 1234  TROPONINI 1.00* 1.46* 1.15*   No results for input(s): TROPIPOC in the last 168 hours.   BNPNo results for input(s): BNP, PROBNP in the last 168 hours.   DDimer No results for input(s): DDIMER in  the last 168 hours.   Radiology    Dg Chest Port 1 View  Result Date: 05/19/2018 CLINICAL DATA:  Cough. EXAM: PORTABLE CHEST 1 VIEW COMPARISON:  05/17/2018. FINDINGS: Interim extubation and removal of NG tube. Mediastinum hilar structures normal. Cardiomegaly with pulmonary vascular prominence and mild interstitial prominence. Mild component CHF could not be excluded. No pleural effusion. Surgical clips right chest. IMPRESSION: 1.  Interim removal of endotracheal tube and NG tube. 2. Cardiomegaly with pulmonary venous congestion mild interstitial prominence. A mild component CHF could not be excluded. Electronically Signed   By: Marcello Moores  Register   On: 05/19/2018 07:26    Patient Profile     77 y.o. female with hypertension, hyperlipidemia, diabetes mellitus,  hypothyroidism, depression,paroxysmalatrial fibrillation on Coumadin, bradycardia, chronic anemia, CKD II, breast cancer (right mastectomy)and memory issuewhopresented after mechanical fall x2 and dizziness. Troponin was checked and was elevated at peak 1.57. 2D echo 05/13/18 showed severely reduced LVEF of 20-25%, so Coumadin was held and she underwent cath 12/27 showing no significant CAD, suspected Takotsubo cardiomyopathy. She suffered a post-cath stroke with aphasia - initial scan concerning for prox LM2 occlusion; went to IR for cerebral angiogram without occlusion or stenosis. Subsequent MRI head showed left cerebellar and right frontal small infarcts, not explaining presenting symptoms. Neurology recommended to start Eliquis.  Assessment & Plan    1. Takotsubo cardiomyopathy - cath without significant CAD. Question mechanical fall as inciting event. She is on carvedilol and losartan. Cardiology note yesterday indicated may not a candidate for ARB or Entresto since previous ? angioedema on lisinopril but this was restarted yesterday without consequence. Can consider changing to [facility] although if truly stress-induced CM, would expect fairly rapid improvement in LVEF. This can be reconsidered as OP - would probably not want to drop BP too low post-stroke. CXR done yesterday for cough shows pulmonary venous congestion with mild interstitial prominence. She received 20mg  IV Lasix x 1. Denies SOB but this AM her presentation is more c/w laryngitis/bronchitis. Will review medical therapy with Dr. Claiborne Billings to include either additional Lasix or addition of spironolactone. Check BNP with AM labs. Will request weights today (not weighed since 12/28).  2. Stroke - being managed by neuro team, now on Eliquis.  3. Paroxysmal atrial fib - maintaining NSR. Continue Eliquis.  4. Anemia - Hgb has been in the 8-10 range since admission. Further per primary team.  Being evaluated for CIR.  For questions or  updates, please contact [place] Please consult [email or web address] for contact info under Cardiology/STEMI.  Signed, Charlie Pitter, PA-C 05/19/2018, 8:21 AM     Patient seen and examined. Agree with assessment and plan. Feels better. Has horseness suggestive of laryngitis; better with cough syrup.  ? H/o angioedema on lisinopril.  Was  restarted on losartan which she apparently had tolerated in past.  Will add low dose aldosterone blockade with spironolactone 12.5 mg. F/u Bmet in am and BNP.   Troy Sine, MD, [facility] 05/19/2018 7:30 PM

## 2018-05-19 NOTE — Progress Notes (Addendum)
Has developed a cough this shift. Started PO diet today but per patient she has not aspirated. Has developed a slight white count of 11.2 as of this morning, but has no fever. Cough is slightly wet. No sore throat. O2 sat of 96%. Does not endorse chest pain or SOB. Of note, EF on her echocardiogram is 20-25%  Exam:  General: NAD Lungs: Rhonchi anteriorly and posteriorly bilateral lung fields. Question of some crackles at bases. Wet cough noted during exam. Also with hoarse voice.   Neuro: Awake and alert. Speech intact. Able to follow commands.   A/R: 77 year old female with atrial fibrillation and acute embolic strokes, now with development of cough with slightly increased white count, but no fever, chest pain or decrease in O2 sat.  --  Obtaining CXR to assess for possible aspiration pneumonia versus lobar pneumonia versus CHF exacerbation. --  Repeat CBC now --  Respiratory consult to obtain sputum sample for culture --  Continue to observe with possible change to plan following results of the above. --  Continue Eliquis. As she is anticoagulated, likelihood of PE as etiology for her cough is felt to be low.   Electronically signed: Dr. Kerney Elbe

## 2018-05-19 NOTE — Discharge Instructions (Signed)

## 2018-05-20 LAB — BASIC METABOLIC PANEL
Anion gap: 9 (ref 5–15)
BUN: 17 mg/dL (ref 8–23)
CO2: 24 mmol/L (ref 22–32)
Calcium: 9 mg/dL (ref 8.9–10.3)
Chloride: 103 mmol/L (ref 98–111)
Creatinine, Ser: 1.05 mg/dL — ABNORMAL HIGH (ref 0.44–1.00)
GFR calc non Af Amer: 51 mL/min — ABNORMAL LOW (ref >60)
GFR, EST AFRICAN AMERICAN: 59 mL/min — AB (ref >60)
Glucose, Bld: 140 mg/dL — ABNORMAL HIGH (ref 70–99)
Potassium: 3.4 mmol/L — ABNORMAL LOW (ref 3.5–5.1)
Sodium: 136 mmol/L (ref 135–145)

## 2018-05-20 LAB — CBC
HCT: 25.3 % — ABNORMAL LOW (ref 36.0–46.0)
Hemoglobin: 7.8 g/dL — ABNORMAL LOW (ref 12.0–15.0)
MCH: 27.5 pg (ref 26.0–34.0)
MCHC: 30.8 g/dL (ref 30.0–36.0)
MCV: 89.1 fL (ref 80.0–100.0)
Platelets: 251 10*3/uL (ref 150–400)
RBC: 2.84 MIL/uL — ABNORMAL LOW (ref 3.87–5.11)
RDW: 14.6 % (ref 11.5–15.5)
WBC: 5.6 10*3/uL (ref 4.0–10.5)
nRBC: 0 % (ref 0.0–0.2)

## 2018-05-20 LAB — GLUCOSE, CAPILLARY
GLUCOSE-CAPILLARY: 140 mg/dL — AB (ref 70–99)
Glucose-Capillary: 113 mg/dL — ABNORMAL HIGH (ref 70–99)
Glucose-Capillary: 144 mg/dL — ABNORMAL HIGH (ref 70–99)
Glucose-Capillary: 113 mg/dL — ABNORMAL HIGH (ref 70–99)

## 2018-05-20 LAB — BRAIN NATRIURETIC PEPTIDE: B Natriuretic Peptide: 864.1 pg/mL — ABNORMAL HIGH (ref 0.0–100.0)

## 2018-05-20 MED ORDER — POTASSIUM CHLORIDE CRYS ER 20 MEQ PO TBCR
40.0000 meq | EXTENDED_RELEASE_TABLET | ORAL | Status: AC
  Administered 2018-05-20 (×2): 40 meq via ORAL
  Filled 2018-05-20 (×2): qty 2

## 2018-05-20 MED ORDER — BISACODYL 10 MG RE SUPP
10.0000 mg | Freq: Every day | RECTAL | Status: DC | PRN
  Administered 2018-05-20: 10 mg via RECTAL
  Filled 2018-05-20: qty 1

## 2018-05-20 NOTE — Progress Notes (Addendum)
STROKE TEAM PROGRESS NOTE   SUBJECTIVE (INTERVAL HISTORY) Patient lying in bed.  She is anxious to get up and walk.  She feels this will help her get better.  She is medically ready for discharge to rehab once bed is available.  The cough and sore throat she complained of yesterday is improving.  Today, she is constipated.  No relief from Senokot.  Will add to schedule suppository.   OBJECTIVE Vitals:   05/19/18 2001 05/20/18 0001 05/20/18 0409 05/20/18 0830  BP: (!) 165/64 (!) 170/56 (!) 170/72 (!) 175/56  Pulse: 66 (!) 59 60 (!) 54  Resp: (!) 21 20 (!) 21 18  Temp: 99.5 F (37.5 C) 99.7 F (37.6 C) 98.3 F (36.8 C) 98.7 F (37.1 C)  TempSrc: Oral Oral Oral Oral  SpO2: 97% 96% 95% 98%  Weight:   100.7 kg   Height:        CBC:  Recent Labs  Lab 05/19/18 0504 05/20/18 0451  WBC 7.0 5.6  HGB 8.8* 7.8*  HCT 27.7* 25.3*  MCV 90.2 89.1  PLT 264 381    Basic Metabolic Panel:  Recent Labs  Lab 05/19/18 0504 05/20/18 0451  NA 139 136  K 3.9 3.4*  CL 108 103  CO2 20* 24  GLUCOSE 137* 140*  BUN 20 17  CREATININE 1.07* 1.05*  CALCIUM 8.9 9.0    Lipid Panel:     Component Value Date/Time   CHOL 88 05/16/2018 0425   TRIG 77 05/16/2018 0425   HDL 38 (L) 05/16/2018 0425   CHOLHDL 2.3 05/16/2018 0425   VLDL 15 05/16/2018 0425   LDLCALC 35 05/16/2018 0425   HgbA1c:  Lab Results  Component Value Date   HGBA1C 5.6 05/16/2018    IMAGING  No results found.  Ct Angio Head W Or Wo Contrast Ct Angio Neck W Or Wo Contrast 05/15/2018 IMPRESSION:  1. Severely motion degraded examination.  2. Findings suspicious for proximal left M2 occlusion.  3. No evidence of large vessel occlusion more proximally.  4. Calcified plaque at the carotid bifurcations in the neck. Nondiagnostic evaluation for stenosis due to motion.   Dg Chest Port 1 View 05/15/2018 IMPRESSION:  Cardiomegaly. LEFT LOWER lobe infiltrate.   Ct Head Code Stroke Wo Contrast 05/15/2018 IMPRESSION:   1. No evidence of acute intracranial abnormality.  2. ASPECTS is 10.  3. Severe chronic small vessel ischemic disease.   Mr Brain Wo Contrast 05/16/2018 1. Small acute right frontal lobe cortical/subcortical infarct. 2. Punctate acute or early subacute left cerebellar infarct. 3. Severe chronic small vessel ischemic disease.   Transthoracic Echocardiogram  05/13/2018 - Left ventricle: The cavity size was mildly dilated. Wall thickness was increased in a pattern of mild LVH. Systolic function was severely reduced. The estimated ejection fraction was in the range of 20% to 25%. There is akinesis of the apicalanteroseptal, anterior, and inferior myocardium. Doppler parameters are consistent with abnormal left ventricular relaxation (grade 1 diastolic dysfunction). Doppler parameters are consistent with high ventricular filling pressure. - Aortic valve: There was mild regurgitation. - Left atrium: The atrium was mildly dilated. - Right ventricle: The cavity size was mildly dilated. Systolic function was mildly to moderately reduced. - Pulmonary arteries: PA peak pressure: 45 mm Hg (S). - Pericardium, extracardiac: A trivial pericardial effusion was identified posterior to the heart.  IR 4 vessel cerebral arteriogram. RT CFA approach. 1. Angiographically no evidence of occlusions,stenosis,intraluminal filling defects,dissections or of AV shunting. 2.Venous outflow WNLs.   PHYSICAL  EXAM General - Well nourished, well developed, not in acute distress. Mild voice hoarseness. Neuro - awake, alert, orientated to self, people and place, time.  Able to follow simple and 3-step commands. No aphasia. PERL, EOMI, visual field full. Facial symmetric. Tongue midline. BUE 4+/5 and. BLE 4+/5. Sensation symmetrical, coordination slow but intact finger-to-nose bilaterally and heel shin. Gait not tested.    ASSESSMENT/PLAN Gloria Joseph is a 78 y.o. female with history of A. Fib (coumadin), HTN,  HLD, breast cancer, NSTEMI 05/13/2018.  presenting with speech difficulties after cardiac cath. She did not receive IV t-PA due to heparin already administered.  Stroke - left cerebellar and right frontal small infarcts, embolic in nature, could be due to A. fib off Coumadin  CT head - No evidence of acute intracranial abnormality.   MRI head - left cerebellar and right frontal small infarcts, not explaining presenting symptoms  CTA H&N - Severely motion degraded examination. Findings suspicious for proximal left M2 occlusion.   Cerebral Angiogram - no evidence of occlusions or stenosis.  2D Echo - 05/13/2018 - EF 20% to 25%, concerning for Takotsubo cardiomyopathy  LDL - 35  HgbA1c 5.6  VTE prophylaxis - Eliquis  warfarin daily prior to admission, INR 1.4. changed to eliquis which was started 12/30  Therapy recommendations:  CIR  Disposition:  Await CIR bed, insurance approval  Takotsubo cardiomyopathy Non-STEMI with cardiomyopathy  05/13/2018 off Coumadin for cardiac cath  Cardiac cath unremarkable  On Eliquis   EF 20 to 25% now concerning forTakotsubo cardiomyopathy  On Coreg 3.125 bid, losartan 100 daily  CXR yest w/  pulm venous congestion w/ mild intersititial prominence.Treated w/ lasix. CXR this am pulm venous congestion, mild CHF cannot be ruled out  Cardiology following  BNP 661.5->864.1  A. fib, chronic  On Coumadin PTA  INR 2.69-2.43-1.57-1.47-1.41  Post cath  EF 20 to 25%  On Eliquis   Hypertension  Stable  On Coreg & losartan today  Hold off hydralazine at this time . BP goal normotensive  Hyperlipidemia  Lipid lowering medication PTA: Pravachol 80 mg daily  LDL 35, goal < 70  Current lipid lowering medication: Pravastatin 80 mg daily  Continue statin at discharge  Other Stroke Risk Factors  Advanced age  Obesity, Body mass index is 34.77 kg/m., recommend weight loss, diet and exercise as appropriate   Anemia, iron  deficiency  Hb 9.2->8.6->7.9->8.4->8.8->7.8  Ferritin 50 iron 10  On iron pills 325 twice daily  CBC monitoring  Dysphagia, resolved  On diet  Ensure added d/t poor intake  Other Active Problems  Anemia due to chronic disease -   Mildly elevated creatinine 1.25->1.28->1.30->1.14 -> 1.07->1.05  Cough. WBC 5.6. CXR pulm venous congestion, mild CHF cannot be ruled out. Sputum cx reincubated for better growth. presentation is more c/w laryngitis/bronchitis vs pain post extubation. Improving - most likely d/t vent tube removal. Some hoarseness noted.  Hospital day # [place], MSN, APRN, ANVP-BC, AGPCNP-BC Advanced Practice Stroke Nurse [place] for Schedule & Pager information 05/20/2018 10:52 AM   ATTENDING NOTE: I reviewed above note and agree with the assessment and plan. Pt was seen and examined.   No acute event overnight.  Patient still has mild hoarseness but improving, cough improved on Robitussin.  Otherwise, patient neurologically intact.  PT/OT recommend CIR.  So far tolerating Eliquis.  We will continue on discharge.  Pending for rehab beds.  Potassium was low 3.4, given supplement.  Hemoglobin dropped from 8.8-7.8.  On iron pills.  Pending stool guaiac.  Rosalin Hawking, MD PhD Stroke Neurology 05/20/2018 10:52 AM    To contact Stroke Continuity provider, please refer to [email or web address] After hours, contact General Neurology

## 2018-05-20 NOTE — Care Management Important Message (Signed)
Important Message  Patient Details  Name: Gloria Joseph MRN: [id number] Date of Birth: May 13, 1941   Medicare Important Message Given:  Yes    Barb Merino Drummond 05/20/2018, 12:40 PM

## 2018-05-20 NOTE — Discharge Summary (Addendum)
Stroke Discharge Summary  Patient ID: Gloria Joseph   MRN: [id number]      DOB: [birth year]  Date of Admission: 05/12/2018 Date of Discharge: 05/22/2018  Attending Physician:  Rosalin Hawking, MD, Stroke MD Consultant(s):   pulmonary/intensive care, Delice Lesch, MD (Physical Medicine & Rehabtilitation)  Patient's PCP:  Jani Gravel, MD  Discharge Diagnoses:  Principal Problem:   Stroke [facility]) Active Problems:   HYPERLIPIDEMIA   Essential hypertension   Hypothyroidism   Anemia   PAF (paroxysmal atrial fibrillation) ([place])   Fall   Vertigo   Abnormal EKG   Diabetes mellitus without complication (HCC)   NSTEMI (non-ST elevated myocardial infarction) ([place])   Acute metabolic encephalopathy   Elevated troponin   Dizziness   Takotsubo cardiomyopathy   Middle cerebral artery embolism, left   Diastolic dysfunction   Leukocytosis   History of breast cancer  Past Medical History:  Diagnosis Date  . Anemia 12/18/2011  . Atrial fibrillation ([place]) 04/13/2011  . Cancer [facility])    breast CA s/p Right mastectomy  . Diabetes mellitus, type 2 (HCC)    diet controlled  . Hx of echocardiogram    Echocardiogram (12/15): Moderate LVH, EF 55-60%, normal wall motion, grade 1 diastolic dysfunction, aortic sclerosis without stenosis, mild AI, MAC  . Hyperlipidemia   . Hypertension    medicine refractory  . Hypothyroidism    s/p thryroid gland ablation  . Neuromuscular disorder ([place])   . NSTEMI (non-ST elevated myocardial infarction) ([place]) 05/13/2018  . Osteopenia   . Renal insufficiency, mild 09/23/2013  . Stroke [facility]) 05/15/2018   Past Surgical History:  Procedure Laterality Date  . BREAST ENHANCEMENT SURGERY    . BREAST IMPLANT REMOVAL  2000   Unilaterally   . BREAST SURGERY    . CHOLECYSTECTOMY    . COLONOSCOPY  2002   Neg  . CORONARY ANGIOGRAPHY N/A 05/15/2018   Procedure: CORONARY ANGIOGRAPHY;  Surgeon: Burnell Blanks, MD;  Location: [place] CV LAB;  Service:  Cardiovascular;  Laterality: N/A;  . MASTECTOMY    . RADIOLOGY WITH ANESTHESIA N/A 05/15/2018   Procedure: IR WITH ANESTHESIA;  Surgeon: Luanne Bras, MD;  Location: [place];  Service: Radiology;  Laterality: N/A;  . RAI  04/04/2006  . TUBAL LIGATION      Medications to be continued at Discharge Allergies as of 05/22/2018      Reactions   Lisinopril    ? Angioedema (NOT ARB candidate) Because of a history of documented adverse serious drug reaction;Medi Alert bracelet  is recommended   Hctz [hydrochlorothiazide]    hyponatremia   Spironolactone    REACTION: low potassium ???   Penicillins Other (See Comments)   Pt states medication doesn't work for her d/t her usage of it several years ago.   Amlodipine Besylate    REACTION: edema      Medication List    STOP taking these medications   hydrALAZINE 100 MG tablet Commonly known as:  APRESOLINE   warfarin 5 MG tablet Commonly known as:  COUMADIN     TAKE these medications   apixaban 5 MG Tabs tablet Commonly known as:  ELIQUIS Take 1 tablet (5 mg total) by mouth 2 (two) times daily.   carvedilol 3.125 MG tablet Commonly known as:  COREG Take 1 tablet by mouth 2 (two) times daily.   citalopram 20 MG tablet Commonly known as:  CELEXA Take 20 mg by mouth daily.   CO Q 10 PO  Take 1 capsule by mouth daily.   feeding supplement (ENSURE ENLIVE) Liqd Take 237 mLs by mouth 2 (two) times daily between meals.   FISH OIL PO Take 1 capsule by mouth daily.   FLAXSEED OIL PO Take 1 tablet by mouth daily.   levothyroxine 112 MCG tablet Commonly known as:  SYNTHROID, LEVOTHROID Take 1 tablet (112 mcg) by mouth daily. EXCEPT Tuesdays and Thursdays take 1/2 tablet (56 mcg) by mouth daily.   losartan 100 MG tablet Commonly known as:  COZAAR Take 100 mg by mouth daily.   metFORMIN 1000 MG tablet Commonly known as:  GLUCOPHAGE Take 500 mg by mouth 2 (two) times daily.   MULTIVITAMINS Chew Chew 1 tablet by mouth  daily.   nitroGLYCERIN 0.4 MG SL tablet Commonly known as:  NITROSTAT Place 1 tablet (0.4 mg total) under the tongue every 5 (five) minutes as needed for chest pain.   pravastatin 80 MG tablet Commonly known as:  PRAVACHOL Take 80 mg by mouth daily.   vitamin B-12 1000 MCG tablet Commonly known as:  CYANOCOBALAMIN Take 1,000 mcg by mouth daily.   Vitamin D-3 25 MCG (1000 UT) Caps Take 1 capsule by mouth daily.       LABORATORY STUDIES CBC    Component Value Date/Time   WBC 5.6 05/20/2018 0451   RBC 2.84 (L) 05/20/2018 0451   HGB 7.8 (L) 05/20/2018 0451   HCT 25.3 (L) 05/20/2018 0451   PLT 251 05/20/2018 0451   MCV 89.1 05/20/2018 0451   MCH 27.5 05/20/2018 0451   MCHC 30.8 05/20/2018 0451   RDW 14.6 05/20/2018 0451   LYMPHSABS 1.2 05/12/2018 2304   MONOABS 0.4 05/12/2018 2304   EOSABS 0.1 05/12/2018 2304   BASOSABS 0.1 05/12/2018 2304   CMP    Component Value Date/Time   NA 137 05/22/2018 0551   K 3.9 05/22/2018 0551   CL 104 05/22/2018 0551   CO2 25 05/22/2018 0551   GLUCOSE 124 (H) 05/22/2018 0551   BUN 9 05/22/2018 0551   CREATININE 1.06 (H) 05/22/2018 0551   CALCIUM 9.0 05/22/2018 0551   PROT 7.6 09/23/2013 1154   ALBUMIN 4.1 09/23/2013 1154   AST 22 09/23/2013 1154   ALT 17 09/23/2013 1154   ALKPHOS 63 09/23/2013 1154   BILITOT 1.1 09/23/2013 1154   GFRNONAA 50 (L) 05/22/2018 0551   GFRAA 58 (L) 05/22/2018 0551   COAGS Lab Results  Component Value Date   INR 1.41 05/17/2018   INR 1.47 05/16/2018   INR 1.51 05/15/2018   Lipid Panel    Component Value Date/Time   CHOL 88 05/16/2018 0425   TRIG 77 05/16/2018 0425   HDL 38 (L) 05/16/2018 0425   CHOLHDL 2.3 05/16/2018 0425   VLDL 15 05/16/2018 0425   LDLCALC 35 05/16/2018 0425   HgbA1C  Lab Results  Component Value Date   HGBA1C 5.6 05/16/2018    SIGNIFICANT DIAGNOSTIC STUDIES Ct Head Code Stroke Wo Contrast 05/15/2018 1. No evidence of acute intracranial abnormality.  2. ASPECTS is  10.  3. Severe chronic small vessel ischemic disease.   Ct Angio Head W Or Wo Contrast Ct Angio Neck W Or Wo Contrast 05/15/2018 1. Severely motion degraded examination.  2. Findings suspicious for proximal left M2 occlusion.  3. No evidence of large vessel occlusion more proximally.  4. Calcified plaque at the carotid bifurcations in the neck. Nondiagnostic evaluation for stenosis due to motion.   IR 4 vessel cerebral arteriogram. RT CFA approach.  1. Angiographically no evidence of occlusions,stenosis,intraluminal filling defects,dissections or of AV shunting. 2.Venous outflow WNLs.  Mr Brain Wo Contrast 05/16/2018 1. Small acute right frontal lobe cortical/subcortical infarct. 2. Punctate acute or early subacute left cerebellar infarct. 3. Severe chronic small vessel ischemic disease.   Transthoracic Echocardiogram  05/13/2018 - Left ventricle: The cavity size was mildly dilated. Wallthickness was increased in a pattern of mild LVH. Systolicfunction was severely reduced. The estimated ejection fractionwas in the range of 20% to 25%. There is akinesis of theapicalanteroseptal, anterior, and inferior myocardium. Dopplerparameters are consistent with abnormal left ventricularrelaxation (grade 1 diastolic dysfunction). Doppler parametersare consistent with high ventricular filling pressure. - Aortic valve: There was mild regurgitation. - Left atrium: The atrium was mildly dilated. - Right ventricle: The cavity size was mildly dilated. Systolicfunction was mildly to moderately reduced. - Pulmonary arteries: PA peak pressure: 45 mm Hg (S). - Pericardium, extracardiac: A trivial pericardial effusion wasidentified posterior to the heart.  Dg Chest Port 1 View 05/15/2018 Cardiomegaly. LEFT LOWER lobe infiltrate.  05/17/2018 1. Support apparatus. 2. Patchy multifocal interstitial and airspace disease, most confluent in the left lower lobe, concerning for multilobar  bronchopneumonia. Aeration has slightly improved compared to the prior examination. 3. Small left pleural effusion. 05/19/2018 1.  Interim removal of endotracheal tube and NG tube. 2. Cardiomegaly with pulmonary venous congestion mild interstitial prominence. A mild component CHF could not be excluded.  Dg Abd Portable 1V 05/16/2018 Orogastric tube in the upper abdomen and likely in the gastric body based on the known hiatal hernia.     HISTORY OF PRESENT ILLNESS Gloria Joseph is an 78 y.o. female with PMH  A. Fib (coumadin), HTN, HLD, breast cancer, NSTEMI 05/13/2018. No prior stroke history. Patient has been off coumadin since 05/13/18. Patient originally admitted for NSTEMI on 12/25. Today patient had a cardiac cath and returned completely normal. RN noticed a sudden onset of speech difficulty and slurred speech, LKW 05/15/2018 at 1715. Code stroke was initiated. CTH: no hemorrhage. 3 mg ativan given for combative and agitation. CTA: proximal M2 occlusion. tPA was not considered due to heparin received during cardiac cath. Cerebral angio showed by evidence of occlusions or stenosis.  Modified Rankin: Rankin Score=1. NIHSS:11  DISCHARGE DIAGNOSES Stroke - left cerebellar and right frontal small infarcts, embolic in nature, could be due to A. fib off Coumadin  CT head - No evidence of acute intracranial abnormality.   MRI head - left cerebellar and right frontal small infarcts, not explaining presenting symptoms  CTA H&N - Severely motion degraded examination. Findings suspicious for proximal left M2 occlusion.   Cerebral Angiogram - no evidence of occlusions or stenosis.  2D Echo - 05/13/2018 - EF 20% to 25%, concerning for Takotsubo cardiomyopathy  LDL - 35  HgbA1c 5.6  warfarin daily prior to admission, INR 1.4. changed to eliquis which was started 12/30  Therapy recommendations:  CIR   Disposition:  home - given continued improvement and high daily co-pays, pt has opted to  d/c hom  Will have case management set up home health therapy with transition to OP therapy as needed for next week  Takotsubo cardiomyopathy Non-STEMI with cardiomyopathy  05/13/2018 off Coumadin for cardiac cath  Cardiac cath unremarkable  On Eliquis   EF 20 to 25% now concerning forTakotsubo cardiomyopathy  BNP 661.5->864.1  On Coreg 3.125 bid, losartan 100 daily  Cardiology recommends continuation of Coreg and losartan as above, as needed Lasix for dyspnea or edema  Paroxysmal atrial fibrillation  On Coumadin PTA  INR 2.69-2.43-1.57-1.47-1.41  Post cath  EF 20 to 25%  On Eliquis   Cardiology agrees with continuation  Hypertension  Stable  On Coreg 3.125 bid, losartan 100 daily  Cardiology recommends continuation of Coreg and losartan as above, as needed Lasix for dyspnea or edema  Hold off hydralazine at this time  BP goal normotensive  Hyperlipidemia  Lipid lowering medication PTA: Pravachol 80 mg daily  LDL 35, goal < 70  Current lipid lowering medication: Pravastatin 80 mg daily  Continue statin at discharge  Other Stroke Risk Factors  Advanced age  Obesity, Body mass index is 34.77 kg/m., recommend weight loss, diet and exercise as appropriate   Anemia, iron deficiency  Chronic anemia per pt  Hb 9.2->8.6->7.9->8.4->8.8->7.8  Ferritin 50 iron 10  On iron pills 325 twice daily  Complaints of diarrhea on iron. States she has tried it before and it does not work. She requested iron to be stopped  stool guiac pending  Dysphagia, resolved  On diet  Ensure added d/t poor intake  Other Active Problems  Mildly elevated creatinine, Resolved 1.25->1.28->1.30->1.14 -> 1.07->1.05->0.91  Cough/sore throat, resolved. WBC 5.6. CXR pulm venous congestion, mild CHF cannot be ruled out. Sputum cx consistent with normal respiratory flora. presentation and course felt to be r/t vent tube removal.    DISCHARGE EXAM Blood pressure  (!) 155/53, pulse (!) 54, temperature 97.8 F (36.6 C), resp. rate 18, height _0  (1.702 m), weight 97.2 kg, SpO2 99 %. General - Well nourished, well developed, not in acute distress. Voice hoarseness improved. Neuro - awake, alert, orientated to self, people and place, time.  Able to follow simple and 3-step commands. No aphasia. PERL, EOMI, visual field full. Facial symmetric. Tongue midline. RUE 4+/5, LUE 5/5 and. BLE 5/5. Sensation symmetrical, coordination slow but intact finger-to-nose bilaterally and heel shin. Gait not tested.   Discharge Diet  Heart healthy thin liquids  DISCHARGE PLAN  Disposition:  [place] with transition to OP therapy as needed   Eliquis (apixaban) daily for secondary stroke prevention.  Recommend ongoing risk factor control by Primary Care Physician at time of discharge from inpatient rehabilitation.  Follow-up Jani Gravel, MD in 2 weeks following discharge from rehab.  Follow-up in [facility] in 4 weeks following discharge from rehab, office to schedule an appointment.   Follow-up Dr. Allred/Dr. Ellyn Hack 3-4 weeks post discharge  35 minutes were spent preparing discharge.  Burnetta Sabin, MSN, APRN, ANVP-BC, AGPCNP-BC Advanced Practice Stroke Nurse [place] for Schedule & Pager information 05/22/2018 5:16 PM   ATTENDING NOTE: I reviewed above note and agree with the assessment and plan. Pt was seen and examined.   Patient walking with PT/OT in the hallway with walker, stable with no need of assistance.  Hoarseness and cough nearly resolved. Patient no complaints.  Ready for discharge.  Continue Eliquis and pravastatin for stroke prevention.  We will set up home PT/OT for her therapy needs. She will follow-up with stroke clinic at [facility] in 4 weeks.  Rosalin Hawking, MD PhD Stroke Neurology 05/22/2018 5:16 PM

## 2018-05-20 NOTE — Progress Notes (Addendum)
Progress Note  Patient Name: Gloria Joseph Date of Encounter: 05/20/2018  Primary Cardiologist: Thompson Grayer, MD  Subjective   Feeling well today.  No shortness of breath, orthopnea, or edema  Inpatient Medications    Scheduled Meds: . apixaban  5 mg Oral BID  . carvedilol  3.125 mg Oral BID  . cholecalciferol  1,000 Units Oral Daily  . citalopram  20 mg Oral Daily  . feeding supplement (ENSURE ENLIVE)  237 mL Oral BID BM  . ferrous sulfate  325 mg Oral BID WC  . insulin aspart  0-15 Units Subcutaneous TID WC  . levothyroxine  112 mcg Oral Q0600  . losartan  100 mg Oral Daily  . metFORMIN  500 mg Oral BID WC  . multivitamin with minerals  1 tablet Oral Daily  . omega-3 acid ethyl esters  1 g Oral Daily  . pravastatin  80 mg Oral Daily  . vitamin B-12  1,000 mcg Oral Daily   Continuous Infusions:  PRN Meds: acetaminophen **OR** [DISCONTINUED] acetaminophen (TYLENOL) oral liquid 160 mg/5 mL **OR** [DISCONTINUED] acetaminophen, guaiFENesin, hydrALAZINE, nitroGLYCERIN, ondansetron **OR** ondansetron (ZOFRAN) IV, phenol, senna-docusate   Vital Signs    Vitals:   05/19/18 2001 05/20/18 0001 05/20/18 0409 05/20/18 0830  BP: (!) 165/64 (!) 170/56 (!) 170/72 (!) 175/56  Pulse: 66 (!) 59 60 (!) 54  Resp: (!) 21 20 (!) 21 18  Temp: 99.5 F (37.5 C) 99.7 F (37.6 C) 98.3 F (36.8 C) 98.7 F (37.1 C)  TempSrc: Oral Oral Oral Oral  SpO2: 97% 96% 95% 98%  Weight:   100.7 kg   Height:        Intake/Output Summary (Last 24 hours) at 05/20/2018 0954 Last data filed at 05/20/2018 0504 Gross per 24 hour  Intake 480 ml  Output -  Net 480 ml   Filed Weights   05/15/18 2040 05/16/18 1551 05/20/18 0409  Weight: 94.9 kg 95.9 kg 100.7 kg    Telemetry    Sinus rhythm- Personally Reviewed  Physical Exam   GEN: Well nourished, well developed, in no acute distress  HEENT: normal  Neck: no JVD, carotid bruits, or masses Cardiac: RRR; no murmurs, rubs, or gallops,no edema    Respiratory:  clear to auscultation bilaterally, normal work of breathing GI: soft, nontender, nondistended, + BS MS: no deformity or atrophy  Skin: warm and dry Neuro:  Strength and sensation are intact Psych: euthymic mood, full affect   Labs    Chemistry Recent Labs  Lab 05/18/18 0048 05/19/18 0504 05/20/18 0451  NA 139 139 136  K 3.6 3.9 3.4*  CL 107 108 103  CO2 20* 20* 24  GLUCOSE 133* 137* 140*  BUN 19 20 17   CREATININE 1.14* 1.07* 1.05*  CALCIUM 8.5* 8.9 9.0  GFRNONAA 46* 50* 51*  GFRAA 53* 58* 59*  ANIONGAP 12 11 9      Hematology Recent Labs  Lab 05/19/18 0221 05/19/18 0504 05/20/18 0451  WBC 6.5 7.0 5.6  RBC 3.02* 3.07* 2.84*  HGB 8.4* 8.8* 7.8*  HCT 27.0* 27.7* 25.3*  MCV 89.4 90.2 89.1  MCH 27.8 28.7 27.5  MCHC 31.1 31.8 30.8  RDW 14.8 15.0 14.6  PLT 225 264 251    Cardiac Enzymes Recent Labs  Lab 05/13/18 1234  TROPONINI 1.15*   No results for input(s): TROPIPOC in the last 168 hours.   BNP Recent Labs  Lab 05/19/18 2013 05/20/18 0451  BNP 661.5* 864.1*     DDimer  No results for input(s): DDIMER in the last 168 hours.   Radiology    Dg Chest Port 1 View  Result Date: 05/19/2018 CLINICAL DATA:  Cough. EXAM: PORTABLE CHEST 1 VIEW COMPARISON:  05/17/2018. FINDINGS: Interim extubation and removal of NG tube. Mediastinum hilar structures normal. Cardiomegaly with pulmonary vascular prominence and mild interstitial prominence. Mild component CHF could not be excluded. No pleural effusion. Surgical clips right chest. IMPRESSION: 1.  Interim removal of endotracheal tube and NG tube. 2. Cardiomegaly with pulmonary venous congestion mild interstitial prominence. A mild component CHF could not be excluded. Electronically Signed   By: Marcello Moores  Register   On: 05/19/2018 07:26    Patient Profile     78 y.o. female with hypertension, hyperlipidemia, diabetes mellitus, hypothyroidism, depression,paroxysmalatrial fibrillation on Coumadin,  bradycardia, chronic anemia, CKD II, breast cancer (right mastectomy)and memory issuewhopresented after mechanical fall x2 and dizziness. Troponin was checked and was elevated at peak 1.57. 2D echo 05/13/18 showed severely reduced LVEF of 20-25%, so Coumadin was held and she underwent cath 12/27 showing no significant CAD, suspected Takotsubo cardiomyopathy. She suffered a post-cath stroke with aphasia - initial scan concerning for prox LM2 occlusion; went to IR for cerebral angiogram without occlusion or stenosis. Subsequent MRI head showed left cerebellar and right frontal small infarcts, not explaining presenting symptoms. Neurology recommended to start Eliquis.  Assessment & Plan    1. Takotsubo cardiomyopathy -catheterization without significant coronary disease.  Question the inciting event, but possibly a mechanical fall.  Currently on Coreg and losartan.  Blood pressure is mildly elevated.   allow neurology to titrate due to recent stroke.    2. Stroke -managed by stroke team currently on Eliquis.  3. Paroxysmal atrial fib -maintaining sinus rhythm  4. Anemia -per primary team   For questions or updates, please contact [facility] Please consult [email or web address] for contact info under Cardiology/STEMI.  Signed,  Meredith Leeds, MD 05/20/2018, 9:54 AM

## 2018-05-21 LAB — CULTURE, RESPIRATORY W GRAM STAIN: Culture: NORMAL

## 2018-05-21 LAB — BASIC METABOLIC PANEL
Anion gap: 9 (ref 5–15)
BUN: 13 mg/dL (ref 8–23)
CO2: 22 mmol/L (ref 22–32)
CREATININE: 0.91 mg/dL (ref 0.44–1.00)
Calcium: 8.8 mg/dL — ABNORMAL LOW (ref 8.9–10.3)
Chloride: 105 mmol/L (ref 98–111)
GFR calc Af Amer: >60 mL/min (ref >60)
GFR calc non Af Amer: >60 mL/min (ref >60)
Glucose, Bld: 118 mg/dL — ABNORMAL HIGH (ref 70–99)
Potassium: 4.4 mmol/L (ref 3.5–5.1)
Sodium: 136 mmol/L (ref 135–145)

## 2018-05-21 LAB — GLUCOSE, CAPILLARY
GLUCOSE-CAPILLARY: 167 mg/dL — AB (ref 70–99)
Glucose-Capillary: 117 mg/dL — ABNORMAL HIGH (ref 70–99)
Glucose-Capillary: 102 mg/dL — ABNORMAL HIGH (ref 70–99)
Glucose-Capillary: 112 mg/dL — ABNORMAL HIGH (ref 70–99)

## 2018-05-21 MED ORDER — ENSURE ENLIVE PO LIQD: 237.0000 mL | Freq: Two times a day (BID) | ORAL | 12 refills | Status: DC

## 2018-05-21 MED ORDER — APIXABAN 5 MG PO TABS: 5.0000 mg | ORAL_TABLET | Freq: Two times a day (BID) | ORAL | Status: DC

## 2018-05-21 MED ORDER — NITROGLYCERIN 0.4 MG SL SUBL: 0.4000 mg | SUBLINGUAL_TABLET | SUBLINGUAL | 12 refills | Status: DC | PRN

## 2018-05-21 MED ORDER — FERROUS SULFATE 325 (65 FE) MG PO TABS: 325.0000 mg | ORAL_TABLET | Freq: Two times a day (BID) | ORAL | 3 refills | Status: DC

## 2018-05-21 NOTE — Progress Notes (Signed)
Physical Therapy Treatment Patient Details Name: Gloria Joseph MRN: [id number] DOB: [birth year] Today's Date: 05/21/2018    History of Present Illness 78 yo female s/p fall hitting R side of head, CT head negative and MRI brain negative for any acute events on 12/25. Pt underwent a cath procedure on 12/27 and then had a stroke shortly after. MRI revealed L cerebellar and R frontal infarcts. Pt then transfered to 4n and intubated, extubated on 12/29. PMHx: includes chronic vertigo, R mastectomy, edema, Afib, DMT2, HLD, HTN, HCC, obesity, edema, depression.    PT Comments    Improving steadily.  Guarded and mildly unsteady, but now scanning around.  Drifting right consistently and occasionally running into obstacles on the right.    Follow Up Recommendations  CIR;Other (comment)(if goes soon, can benefit from the intensity)     Equipment Recommendations  None recommended by PT    Recommendations for Other Services       Precautions / Restrictions Precautions Precautions: Fall    Mobility  Bed Mobility Overal bed mobility: Needs Assistance Bed Mobility: Supine to Sit;Sit to Supine     Supine to sit: Min guard Sit to supine: Min guard      Transfers Overall transfer level: Needs assistance   Transfers: Sit to/from Stand Sit to Stand: Min guard         General transfer comment: used LE's to stabilize against the bed frame, but no assist needed  Ambulation/Gait Ambulation/Gait assistance: Min assist;Min guard Gait Distance (Feet): 110 Feet(then 130 ) Assistive device: Rolling walker (2 wheeled) Gait Pattern/deviations: Step-through pattern   Gait velocity interpretation: 1.31 - 2.62 ft/sec, indicative of limited community ambulator General Gait Details: Notably steadier, but drifting right consistently, occasionally running into the walls and stationary obstacles.     Stairs             Wheelchair Mobility    Modified Rankin (Stroke Patients  Only) Modified Rankin (Stroke Patients Only) Pre-Morbid Rankin Score: No significant disability Modified Rankin: Moderately severe disability     Balance Overall balance assessment: Needs assistance   Sitting balance-Leahy Scale: Fair       Standing balance-Leahy Scale: Fair Standing balance comment: static without AD                            Cognition Arousal/Alertness: Awake/alert Behavior During Therapy: WFL for tasks assessed/performed Overall Cognitive Status: Within Functional Limits for tasks assessed                     Current Attention Level: Alternating   Following Commands: Follows one step commands consistently Safety/Judgement: Decreased awareness of safety;Decreased awareness of deficits Awareness: Emergent Problem Solving: Slow processing        Exercises      General Comments        Pertinent Vitals/Pain Pain Assessment: No/denies pain    Home Living                      Prior Function            PT Goals (current goals can now be found in the care plan section) Acute Rehab PT Goals PT Goal Formulation: With patient Time For Goal Achievement: 06/02/18 Potential to Achieve Goals: Good Progress towards PT goals: Progressing toward goals    Frequency    Min 3X/week      PT Plan Current plan remains appropriate  Co-evaluation              AM-PAC PT "6 Clicks" Mobility   Outcome Measure  Help needed turning from your back to your side while in a flat bed without using bedrails?: A Little Help needed moving from lying on your back to sitting on the side of a flat bed without using bedrails?: A Little Help needed moving to and from a bed to a chair (including a wheelchair)?: A Little Help needed standing up from a chair using your arms (e.g., wheelchair or bedside chair)?: A Little Help needed to walk in hospital room?: A Little Help needed climbing 3-5 steps with a railing? : A Little 6 Click  Score: 18    End of Session   Activity Tolerance: Patient tolerated treatment well Patient left: in bed;with call bell/phone within reach Nurse Communication: Mobility status PT Visit Diagnosis: Unsteadiness on feet (R26.81);Other abnormalities of gait and mobility (R26.89)     Time: 0964-3838 PT Time Calculation (min) (ACUTE ONLY): 14 min  Charges:  $Gait Training: 8-22 mins                     05/21/2018  Donnella Sham, [place] [phone]  (pager) [phone]  (office)   Tessie Fass Jaretzy Lhommedieu 05/21/2018, 5:55 PM

## 2018-05-21 NOTE — H&P (Signed)
Physical Medicine and Rehabilitation Admission H&P    Chief Complaint  Patient presents with  .  Functional deficits due to stroke    HPI: Gloria Joseph is a 78 year old female with history of HTN, T2DM, chronic combined CHF, A. fib--on Coumadin, breast cancer, anemia; who was admitted on 05/13/2018 after sustaining a fall on the sidewalk.  Patient struck her head with reports of mild left sided headache and while in ED patient developed vertigo.  CT head negative for acute changes.  MRI limited due to motion but was negative for acute changes.  EKG done revealing ST changes in lateral leads and 2D echo showed severely reduced EF 20 to 25%--appearance somewhat concerning for Takotsubo type  cardiomyopathy.  She was placed on IV heparin and cardiology consulted for input. They questioned questioned true NSTEMI versus demand ischemia and patient underwent cardiac cath for work up of cardiomyopathy. Cath 12/27 reveled normal coronaries and post procedure patient developed speech difficulty.   Code stroke initiated and CTA head neck was suspicious for proximal M2 occlusion. No tPA as patient already on IV heparin and she underwent cerebral angiogram that was negative for occlusions, stenosis or filling defects. MRI brain done revealing small acute right frontal lobe cortical/subcortical infarct with acute/early subacute left cerebellar infarct and severe small vessel disease. Elevated BP  treated with clevidipine and she was received a dose of lasix due to concerns of overload. Cardiology recommends lasix prn to manage symptoms. Stroke felt to be embolic due to being off coumadin and she was switched to Eliquis. She has developed cough with leucocytosis has resolved and iron supplement added due to anemia.  Patient with balance deficits as well as poor safety awareness affecting ADLs and mobility. CIR recommended due to functional decline.    Review of Systems  Constitutional: Negative for chills  and fever.  HENT: Negative for hearing loss and tinnitus.   Eyes: Negative for blurred vision and double vision.  Respiratory: Positive for cough (getting better). Negative for shortness of breath.   Cardiovascular: Negative for chest pain and palpitations.  Gastrointestinal: Negative for abdominal pain, heartburn and nausea.  Genitourinary: Positive for hematuria (recent episode--"bladder full of clots"). Negative for dysuria and urgency.  Musculoskeletal: Negative for back pain and myalgias.  Skin: Negative for rash.  Neurological: Positive for speech change (volume improving. ) and weakness. Negative for dizziness and headaches.  Psychiatric/Behavioral: The patient is not nervous/anxious and does not have insomnia.      Past Medical History:  Diagnosis Date  . Anemia 12/18/2011  . Atrial fibrillation ([place]) 04/13/2011  . Cancer [facility])    breast CA s/p Right mastectomy  . Diabetes mellitus, type 2 (HCC)    diet controlled  . Hx of echocardiogram    Echocardiogram (12/15): Moderate LVH, EF 55-60%, normal wall motion, grade 1 diastolic dysfunction, aortic sclerosis without stenosis, mild AI, MAC  . Hyperlipidemia   . Hypertension    medicine refractory  . Hypothyroidism    s/p thryroid gland ablation  . Neuromuscular disorder ([place])   . NSTEMI (non-ST elevated myocardial infarction) ([place]) 05/13/2018  . Osteopenia   . Renal insufficiency, mild 09/23/2013  . Stroke [facility]) 05/15/2018    Past Surgical History:  Procedure Laterality Date  . BREAST ENHANCEMENT SURGERY    . BREAST IMPLANT REMOVAL  2000   Unilaterally   . BREAST SURGERY    . CHOLECYSTECTOMY    . COLONOSCOPY  2002   Neg  . CORONARY ANGIOGRAPHY N/A  05/15/2018   Procedure: CORONARY ANGIOGRAPHY;  Surgeon: Burnell Blanks, MD;  Location: [facility];  Service: Cardiovascular;  Laterality: N/A;  . MASTECTOMY    . RADIOLOGY WITH ANESTHESIA N/A 05/15/2018   Procedure: IR WITH ANESTHESIA;  Surgeon: Luanne Bras, MD;  Location: [facility];  Service: Radiology;  Laterality: N/A;  . RAI  04/04/2006  . TUBAL LIGATION      Family History  Problem Relation Age of Onset  . Depression Mother   . Alcohol abuse Father   . Heart attack Brother 56  . Cancer Other        Uncle, not specific  . Cancer Other        Aunt, not specific. GYN CA  . Breast cancer Other        Aunt, not specific  . Diabetes Other        Grandmother, not specific  . Cancer Maternal Grandfather        colon    Social History:  Lives with her son. Retired Social worker. Very active---involved in multiple different activities. She  reports that she has never smoked. She has never used smokeless tobacco. She reports that she does not drink alcohol or use drugs.    Allergies  Allergen Reactions  . Lisinopril     ? Angioedema (NOT ARB candidate) Because of a history of documented adverse serious drug reaction;Medi Alert bracelet  is recommended  . Hctz [Hydrochlorothiazide]     hyponatremia  . Spironolactone     REACTION: low potassium ???  . Penicillins Other (See Comments)    Pt states medication doesn't work for her d/t her usage of it several years ago.  . Amlodipine Besylate     REACTION: edema    Medications Prior to Admission  Medication Sig Dispense Refill  . carvedilol (COREG) 3.125 MG tablet Take 1 tablet by mouth 2 (two) times daily.    . Cholecalciferol (VITAMIN D-3) 1000 UNITS CAPS Take 1 capsule by mouth daily.    . citalopram (CELEXA) 20 MG tablet Take 20 mg by mouth daily.    . Coenzyme Q10 (CO Q 10 PO) Take 1 capsule by mouth daily.    . Flaxseed, Linseed, (FLAXSEED OIL PO) Take 1 tablet by mouth daily.    . hydrALAZINE (APRESOLINE) 100 MG tablet Take 150 mg by mouth 2 (two) times daily.    Marland Kitchen levothyroxine (SYNTHROID, LEVOTHROID) 112 MCG tablet Take 1 tablet (112 mcg) by mouth daily. EXCEPT Tuesdays and Thursdays take 1/2 tablet (56 mcg) by mouth daily.    Marland Kitchen losartan (COZAAR) 100 MG tablet Take  100 mg by mouth daily.    . metFORMIN (GLUCOPHAGE) 1000 MG tablet Take 500 mg by mouth 2 (two) times daily.    . Multiple Vitamins-Minerals (MULTIVITAMINS) CHEW Chew 1 tablet by mouth daily.    . Omega-3 Fatty Acids (FISH OIL PO) Take 1 capsule by mouth daily.    . pravastatin (PRAVACHOL) 80 MG tablet Take 80 mg by mouth daily.     . vitamin B-12 (CYANOCOBALAMIN) 1000 MCG tablet Take 1,000 mcg by mouth daily.    Marland Kitchen warfarin (COUMADIN) 5 MG tablet TAKE 1 TO 1 AND 1/2 TABLET  BY MOUTH DAILY OR AS  DIRECTED (Patient taking differently: Take 2.5-5 mg by mouth daily. Take 2.5 mg (5 mg x 0.5) by mouth every Fri; and take 5 mg (5 mg x 1) all other days) 105 tablet 0    Drug Regimen Review  Drug regimen was reviewed and remains appropriate with no significant issues identified  Home: Home Living Family/patient expects to be discharged to:: Inpatient rehab Living Arrangements: Children Available Help at Discharge: Family Type of Home: House Home Access: Level entry Home Layout: Multi-level, Full bath on main level, Able to live on main level with bedroom/bathroom Bathroom Shower/Tub: Chiropodist: Standard Home Equipment: Environmental consultant - 2 wheels, Cane - single point, Bedside commode, Shower seat, Grab bars - toilet, Grab bars - tub/shower, Hand held shower head Additional Comments: Information collected from prior evaluation. Pt was at home with children and Indep PTA   Functional History: Prior Function Level of Independence: Independent with assistive device(s) Comments: pt bathes when family was present at home and drives only when not dizzy.  Functional Status:  Mobility: Bed Mobility Overal bed mobility: Needs Assistance Bed Mobility: Supine to Sit, Sit to Supine Sidelying to sit: Min assist Supine to sit: Min guard Sit to supine: Min guard General bed mobility comments: pt able to scoot forward, but falls posteriorly when she releases her arms Transfers Overall  transfer level: Needs assistance Equipment used: 1 person hand held assist Transfers: Sit to/from Stand Sit to Stand: Min guard Stand pivot transfers: Max assist, +2 physical assistance General transfer comment: used LE's to stabilize against the bed frame, but no assist needed Ambulation/Gait Ambulation/Gait assistance: Min assist, Min guard Gait Distance (Feet): 110 Feet(then 130 ) Assistive device: Rolling walker (2 wheeled) Gait Pattern/deviations: Step-through pattern General Gait Details: Notably steadier, but drifting right consistently, occasionally running into the walls and stationary obstacles.   Gait velocity interpretation: 1.31 - 2.62 ft/sec, indicative of limited community ambulator    ADL: ADL Overall ADL's : Needs assistance/impaired Eating/Feeding: NPO Grooming: Moderate assistance, Sitting, Oral care Grooming Details (indicate cue type and reason): Using mouth swab and rinse for oral care while seated. Despite support at seat, pt requiring Min-Mod A for sitting balance and safety with impulsive movements. Pt requiring Mod A to manage mouth rinse and increased cues to dip swab into rinse and then perform brushing motion.  Upper Body Bathing: Moderate assistance, Sitting Lower Body Bathing: Maximal assistance, Sit to/from stand, +2 for physical assistance Upper Body Dressing : Moderate assistance, Sitting Lower Body Dressing: Maximal assistance, +2 for physical assistance, Sit to/from stand Lower Body Dressing Details (indicate cue type and reason): Pt attempting to don socks at EOB, but unabel to due to dizziness. Pt requiring Max A and second person to assist with sitting balance. Pt presenting with posterior and left lateral lean sitting at EOB Toilet Transfer: Maximal assistance, +2 for physical assistance, Stand-pivot, BSC Toilet Transfer Details (indicate cue type and reason): Max A +2 for stand pivot to [facility] (turning to left). Pt with poor balance and coordination.  Pt very dizzy and becoming nauseous.  Functional mobility during ADLs: Maximal assistance, +2 for physical assistance, Cueing for sequencing, Cueing for safety(stand pivot only) General ADL Comments: Pt presenting with decreased balance, cognition, cooridnation, awareness, and vision.   Cognition: Cognition Overall Cognitive Status: Within Functional Limits for tasks assessed Arousal/Alertness: Awake/alert Orientation Level: Oriented X4 Attention: Focused, Sustained Focused Attention: Appears intact Sustained Attention: Impaired Sustained Attention Impairment: Verbal basic, Functional basic Memory: Impaired Memory Impairment: Decreased short term memory Decreased Short Term Memory: Verbal basic Awareness: Impaired Awareness Impairment: Intellectual impairment Problem Solving: Impaired Problem Solving Impairment: Functional basic Executive Function: Initiating Behaviors: Restless Safety/Judgment: Impaired Cognition Arousal/Alertness: Awake/alert Behavior During Therapy: WFL for tasks assessed/performed Overall Cognitive Status: Within  Functional Limits for tasks assessed Area of Impairment: Attention, Memory, Following commands, Safety/judgement, Awareness, Problem solving Current Attention Level: Alternating Memory: Decreased short-term memory(reports remembering last PT but unable to recall what they d) Following Commands: Follows one step commands consistently Safety/Judgement: Decreased awareness of safety, Decreased awareness of deficits Awareness: Emergent Problem Solving: Slow processing General Comments: pt indep and cognitively intact prior to stroke. Pt confused, states its 1921 and easily agitated. Most likely due to frontal infarct. Pt with difficulty following commands due to delayed processing and impaired memory. Tangiental in thoughts and requiring increased time and cues throughout session. Pt emotions quickly switches and pt tearful mutiple times during  session   Blood pressure 138/64, pulse (!) 52, temperature 97.8 F (36.6 C), resp. rate 18, height _0  (1.702 m), weight 97.2 kg, SpO2 99 %. Physical Exam  Nursing note and vitals reviewed. Constitutional: She is oriented to person, place, and time. She appears well-developed and well-nourished.  Neurological: She is alert and oriented to person, place, and time.  Speech clear. Verbose but able to follow commands without difficulty. Mild left limb ataxia. No nystagmus, visual fields intact. Cognitively appropriate. No sensory deficits. UE 5/5. LE 4/5 prox to distal.     Results for orders placed or performed during the hospital encounter of 05/12/18 (from the past 48 hour(s))  Glucose, capillary     Status: Abnormal   Collection Time: 05/20/18  4:10 PM  Result Value Ref Range   Glucose-Capillary 113 (H) 70 - 99 mg/dL   Comment 1 Notify RN    Comment 2 Document in Chart   Glucose, capillary     Status: Abnormal   Collection Time: 05/20/18  9:21 PM  Result Value Ref Range   Glucose-Capillary 140 (H) 70 - 99 mg/dL   Comment 1 Notify RN    Comment 2 Document in Chart   Basic metabolic panel     Status: Abnormal   Collection Time: 05/21/18  3:50 AM  Result Value Ref Range   Sodium 136 135 - 145 mmol/L   Potassium 4.4 3.5 - 5.1 mmol/L   Chloride 105 98 - 111 mmol/L   CO2 22 22 - 32 mmol/L   Glucose, Bld 118 (H) 70 - 99 mg/dL   BUN 13 8 - 23 mg/dL   Creatinine, Ser 0.91 0.44 - 1.00 mg/dL   Calcium 8.8 (L) 8.9 - 10.3 mg/dL   GFR calc non Af Amer >60 >60 mL/min   GFR calc Af Amer >60 >60 mL/min   Anion gap 9 5 - 15    Comment: Performed at [facility], [address] [place], [place] [address]  Glucose, capillary     Status: Abnormal   Collection Time: 05/21/18  6:22 AM  Result Value Ref Range   Glucose-Capillary 117 (H) 70 - 99 mg/dL  Glucose, capillary     Status: Abnormal   Collection Time: 05/21/18 11:18 AM  Result Value Ref Range   Glucose-Capillary 167 (H) 70 -  99 mg/dL  Glucose, capillary     Status: Abnormal   Collection Time: 05/21/18  4:34 PM  Result Value Ref Range   Glucose-Capillary 102 (H) 70 - 99 mg/dL  Glucose, capillary     Status: Abnormal   Collection Time: 05/21/18  9:33 PM  Result Value Ref Range   Glucose-Capillary 112 (H) 70 - 99 mg/dL   Comment 1 Notify RN    Comment 2 Document in Chart   Basic metabolic panel  Status: Abnormal   Collection Time: 05/22/18  5:51 AM  Result Value Ref Range   Sodium 137 135 - 145 mmol/L   Potassium 3.9 3.5 - 5.1 mmol/L   Chloride 104 98 - 111 mmol/L   CO2 25 22 - 32 mmol/L   Glucose, Bld 124 (H) 70 - 99 mg/dL   BUN 9 8 - 23 mg/dL   Creatinine, Ser 1.06 (H) 0.44 - 1.00 mg/dL   Calcium 9.0 8.9 - 10.3 mg/dL   GFR calc non Af Amer 50 (L) >60 mL/min   GFR calc Af Amer 58 (L) >60 mL/min   Anion gap 8 5 - 15    Comment: Performed at [facility] [address] [place], [place] [address]  Glucose, capillary     Status: Abnormal   Collection Time: 05/22/18  6:38 AM  Result Value Ref Range   Glucose-Capillary 113 (H) 70 - 99 mg/dL   Comment 1 Notify RN    Comment 2 Document in Chart   Glucose, capillary     Status: Abnormal   Collection Time: 05/22/18 11:34 AM  Result Value Ref Range   Glucose-Capillary 149 (H) 70 - 99 mg/dL   No results found.     Medical Problem List and Plan: 1.  Functional deficits and ataxia secondary to embolic left cerebellar (and right frontal) infarcts  -admit to inpatient rehab 2.  DVT Prophylaxis/Anticoagulation: Pharmaceutical: Other (comment)--Eliquis 3. Pain Management: Tylenol prn 4. Mood: LCSW to follow for evaluation and support.  5. Neuropsych: This patient is capable of making decisions on her own behalf. 6. Skin/Wound Care: Routine pressure relief measures.  7. Fluids/Electrolytes/Nutrition: Monitor I/O. Check lytes in am.  8. HTN: Monitor BP bid--continue cozaar. 9.T2DM: Monitor BS ac/hs. Continue metformin and titrate upwards as  indicated. Will use SSI for elevated BS as indicated.  10. Combined CHF with Takotsubo CM: Monitor for signs of overload.  Check weights daily. Low salt diet. On coreg, cozaar and pravastatin. Lasix prn for dyspnea.  11. Hypothyroid: continue supplement. 12. Iron deficiency anemia:  Question acute on chronic. Has had steady decline. Monitor for signs of bleeding as on AC. Will order stool guaiacs. May need transfusion if it drops further. Continue iron supplement.     Post Admission Physician Evaluation: 1. Functional deficits secondary  to left cerebellar (right frontal) infarcts. 2. Patient is admitted to receive collaborative, interdisciplinary care between the physiatrist, rehab nursing staff, and therapy team. 3. Patient's level of medical complexity and substantial therapy needs in context of that medical necessity cannot be provided at a lesser intensity of care such as a SNF. 4. Patient has experienced substantial functional loss from his/her baseline which was documented above under the "Functional History" and "Functional Status" headings.  Judging by the patient's diagnosis, physical exam, and functional history, the patient has potential for functional progress which will result in measurable gains while on inpatient rehab.  These gains will be of substantial and practical use upon discharge  in facilitating mobility and self-care at the household level. 5. Physiatrist will provide 24 hour management of medical needs as well as oversight of the therapy plan/treatment and provide guidance as appropriate regarding the interaction of the two. 6. The Preadmission Screening has been reviewed and patient status is unchanged unless otherwise stated above. 7. 24 hour rehab nursing will assist with bladder management, bowel management, safety, skin/wound care, disease management, medication administration, pain management and patient education  and help integrate therapy concepts,  techniques,education,  etc. 8. PT will assess and treat for/with: Lower extremity strength, range of motion, stamina, balance, functional mobility, safety, adaptive techniques and equipment, NMR, family ed.   Goals are: mod I to supervision. 9. OT will assess and treat for/with: ADL's, functional mobility, safety, upper extremity strength, adaptive techniques and equipment, NMR, family ed.   Goals are: mod I to supervision. Therapy may proceed with showering this patient. 10. SLP will assess and treat for/with: n/a.  Goals are: n/a. 11. Case Management and Social Worker will assess and treat for psychological issues and discharge planning. 12. Team conference will be held weekly to assess progress toward goals and to determine barriers to discharge. 13. Patient will receive at least 3 hours of therapy per day at least 5 days per week. 14. ELOS: 5-7 days       15. Prognosis:  excellent   I have personally performed a face to face diagnostic evaluation of this patient and formulated the key components of the plan.  Additionally, I have personally reviewed laboratory data, imaging studies, as well as relevant notes and concur with the physician assistant's documentation above.  Meredith Staggers, MD, [facility] Drown    Bary Leriche, PA-C 05/22/2018

## 2018-05-21 NOTE — Progress Notes (Addendum)
STROKE TEAM PROGRESS NOTE   SUBJECTIVE (INTERVAL HISTORY) Patient lying in bed, rehab admissions coordinator just left.  Patient upset there was misunderstanding with plans for rehab.  She and her family would like short rehab stay.  Insurance approval underway. Otherwise, patient's cough and sore throat continues to resolve, almost gone today.  No other complaints other than she is tired of lying in the bed.   OBJECTIVE Vitals:   05/21/18 0042 05/21/18 0412 05/21/18 0547 05/21/18 0751  BP: (!) 157/55 (!) 180/66 (!) 166/55 (!) 124/98  Pulse: (!) 59 67 (!) 55 60  Resp: 20 20 18 18   Temp: 99.3 F (37.4 C) 99.5 F (37.5 C)  98.4 F (36.9 C)  TempSrc: Oral Oral  Oral  SpO2: 99% 98% 97% 98%  Weight:      Height:        CBC:  Recent Labs  Lab 05/19/18 0504 05/20/18 0451  WBC 7.0 5.6  HGB 8.8* 7.8*  HCT 27.7* 25.3*  MCV 90.2 89.1  PLT 264 270    Basic Metabolic Panel:  Recent Labs  Lab 05/20/18 0451 05/21/18 0350  NA 136 136  K 3.4* 4.4  CL 103 105  CO2 24 22  GLUCOSE 140* 118*  BUN 17 13  CREATININE 1.05* 0.91  CALCIUM 9.0 8.8*    Lipid Panel:     Component Value Date/Time   CHOL 88 05/16/2018 0425   TRIG 77 05/16/2018 0425   HDL 38 (L) 05/16/2018 0425   CHOLHDL 2.3 05/16/2018 0425   VLDL 15 05/16/2018 0425   LDLCALC 35 05/16/2018 0425   HgbA1c:  Lab Results  Component Value Date   HGBA1C 5.6 05/16/2018    IMAGING  No results found.  Ct Angio Head W Or Wo Contrast Ct Angio Neck W Or Wo Contrast 05/15/2018 IMPRESSION:  1. Severely motion degraded examination.  2. Findings suspicious for proximal left M2 occlusion.  3. No evidence of large vessel occlusion more proximally.  4. Calcified plaque at the carotid bifurcations in the neck. Nondiagnostic evaluation for stenosis due to motion.   Dg Chest Port 1 View 05/15/2018 IMPRESSION:  Cardiomegaly. LEFT LOWER lobe infiltrate.   Ct Head Code Stroke Wo Contrast 05/15/2018 IMPRESSION:  1. No  evidence of acute intracranial abnormality.  2. ASPECTS is 10.  3. Severe chronic small vessel ischemic disease.   Mr Brain Wo Contrast 05/16/2018 1. Small acute right frontal lobe cortical/subcortical infarct. 2. Punctate acute or early subacute left cerebellar infarct. 3. Severe chronic small vessel ischemic disease.   Transthoracic Echocardiogram  05/13/2018 - Left ventricle: The cavity size was mildly dilated. Wall thickness was increased in a pattern of mild LVH. Systolic function was severely reduced. The estimated ejection fraction was in the range of 20% to 25%. There is akinesis of the apicalanteroseptal, anterior, and inferior myocardium. Doppler parameters are consistent with abnormal left ventricular relaxation (grade 1 diastolic dysfunction). Doppler parameters are consistent with high ventricular filling pressure. - Aortic valve: There was mild regurgitation. - Left atrium: The atrium was mildly dilated. - Right ventricle: The cavity size was mildly dilated. Systolic function was mildly to moderately reduced. - Pulmonary arteries: PA peak pressure: 45 mm Hg (S). - Pericardium, extracardiac: A trivial pericardial effusion was identified posterior to the heart.  IR 4 vessel cerebral arteriogram. RT CFA approach. 1. Angiographically no evidence of occlusions,stenosis,intraluminal filling defects,dissections or of AV shunting. 2.Venous outflow WNLs.   PHYSICAL EXAM General - Well nourished, well developed, not in acute  distress. Voice hoarseness improved. Neuro - awake, alert, orientated to self, people and place, time.  Able to follow simple and 3-step commands. No aphasia. PERL, EOMI, visual field full. Facial symmetric. Tongue midline. RUE 4+/5, LUE 5/5 and. BLE 5/5. Sensation symmetrical, coordination slow but intact finger-to-nose bilaterally and heel shin. Gait not tested.    ASSESSMENT/PLAN [name] is a 78 y.o. female with history of A. Fib (coumadin), HTN,  HLD, breast cancer, NSTEMI 05/13/2018.  presenting with speech difficulties after cardiac cath. She did not receive IV t-PA due to heparin already administered.  Stroke - left cerebellar and right frontal small infarcts, embolic in nature, could be due to A. fib off Coumadin  CT head - No evidence of acute intracranial abnormality.   MRI head - left cerebellar and right frontal small infarcts, not explaining presenting symptoms  CTA H&N - Severely motion degraded examination. Findings suspicious for proximal left M2 occlusion.   Cerebral Angiogram - no evidence of occlusions or stenosis.  2D Echo - 05/13/2018 - EF 20% to 25%, concerning for Takotsubo cardiomyopathy  LDL - 35  HgbA1c 5.6  VTE prophylaxis - Eliquis  warfarin daily prior to admission, INR 1.4. changed to eliquis which was started 12/30  Therapy recommendations:  CIR  Disposition:  Await CIR bed, insurance approval  Takotsubo cardiomyopathy Non-STEMI with cardiomyopathy  05/13/2018 off Coumadin for cardiac cath  Cardiac cath unremarkable  On Eliquis   EF 20 to 25% now concerning forTakotsubo cardiomyopathy  BNP 661.5->864.1  On Coreg 3.125 bid, losartan 100 daily  Cardiology recommends continuation of Coreg and losartan as above, as needed Lasix for dyspnea or edema  A. fib, chronic  On Coumadin PTA  INR 2.69-2.43-1.57-1.47-1.41  Post cath  EF 20 to 25%  On Eliquis   Cardiology agrees with continuation  Hypertension  Stable  On Coreg 3.125 bid, losartan 100 daily  Cardiology recommends continuation of Coreg and losartan as above, as needed Lasix for dyspnea or edema  Hold off hydralazine at this time . BP goal normotensive  Hyperlipidemia  Lipid lowering medication PTA: Pravachol 80 mg daily  LDL 35, goal < 70  Current lipid lowering medication: Pravastatin 80 mg daily  Continue statin at discharge  Other Stroke Risk Factors  Advanced age  Obesity, Body mass index is 34.77  kg/m., recommend weight loss, diet and exercise as appropriate   Anemia, iron deficiency  Hb 9.2->8.6->7.9->8.4->8.8->7.8  Ferritin 50 iron 10  On iron pills 325 twice daily  CBC monitoring  stool guiac pending  Dysphagia, resolved  On diet  Ensure added d/t poor intake  Other Active Problems  Mildly elevated creatinine, Resolved 1.25->1.28->1.30->1.14 -> 1.07->1.05->0.91  Cough/sore throat, resolved. WBC 5.6. CXR pulm venous congestion, mild CHF cannot be ruled out. Sputum cx consistent with normal respiratory flora. presentation and course felt to be r/t vent tube removal.   Hospital day # [place], MSN, APRN, ANVP-BC, AGPCNP-BC Advanced Practice Stroke Nurse [place] for Schedule & Pager information 05/21/2018 8:46 AM   ATTENDING NOTE: I reviewed above note and agree with the assessment and plan. Pt was seen and examined.   No acute event overnight.  Patient still has mild hoarseness but improving, cough improved on Robitussin.  Otherwise, patient neurologically intact.  PT/OT recommend CIR.  So far tolerating Eliquis.  We will continue on discharge.  Pending for rehab beds.  Potassium was low 3.4, given supplement.  Hemoglobin dropped from 8.8-7.8.  On iron pills.  Pending stool guaiac.  Gloria Hawking, MD PhD Stroke Neurology 05/21/2018 8:46 AM    To contact Stroke Continuity provider, please refer to [email or web address] After hours, contact General Neurology

## 2018-05-21 NOTE — Progress Notes (Signed)
[facility] will sign off.   Medication Recommendations: Contiinue current regimen of Eliquis 5mg  BID, Coreg 3.125mg  BID and losartan 100mg  daily. Consider PRN lasix 20mg  of dyspnea or edema. Other recommendations (labs, testing, etc):  CBC per attending Follow up as an outpatient:  With Dr. Allred/Dr. Ellyn Hack 3-4 weeks post discharge.

## 2018-05-21 NOTE — Progress Notes (Addendum)
IP rehab admissions - I met with patient on 05/19/18 and she told me that she preferred to discharge directly home.  I will follow up today to see if she is interested in CIR.  Call me for questions.  [phone]  I met with patient again this am.  Now she wants inpatient rehab.  I have opened the case with [facility] medicare and will await call back from insurance case manager.  I will update all once I hear back from insurance carrier today.  Call me for questions.  [phone]

## 2018-05-22 ENCOUNTER — Inpatient Hospital Stay (HOSPITAL_COMMUNITY)
Admission: RE | Admit: 2018-05-22 | Payer: Medicare Other | Source: Intra-hospital | Admitting: Physical Medicine & Rehabilitation

## 2018-05-22 LAB — BASIC METABOLIC PANEL
Anion gap: 8 (ref 5–15)
BUN: 9 mg/dL (ref 8–23)
CO2: 25 mmol/L (ref 22–32)
CREATININE: 1.06 mg/dL — AB (ref 0.44–1.00)
Calcium: 9 mg/dL (ref 8.9–10.3)
Chloride: 104 mmol/L (ref 98–111)
GFR calc Af Amer: 58 mL/min — ABNORMAL LOW (ref >60)
GFR calc non Af Amer: 50 mL/min — ABNORMAL LOW (ref >60)
Glucose, Bld: 124 mg/dL — ABNORMAL HIGH (ref 70–99)
Potassium: 3.9 mmol/L (ref 3.5–5.1)
Sodium: 137 mmol/L (ref 135–145)

## 2018-05-22 LAB — GLUCOSE, CAPILLARY
Glucose-Capillary: 113 mg/dL — ABNORMAL HIGH (ref 70–99)
Glucose-Capillary: 149 mg/dL — ABNORMAL HIGH (ref 70–99)
Glucose-Capillary: 98 mg/dL (ref 70–99)

## 2018-05-22 MED ORDER — NITROGLYCERIN 0.4 MG SL SUBL: 0.4000 mg | SUBLINGUAL_TABLET | SUBLINGUAL | 2 refills | Status: AC | PRN

## 2018-05-22 MED ORDER — ENSURE ENLIVE PO LIQD: 237.0000 mL | Freq: Two times a day (BID) | ORAL | 12 refills | Status: DC

## 2018-05-22 MED ORDER — APIXABAN 5 MG PO TABS: 5.0000 mg | ORAL_TABLET | Freq: Two times a day (BID) | ORAL | 2 refills | Status: DC

## 2018-05-22 NOTE — PMR Pre-admission (Signed)
PMR Admission Coordinator Pre-Admission Assessment  Patient: Gloria Joseph is an 78 y.o., female MRN: [id number] DOB: 08/06/1940 Height: 5' 7"  (170.2 cm) Weight: 97.2 kg              Insurance Information HMO: Yes   PPO:       PCP:       IPA:       80/20:       OTHER:   PRIMARY: A[employer] Medicare complete      Policy#: [id number]      Subscriber: patient CM Name: Anderson Malta      Phone#: [phone]     Fax#: [phone] Pre-Cert#: [id number] for 7 days with follow up to Dorthula Nettles     Employer: Not employed Benefits:  Phone #: [phone]     Name:  Wray Kearns. Date: 05/20/2018     Deduct: 40      Out of Pocket Max: $3600      Life Max: N/A CIR: $295 days 1-5      SNF: $0 days 1-20; $160 days 21-43; $0 days 44-100 Outpatient: medical necessity     Co-Pay: $30/visit Home Health: 100%      Co-Pay: none DME: 80%     Co-Pay: 20% Providers: in network  Emergency Contact Information Contact Information    Name Relation Home Work [place] Son [phone]     Blain Pais Daughter   [phone]     Current Medical History  Patient Admitting Diagnosis:Small acute right frontal lobe cortical/subcortical infarct.  Punctate acute or early subacute left cerebellar infarct.   History of Present Illness: A 78 year old female with history of HTN, T2DM, chronic combined CHF, A. fib--on Coumadin, breast cancer, anemia; who was admitted on 05/13/2018 after sustaining a fall on the sidewalk.  Patient struck her head with reports of mild left sided headache and while in ED patient developed vertigo.  CT head negative for acute changes.  MRI limited due to motion but was negative for acute changes.  EKG done revealing ST changes in lateral leads and 2D echo showed severely reduced EF 20 to 25%--appearance somewhat concerning for Takotsubo type  cardiomyopathy.  She was placed on IV heparin and cardiology consulted for input. They questioned questioned true NSTEMI versus demand ischemia and  patient underwent cardiac cath for work up of cardiomyopathy. Cath 12/27 reveled normal coronaries and post procedure patient developed speech difficulty.   Code stroke initiated and CTA head neck was suspicious for proximal M2 occlusion. No tPA as patient already on IV heparin and she underwent cerebral angiogram that was negative for occlusions, stenosis or filling defects. MRI brain done revealing small acute right frontal lobe cortical/subcortical infarct with acute/early subacute left cerebellar infarct and severe small vessel disease. Elevated BP  treated with clevidipine and she was received a dose of lasix due to concerns of overload. Cardiology recommends lasix prn to manage symptoms. Stroke felt to be embolic due to being off coumadin and she was switched to Eliquis. She has developed cough with leucocytosis has resolved and iron supplement added due to anemia.  Patient with balance deficits as well as poor safety awareness affecting ADLs and mobility. CIR recommended due to functional decline.   Complete NIHSS TOTAL: 0  Past Medical History  Past Medical History:  Diagnosis Date  . Anemia 12/18/2011  . Atrial fibrillation ([place]) 04/13/2011  . Cancer [facility])    breast CA s/p Right mastectomy  . Diabetes mellitus, type 2 ([place])  diet controlled  . Hx of echocardiogram    Echocardiogram (12/15): Moderate LVH, EF 55-60%, normal wall motion, grade 1 diastolic dysfunction, aortic sclerosis without stenosis, mild AI, MAC  . Hyperlipidemia   . Hypertension    medicine refractory  . Hypothyroidism    s/p thryroid gland ablation  . Neuromuscular disorder ([place])   . NSTEMI (non-ST elevated myocardial infarction) ([place]) 05/13/2018  . Osteopenia   . Renal insufficiency, mild 09/23/2013  . Stroke ([place]) 05/15/2018    Family History  family history includes Alcohol abuse in her father; Breast cancer in an other family member; Cancer in her maternal grandfather and other family members; Depression  in her mother; Diabetes in an other family member; Heart attack (age of onset: 35) in her brother.  Prior Rehab/Hospitalizations: Had rehab in 12/18.    Has the patient had major surgery during 100 days prior to admission? No  Current Medications   Current Facility-Administered Medications:  .  acetaminophen (TYLENOL) tablet 650 mg, 650 mg, Oral, Q4H PRN, 650 mg at 05/18/18 1010 **OR** [DISCONTINUED] acetaminophen (TYLENOL) solution 650 mg, 650 mg, Per Tube, Q4H PRN, 650 mg at 05/17/18 0531 **OR** [DISCONTINUED] acetaminophen (TYLENOL) suppository 650 mg, 650 mg, Rectal, Q4H PRN, Vonzella Nipple, NP, 650 mg at 05/17/18 2110 .  apixaban (ELIQUIS) tablet 5 mg, 5 mg, Oral, BID, Rosalin Hawking, MD, 5 mg at 05/22/18 1022 .  bisacodyl (DULCOLAX) suppository 10 mg, 10 mg, Rectal, Daily PRN, Rosalin Hawking, MD, 10 mg at 05/20/18 1708 .  carvedilol (COREG) tablet 3.125 mg, 3.125 mg, Oral, BID, Burnell Blanks, MD, 3.125 mg at 05/22/18 1021 .  cholecalciferol (VITAMIN D3) tablet 1,000 Units, 1,000 Units, Oral, Daily, Burnell Blanks, MD, 1,000 Units at 05/22/18 1019 .  citalopram (CELEXA) tablet 20 mg, 20 mg, Oral, Daily, Burnell Blanks, MD, 20 mg at 05/22/18 1019 .  feeding supplement (ENSURE ENLIVE) (ENSURE ENLIVE) liquid 237 mL, 237 mL, Oral, BID BM, Rosalin Hawking, MD, 237 mL at 05/22/18 1025 .  guaiFENesin (ROBITUSSIN) 100 MG/5ML solution 100 mg, 5 mL, Oral, Q4H PRN, Rosalin Hawking, MD, 100 mg at 05/22/18 1450 .  hydrALAZINE (APRESOLINE) injection 5 mg, 5 mg, Intravenous, Q2H PRN, Burnell Blanks, MD, 5 mg at 05/17/18 1847 .  insulin aspart (novoLOG) injection 0-15 Units, 0-15 Units, Subcutaneous, TID WC, Rosalin Hawking, MD, 2 Units at 05/22/18 1222 .  levothyroxine (SYNTHROID, LEVOTHROID) tablet 112 mcg, 112 mcg, Oral, Q0600, Burnell Blanks, MD, 112 mcg at 05/22/18 0518 .  losartan (COZAAR) tablet 100 mg, 100 mg, Oral, Daily, Rosalin Hawking, MD, 100 mg at 05/22/18 1018 .   metFORMIN (GLUCOPHAGE) tablet 500 mg, 500 mg, Oral, BID WC, Rosalin Hawking, MD, 500 mg at 05/22/18 1020 .  multivitamin with minerals tablet 1 tablet, 1 tablet, Oral, Daily, Burnell Blanks, MD, 1 tablet at 05/22/18 1019 .  nitroGLYCERIN (NITROSTAT) SL tablet 0.4 mg, 0.4 mg, Sublingual, Q5 min PRN, Burnell Blanks, MD .  omega-3 acid ethyl esters (LOVAZA) capsule 1 g, 1 g, Oral, Daily, Burnell Blanks, MD, 1 g at 05/22/18 1021 .  ondansetron (ZOFRAN) tablet 4 mg, 4 mg, Oral, Q6H PRN, 4 mg at 05/13/18 1745 **OR** ondansetron (ZOFRAN) injection 4 mg, 4 mg, Intravenous, Q6H PRN, Burnell Blanks, MD, 4 mg at 05/18/18 1011 .  phenol (CHLORASEPTIC) mouth spray 1 spray, 1 spray, Mouth/Throat, PRN, Biby, Sharon L, NP .  pravastatin (PRAVACHOL) tablet 80 mg, 80 mg, Oral, Daily, Rosalin Hawking, MD, 80 mg  at 05/22/18 1017 .  senna-docusate (Senokot-S) tablet 1 tablet, 1 tablet, Oral, QHS PRN, Vonzella Nipple, NP, 1 tablet at 05/20/18 0916 .  vitamin B-12 (CYANOCOBALAMIN) tablet 1,000 mcg, 1,000 mcg, Oral, Daily, Burnell Blanks, MD, 1,000 mcg at 05/22/18 1020  Patients Current Diet:  Diet Order            Diet Heart Room service appropriate? Yes; Fluid consistency: Thin  Diet effective now              Precautions / Restrictions Precautions Precautions: Fall Precaution Comments: not s/s of vertigo in 12/31 treatment Restrictions Weight Bearing Restrictions: No   Has the patient had 2 or more falls or a fall with injury in the past year?Yes.  Patient reports 2 falls with injury.  Prior Activity Level Limited Community (1-2x/wk): Went out 2-3 X a week, was driving to church, otherwise son Management consultant / [place] Devices/Equipment: Radio producer (specify quad or straight), Environmental consultant (specify type) Home Equipment: Environmental consultant - 2 wheels, Cane - single point, Bedside commode, Shower seat, Grab bars - toilet, Grab bars - tub/shower, Hand held  shower head  Prior Device Use: Indicate devices/aids used by the patient prior to current illness, exacerbation or injury? Walker and [employer]  Prior Functional Level Prior Function Level of Independence: Independent with assistive device(s) Comments: pt bathes when family was present at home and drives only when not dizzy.  Self Care: Did the patient need help bathing, dressing, using the toilet or eating?  Independent  Indoor Mobility: Did the patient need assistance with walking from room to room (with or without device)? Independent  Stairs: Did the patient need assistance with internal or external stairs (with or without device)? Independent  Functional Cognition: Did the patient need help planning regular tasks such as shopping or remembering to take medications? Independent  Current Functional Level Cognition  Arousal/Alertness: Awake/alert Overall Cognitive Status: Within Functional Limits for tasks assessed Current Attention Level: Alternating Orientation Level: Oriented X4 Following Commands: Follows one step commands consistently Safety/Judgement: Decreased awareness of safety, Decreased awareness of deficits General Comments: pt indep and cognitively intact prior to stroke. Pt confused, states its 1921 and easily agitated. Most likely due to frontal infarct. Pt with difficulty following commands due to delayed processing and impaired memory. Tangiental in thoughts and requiring increased time and cues throughout session. Pt emotions quickly switches and pt tearful mutiple times during session Attention: Focused, Sustained Focused Attention: Appears intact Sustained Attention: Impaired Sustained Attention Impairment: Verbal basic, Functional basic Memory: Impaired Memory Impairment: Decreased short term memory Decreased Short Term Memory: Verbal basic Awareness: Impaired Awareness Impairment: Intellectual impairment Problem Solving: Impaired Problem Solving Impairment:  Functional basic Executive Function: Initiating Behaviors: Restless Safety/Judgment: Impaired    Extremity Assessment (includes Sensation/Coordination)  Upper Extremity Assessment: Generalized weakness(Decreased coordination; will continue to assess)  Lower Extremity Assessment: Defer to PT evaluation    ADLs  Overall ADL's : Needs assistance/impaired Eating/Feeding: NPO Grooming: Moderate assistance, Sitting, Oral care Grooming Details (indicate cue type and reason): Using mouth swab and rinse for oral care while seated. Despite support at seat, pt requiring Min-Mod A for sitting balance and safety with impulsive movements. Pt requiring Mod A to manage mouth rinse and increased cues to dip swab into rinse and then perform brushing motion.  Upper Body Bathing: Moderate assistance, Sitting Lower Body Bathing: Maximal assistance, Sit to/from stand, +2 for physical assistance Upper Body Dressing : Moderate assistance, Sitting Lower Body Dressing: Maximal  assistance, +2 for physical assistance, Sit to/from stand Lower Body Dressing Details (indicate cue type and reason): Pt attempting to don socks at EOB, but unabel to due to dizziness. Pt requiring Max A and second person to assist with sitting balance. Pt presenting with posterior and left lateral lean sitting at EOB Toilet Transfer: Maximal assistance, +2 for physical assistance, Stand-pivot, BSC Toilet Transfer Details (indicate cue type and reason): Max A +2 for stand pivot to [facility] (turning to left). Pt with poor balance and coordination. Pt very dizzy and becoming nauseous.  Functional mobility during ADLs: Maximal assistance, +2 for physical assistance, Cueing for sequencing, Cueing for safety(stand pivot only) General ADL Comments: Pt presenting with decreased balance, cognition, cooridnation, awareness, and vision.     Mobility  Overal bed mobility: Needs Assistance Bed Mobility: Supine to Sit, Sit to Supine Sidelying to sit: Min  assist Supine to sit: Min guard Sit to supine: Min guard General bed mobility comments: pt able to scoot forward, but falls posteriorly when she releases her arms    Transfers  Overall transfer level: Needs assistance Equipment used: 1 person hand held assist Transfers: Sit to/from Stand Sit to Stand: Min guard Stand pivot transfers: Max assist, +2 physical assistance General transfer comment: used LE's to stabilize against the bed frame, but no assist needed    Ambulation / Gait / Stairs / Wheelchair Mobility  Ambulation/Gait Ambulation/Gait assistance: Min assist, Min guard Gait Distance (Feet): 110 Feet(then 130 ) Assistive device: Rolling walker (2 wheeled) Gait Pattern/deviations: Step-through pattern General Gait Details: Notably steadier, but drifting right consistently, occasionally running into the walls and stationary obstacles.   Gait velocity interpretation: 1.31 - 2.62 ft/sec, indicative of limited community ambulator    Posture / Balance Dynamic Sitting Balance Sitting balance - Comments: posterior lean worse than lateral listing. Balance Overall balance assessment: Needs assistance Sitting-balance support: Feet supported, Bilateral upper extremity supported Sitting balance-Leahy Scale: Fair Sitting balance - Comments: posterior lean worse than lateral listing. Standing balance support: Single extremity supported, Bilateral upper extremity supported Standing balance-Leahy Scale: Fair Standing balance comment: static without AD    Special needs/care consideration BiPAP/CPAP No CPM No Continuous Drip IV No Dialysis No       Life Vest No Oxygen No Special Bed No Trach Size No Wound Vac (area) No      Skin Bruising                              Bowel mgmt: Last BM 05/22/2018 Bladder mgmt: Voiding up in bathroom with assist. Diabetic mgmt Yes, on oral medications    Previous Home Environment Living Arrangements: Children Available Help at Discharge:  Family Type of Home: House Home Layout: Multi-level, Full bath on main level, Able to live on main level with bedroom/bathroom Home Access: Level entry Bathroom Shower/Tub: Chiropodist: Standard Home Care Services: No Additional Comments: Information collected from prior evaluation. Pt was at home with children and Indep PTA  Discharge Living Setting Plans for Discharge Living Setting: Patient's home, House, Lives with (comment)(Lives with son.) Type of Home at Discharge: House Discharge Home Layout: Multi-level, Able to live on main level with bedroom/bathroom Alternate Level Stairs-Number of Steps: Flight Discharge Home Access: Stairs to enter [employer] of Steps: 3 steps Discharge Bathroom Shower/Tub: Tub/shower unit, Curtain Discharge Bathroom Toilet: Handicapped height Discharge Bathroom Accessibility: Yes How Accessible: Accessible via walker Does the patient have any problems obtaining your  medications?: No  Social/Family/Support Systems Patient Roles: Parent(Has a son and a daughter.) Contact Information: Rachael Darby - son Anticipated Caregiver: son Anticipated Caregiver's Contact Information: Fritz Pickerel - son - [phone] Ability/Limitations of Caregiver: son works 2 days a week and can assist.  Dtr work. Caregiver Availability: Intermittent Discharge Plan Discussed with Primary Caregiver: Yes Is Caregiver In Agreement with Plan?: Yes Does Caregiver/Family have Issues with Lodging/Transportation while Pt is in Rehab?: No  Goals/Additional Needs Patient/Family Goal for Rehab: PT/OT mod I to supervision goals Expected length of stay: 7 days Cultural Considerations: Attends [employer] church Dietary Needs: Heart diet, thin liquids Equipment Needs: TBD Pt/Family Agrees to Admission and willing to participate: Yes Program Orientation Provided & Reviewed with Pt/Caregiver Including Roles  & Responsibilities: Yes  Decrease burden of Care  through IP rehab admission: N/A  Possible need for SNF placement upon discharge: Not anticipated  Patient Condition: This patient's medical and functional status has changed since the consult dated: 05/18/18 in which the Rehabilitation Physician determined and documented that the patient's condition is appropriate for intensive rehabilitative care in an inpatient rehabilitation facility. See "History of Present Illness" (above) for medical update. Functional changes are: Currently requiring min to minguard assist to ambulate 130 feet RW. Patient's medical and functional status update has been discussed with the Rehabilitation physician and patient remains appropriate for inpatient rehabilitation. Will admit to inpatient rehab today.  Preadmission Screen Completed By:  Retta Diones, 05/22/2018 4:04 PM ______________________________________________________________________   Discussed status with Dr. Naaman Plummer on 05/22/18 at 1604 and received telephone approval for admission today.  Admission Coordinator:  Retta Diones, time 1604/Date 05/22/18

## 2018-05-22 NOTE — Plan of Care (Signed)
Patient stated "I'm making good progress, I just need to get a little stronger so I can get out of here". Patient had a positive attitude while doing the NIH assessment with nurse.

## 2018-05-22 NOTE — Consult Note (Signed)
            [facility] CM Primary Care Navigator  05/22/2018  Gloria Joseph 17-Sep-1940 [id number]   Seen patient atthe bedsideto identify possible discharge needs.  Patientreports having a "fall at [facility]" and developed dizziness/ vertigo that had led to this admission, she then develop speech difficulty. (Stroke: embolic in nature; underwent cardiac cath for work up of cardiomyopathy-Takotsubo, atrial fibrillation, non-ST elevated myocardial infarction, anemia)  Patient endorses Dr. Jani Gravel with [facility] as her primary care provider.   [facility] [address].  Patientverbalized that she hasbeenmanaging herown medications at [employer] use of "pill box" system filled every 2 weeks.  Patientreports that she has been driving prior to admission but son Gloria Joseph) or ex-husband Gloria Joseph) will able to provide transportation to her todoctors' appointments after discharge, otherwise, will use UHC transportation benefit if needed.  Patient lives with son who serves as her primary caregiver at home but daughter Gloria Joseph) can also assist when needed.  Anticipated plan for discharge [facility] per therapy recommendation.  Patientvoiced understandingto callprimary careprovider's office whenshereturns backhomefor a post discharge follow-upvisit within1- 2 weeksor sooner if needs arise.Patient letter (with PCP's contact number) was provided as areminder.   Explained topatientaboutTHN CM services available for health management andresourcesat homeand she stated that her HTN, DM and A-fib are all under good control and have been managed well, not having any needs or concerns for now.  Patientexpressedunderstandingto seekreferral from primary care provider to [facility] care management asdeemed necessary and appropriatefor  anyservicesin the nearfuture- once she returns tohome.   [facility] care management information was provided for future needs thatshemay have.  Primary care provider's office is listed as providing transition of care (TOC) follow-up.  Noted EMMI stroke calls in place to follow-up with her recovery and she was made aware of it.    For additional questions please contact:  Edwena Felty A. Shaniqwa Horsman, BSN, RN-BC [facility] PRIMARY CARE Navigator Cell: [phone]

## 2018-05-22 NOTE — Progress Notes (Signed)
IP rehab admissions - I have approval for acute inpatient rehab admission for today.  I met with patient and she would like CIR for a few days.  Bed available and will admit to CIR today.  Call me for questions.  [phone]

## 2018-05-22 NOTE — Progress Notes (Signed)
STROKE TEAM PROGRESS NOTE   SUBJECTIVE (INTERVAL HISTORY) Patient anxious to go to rehab. Fax issues at insurance co yesterday precluded d/c. Await their approval.   OBJECTIVE Vitals:   05/22/18 0423 05/22/18 0500 05/22/18 0844 05/22/18 1135  BP: (!) 162/72  (!) 143/43 138/64  Pulse: 61  (!) 52 (!) 52  Resp: 20  18 18   Temp: 99.1 F (37.3 C)  98.8 F (37.1 C) 97.8 F (36.6 C)  TempSrc: Oral  Oral   SpO2: 96%  96% 99%  Weight:  97.2 kg    Height:        CBC:  Recent Labs  Lab 05/19/18 0504 05/20/18 0451  WBC 7.0 5.6  HGB 8.8* 7.8*  HCT 27.7* 25.3*  MCV 90.2 89.1  PLT 264 409    Basic Metabolic Panel:  Recent Labs  Lab 05/21/18 0350 05/22/18 0551  NA 136 137  K 4.4 3.9  CL 105 104  CO2 22 25  GLUCOSE 118* 124*  BUN 13 9  CREATININE 0.91 1.06*  CALCIUM 8.8* 9.0    Lipid Panel:     Component Value Date/Time   CHOL 88 05/16/2018 0425   TRIG 77 05/16/2018 0425   HDL 38 (L) 05/16/2018 0425   CHOLHDL 2.3 05/16/2018 0425   VLDL 15 05/16/2018 0425   LDLCALC 35 05/16/2018 0425   HgbA1c:  Lab Results  Component Value Date   HGBA1C 5.6 05/16/2018    IMAGING No results found. past 24h   PHYSICAL EXAM General - Well nourished, well developed, not in acute distress. Voice hoarseness improved. Neuro - awake, alert, orientated to self, people and place, time.  Able to follow simple and 3-step commands. No aphasia. PERL, EOMI, visual field full. Facial symmetric. Tongue midline. RUE 4+/5, LUE 5/5 and. BLE 5/5. Sensation symmetrical, coordination slow but intact finger-to-nose bilaterally and heel shin. Gait not tested.    ASSESSMENT/PLAN Gloria Joseph is a 78 y.o. female with history of A. Fib (coumadin), HTN, HLD, breast cancer, NSTEMI 05/13/2018.  presenting with speech difficulties after cardiac cath. She did not receive IV t-PA due to heparin already administered.  Stroke - left cerebellar and right frontal small infarcts, embolic in nature, could  be due to A. fib off Coumadin  CT head - No evidence of acute intracranial abnormality.   MRI head - left cerebellar and right frontal small infarcts, not explaining presenting symptoms  CTA H&N - Severely motion degraded examination. Findings suspicious for proximal left M2 occlusion.   Cerebral Angiogram - no evidence of occlusions or stenosis.  2D Echo - 05/13/2018 - EF 20% to 25%, concerning for Takotsubo cardiomyopathy  LDL - 35  HgbA1c 5.6  VTE prophylaxis - Eliquis  warfarin daily prior to admission, INR 1.4. changed to eliquis which was started 12/30  Therapy recommendations:  CIR  Disposition:  Await CIR bed, insurance approval  Takotsubo cardiomyopathy Non-STEMI with cardiomyopathy  05/13/2018 off Coumadin for cardiac cath  Cardiac cath unremarkable  On Eliquis   EF 20 to 25% now concerning forTakotsubo cardiomyopathy  BNP 661.5->864.1  On Coreg 3.125 bid, losartan 100 daily  Cardiology recommends continuation of Coreg and losartan as above, as needed Lasix for dyspnea or edema  A. fib, chronic  On Coumadin PTA  INR 2.69-2.43-1.57-1.47-1.41  Post cath  EF 20 to 25%  On Eliquis   Cardiology agrees with continuation  Hypertension  Stable  On Coreg 3.125 bid, losartan 100 daily  Cardiology recommends continuation of Coreg and  losartan as above, as needed Lasix for dyspnea or edema  Hold off hydralazine at this time . BP goal normotensive  Hyperlipidemia  Lipid lowering medication PTA: Pravachol 80 mg daily  LDL 35, goal < 70  Current lipid lowering medication: Pravastatin 80 mg daily  Continue statin at discharge  Other Stroke Risk Factors  Advanced age  Obesity, Body mass index is 33.56 kg/m., recommend weight loss, diet and exercise as appropriate   Anemia, iron deficiency  Chronic anemia per pt  Hb 9.2->8.6->7.9->8.4->8.8->7.8  Ferritin 50 iron 10  On iron pills 325 twice daily  Complaints of diarrhea on iron.  States she has tried it before and it does not work. She requested iron to be stopped  stool guiac pending  Dysphagia, resolved  On diet  Ensure added d/t poor intake  Other Active Problems  Mildly elevated creatinine, Resolved 1.25->1.28->1.30->1.14 -> 1.07->1.05->0.91->1.06  Cough/sore throat, resolved. WBC 5.6. CXR pulm venous congestion, mild CHF cannot be ruled out. Sputum cx consistent with normal respiratory flora. presentation and course felt to be r/t vent tube removal.   Hospital day # [place], MSN, APRN, ANVP-BC, AGPCNP-BC Advanced Practice Stroke Nurse [place] for Schedule & Pager information 05/22/2018 3:15 PM    To contact Stroke Continuity provider, please refer to [email or web address] After hours, contact General Neurology

## 2018-05-22 NOTE — Progress Notes (Signed)
IP rehab admissions - patient has decided to discharge home and not admit to inpatient rehab.  She does not want to have $295 copays for 5 days if she should admit to CIR.  I informed Burnetta Sabin NP and RN is aware.  I have cancelled the admission to CIR.  I have talked with patient about the above.  Call me for questions.  [phone]

## 2018-05-22 NOTE — Care Management Note (Signed)
Case Management Note  Patient Details  Name: Gloria Joseph MRN: [id number] Date of Birth: 01/28/1941  Subjective/Objective:                    Action/Plan: Pt discharging to CIR today. CM signing off.  Expected Discharge Date:  05/21/18               Expected Discharge Plan:  [place]  In-House Referral:     Discharge planning Services  CM Consult  Post Acute Care Choice:    Choice offered to:     DME Arranged:    DME Agency:     HH Arranged:    HH Agency:     Status of Service:  Completed, signed off  If discussed at [employer], dates discussed:    Additional Comments:  Pollie Friar, RN 05/22/2018, 3:39 PM

## 2018-05-23 ENCOUNTER — Inpatient Hospital Stay (HOSPITAL_COMMUNITY): Payer: Medicare Other | Admitting: Speech Pathology

## 2018-05-23 ENCOUNTER — Inpatient Hospital Stay (HOSPITAL_COMMUNITY): Payer: Medicare Other | Admitting: Occupational Therapy

## 2018-05-23 ENCOUNTER — Inpatient Hospital Stay (HOSPITAL_COMMUNITY): Payer: Medicare Other

## 2018-05-24 ENCOUNTER — Inpatient Hospital Stay (HOSPITAL_COMMUNITY): Payer: Medicare Other | Admitting: Occupational Therapy

## 2018-05-25 ENCOUNTER — Inpatient Hospital Stay (HOSPITAL_COMMUNITY): Payer: Medicare Other

## 2018-05-25 ENCOUNTER — Inpatient Hospital Stay (HOSPITAL_COMMUNITY): Payer: Medicare Other | Admitting: Occupational Therapy

## 2018-05-27 ENCOUNTER — Encounter (HOSPITAL_COMMUNITY): Payer: Self-pay | Admitting: Interventional Radiology

## 2018-06-04 ENCOUNTER — Other Ambulatory Visit: Payer: Self-pay | Admitting: *Deleted

## 2018-06-04 DIAGNOSIS — R42 Dizziness and giddiness: Secondary | ICD-10-CM | POA: Diagnosis not present

## 2018-06-04 DIAGNOSIS — E039 Hypothyroidism, unspecified: Secondary | ICD-10-CM | POA: Diagnosis not present

## 2018-06-04 DIAGNOSIS — I10 Essential (primary) hypertension: Secondary | ICD-10-CM | POA: Diagnosis not present

## 2018-06-04 NOTE — Patient Outreach (Signed)
[facility] [facility]) Care Management  06/04/2018  Gloria Joseph 07/10/1940 [id number]   EMMI-stroke  RED ON EMMI ALERT Day # 9 Date: Wednesday 06/03/2018 [place] Reason: Lost interest in things they used to enjoy? Yes    Insurance: united health care medicare  [facility] admissions x 1 ED visits x 1 in the last 6 months  Admission 05/12/18 to 05/22/2018-stroke     Transition of care services noted to be completed by primary care MD office staff- [facility] medical associates Dr Jani Gravel  Transition of Care will be completed by primary care provider office who will refer to [facility] care management if needed.   Conditions: NSTEMI 05/13/2018, Stroke - left cerebellar and right frontal small infarcts, embolic in nature, could be due to A. fib off Coumadin,  HTN, HLD, breast cancer, anemia  Outreach attempt # 1 No answer. [facility] RN CM left HIPAA compliant voicemail message along with CM's contact info.   Plan: [facility] RN CM sent an unsuccessful outreach letter and scheduled this patient for another call attempt within 4 business days   Kimberly L. Lavina Hamman, RN, BSN, [place] Coordinator Office number [phone] Mobile number [phone]  Main [facility] number [phone] Fax number [phone]

## 2018-06-05 ENCOUNTER — Other Ambulatory Visit: Payer: Self-pay | Admitting: *Deleted

## 2018-06-05 NOTE — Patient Outreach (Signed)
[place] [facility]) Care Management  06/05/2018  Gloria Joseph 08-26-1940 [id number]   EMMI-stroke  RED ON EMMI ALERT Day # 9 Date: Wednesday 06/03/2018 [place] Reason: Lost interest in things they used to enjoy? Yes    Insurance: united health care medicare  [facility] admissions x 1 ED visits x 1 in the last 6 months  Admission 05/12/18 to 05/22/2018-stroke    Transition of care services noted to be completed by primary care MD office staff- [facility] medical associates Dr Jani Gravel   Transition of Care will be completed by primary care provider office who will refer to [facility] care management if needed.   Outreach attempt # 2 successful at her home number   Patient is able to verify HIPAA Reviewed and addressed EMMI red alert/referral to [facility] with patient She reports being evaluated for further inpatient rehab and mobility at the hospital and was found not to need further inpatient nor home therapies but did well with the use of her cane She has a walker for use prn  She voices understanding that she was at church prior to the hospitalization, had a MI and a procedure leading to her stroke related to a "blood clot"  EMMI- [name] states the answer to the EMMI question is incorrect She denies a lost of interest in things she use to do. She reports visiting with friends on 06/04/2018 evening and plans to go out to eat with her son.   Social: [name] is divorced and has support from her son and daughter Gloria Joseph and Gloria Joseph) She is independent with ADLS and independent/Assist with iADLs. Her son or daughter is presently assisting with transportation to medical appointments although she is still able to drive. She has not driven since hospital discharge because she has not felt comfortable in driving  Conditions: NSTEMI 05/14/2019, Stroke - left cerebellar and right frontal small infarcts, embolic in nature, could be due to A. fib off Coumadin,  HTN, HLD,  breast cancer, anemia, DM type 2    Medications: She reports a lot of change in her medications but denies concerns with taking medications as prescribed, affording medications, side effects of medications and questions about medications She did share that she did have memory concerns related to narcotic use in hospital but is now getting better.  She reports change from coumadin to eliquis but at this time denies any concerns    Appointments: She confirms she went to be seen by her primary MD on 06/04/2018 and has is pending an upcoming appointment with her neurologist    Advance Directives: Denies need for assist with advance directives     Consent: [facility] RN CM reviewed [facility] services with patient. Patient gave verbal consent for services.  plans [facility] RN CM will close case at this time as patient has been assessed and no needs identified/needs resolved.   Pt encouraged to return a call to [facility] RN CM prn  [facility] RN CM Mrs Saline to review the outreach letter and [facility] brochure sent to her    [place]. Lavina Hamman, RN, BSN, [employer] Coordinator Office number [phone] Mobile number [phone]  Main [facility] number [phone] Fax number [phone]

## 2018-06-18 DIAGNOSIS — D649 Anemia, unspecified: Secondary | ICD-10-CM | POA: Diagnosis not present

## 2018-06-18 DIAGNOSIS — R42 Dizziness and giddiness: Secondary | ICD-10-CM | POA: Diagnosis not present

## 2018-06-18 DIAGNOSIS — R112 Nausea with vomiting, unspecified: Secondary | ICD-10-CM | POA: Diagnosis not present

## 2018-06-18 DIAGNOSIS — K644 Residual hemorrhoidal skin tags: Secondary | ICD-10-CM | POA: Diagnosis not present

## 2018-06-18 DIAGNOSIS — K625 Hemorrhage of anus and rectum: Secondary | ICD-10-CM | POA: Diagnosis not present

## 2018-06-18 DIAGNOSIS — Z7901 Long term (current) use of anticoagulants: Secondary | ICD-10-CM | POA: Diagnosis not present

## 2018-06-29 ENCOUNTER — Ambulatory Visit: Payer: Medicare Other | Admitting: Adult Health

## 2018-06-29 ENCOUNTER — Encounter: Payer: Self-pay | Admitting: Adult Health

## 2018-06-29 VITALS — BP 135/80 | HR 68 | Ht 67.0 in | Wt 200.0 lb

## 2018-06-29 DIAGNOSIS — E782 Mixed hyperlipidemia: Secondary | ICD-10-CM | POA: Diagnosis not present

## 2018-06-29 DIAGNOSIS — E119 Type 2 diabetes mellitus without complications: Secondary | ICD-10-CM

## 2018-06-29 DIAGNOSIS — I749 Embolism and thrombosis of unspecified artery: Secondary | ICD-10-CM

## 2018-06-29 DIAGNOSIS — I69320 Aphasia following cerebral infarction: Secondary | ICD-10-CM | POA: Diagnosis not present

## 2018-06-29 DIAGNOSIS — I480 Paroxysmal atrial fibrillation: Secondary | ICD-10-CM

## 2018-06-29 DIAGNOSIS — R42 Dizziness and giddiness: Secondary | ICD-10-CM

## 2018-06-29 NOTE — Progress Notes (Signed)
[facility] [address] [place]. [place] [address] [phone]       OFFICE FOLLOW UP NOTE  [name] Date of Birth:  Sep 29, 1940 Medical Record Number:  [id number]   Reason for Referral:  hospital stroke follow up  CHIEF COMPLAINT:  Chief Complaint  Patient presents with  . Follow-up    Hospital stroke follow up room in back hallway patient alone, pt walk without any DME assistance Pt states she has chronic veritgo  and takes meds and does exercise pt with son Chann    HPI: Gloria Joseph is being seen today for initial visit in the office for left cerebellar and right frontal small infarcts embolic pattern likely secondary to A. fib off Coumadin on 05/15/2018. History obtained from patient and chart review. Reviewed all radiology images and labs personally.  Gloria Hussar Edwardsis an 78 y.o.femalewith PMH A. Fib (coumadin), HTN, HLD, and breast cancer who was currently admitted for NSTEMI 05/13/2018.   She underwent cardiac cath on 05/15/2018 and returned without neurological deficits but was then found by nursing to have a sudden onset of speech difficulties and slurred speech.  CT had obtained which was negative for acute infarct or hemorrhage.  CTA showed proximal M2 occlusion.  Unable to receive TPA as she received heparin during cardiac cath.  MRI head reviewed and showed left cerebellar and right frontal small infarcts which do not correlate to speech symptoms.  Cerebral angiogram did not show evidence of occlusions or stenosis.  2D echo showed an EF of 20 to 25% concerning for Takotsubo cardiomyopathy.  LDL 35 and A1c 5.6.  She was previously on warfarin daily but was discontinued upon admission for cardiac cath which was most likely the etiology of embolic infarcts.  Recommended to switch therapy to Eliquis and was started on 05/18/2018.  HTN stable and recommended BP goal normotensive range.  LDL 35 and recommend continuation of pravastatin 80 mg  daily.  Other stroke risk factors include advanced age and obesity.  She was discharged home in stable condition with recommended home PT/OT.  Gloria Joseph is being seen today for hospital follow-up.  Overall, she continues to be stable from a stroke standpoint with expressive aphasia and balance difficulties.  She does have underlying history of vertigo which has possibly worsened since her recent stroke.  She did have sessions of home physical therapy for vertigo for 1 month but unfortunately continues to experience vertigo difficulties. She denies receiving any speech therapy. She continues on Eliquis without bruising but did have recent ED admission on 06/18/2018 due to rectal bleeding and was felt related to external hemorrhoid with recommended follow-up with GI.  She continues to follow with Dr. Rayann Heman for atrial fibrillation and Eliquis management.  She is concerned regarding continuation of Eliquis as she has all the reported "side effects" which she found online.  The only side effect that she was able to report during visit was the episode of hemorrhoid bleeding.  She states that she read side effects regarding She denies any additional bleeding since this time.  Continues on pravastatin without side effects of myalgias.  Blood pressure today 135/80.  She continues to live with her son and has not returned to driving at this time.  Denies new or worsening stroke/TIA symptoms.   ROS:   14 system review of systems performed and negative with exception of weight loss, ringing in ears, easy bruising, easy bleeding, feeling cold, joint pain, cramps, memory loss, confusion, slurred  speech, dizziness, decreased energy, change in appetite, sleepiness and restless legs  PMH:  Past Medical History:  Diagnosis Date  . Anemia 12/18/2011  . Atrial fibrillation ([place]) 04/13/2011  . Cancer [facility])    breast CA s/p Right mastectomy  . Diabetes mellitus, type 2 (HCC)    diet controlled  . Hx of echocardiogram     Echocardiogram (12/15): Moderate LVH, EF 55-60%, normal wall motion, grade 1 diastolic dysfunction, aortic sclerosis without stenosis, mild AI, MAC  . Hyperlipidemia   . Hypertension    medicine refractory  . Hypothyroidism    s/p thryroid gland ablation  . Neuromuscular disorder ([place])   . NSTEMI (non-ST elevated myocardial infarction) ([place]) 05/13/2018  . Osteopenia   . Renal insufficiency, mild 09/23/2013  . Stroke [facility]) 05/15/2018    PSH:  Past Surgical History:  Procedure Laterality Date  . BREAST ENHANCEMENT SURGERY    . BREAST IMPLANT REMOVAL  2000   Unilaterally   . BREAST SURGERY    . CHOLECYSTECTOMY    . COLONOSCOPY  2002   Neg  . CORONARY ANGIOGRAPHY N/A 05/15/2018   Procedure: CORONARY ANGIOGRAPHY;  Surgeon: Burnell Blanks, MD;  Location: [place] CV [facility];  Service: Cardiovascular;  Laterality: N/A;  . IR ANGIO INTRA EXTRACRAN SEL COM CAROTID INNOMINATE BILAT MOD SED  05/15/2018  . IR ANGIO VERTEBRAL SEL SUBCLAVIAN INNOMINATE UNI R MOD SED  05/15/2018  . MASTECTOMY    . RADIOLOGY WITH ANESTHESIA N/A 05/15/2018   Procedure: IR WITH ANESTHESIA;  Surgeon: Luanne Bras, MD;  Location: [place];  Service: Radiology;  Laterality: N/A;  . RAI  04/04/2006  . TUBAL LIGATION      Social History:  Social History   Socioeconomic History  . Marital status: Divorced    Spouse name: Not on file  . Number of children: Not on file  . Years of education: Not on file  . Highest education level: Not on file  Occupational History  . Not on file  Social Needs  . Financial resource strain: Not on file  . Food insecurity:    Worry: Not on file    Inability: Not on file  . Transportation needs:    Medical: Not on file    Non-medical: Not on file  Tobacco Use  . Smoking status: Never Smoker  . Smokeless tobacco: Never Used  Substance and Sexual Activity  . Alcohol use: No  . Drug use: No  . Sexual activity: Never  Lifestyle  . Physical activity:    Days per  week: Not on file    Minutes per session: Not on file  . Stress: Not on file  Relationships  . Social connections:    Talks on phone: Not on file    Gets together: Not on file    Attends religious service: Not on file    Active member of club or organization: Not on file    Attends meetings of clubs or organizations: Not on file    Relationship status: Not on file  . Intimate partner violence:    Fear of current or ex partner: Not on file    Emotionally abused: Not on file    Physically abused: Not on file    Forced sexual activity: Not on file  Other Topics Concern  . Not on file  Social History Narrative  . Not on file    Family History:  Family History  Problem Relation Age of Onset  . Depression Mother   .  Alcohol abuse Father   . Heart attack Brother 34  . Cancer Other        Uncle, not specific  . Cancer Other        Aunt, not specific. GYN CA  . Breast cancer Other        Aunt, not specific  . Diabetes Other        Grandmother, not specific  . Cancer Maternal Grandfather        colon    Medications:   Current Outpatient Medications on File Prior to Visit  Medication Sig Dispense Refill  . apixaban (ELIQUIS) 5 MG TABS tablet Take 1 tablet (5 mg total) by mouth 2 (two) times daily. 60 tablet 2  . carvedilol (COREG) 3.125 MG tablet Take 1 tablet by mouth 2 (two) times daily.    . Cholecalciferol (VITAMIN D-3) 1000 UNITS CAPS Take 1 capsule by mouth daily.    . citalopram (CELEXA) 20 MG tablet Take 20 mg by mouth daily.    . Coenzyme Q10 (CO Q 10 PO) Take 1 capsule by mouth daily.    . feeding supplement, ENSURE ENLIVE, (ENSURE ENLIVE) LIQD Take 237 mLs by mouth 2 (two) times daily between meals. 237 mL 12  . Flaxseed, Linseed, (FLAXSEED OIL PO) Take 1 tablet by mouth daily.    Marland Kitchen levothyroxine (SYNTHROID, LEVOTHROID) 112 MCG tablet Take 1 tablet (112 mcg) by mouth daily. EXCEPT Tuesdays and Thursdays take 1/2 tablet (56 mcg) by mouth daily.    Marland Kitchen losartan (COZAAR)  100 MG tablet Take 100 mg by mouth daily.    . metFORMIN (GLUCOPHAGE) 1000 MG tablet Take 500 mg by mouth 2 (two) times daily.    . Multiple Vitamins-Minerals (MULTIVITAMINS) CHEW Chew 1 tablet by mouth daily.    . nitroGLYCERIN (NITROSTAT) 0.4 MG SL tablet Place 1 tablet (0.4 mg total) under the tongue every 5 (five) minutes as needed for chest pain. 30 tablet 2  . Omega-3 Fatty Acids (FISH OIL PO) Take 1 capsule by mouth daily.    . pravastatin (PRAVACHOL) 80 MG tablet Take 80 mg by mouth daily.     . vitamin B-12 (CYANOCOBALAMIN) 1000 MCG tablet Take 1,000 mcg by mouth daily.    Marland Kitchen gabapentin (NEURONTIN) 300 MG capsule      No current facility-administered medications on file prior to visit.     Allergies:   Allergies  Allergen Reactions  . Lisinopril     ? Angioedema (NOT ARB candidate) Because of a history of documented adverse serious drug reaction;Medi Alert bracelet  is recommended  . Hctz [Hydrochlorothiazide]     hyponatremia  . Spironolactone     REACTION: low potassium ???  . Penicillins Other (See Comments)    Pt states medication doesn't work for her d/t her usage of it several years ago.  . Amlodipine Besylate     REACTION: edema     Physical Exam  Vitals:   06/29/18 1318  BP: 135/80  Pulse: 68  Weight: 200 lb (90.7 kg)  Height: _0  (1.702 m)   Body mass index is 31.32 kg/m. No exam data present  General: well developed, well nourished, pleasant elderly Caucasian female, seated, in no evident distress Head: head normocephalic and atraumatic.   Neck: supple with no carotid or supraclavicular bruits Cardiovascular: regular rate and rhythm, no murmurs Musculoskeletal: no deformity Skin:  no rash/petichiae Vascular:  Normal pulses all extremities  Neurologic Exam Mental Status: Awake and fully alert. Aphasia and dysarthria. Oriented  to place and time. Recent and remote memory intact. Attention span, concentration and fund of knowledge appropriate. Mood  and affect appropriate.  Cranial Nerves: Fundoscopic exam reveals sharp disc margins. Pupils equal, briskly reactive to light. Extraocular movements full without nystagmus. Visual fields full to confrontation. Hearing intact. Facial sensation intact. Face, tongue, palate moves normally and symmetrically.  Motor: Normal bulk and tone. Normal strength in all tested extremity muscles. Sensory.: intact to touch , pinprick , position and vibratory sensation.  Coordination: Rapid alternating movements normal in all extremities. Finger-to-nose and heel-to-shin performed accurately bilaterally. Gait and Station: Arises from chair without difficulty. Stance is normal. Gait demonstrates normal stride length and balance. Able to heel, toe and tandem walk without difficulty.  Reflexes: 1+ and symmetric. Toes downgoing.    NIHSS  2 Modified Rankin  2 CHA2DS2-VASc 7 HAS-BLED 2   Diagnostic Data (Labs, Imaging, Testing)  Ct Head Code Stroke Wo Contrast 05/15/2018 1. No evidence of acute intracranial abnormality.  2. ASPECTS is 10.  3. Severe chronic small vessel ischemic disease.   Ct Angio Head W Or Wo Contrast Ct Angio Neck W Or Wo Contrast 05/15/2018 1. Severely motion degraded examination.  2. Findings suspicious for proximal left M2 occlusion.  3. No evidence of large vessel occlusion more proximally.  4. Calcified plaque at the carotid bifurcations in the neck. Nondiagnostic evaluation for stenosis due to motion.   IR 4 vessel cerebral arteriogram. RT CFA approach. 1. Angiographically no evidence of occlusions,stenosis,intraluminal filling defects,dissections or of AV shunting. 2.Venous outflow WNLs.  Mr Brain Wo Contrast 05/16/2018 1. Small acute right frontal lobe cortical/subcortical infarct. 2. Punctate acute or early subacute left cerebellar infarct. 3. Severe chronic small vessel ischemic disease.   Transthoracic Echocardiogram  05/13/2018 - Left ventricle: The cavity size  was mildly dilated. Wallthickness was increased in a pattern of mild LVH. Systolicfunction was severely reduced. The estimated ejection fractionwas in the range of 20% to 25%. There is akinesis of theapicalanteroseptal, anterior, and inferior myocardium. Dopplerparameters are consistent with abnormal left ventricularrelaxation (grade 1 diastolic dysfunction). Doppler parametersare consistent with high ventricular filling pressure. - Aortic valve: There was mild regurgitation. - Left atrium: The atrium was mildly dilated. - Right ventricle: The cavity size was mildly dilated. Systolicfunction was mildly to moderately reduced. - Pulmonary arteries: PA peak pressure: 45 mm Hg (S). - Pericardium, extracardiac: A trivial pericardial effusion wasidentified posterior to the heart.   ASSESSMENT: Gloria Joseph is a 78 y.o. year old female here with left cerebellar and right frontal small infarcts embolic pattern likely secondary to A. fib off Coumadin on 05/15/2018. Vascular risk factors include PAF, HTN, HLD, breast cancer and recent NSTEMI 05/13/2018.  She is being seen today for hospital follow-up with continued post stroke deficits of aphasia/dysarthria and worsening vertigo.    PLAN:  1. Embolic infarcts: Continue Eliquis (apixaban) daily  and pravastatin for secondary stroke prevention. Maintain strict control of hypertension with blood pressure goal below 130/90, diabetes with hemoglobin A1c goal below 6.5% and cholesterol with LDL cholesterol (bad cholesterol) goal below 70 mg/dL.  I also advised the patient to eat a healthy diet with plenty of whole grains, cereals, fruits and vegetables, exercise regularly with at least 30 minutes of continuous activity daily and maintain ideal body weight. 2. Post stroke deficits: Referral placed for continuation of home PT/ST for ongoing worsening vertigo and aphasia/dysarthria with mild cognitive deficits 3. HTN: Advised to continue current treatment  regimen.  Today's BP 135/80.  Advised  to continue to monitor at home along with continued follow-up with PCP for management 4. HLD: Advised to continue current treatment regimen along with continued follow-up with PCP for future prescribing and monitoring of lipid panel 5. Atrial fibrillation: Continue Eliquis along with continued follow-up with cardiology for management -highly recommend continuation of Eliquis for secondary stroke prevention    Follow up in 3 months or call earlier if needed   Greater than 50% of time during this 25 minute visit was spent on counseling, explanation of diagnosis of embolic infarcts, reviewing risk factor management of HTN, HLD and atrial fibrillation, planning of further management along with potential future management, and discussion with patient and family answering all questions.    Venancio Poisson, AGNP-BC  [facility] [address] [place], [place] [address]  Phone [phone] Fax [phone] Note: This document was prepared with digital dictation and possible smart phrase technology. Any transcriptional errors that result from this process are unintentional.

## 2018-06-29 NOTE — Patient Instructions (Addendum)
Continue Eliquis (apixaban) daily  and pravastatin   for secondary stroke prevention  Continue to follow up with PCP regarding cholesterol, diabetes  and blood pressure management   Referral placed for speech therapy due to continued speech difficulties and physical therapy for continued vertigo   Continue to follow up with Dr. Rayann Heman regarding atrial fibrillation and Eliquis management   Continue to monitor blood pressure at home  Maintain strict control of hypertension with blood pressure goal below 130/90, diabetes with hemoglobin A1c goal below 6.5% and cholesterol with LDL cholesterol (bad cholesterol) goal below 70 mg/dL. I also advised the patient to eat a healthy diet with plenty of whole grains, cereals, fruits and vegetables, exercise regularly and maintain ideal body weight.  Followup in the future with me in 3 months or call earlier if needed        Thank you for coming to see [place] at [facility]. I hope we have been able to provide you high quality care today.  You may receive a patient satisfaction survey over the next few weeks. We would appreciate your feedback and comments so that we may continue to improve ourselves and the health of our patients.

## 2018-07-01 NOTE — Progress Notes (Signed)
I agree with the above plan 

## 2018-07-05 DIAGNOSIS — I480 Paroxysmal atrial fibrillation: Secondary | ICD-10-CM | POA: Diagnosis not present

## 2018-07-05 DIAGNOSIS — I10 Essential (primary) hypertension: Secondary | ICD-10-CM | POA: Diagnosis not present

## 2018-07-05 DIAGNOSIS — K760 Fatty (change of) liver, not elsewhere classified: Secondary | ICD-10-CM | POA: Diagnosis not present

## 2018-07-05 DIAGNOSIS — I313 Pericardial effusion (noninflammatory): Secondary | ICD-10-CM | POA: Diagnosis not present

## 2018-07-05 DIAGNOSIS — Z853 Personal history of malignant neoplasm of breast: Secondary | ICD-10-CM | POA: Diagnosis not present

## 2018-07-05 DIAGNOSIS — Z888 Allergy status to other drugs, medicaments and biological substances status: Secondary | ICD-10-CM | POA: Diagnosis not present

## 2018-07-05 DIAGNOSIS — Z7984 Long term (current) use of oral hypoglycemic drugs: Secondary | ICD-10-CM | POA: Diagnosis not present

## 2018-07-05 DIAGNOSIS — Z880 Allergy status to penicillin: Secondary | ICD-10-CM | POA: Diagnosis not present

## 2018-07-05 DIAGNOSIS — Z8673 Personal history of transient ischemic attack (TIA), and cerebral infarction without residual deficits: Secondary | ICD-10-CM | POA: Diagnosis not present

## 2018-07-05 DIAGNOSIS — Z7901 Long term (current) use of anticoagulants: Secondary | ICD-10-CM | POA: Diagnosis not present

## 2018-07-05 DIAGNOSIS — Z79899 Other long term (current) drug therapy: Secondary | ICD-10-CM | POA: Diagnosis not present

## 2018-07-05 DIAGNOSIS — E039 Hypothyroidism, unspecified: Secondary | ICD-10-CM | POA: Diagnosis not present

## 2018-07-05 DIAGNOSIS — K625 Hemorrhage of anus and rectum: Secondary | ICD-10-CM | POA: Diagnosis not present

## 2018-07-05 DIAGNOSIS — D6489 Other specified anemias: Secondary | ICD-10-CM | POA: Diagnosis not present

## 2018-07-05 DIAGNOSIS — I5181 Takotsubo syndrome: Secondary | ICD-10-CM | POA: Diagnosis not present

## 2018-07-05 DIAGNOSIS — E1140 Type 2 diabetes mellitus with diabetic neuropathy, unspecified: Secondary | ICD-10-CM | POA: Diagnosis not present

## 2018-07-05 DIAGNOSIS — I482 Chronic atrial fibrillation, unspecified: Secondary | ICD-10-CM | POA: Diagnosis not present

## 2018-07-05 DIAGNOSIS — I252 Old myocardial infarction: Secondary | ICD-10-CM | POA: Diagnosis not present

## 2018-07-05 DIAGNOSIS — K219 Gastro-esophageal reflux disease without esophagitis: Secondary | ICD-10-CM | POA: Diagnosis not present

## 2018-07-05 DIAGNOSIS — Z800 Family history of malignant neoplasm of digestive organs: Secondary | ICD-10-CM | POA: Diagnosis not present

## 2018-07-05 DIAGNOSIS — K648 Other hemorrhoids: Secondary | ICD-10-CM | POA: Diagnosis not present

## 2018-07-05 DIAGNOSIS — K449 Diaphragmatic hernia without obstruction or gangrene: Secondary | ICD-10-CM | POA: Diagnosis not present

## 2018-07-06 DIAGNOSIS — Z8673 Personal history of transient ischemic attack (TIA), and cerebral infarction without residual deficits: Secondary | ICD-10-CM | POA: Diagnosis not present

## 2018-07-06 DIAGNOSIS — I482 Chronic atrial fibrillation, unspecified: Secondary | ICD-10-CM | POA: Diagnosis not present

## 2018-07-06 DIAGNOSIS — K760 Fatty (change of) liver, not elsewhere classified: Secondary | ICD-10-CM | POA: Diagnosis not present

## 2018-07-06 DIAGNOSIS — I517 Cardiomegaly: Secondary | ICD-10-CM | POA: Diagnosis not present

## 2018-07-06 DIAGNOSIS — K449 Diaphragmatic hernia without obstruction or gangrene: Secondary | ICD-10-CM | POA: Diagnosis not present

## 2018-07-06 DIAGNOSIS — D649 Anemia, unspecified: Secondary | ICD-10-CM | POA: Diagnosis not present

## 2018-07-06 DIAGNOSIS — I313 Pericardial effusion (noninflammatory): Secondary | ICD-10-CM | POA: Diagnosis not present

## 2018-07-06 DIAGNOSIS — K625 Hemorrhage of anus and rectum: Secondary | ICD-10-CM | POA: Diagnosis not present

## 2018-07-07 DIAGNOSIS — Z8673 Personal history of transient ischemic attack (TIA), and cerebral infarction without residual deficits: Secondary | ICD-10-CM | POA: Diagnosis not present

## 2018-07-07 DIAGNOSIS — I480 Paroxysmal atrial fibrillation: Secondary | ICD-10-CM | POA: Diagnosis not present

## 2018-07-07 DIAGNOSIS — D649 Anemia, unspecified: Secondary | ICD-10-CM | POA: Diagnosis not present

## 2018-07-07 DIAGNOSIS — I313 Pericardial effusion (noninflammatory): Secondary | ICD-10-CM | POA: Diagnosis not present

## 2018-07-07 DIAGNOSIS — I5181 Takotsubo syndrome: Secondary | ICD-10-CM | POA: Diagnosis not present

## 2018-07-07 DIAGNOSIS — K625 Hemorrhage of anus and rectum: Secondary | ICD-10-CM | POA: Diagnosis not present

## 2018-07-07 DIAGNOSIS — I482 Chronic atrial fibrillation, unspecified: Secondary | ICD-10-CM | POA: Diagnosis not present

## 2018-07-08 DIAGNOSIS — Z8673 Personal history of transient ischemic attack (TIA), and cerebral infarction without residual deficits: Secondary | ICD-10-CM | POA: Diagnosis not present

## 2018-07-08 DIAGNOSIS — I480 Paroxysmal atrial fibrillation: Secondary | ICD-10-CM | POA: Diagnosis not present

## 2018-07-08 DIAGNOSIS — I482 Chronic atrial fibrillation, unspecified: Secondary | ICD-10-CM | POA: Diagnosis not present

## 2018-07-08 DIAGNOSIS — I5181 Takotsubo syndrome: Secondary | ICD-10-CM | POA: Diagnosis not present

## 2018-07-08 DIAGNOSIS — I313 Pericardial effusion (noninflammatory): Secondary | ICD-10-CM | POA: Diagnosis not present

## 2018-07-08 DIAGNOSIS — K625 Hemorrhage of anus and rectum: Secondary | ICD-10-CM | POA: Diagnosis not present

## 2018-07-08 DIAGNOSIS — D649 Anemia, unspecified: Secondary | ICD-10-CM | POA: Diagnosis not present

## 2018-07-09 DIAGNOSIS — K625 Hemorrhage of anus and rectum: Secondary | ICD-10-CM | POA: Diagnosis not present

## 2018-07-15 ENCOUNTER — Inpatient Hospital Stay: Payer: Medicare Other | Admitting: Adult Health

## 2018-07-15 ENCOUNTER — Telehealth: Payer: Self-pay | Admitting: Internal Medicine

## 2018-07-15 DIAGNOSIS — I10 Essential (primary) hypertension: Secondary | ICD-10-CM | POA: Diagnosis not present

## 2018-07-15 DIAGNOSIS — I480 Paroxysmal atrial fibrillation: Secondary | ICD-10-CM | POA: Diagnosis not present

## 2018-07-15 DIAGNOSIS — I63412 Cerebral infarction due to embolism of left middle cerebral artery: Secondary | ICD-10-CM | POA: Diagnosis not present

## 2018-07-15 NOTE — Telephone Encounter (Signed)
Probably best to have her go to the AF clinic.

## 2018-07-15 NOTE — Telephone Encounter (Signed)
Message was left on my VM from Pat at [facility]. She states pt needs an asap EPH appt with [facility]. She was in the ED 06-18-18 for rectal bleeding. Fraser Din states pt wants to change from Eliquis. Can this change be done over the phone or does pt need an asap appt with [facility]. Fraser Din can also be reached if needed @ [phone].

## 2018-07-16 ENCOUNTER — Encounter (HOSPITAL_COMMUNITY): Payer: Self-pay | Admitting: Physician Assistant

## 2018-07-16 ENCOUNTER — Ambulatory Visit (HOSPITAL_COMMUNITY)
Admission: RE | Admit: 2018-07-16 | Discharge: 2018-07-16 | Disposition: A | Discharge status: Home / Self Care | Payer: Medicare Other | Source: Ambulatory Visit | Attending: Physician Assistant | Admitting: Physician Assistant

## 2018-07-16 VITALS — BP 162/74 | HR 50 | Ht 67.0 in | Wt 199.0 lb

## 2018-07-16 DIAGNOSIS — I129 Hypertensive chronic kidney disease with stage 1 through stage 4 chronic kidney disease, or unspecified chronic kidney disease: Secondary | ICD-10-CM | POA: Insufficient documentation

## 2018-07-16 DIAGNOSIS — E1122 Type 2 diabetes mellitus with diabetic chronic kidney disease: Secondary | ICD-10-CM | POA: Diagnosis not present

## 2018-07-16 DIAGNOSIS — I4891 Unspecified atrial fibrillation: Secondary | ICD-10-CM | POA: Insufficient documentation

## 2018-07-16 DIAGNOSIS — Z853 Personal history of malignant neoplasm of breast: Secondary | ICD-10-CM | POA: Insufficient documentation

## 2018-07-16 DIAGNOSIS — I480 Paroxysmal atrial fibrillation: Secondary | ICD-10-CM | POA: Diagnosis not present

## 2018-07-16 DIAGNOSIS — Z9011 Acquired absence of right breast and nipple: Secondary | ICD-10-CM | POA: Insufficient documentation

## 2018-07-16 DIAGNOSIS — E669 Obesity, unspecified: Secondary | ICD-10-CM | POA: Insufficient documentation

## 2018-07-16 DIAGNOSIS — E039 Hypothyroidism, unspecified: Secondary | ICD-10-CM | POA: Diagnosis not present

## 2018-07-16 DIAGNOSIS — Z6831 Body mass index (BMI) 31.0-31.9, adult: Secondary | ICD-10-CM | POA: Insufficient documentation

## 2018-07-16 DIAGNOSIS — Z888 Allergy status to other drugs, medicaments and biological substances status: Secondary | ICD-10-CM | POA: Diagnosis not present

## 2018-07-16 DIAGNOSIS — Z880 Allergy status to penicillin: Secondary | ICD-10-CM | POA: Insufficient documentation

## 2018-07-16 DIAGNOSIS — Z8673 Personal history of transient ischemic attack (TIA), and cerebral infarction without residual deficits: Secondary | ICD-10-CM | POA: Insufficient documentation

## 2018-07-16 DIAGNOSIS — I5181 Takotsubo syndrome: Secondary | ICD-10-CM | POA: Insufficient documentation

## 2018-07-16 DIAGNOSIS — Z79899 Other long term (current) drug therapy: Secondary | ICD-10-CM | POA: Insufficient documentation

## 2018-07-16 DIAGNOSIS — E785 Hyperlipidemia, unspecified: Secondary | ICD-10-CM | POA: Diagnosis not present

## 2018-07-16 DIAGNOSIS — N183 Chronic kidney disease, stage 3 (moderate): Secondary | ICD-10-CM | POA: Diagnosis not present

## 2018-07-16 DIAGNOSIS — Z79890 Hormone replacement therapy (postmenopausal): Secondary | ICD-10-CM | POA: Diagnosis not present

## 2018-07-16 DIAGNOSIS — R001 Bradycardia, unspecified: Secondary | ICD-10-CM | POA: Insufficient documentation

## 2018-07-16 DIAGNOSIS — Z7984 Long term (current) use of oral hypoglycemic drugs: Secondary | ICD-10-CM | POA: Insufficient documentation

## 2018-07-16 DIAGNOSIS — Z7901 Long term (current) use of anticoagulants: Secondary | ICD-10-CM | POA: Diagnosis not present

## 2018-07-16 DIAGNOSIS — I252 Old myocardial infarction: Secondary | ICD-10-CM | POA: Insufficient documentation

## 2018-07-16 DIAGNOSIS — D649 Anemia, unspecified: Secondary | ICD-10-CM | POA: Diagnosis not present

## 2018-07-16 MED ORDER — RIVAROXABAN 20 MG PO TABS: 20.0000 mg | ORAL_TABLET | Freq: Every day | ORAL | 6 refills | Status: DC

## 2018-07-16 MED ORDER — RIVAROXABAN 20 MG PO TABS: 20.0000 mg | ORAL_TABLET | Freq: Every day | ORAL | 0 refills | Status: DC

## 2018-07-16 NOTE — Telephone Encounter (Signed)
appt made for today at 3pm. Pt states she has been off her eliquis for a week due to making her "feel crazy and not know her children."

## 2018-07-16 NOTE — Telephone Encounter (Signed)
LM to call back.

## 2018-07-16 NOTE — Patient Instructions (Addendum)
Stop eliquis  Start Xarelto 20mg  once a day WITH SUPPER  Bring your medication list/bottles with next visit

## 2018-07-16 NOTE — Progress Notes (Signed)
Primary Care Physician: Jani Gravel, MD Primary Electrophysiologist: Dr Rayann Heman Referring Physician: Dr Otho Perl is a 78 y.o. female with a history of HLD, HTN, DM, hypothyroidism, depression, paroxysmal atrial fibrillation, CKD II, breast cancer, CVA, chronic anemia, and Takotsubo cardiomyopathy who presents for consultation in the [facility]. The patient is a difficult historian. The patient was initially diagnosed with atrial fibrillation in 2012 after presenting with symptoms of presyncope. She has done very well since that time. Was maintained on warfarin. She admitted 04/2018 after a fall and was found to have a severely reduced EF 20-25% and underwent LHC which showed unremarkable coronaries and stress cardiomyopathy diagnosed. Unfortunately, she had a CVA post catheterization 2/2 being off warfarin. She was started on Eliquis but patient reports that this medicine "made her crazy to the point that she didn't know who her children were." She stopped the Eliquis on her own about one week ago. No heart racing or palpitations.  Today, she denies symptoms of palpitations, chest pain, shortness of breath, orthopnea, PND, lower extremity edema, dizziness, presyncope, syncope, snoring, daytime somnolence, bleeding. The patient is tolerating medications without difficulties and is otherwise without complaint today.    Atrial Fibrillation Risk Factors:  she does not have a history of rheumatic fever. she does not have a history of alcohol use. The patient does not have a history of early familial atrial fibrillation or other arrhythmias.  she has a BMI of Body mass index is 31.17 kg/m.Marland Kitchen Filed Weights   07/16/18 1443  Weight: 90.3 kg    Family History  Problem Relation Age of Onset  . Depression Mother   . Alcohol abuse Father   . Heart attack Brother 89  . Cancer Other        Uncle, not specific  . Cancer Other        Aunt, not specific. GYN  CA  . Breast cancer Other        Aunt, not specific  . Diabetes Other        Grandmother, not specific  . Cancer Maternal Grandfather        colon     Atrial Fibrillation Management history:  Previous antiarrhythmic drugs: none Previous cardioversions: none Previous ablations: none CHADS2VASC score: 7 (female, age, CVA, HTN, CHF) Anticoagulation history: warfarin, Eliquis   Past Medical History:  Diagnosis Date  . Anemia 12/18/2011  . Atrial fibrillation ([place]) 04/13/2011  . Cancer [facility])    breast CA s/p Right mastectomy  . Diabetes mellitus, type 2 (HCC)    diet controlled  . Hx of echocardiogram    Echocardiogram (12/15): Moderate LVH, EF 55-60%, normal wall motion, grade 1 diastolic dysfunction, aortic sclerosis without stenosis, mild AI, MAC  . Hyperlipidemia   . Hypertension    medicine refractory  . Hypothyroidism    s/p thryroid gland ablation  . Neuromuscular disorder ([place])   . NSTEMI (non-ST elevated myocardial infarction) ([place]) 05/13/2018  . Osteopenia   . Renal insufficiency, mild 09/23/2013  . Stroke [facility]) 05/15/2018   Past Surgical History:  Procedure Laterality Date  . BREAST ENHANCEMENT SURGERY    . BREAST IMPLANT REMOVAL  2000   Unilaterally   . BREAST SURGERY    . CHOLECYSTECTOMY    . COLONOSCOPY  2002   Neg  . CORONARY ANGIOGRAPHY N/A 05/15/2018   Procedure: CORONARY ANGIOGRAPHY;  Surgeon: Burnell Blanks, MD;  Location: [place] [facility];  Service: Cardiovascular;  Laterality:  N/A;  . IR ANGIO INTRA EXTRACRAN SEL COM CAROTID INNOMINATE BILAT MOD SED  05/15/2018  . IR ANGIO VERTEBRAL SEL SUBCLAVIAN INNOMINATE UNI R MOD SED  05/15/2018  . MASTECTOMY    . RADIOLOGY WITH ANESTHESIA N/A 05/15/2018   Procedure: IR WITH ANESTHESIA;  Surgeon: Luanne Bras, MD;  Location: [place];  Service: Radiology;  Laterality: N/A;  . RAI  04/04/2006  . TUBAL LIGATION      Current Outpatient Medications  Medication Sig Dispense Refill  . carvedilol  (COREG) 3.125 MG tablet Take 1 tablet by mouth 2 (two) times daily.    . Cholecalciferol (VITAMIN D-3) 1000 UNITS CAPS Take 1 capsule by mouth daily.    . citalopram (CELEXA) 20 MG tablet Take 20 mg by mouth daily.    . Coenzyme Q10 (CO Q 10 PO) Take 1 capsule by mouth daily.    . feeding supplement, ENSURE ENLIVE, (ENSURE ENLIVE) LIQD Take 237 mLs by mouth 2 (two) times daily between meals. 237 mL 12  . Flaxseed, Linseed, (FLAXSEED OIL PO) Take 1 tablet by mouth daily.    Marland Kitchen gabapentin (NEURONTIN) 300 MG capsule     . hydrALAZINE (APRESOLINE) 25 MG tablet Take 12.5 mg by mouth 2 (two) times daily.    Marland Kitchen levothyroxine (SYNTHROID, LEVOTHROID) 112 MCG tablet Take 1 tablet (112 mcg) by mouth daily. EXCEPT Tuesdays and Thursdays take 1/2 tablet (56 mcg) by mouth daily.    Marland Kitchen losartan (COZAAR) 100 MG tablet Take 100 mg by mouth daily.    . meclizine (ANTIVERT) 25 MG tablet Take 25 mg by mouth 3 (three) times daily as needed for dizziness.    . metFORMIN (GLUCOPHAGE) 1000 MG tablet Take 500 mg by mouth 2 (two) times daily.    . Multiple Vitamins-Minerals (MULTIVITAMINS) CHEW Chew 1 tablet by mouth daily.    . nitroGLYCERIN (NITROSTAT) 0.4 MG SL tablet Place 1 tablet (0.4 mg total) under the tongue every 5 (five) minutes as needed for chest pain. 30 tablet 2  . Omega-3 Fatty Acids (FISH OIL PO) Take 1 capsule by mouth daily.    . pravastatin (PRAVACHOL) 80 MG tablet Take 80 mg by mouth daily.     . vitamin B-12 (CYANOCOBALAMIN) 1000 MCG tablet Take 1,000 mcg by mouth daily.    . rivaroxaban (XARELTO) 20 MG TABS tablet Take 1 tablet (20 mg total) by mouth daily with supper. 30 tablet 0   No current facility-administered medications for this encounter.     Allergies  Allergen Reactions  . Lisinopril     ? Angioedema (NOT ARB candidate) Because of a history of documented adverse serious drug reaction;Medi Alert bracelet  is recommended  . Hctz [Hydrochlorothiazide]     hyponatremia  . Spironolactone      REACTION: low potassium ???  . Penicillins Other (See Comments)    Pt states medication doesn't work for her d/t her usage of it several years ago.  . Amlodipine Besylate     REACTION: edema    Social History   Socioeconomic History  . Marital status: Divorced    Spouse name: Not on file  . Number of children: Not on file  . Years of education: Not on file  . Highest education level: Not on file  Occupational History  . Not on file  Social Needs  . Financial resource strain: Not on file  . Food insecurity:    Worry: Not on file    Inability: Not on file  . Transportation  needs:    Medical: Not on file    Non-medical: Not on file  Tobacco Use  . Smoking status: Never Smoker  . Smokeless tobacco: Never Used  Substance and Sexual Activity  . Alcohol use: No  . Drug use: No  . Sexual activity: Never  Lifestyle  . Physical activity:    Days per week: Not on file    Minutes per session: Not on file  . Stress: Not on file  Relationships  . Social connections:    Talks on phone: Not on file    Gets together: Not on file    Attends religious service: Not on file    Active member of club or organization: Not on file    Attends meetings of clubs or organizations: Not on file    Relationship status: Not on file  . Intimate partner violence:    Fear of current or ex partner: Not on file    Emotionally abused: Not on file    Physically abused: Not on file    Forced sexual activity: Not on file  Other Topics Concern  . Not on file  Social History Narrative  . Not on file     ROS- All systems are reviewed and negative except as per the HPI above.  Physical Exam: Vitals:   07/16/18 1443  BP: (!) 162/74  Pulse: (!) 50  Weight: 90.3 kg  Height: _0  (1.702 m)    GEN- The patient is well appearing obese, elderly female, alert Head- normocephalic, atraumatic Eyes-  Sclera clear, conjunctiva pink Ears- hearing intact Oropharynx- clear Neck- supple  Lungs-  Clear to ausculation bilaterally, normal work of breathing Heart- Regular rate and rhythm, no murmurs, rubs or gallops  GI- soft, NT, ND, + BS Extremities- no clubbing, cyanosis, or edema MS- no significant deformity or atrophy Skin- no rash or lesion Psych- euthymic mood, full affect Neuro- strength and sensation are intact  Wt Readings from Last 3 Encounters:  07/16/18 90.3 kg  06/29/18 90.7 kg  05/22/18 97.2 kg    EKG today demonstrates sinus bradycardia HR 50, 1st degree AV block, LAD, PR 212, QRS 90, QTc 415, motion artifact  Echo 05/13/18 demonstrated  - Left ventricle: The cavity size was mildly dilated. Wall   thickness was increased in a pattern of mild LVH. Systolic   function was severely reduced. The estimated ejection fraction   was in the range of 20% to 25%. There is akinesis of the   apicalanteroseptal, anterior, and inferior myocardium. Doppler   parameters are consistent with abnormal left ventricular   relaxation (grade 1 diastolic dysfunction). Doppler parameters   are consistent with high ventricular filling pressure. - Aortic valve: There was mild regurgitation. - Left atrium: The atrium was mildly dilated. - Right ventricle: The cavity size was mildly dilated. Systolic   function was mildly to moderately reduced. - Pulmonary arteries: PA peak pressure: 45 mm Hg (S). - Pericardium, extracardiac: A trivial pericardial effusion was   identified posterior to the heart.  Epic records are reviewed at length today  Assessment and Plan:  1. Paroxysmal atrial fibrillation The patient has paroxysmal atrial fibrillation. Appears to be maintaining SR. Patient stopped her Eliquis about one week ago 2/2 side effects. We had a long discussion about her stroke risk and the importance of remaining on anticoagulation.  Will start Xarelto 20 mg daily.  Check CBC/Bmet in one month.  This patients CHA2DS2-VASc Score and unadjusted Ischemic Stroke Rate (% per year)  is  equal to 11.2 % stroke rate/year from a score of 7  Above score calculated as 1 point each if present [CHF, HTN, DM, Vascular=MI/PAD/Aortic Plaque, Age if 65-74, or Female] Above score calculated as 2 points each if present [Age > 75, or Stroke/TIA/TE]   2. Obesity Body mass index is 31.17 kg/m. Lifestyle modification was discussed at length including regular exercise and weight reduction.  3. HTN BP is elevated at today's visit. WNL at recent neurology visit. Patient unsure of which HTN medications she is taking. Encouraged her to bring her medication bottles to her next visit so we can titrate appropriately.  4. Sinus Bradycardia Asymptomatic, no changes  5. Stress Cardiomyopathy Repeat echo in one month. Continue Coreg and ARB. No signs of fluid overload.   Follow up in one month in Afib Clinic then will have her f/u with Dr Rayann Heman.  [facility] [address] [place], [place] [address] [phone] 07/16/2018 3:37 PM

## 2018-07-21 ENCOUNTER — Telehealth: Payer: Self-pay | Admitting: Adult Health

## 2018-07-21 NOTE — Telephone Encounter (Signed)
Called and asked patient and left her a message asking her to call me back I wanted to ask her if her Home Health Has started yet.

## 2018-07-27 ENCOUNTER — Telehealth: Payer: Self-pay | Admitting: Adult Health

## 2018-07-27 NOTE — Telephone Encounter (Signed)
Patient relayed she want's want's to hold off on -- Physical Therapy and Speech Language Pathology . Her son has had a stroke and she is taking care of him right now. Patient will call back. Thanks Hinton Dyer

## 2018-08-12 ENCOUNTER — Ambulatory Visit (HOSPITAL_COMMUNITY): Payer: Medicare Other | Admitting: Nurse Practitioner

## 2018-09-03 ENCOUNTER — Ambulatory Visit (HOSPITAL_COMMUNITY)
Admission: RE | Admit: 2018-09-03 | Discharge: 2018-09-03 | Disposition: A | Discharge status: Home / Self Care | Payer: Medicare Other | Source: Ambulatory Visit | Attending: Physician Assistant | Admitting: Physician Assistant

## 2018-09-03 ENCOUNTER — Other Ambulatory Visit: Payer: Self-pay

## 2018-09-03 DIAGNOSIS — I480 Paroxysmal atrial fibrillation: Secondary | ICD-10-CM

## 2018-09-03 NOTE — Progress Notes (Signed)
Electrophysiology TeleHealth Note   Due to national recommendations of social distancing due to [place] 19, Audio/video telehealth visit is felt to be most appropriate for this patient at this time.  See MyChart message from today for patient consent regarding telehealth for the Atrial Fibrillation Clinic.    Date:  09/03/2018   ID:  Gloria Joseph, DOB 03-07-1941, MRN [id number]  Location: home  Provider location: [address] [place], [place] [address] Evaluation Performed: Follow up  PCP:  Jani Gravel, MD  Primary Electrophysiologist: Dr Rayann Heman   CC: Follow up for atrial fibrillation   History of Present Illness: Gloria Joseph is a 78 y.o. female who presents via audio/video conferencing for a telehealth visit today. Patient reports that physically she is doing better now than she has for several months. She denies SOB and is walking on a regular basis. No noted side effects with Xarelto. Unfortunately, her son recently passed away from stroke which has been understandably distressing to her.   Today, she denies symptoms of palpitations, chest pain, shortness of breath, orthopnea, PND, lower extremity edema, claudication, dizziness, presyncope, syncope, bleeding, or neurologic sequela. The patient is tolerating medications without difficulties and is otherwise without complaint today.   she denies symptoms of cough, fevers, chills, or new SOB worrisome for COVID 19.     Atrial Fibrillation Risk Factors:  she does not have a history of rheumatic fever. she does not have a history of alcohol use. The patient does not have a history of early familial atrial fibrillation or other arrhythmias.  she has a BMI of There is no height or weight on file to calculate BMI.. There were no vitals filed for this visit.  Past Medical History:  Diagnosis Date  . Anemia 12/18/2011  . Atrial fibrillation ([place]) 04/13/2011  . Cancer [facility])    breast CA s/p Right mastectomy  . Diabetes  mellitus, type 2 (HCC)    diet controlled  . Hx of echocardiogram    Echocardiogram (12/15): Moderate LVH, EF 55-60%, normal wall motion, grade 1 diastolic dysfunction, aortic sclerosis without stenosis, mild AI, MAC  . Hyperlipidemia   . Hypertension    medicine refractory  . Hypothyroidism    s/p thryroid gland ablation  . Neuromuscular disorder ([place])   . NSTEMI (non-ST elevated myocardial infarction) ([place]) 05/13/2018  . Osteopenia   . Renal insufficiency, mild 09/23/2013  . Stroke [facility]) 05/15/2018   Past Surgical History:  Procedure Laterality Date  . BREAST ENHANCEMENT SURGERY    . BREAST IMPLANT REMOVAL  2000   Unilaterally   . BREAST SURGERY    . CHOLECYSTECTOMY    . COLONOSCOPY  2002   Neg  . CORONARY ANGIOGRAPHY N/A 05/15/2018   Procedure: CORONARY ANGIOGRAPHY;  Surgeon: Burnell Blanks, MD;  Location: [place] [facility];  Service: Cardiovascular;  Laterality: N/A;  . IR ANGIO INTRA EXTRACRAN SEL COM CAROTID INNOMINATE BILAT MOD SED  05/15/2018  . IR ANGIO VERTEBRAL SEL SUBCLAVIAN INNOMINATE UNI R MOD SED  05/15/2018  . MASTECTOMY    . RADIOLOGY WITH ANESTHESIA N/A 05/15/2018   Procedure: IR WITH ANESTHESIA;  Surgeon: Luanne Bras, MD;  Location: [place];  Service: Radiology;  Laterality: N/A;  . RAI  04/04/2006  . TUBAL LIGATION       Current Outpatient Medications  Medication Sig Dispense Refill  . carvedilol (COREG) 3.125 MG tablet Take 1 tablet by mouth 2 (two) times daily.    . Cholecalciferol (VITAMIN D-3)  1000 UNITS CAPS Take 1 capsule by mouth daily.    . citalopram (CELEXA) 20 MG tablet Take 20 mg by mouth daily.    . Coenzyme Q10 (CO Q 10 PO) Take 1 capsule by mouth daily.    . feeding supplement, ENSURE ENLIVE, (ENSURE ENLIVE) LIQD Take 237 mLs by mouth 2 (two) times daily between meals. 237 mL 12  . Flaxseed, Linseed, (FLAXSEED OIL PO) Take 1 tablet by mouth daily.    Marland Kitchen gabapentin (NEURONTIN) 300 MG capsule     . hydrALAZINE (APRESOLINE) 25  MG tablet Take 12.5 mg by mouth 2 (two) times daily.    Marland Kitchen levothyroxine (SYNTHROID, LEVOTHROID) 112 MCG tablet Take 1 tablet (112 mcg) by mouth daily. EXCEPT Tuesdays and Thursdays take 1/2 tablet (56 mcg) by mouth daily.    Marland Kitchen losartan (COZAAR) 100 MG tablet Take 100 mg by mouth daily.    . meclizine (ANTIVERT) 25 MG tablet Take 25 mg by mouth 3 (three) times daily as needed for dizziness.    . metFORMIN (GLUCOPHAGE) 1000 MG tablet Take 500 mg by mouth 2 (two) times daily.    . Multiple Vitamins-Minerals (MULTIVITAMINS) CHEW Chew 1 tablet by mouth daily.    . nitroGLYCERIN (NITROSTAT) 0.4 MG SL tablet Place 1 tablet (0.4 mg total) under the tongue every 5 (five) minutes as needed for chest pain. 30 tablet 2  . Omega-3 Fatty Acids (FISH OIL PO) Take 1 capsule by mouth daily.    . pravastatin (PRAVACHOL) 80 MG tablet Take 80 mg by mouth daily.     . rivaroxaban (XARELTO) 20 MG TABS tablet Take 1 tablet (20 mg total) by mouth daily with supper. 30 tablet 6  . vitamin B-12 (CYANOCOBALAMIN) 1000 MCG tablet Take 1,000 mcg by mouth daily.     No current facility-administered medications for this encounter.     Allergies:   Lisinopril; Hctz [hydrochlorothiazide]; Spironolactone; Penicillins; and Amlodipine besylate   Social History:  The patient  reports that she has never smoked. She has never used smokeless tobacco. She reports that she does not drink alcohol or use drugs.   Family History:  The patient's  family history includes Alcohol abuse in her father; Breast cancer in an other family member; Cancer in her maternal grandfather and other family members; Depression in her mother; Diabetes in an other family member; Heart attack (age of onset: 72) in her brother.    ROS:  Please see the history of present illness.   All other systems are personally reviewed and negative.   Exam: Well appearing, alert and conversant, regular work of breathing,  good skin color  Recent Labs: 05/16/2018: TSH  0.920 05/20/2018: B Natriuretic Peptide 864.1; Hemoglobin 7.8; Platelets 251 05/22/2018: BUN 9; Creatinine, Ser 1.06; Potassium 3.9; Sodium 137  personally reviewed    Other studies personally reviewed: Additional studies/ records that were reviewed today include: Epic notes    ASSESSMENT AND PLAN:  1. Paroxsyaml atrial fibrillation Patient appears to be maintaining SR. No symptomatic episodes. Continue Xarelto 20 mg daily Continue Coreg 3.125 mg BID  This patients CHA2DS2-VASc Score and unadjusted Ischemic Stroke Rate (% per year) is equal to 11.2 % stroke rate/year from a score of 7  Above score calculated as 1 point each if present [CHF, HTN, DM, Vascular=MI/PAD/Aortic Plaque, Age if 65-74, or Female] Above score calculated as 2 points each if present [Age > 75, or Stroke/TIA/TE]  2. Obesity Lifestyle modification was discussed and encouraged including regular physical activity and  weight reduction. Patient has been walking regularly.   3. HTN BP elevated today, possibly 2/2 recent life stressors. Patient reports she checks BP regularly and it is usually within normal range. Patient to continue BP log. No changes today.  4. Sinus Bradycardia Asymptomatic, no changes  5. Stress CM No symptoms of heart failure. Continue Coreg and ARB. Follow up echo once COVID-19 precautions end.  COVID screen The patient does not have any symptoms that suggest any further testing/ screening at this time.  Social distancing reinforced today.    Follow-up with Dr Rayann Heman in 2 months.  Current medicines are reviewed at length with the patient today.   The patient does not have concerns regarding her medicines.  The following changes were made today:  none  Labs/ tests ordered today include:  No orders of the defined types were placed in this encounter.   Patient Risk:  after full review of this patients clinical status, I feel that they are at moderate risk at this time.   Today, I have  spent 22 minutes with the patient with telehealth technology discussing atrial fibrillation, lifestyle modifications, COVID-19 precautions, and patient's recent family loss.    Gwenlyn Perking PA-C 09/03/2018 2:41 PM  Af[facility] [address] [place],  [address] [phone]

## 2018-09-18 DIAGNOSIS — E785 Hyperlipidemia, unspecified: Secondary | ICD-10-CM | POA: Diagnosis not present

## 2018-09-18 DIAGNOSIS — I10 Essential (primary) hypertension: Secondary | ICD-10-CM | POA: Diagnosis not present

## 2018-09-18 DIAGNOSIS — E039 Hypothyroidism, unspecified: Secondary | ICD-10-CM | POA: Diagnosis not present

## 2018-09-18 DIAGNOSIS — Z131 Encounter for screening for diabetes mellitus: Secondary | ICD-10-CM | POA: Diagnosis not present

## 2018-09-24 DIAGNOSIS — E7439 Other disorders of intestinal carbohydrate absorption: Secondary | ICD-10-CM | POA: Diagnosis not present

## 2018-09-24 DIAGNOSIS — Z23 Encounter for immunization: Secondary | ICD-10-CM | POA: Diagnosis not present

## 2018-09-24 DIAGNOSIS — E039 Hypothyroidism, unspecified: Secondary | ICD-10-CM | POA: Diagnosis not present

## 2018-09-24 DIAGNOSIS — I10 Essential (primary) hypertension: Secondary | ICD-10-CM | POA: Diagnosis not present

## 2018-09-24 DIAGNOSIS — E785 Hyperlipidemia, unspecified: Secondary | ICD-10-CM | POA: Diagnosis not present

## 2018-09-24 DIAGNOSIS — I480 Paroxysmal atrial fibrillation: Secondary | ICD-10-CM | POA: Diagnosis not present

## 2018-11-02 ENCOUNTER — Telehealth: Payer: Self-pay

## 2018-11-03 ENCOUNTER — Other Ambulatory Visit: Payer: Self-pay

## 2018-11-03 NOTE — Patient Outreach (Signed)
[facility]) Care Management  11/03/2018  Gloria Joseph 11-22-1940 [id number]   Medication Adherence call to Mrs. Gloria Joseph HIPPA Compliant Voice message left with a call back number. Mrs. Gloria Joseph is showing past due on Losartan 100 mg under [place].   [place] Management Direct Dial [phone]  Fax [phone] [email or web address]

## 2018-11-04 NOTE — Telephone Encounter (Signed)
Left message regarding appt on 11/05/18.

## 2018-11-05 ENCOUNTER — Telehealth: Payer: Medicare Other | Admitting: Internal Medicine

## 2018-11-05 NOTE — Progress Notes (Unsigned)
Patient did not show for virtual visit. I called patient and got VM  Will need to reschedule for another time.  Thompson Grayer MD, [place] 11/05/2018 12:13 PM

## 2019-03-19 ENCOUNTER — Other Ambulatory Visit (HOSPITAL_COMMUNITY): Payer: Self-pay | Admitting: Physician Assistant

## 2019-03-22 DIAGNOSIS — E785 Hyperlipidemia, unspecified: Secondary | ICD-10-CM | POA: Diagnosis not present

## 2019-03-22 DIAGNOSIS — I10 Essential (primary) hypertension: Secondary | ICD-10-CM | POA: Diagnosis not present

## 2019-03-22 DIAGNOSIS — D509 Iron deficiency anemia, unspecified: Secondary | ICD-10-CM | POA: Diagnosis not present

## 2019-03-22 DIAGNOSIS — R739 Hyperglycemia, unspecified: Secondary | ICD-10-CM | POA: Diagnosis not present

## 2019-03-29 DIAGNOSIS — I480 Paroxysmal atrial fibrillation: Secondary | ICD-10-CM | POA: Diagnosis not present

## 2019-03-29 DIAGNOSIS — I10 Essential (primary) hypertension: Secondary | ICD-10-CM | POA: Diagnosis not present

## 2019-03-29 DIAGNOSIS — E119 Type 2 diabetes mellitus without complications: Secondary | ICD-10-CM | POA: Diagnosis not present

## 2019-03-29 DIAGNOSIS — D649 Anemia, unspecified: Secondary | ICD-10-CM | POA: Diagnosis not present

## 2019-03-29 DIAGNOSIS — I63412 Cerebral infarction due to embolism of left middle cerebral artery: Secondary | ICD-10-CM | POA: Diagnosis not present

## 2019-06-09 DIAGNOSIS — M25512 Pain in left shoulder: Secondary | ICD-10-CM | POA: Diagnosis not present

## 2019-06-10 DIAGNOSIS — Z7901 Long term (current) use of anticoagulants: Secondary | ICD-10-CM | POA: Diagnosis not present

## 2019-06-10 DIAGNOSIS — M7502 Adhesive capsulitis of left shoulder: Secondary | ICD-10-CM | POA: Diagnosis not present

## 2019-06-10 DIAGNOSIS — E119 Type 2 diabetes mellitus without complications: Secondary | ICD-10-CM | POA: Diagnosis not present

## 2019-06-10 DIAGNOSIS — M25512 Pain in left shoulder: Secondary | ICD-10-CM | POA: Diagnosis not present

## 2019-06-10 DIAGNOSIS — M7542 Impingement syndrome of left shoulder: Secondary | ICD-10-CM | POA: Diagnosis not present

## 2019-06-22 DIAGNOSIS — M25512 Pain in left shoulder: Secondary | ICD-10-CM | POA: Diagnosis not present

## 2019-06-24 DIAGNOSIS — I10 Essential (primary) hypertension: Secondary | ICD-10-CM | POA: Diagnosis not present

## 2019-06-24 DIAGNOSIS — D649 Anemia, unspecified: Secondary | ICD-10-CM | POA: Diagnosis not present

## 2019-06-24 DIAGNOSIS — E119 Type 2 diabetes mellitus without complications: Secondary | ICD-10-CM | POA: Diagnosis not present

## 2019-06-25 IMAGING — CT CT HEAD CODE STROKE
4 series · 16 of 47 positions shown, 18 images · non-contrast
Comparison: 05/13/2018 brain MRI and CT

CLINICAL DATA: Code stroke.  Expressive aphasia.

EXAM:
CT HEAD WITHOUT CONTRAST
TECHNIQUE: Contiguous axial images were obtained from the base of the skull
through the vertex without intravenous contrast.

[Series 3: head wo · axial · 0.45mm/px · z∈[-148,-3]mm · 7 of 39 slices shown, 9 images]
[im 5/39  brain]
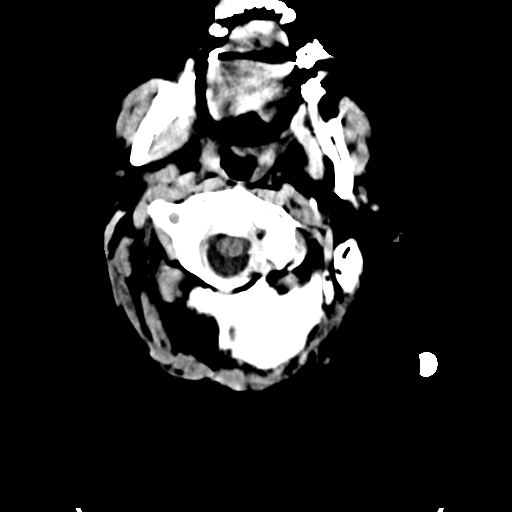
[im 5/39  bone]
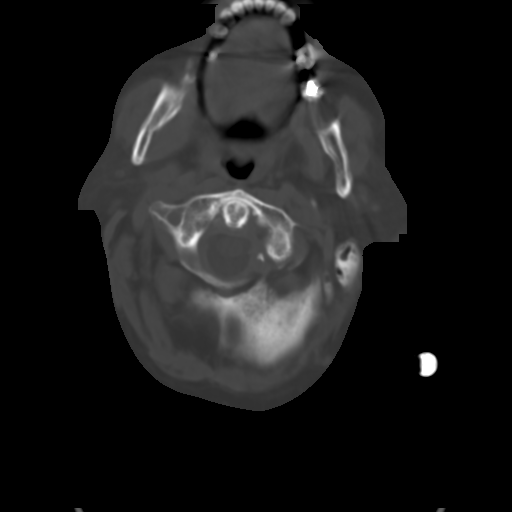
[im 10/39  brain]
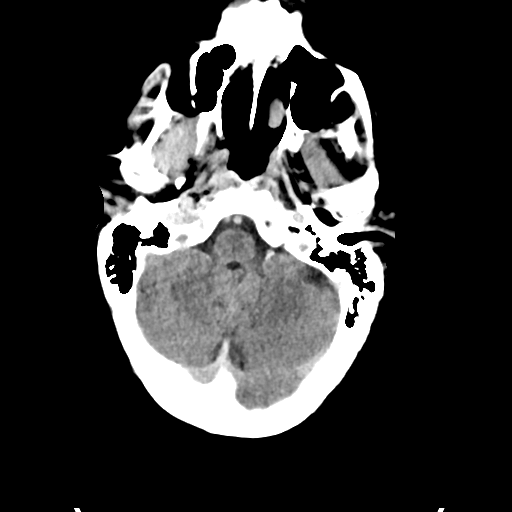
[im 15/39  brain]
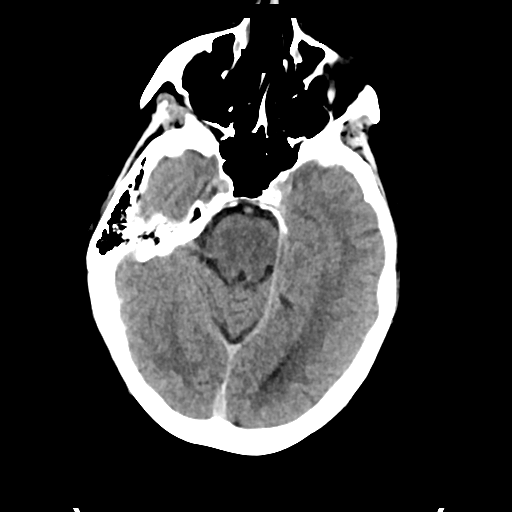
[im 20/39  brain]
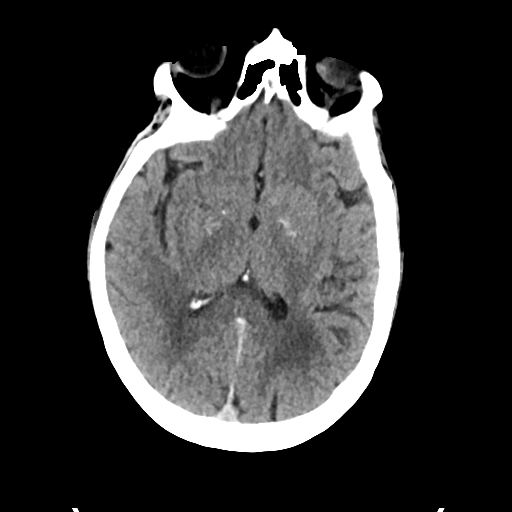
[im 24/39  brain]
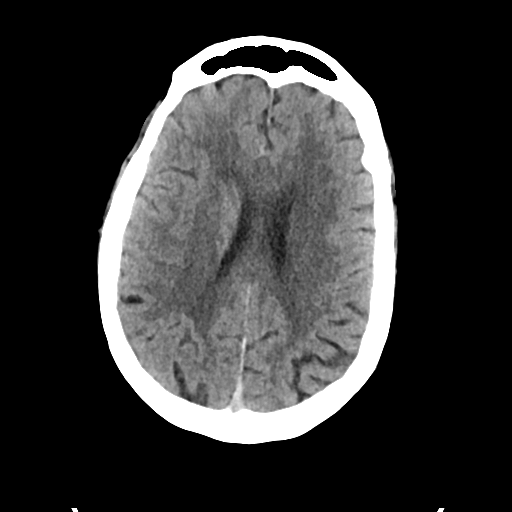
[im 24/39  bone]
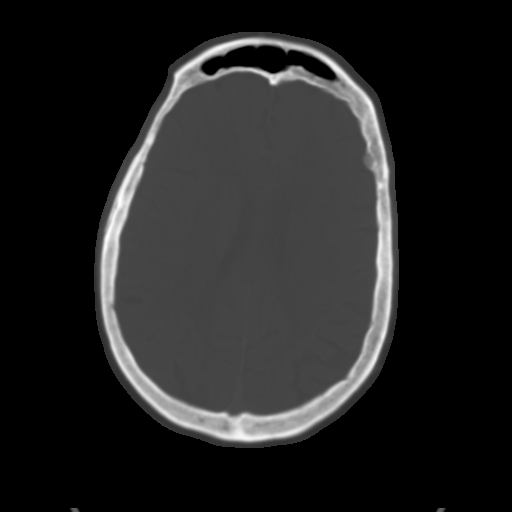
[im 29/39  brain]
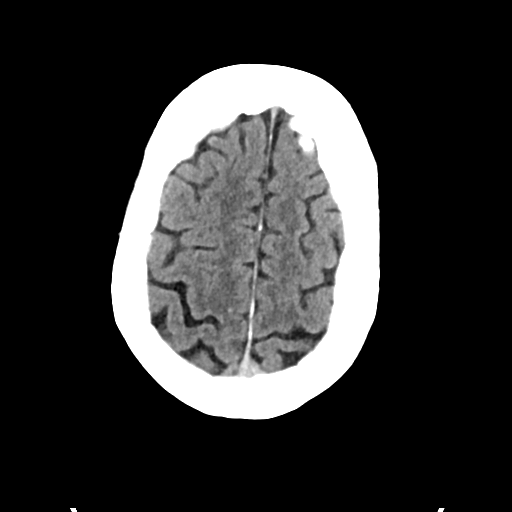
[im 34/39  brain]
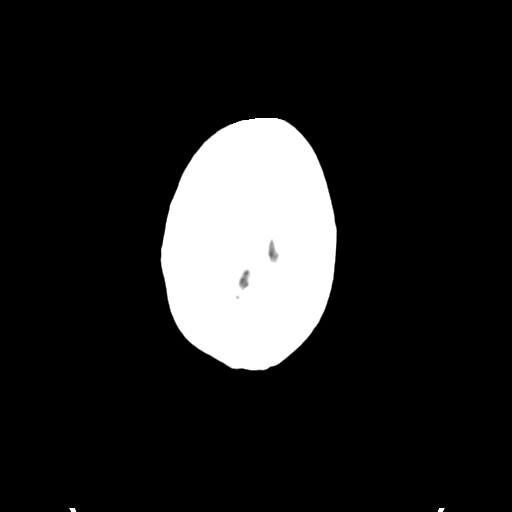

[Series 4: head bone · axial · 0.45mm/px · z∈[-150,-112]mm · 3 of 96 slices shown]
[im 10/96  bone]
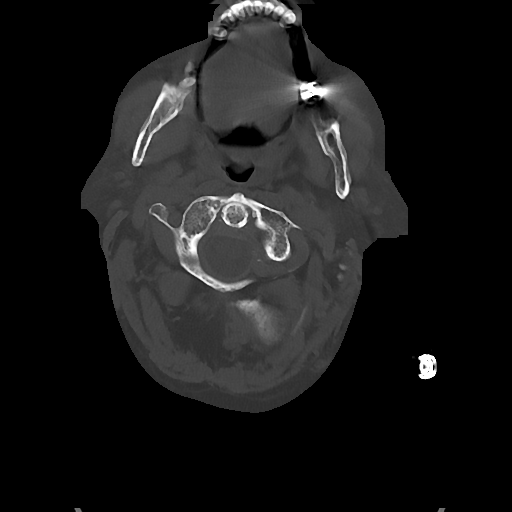
[im 20/96  bone]
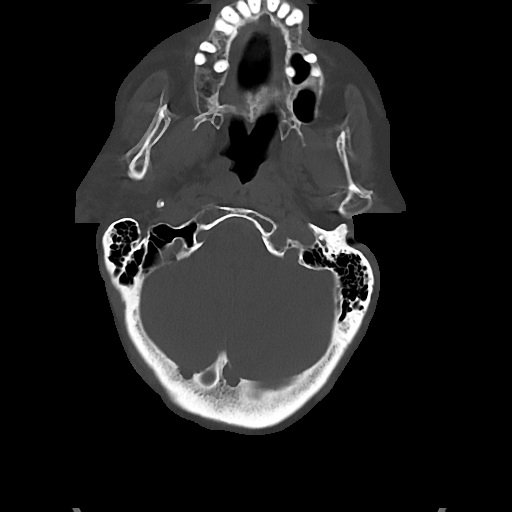
[im 29/96  bone]
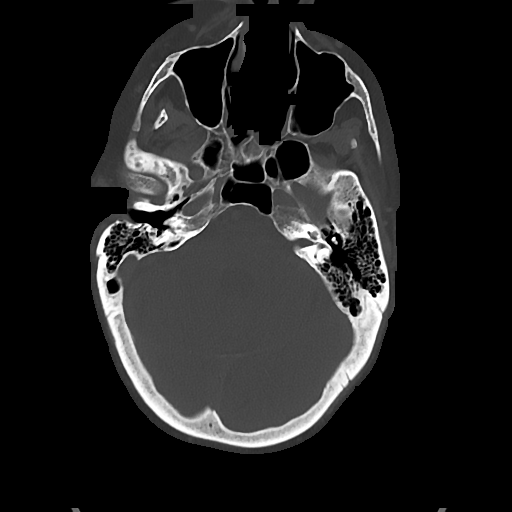

[Series 5: cor soft · coronal · 0.37mm/px · 3 of 71 slices shown]
[im 24/71  brain]
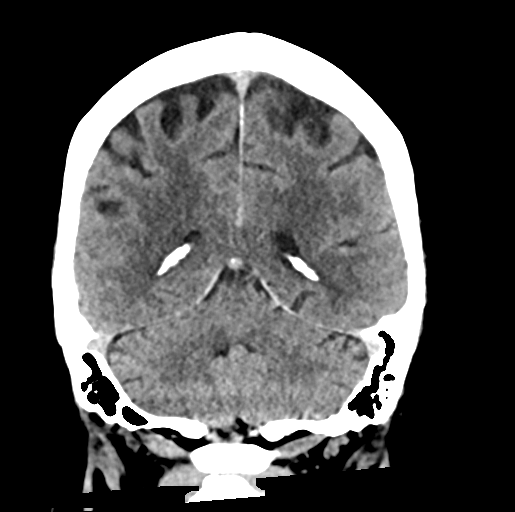
[im 32/71  brain]
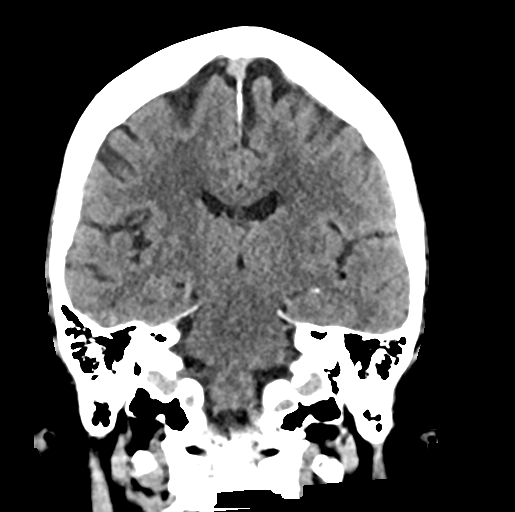
[im 39/71  brain]
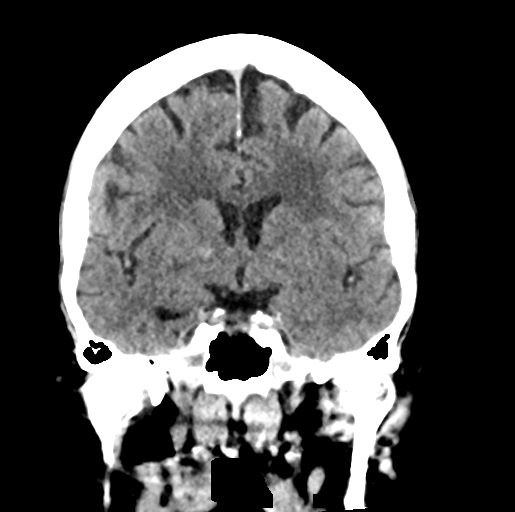

[Series 6: sag soft · sagittal · 0.37mm/px · 3 of 58 slices shown]
[im 20/58  brain]
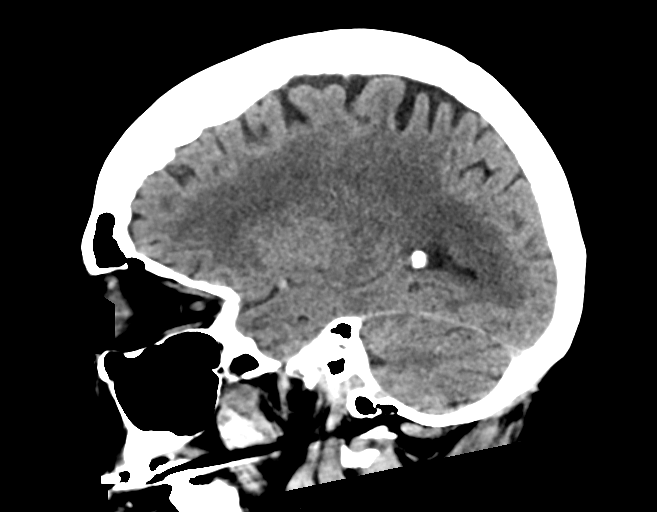
[im 29/58  brain]
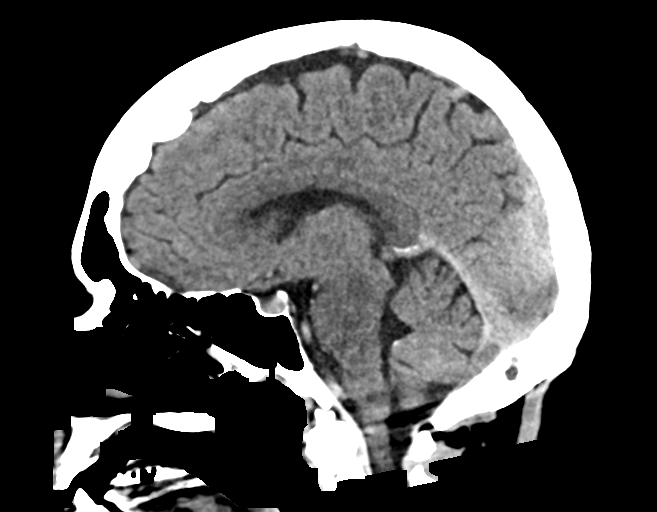
[im 39/58  brain]
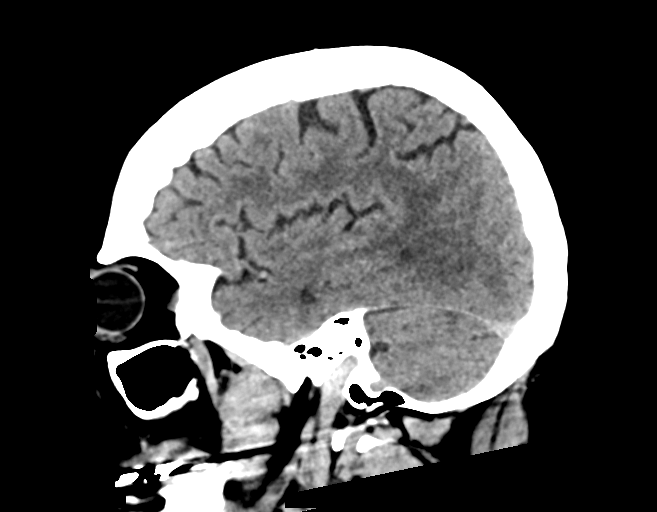

[16 of 47 positions shown; findings below may reference images not displayed]

FINDINGS: Brain: There is no evidence of acute infarct, intracranial
hemorrhage, mass, midline shift, or extra-axial fluid collection.
The ventricles and sulci are normal. Patchy to confluent cerebral
white matter hypodensities are unchanged and nonspecific but
compatible with severe chronic small vessel ischemic disease.

Vascular: Calcified atherosclerosis at the skull base. Residual
vascular contrast from coronary catheterization earlier today

Skull: No fracture or focal osseous lesion.

Sinuses/Orbits: Paranasal sinuses and mastoid air cells are clear.
Bilateral cataract extraction.

Other: None.

ASPECTS ([place]berta Stroke Program Early CT Score)

- Ganglionic level infarction (caudate, lentiform nuclei, internal
capsule, insula, M1-M3 cortex): 7

- Supraganglionic infarction (M4-M6 cortex): 3

Total score (0-10 with 10 being normal): 10
IMPRESSION: 1. No evidence of acute intracranial abnormality.
2. ASPECTS is 10.
3. Severe chronic small vessel ischemic disease.

These results were communicated to Dr. Dinis at [DATE] on
05/15/2018 by text page via the [employer] messaging system.

## 2019-06-25 IMAGING — DX DG CHEST 1V PORT
1 series · 1 of 1 positions shown · non-contrast
Comparison: 05/12/2018

CLINICAL DATA: Stroke protocol, pt intubated

EXAM:
PORTABLE CHEST 1 VIEW

[chest ap]
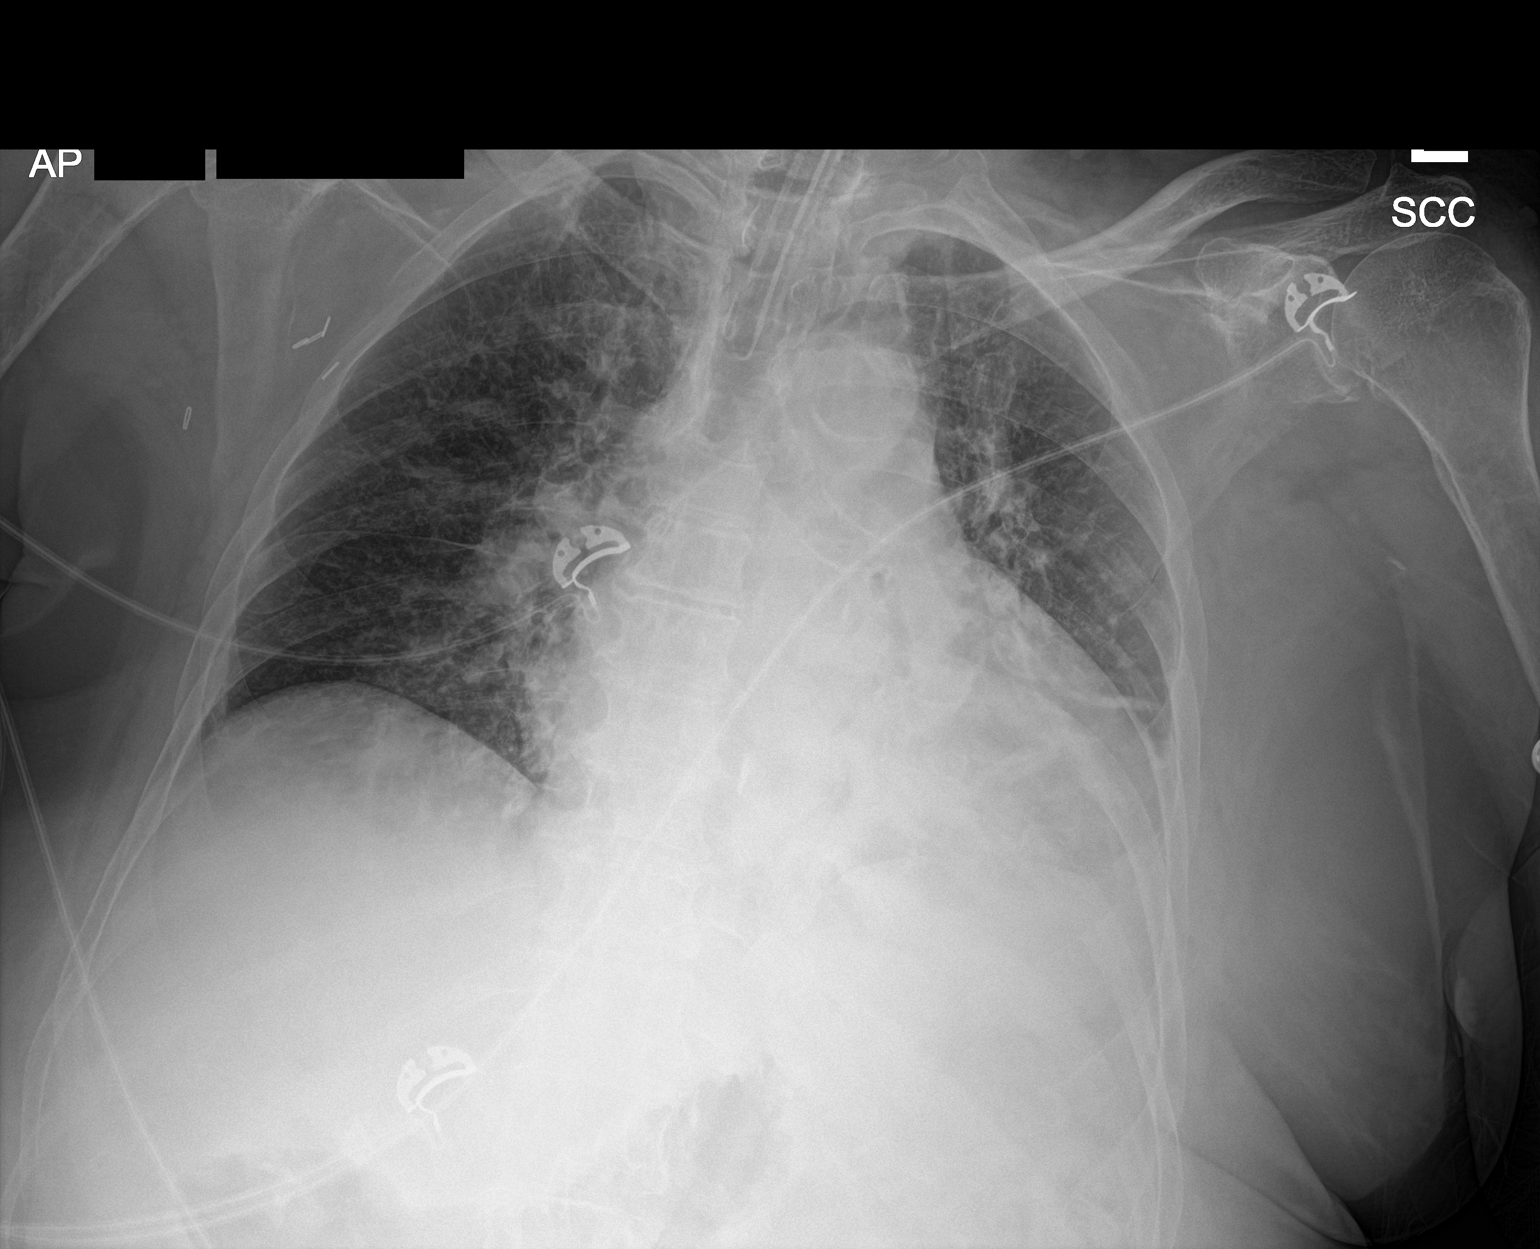

[1 of 1 positions shown; findings below may reference images not displayed]

FINDINGS: Endotracheal tube is in place, tip approximately 2.5 centimeters
above the carina. Heart is enlarged. There is patchy infiltrate in
the LEFT lung base and increased compared to prior study. Mildly
prominent interstitial markings appear stable. Surgical clips are
identified in the RIGHT axillary region.
IMPRESSION: Cardiomegaly.

LEFT LOWER lobe infiltrate.

## 2019-06-26 IMAGING — DX DG ABD PORTABLE 1V
1 series · 1 of 1 positions shown · non-contrast
Comparison: CT 01/15/2018

CLINICAL DATA: Encounter for orogastric tube placement. Hiatal
hernia based on previous abdominal CT.

EXAM:
PORTABLE ABDOMEN - 1 VIEW

[abdomen]
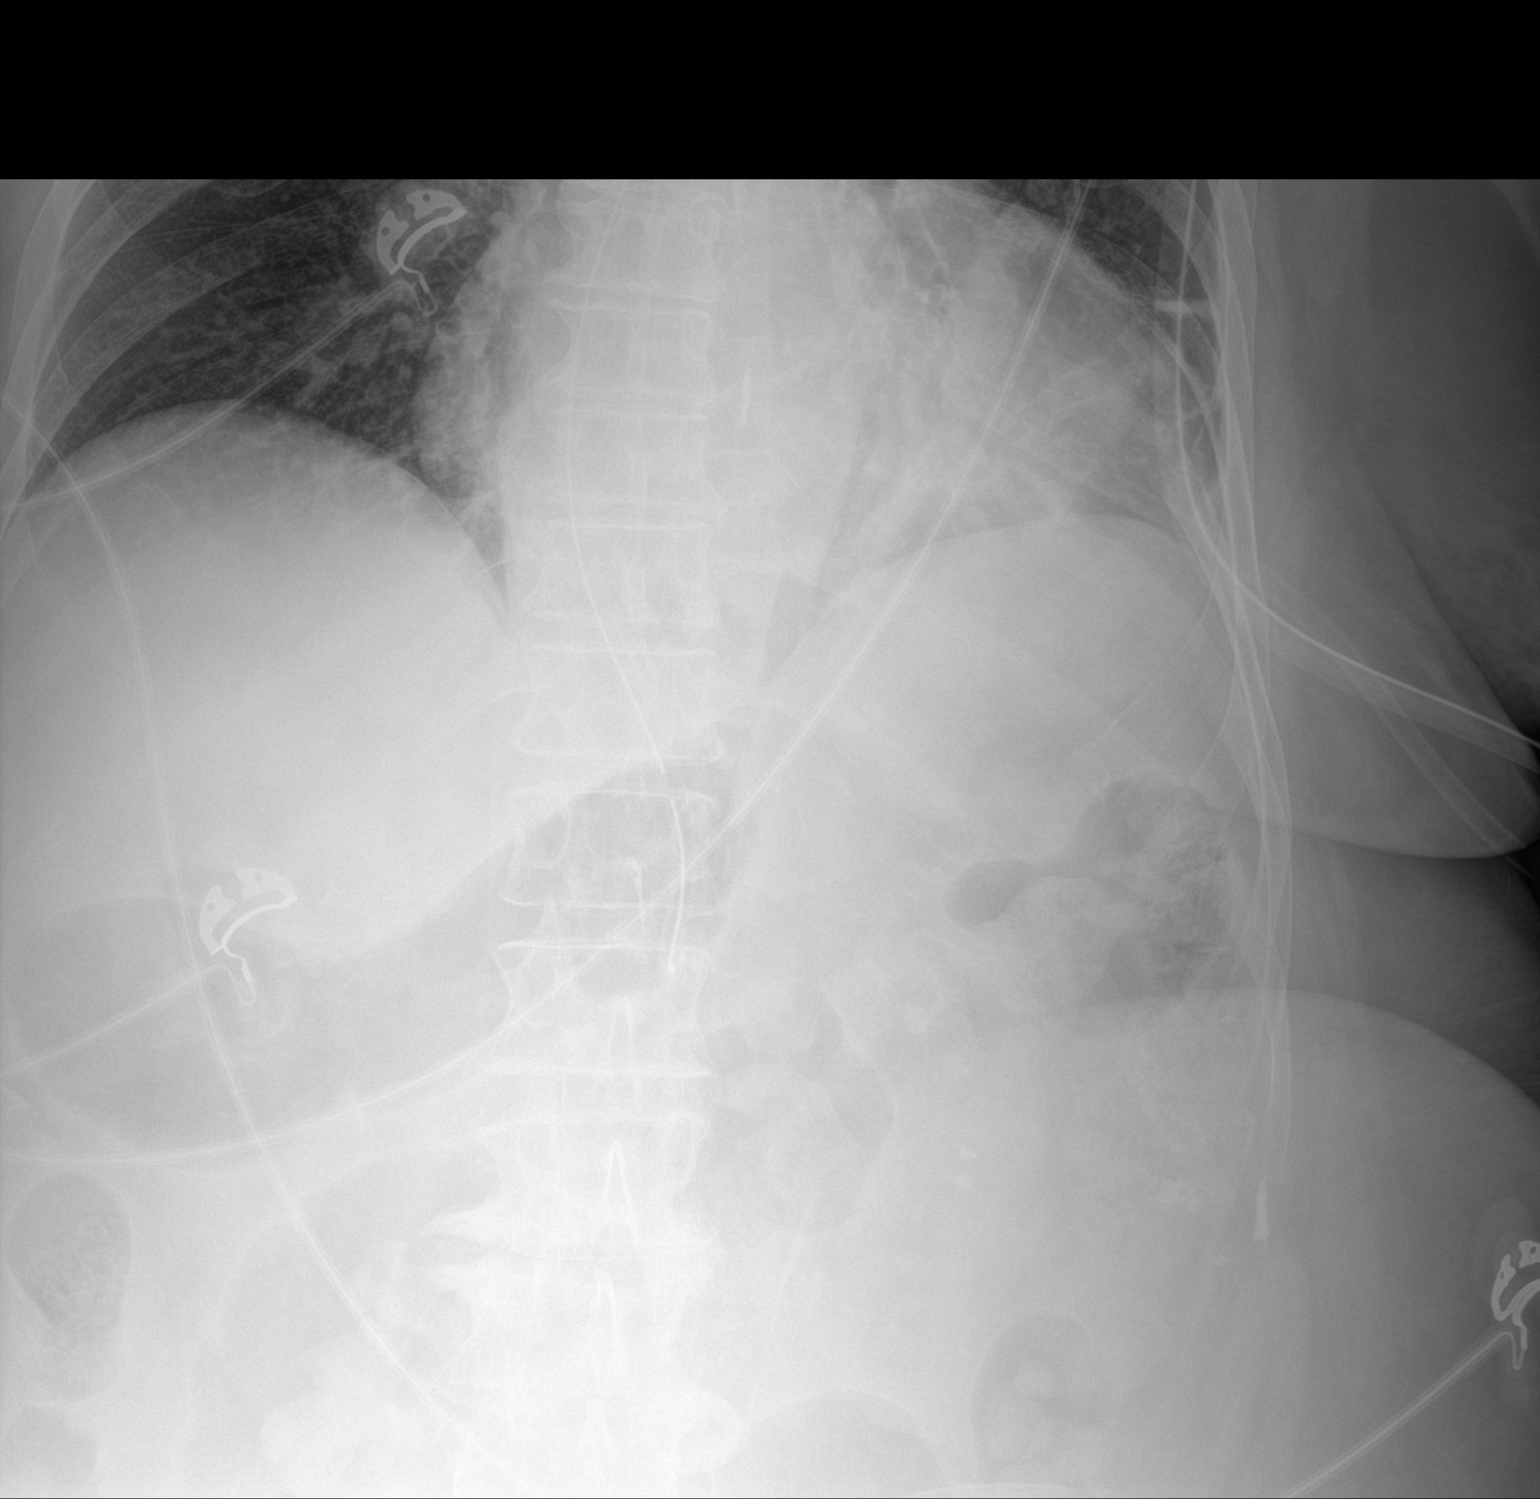

[1 of 1 positions shown; findings below may reference images not displayed]

FINDINGS: Orogastric tube has been placed. The tip in the mid upper abdomen
likely in the stomach body region. There is gas in the stomach and
gas in the visualized colon. Few patchy densities in the left lower
lobe may represent atelectasis but nonspecific.
IMPRESSION: Orogastric tube in the upper abdomen and likely in the gastric body
based on the known hiatal hernia.

## 2019-06-29 DIAGNOSIS — E119 Type 2 diabetes mellitus without complications: Secondary | ICD-10-CM | POA: Diagnosis not present

## 2019-06-29 DIAGNOSIS — D649 Anemia, unspecified: Secondary | ICD-10-CM | POA: Diagnosis not present

## 2019-06-29 DIAGNOSIS — D509 Iron deficiency anemia, unspecified: Secondary | ICD-10-CM | POA: Diagnosis not present

## 2019-07-12 DIAGNOSIS — M7502 Adhesive capsulitis of left shoulder: Secondary | ICD-10-CM | POA: Diagnosis not present

## 2019-07-12 DIAGNOSIS — M25512 Pain in left shoulder: Secondary | ICD-10-CM | POA: Diagnosis not present

## 2019-07-12 DIAGNOSIS — E119 Type 2 diabetes mellitus without complications: Secondary | ICD-10-CM | POA: Diagnosis not present

## 2019-09-22 DIAGNOSIS — E119 Type 2 diabetes mellitus without complications: Secondary | ICD-10-CM | POA: Diagnosis not present

## 2019-09-22 DIAGNOSIS — D509 Iron deficiency anemia, unspecified: Secondary | ICD-10-CM | POA: Diagnosis not present

## 2019-09-22 DIAGNOSIS — D649 Anemia, unspecified: Secondary | ICD-10-CM | POA: Diagnosis not present

## 2019-09-28 DIAGNOSIS — E039 Hypothyroidism, unspecified: Secondary | ICD-10-CM | POA: Diagnosis not present

## 2019-09-28 DIAGNOSIS — I10 Essential (primary) hypertension: Secondary | ICD-10-CM | POA: Diagnosis not present

## 2019-09-28 DIAGNOSIS — D509 Iron deficiency anemia, unspecified: Secondary | ICD-10-CM | POA: Diagnosis not present

## 2019-09-28 DIAGNOSIS — E119 Type 2 diabetes mellitus without complications: Secondary | ICD-10-CM | POA: Diagnosis not present

## 2019-09-28 DIAGNOSIS — E785 Hyperlipidemia, unspecified: Secondary | ICD-10-CM | POA: Diagnosis not present

## 2019-10-04 DIAGNOSIS — H903 Sensorineural hearing loss, bilateral: Secondary | ICD-10-CM | POA: Diagnosis not present

## 2019-10-04 DIAGNOSIS — R42 Dizziness and giddiness: Secondary | ICD-10-CM | POA: Diagnosis not present

## 2019-10-04 DIAGNOSIS — H6123 Impacted cerumen, bilateral: Secondary | ICD-10-CM | POA: Diagnosis not present

## 2019-10-20 DIAGNOSIS — R42 Dizziness and giddiness: Secondary | ICD-10-CM | POA: Diagnosis not present

## 2019-10-24 ENCOUNTER — Other Ambulatory Visit (HOSPITAL_COMMUNITY): Payer: Self-pay | Admitting: Physician Assistant

## 2019-11-01 DIAGNOSIS — H903 Sensorineural hearing loss, bilateral: Secondary | ICD-10-CM | POA: Diagnosis not present

## 2019-11-01 DIAGNOSIS — R42 Dizziness and giddiness: Secondary | ICD-10-CM | POA: Diagnosis not present

## 2019-11-02 ENCOUNTER — Telehealth: Payer: Self-pay | Admitting: Hematology & Oncology

## 2019-11-02 NOTE — Telephone Encounter (Signed)
lmom to inform of new patient appointment 7/8 at 130 pm. appointment calender mailed

## 2019-11-17 ENCOUNTER — Other Ambulatory Visit (HOSPITAL_COMMUNITY): Payer: Self-pay | Admitting: Physician Assistant

## 2019-11-25 ENCOUNTER — Inpatient Hospital Stay: Payer: Medicare Other

## 2019-11-25 ENCOUNTER — Inpatient Hospital Stay: Payer: Medicare Other | Attending: Hematology & Oncology | Admitting: Hematology & Oncology

## 2019-11-25 ENCOUNTER — Other Ambulatory Visit: Payer: Self-pay

## 2019-11-25 ENCOUNTER — Encounter: Payer: Self-pay | Admitting: Hematology & Oncology

## 2019-11-25 VITALS — BP 156/74 | HR 74 | Temp 98.1°F | Resp 20 | Ht 67.0 in | Wt 242.0 lb

## 2019-11-25 DIAGNOSIS — N289 Disorder of kidney and ureter, unspecified: Secondary | ICD-10-CM | POA: Diagnosis not present

## 2019-11-25 DIAGNOSIS — E119 Type 2 diabetes mellitus without complications: Secondary | ICD-10-CM

## 2019-11-25 DIAGNOSIS — E039 Hypothyroidism, unspecified: Secondary | ICD-10-CM | POA: Diagnosis not present

## 2019-11-25 DIAGNOSIS — I10 Essential (primary) hypertension: Secondary | ICD-10-CM | POA: Diagnosis not present

## 2019-11-25 DIAGNOSIS — D500 Iron deficiency anemia secondary to blood loss (chronic): Secondary | ICD-10-CM

## 2019-11-25 DIAGNOSIS — D649 Anemia, unspecified: Secondary | ICD-10-CM | POA: Diagnosis not present

## 2019-11-25 DIAGNOSIS — D631 Anemia in chronic kidney disease: Secondary | ICD-10-CM

## 2019-11-25 LAB — CMP (CANCER CENTER ONLY)
ALT: 11 U/L (ref 0–44)
AST: 18 U/L (ref 15–41)
Albumin: 4.3 g/dL (ref 3.5–5.0)
Alkaline Phosphatase: 56 U/L (ref 38–126)
Anion gap: 8 (ref 5–15)
BUN: 17 mg/dL (ref 8–23)
CO2: 26 mmol/L (ref 22–32)
Calcium: 10.1 mg/dL (ref 8.9–10.3)
Chloride: 103 mmol/L (ref 98–111)
Creatinine: 1.22 mg/dL — ABNORMAL HIGH (ref 0.44–1.00)
GFR, Est AFR Am: 49 mL/min — ABNORMAL LOW (ref >60)
GFR, Estimated: 42 mL/min — ABNORMAL LOW (ref >60)
Glucose, Bld: 99 mg/dL (ref 70–99)
Potassium: 4.6 mmol/L (ref 3.5–5.1)
Sodium: 137 mmol/L (ref 135–145)
Total Bilirubin: 0.5 mg/dL (ref 0.3–1.2)
Total Protein: 7.3 g/dL (ref 6.5–8.1)

## 2019-11-25 LAB — CBC WITH DIFFERENTIAL (CANCER CENTER ONLY)
Abs Immature Granulocytes: 0.08 10*3/uL — ABNORMAL HIGH (ref 0.00–0.07)
Basophils Absolute: 0.1 10*3/uL (ref 0.0–0.1)
Basophils Relative: 2 %
Eosinophils Absolute: 0.4 10*3/uL (ref 0.0–0.5)
Eosinophils Relative: 6 %
HCT: 33.7 % — ABNORMAL LOW (ref 36.0–46.0)
Hemoglobin: 10.5 g/dL — ABNORMAL LOW (ref 12.0–15.0)
Immature Granulocytes: 1 %
Lymphocytes Relative: 26 %
Lymphs Abs: 1.5 10*3/uL (ref 0.7–4.0)
MCH: 28.1 pg (ref 26.0–34.0)
MCHC: 31.2 g/dL (ref 30.0–36.0)
MCV: 90.1 fL (ref 80.0–100.0)
Monocytes Absolute: 0.5 10*3/uL (ref 0.1–1.0)
Monocytes Relative: 8 %
Neutro Abs: 3.3 10*3/uL (ref 1.7–7.7)
Neutrophils Relative %: 57 %
Platelet Count: 311 10*3/uL (ref 150–400)
RBC: 3.74 MIL/uL — ABNORMAL LOW (ref 3.87–5.11)
RDW: 17.8 % — ABNORMAL HIGH (ref 11.5–15.5)
WBC Count: 5.9 10*3/uL (ref 4.0–10.5)
nRBC: 0 % (ref 0.0–0.2)

## 2019-11-25 LAB — RETICULOCYTES
Immature Retic Fract: 21.3 % — ABNORMAL HIGH (ref 2.3–15.9)
RBC.: 3.77 MIL/uL — ABNORMAL LOW (ref 3.87–5.11)
Retic Count, Absolute: 66 10*3/uL (ref 19.0–186.0)
Retic Ct Pct: 1.8 % (ref 0.4–3.1)

## 2019-11-26 ENCOUNTER — Telehealth: Payer: Self-pay | Admitting: Hematology & Oncology

## 2019-11-26 LAB — IRON AND TIBC
Iron: 245 ug/dL — ABNORMAL HIGH (ref 41–142)
Saturation Ratios: 62 % — ABNORMAL HIGH (ref 21–57)
TIBC: 394 ug/dL (ref 236–444)
UIBC: 149 ug/dL (ref 120–384)

## 2019-11-26 LAB — FERRITIN: Ferritin: 20 ng/mL (ref 11–307)

## 2019-11-26 NOTE — Telephone Encounter (Signed)
No los 7/8 

## 2019-11-27 NOTE — Progress Notes (Signed)
Referral MD  Reason for Referral: Multifactorial Anemia  Chief Complaint  Patient presents with  . Initial Visit.    I have no iron in my blood.  : I have low blood.  HPI: Gloria Joseph is a very charming 79 year old white female.  She has multiple medical problems.  She is followed by Dr. Jani Gravel of [facility].  She has had known iron deficiency in the past.  I think she has gotten iron infusions.  There is a note saying iron infusion.  However, I think she has not had any iron infusions.  He has been following her blood carefully.  Back in May a ferritin was only 14.  She has had an evaluation.  So far, there is no obvious evidence of bleeding.  She has reflux.  She is on oral iron.  She does have diabetes.  She is on quite a few medications.  She is also taking Voltaren as a gel..  She had been on Eliquis but this has been stopped.  In the past, she has had a cerebrovascular event.  She has had some rectal bleeding in the past.  There has been no weight loss.  She does not smoke.  She does not drink.  She has had past surgery.  There is no history in the family of blood issues.  She has had no rashes.  There is been occasional leg swelling.  She is not a vegetarian.  Again she is very nice.  She is incredibly humorous.  Overall her performance status is ECOG 1.    Past Medical History:  Diagnosis Date  . Anemia 12/18/2011  . Atrial fibrillation ([place]) 04/13/2011  . Cancer [facility])    breast CA s/p Right mastectomy  . Diabetes mellitus, type 2 (HCC)    diet controlled  . Hx of echocardiogram    Echocardiogram (12/15): Moderate LVH, EF 55-60%, normal wall motion, grade 1 diastolic dysfunction, aortic sclerosis without stenosis, mild AI, MAC  . Hyperlipidemia   . Hypertension    medicine refractory  . Hypothyroidism    s/p thryroid gland ablation  . Neuromuscular disorder ([place])   . NSTEMI (non-ST elevated myocardial infarction) ([place]) 05/13/2018   . Osteopenia   . Renal insufficiency, mild 09/23/2013  . Stroke [facility]) 05/15/2018  :  Past Surgical History:  Procedure Laterality Date  . BREAST ENHANCEMENT SURGERY    . BREAST IMPLANT REMOVAL  2000   Unilaterally   . BREAST SURGERY    . CHOLECYSTECTOMY    . COLONOSCOPY  2002   Neg  . CORONARY ANGIOGRAPHY N/A 05/15/2018   Procedure: CORONARY ANGIOGRAPHY;  Surgeon: Burnell Blanks, MD;  Location: [place] CV LAB;  Service: Cardiovascular;  Laterality: N/A;  . IR ANGIO INTRA EXTRACRAN SEL COM CAROTID INNOMINATE BILAT MOD SED  05/15/2018  . IR ANGIO VERTEBRAL SEL SUBCLAVIAN INNOMINATE UNI R MOD SED  05/15/2018  . MASTECTOMY    . RADIOLOGY WITH ANESTHESIA N/A 05/15/2018   Procedure: IR WITH ANESTHESIA;  Surgeon: Luanne Bras, MD;  Location: [place];  Service: Radiology;  Laterality: N/A;  . RAI  04/04/2006  . TUBAL LIGATION    :   Current Outpatient Medications:  .  CALCIUM-MAGNESIUM-ZINC PO, Take 1 tablet by mouth daily., Disp: , Rfl:  .  carvedilol (COREG) 3.125 MG tablet, Take 1 tablet by mouth 2 (two) times daily., Disp: , Rfl:  .  Cholecalciferol (VITAMIN D-3) 1000 UNITS CAPS, Take 1 capsule by mouth daily., Disp: ,  Rfl:  .  citalopram (CELEXA) 20 MG tablet, Take 20 mg by mouth daily., Disp: , Rfl:  .  Coenzyme Q10 (CO Q 10 PO), Take 1 capsule by mouth daily., Disp: , Rfl:  .  ferrous sulfate 325 (65 FE) MG EC tablet, Take 325 mg by mouth daily., Disp: , Rfl:  .  Flaxseed, Linseed, (FLAXSEED OIL PO), Take 1 tablet by mouth daily., Disp: , Rfl:  .  gabapentin (NEURONTIN) 300 MG capsule, at bedtime. , Disp: , Rfl:  .  levothyroxine (SYNTHROID, LEVOTHROID) 112 MCG tablet, Take 1 tablet (112 mcg) by mouth daily. EXCEPT Tuesdays and Thursdays take 1/2 tablet (56 mcg) by mouth daily., Disp: , Rfl:  .  losartan (COZAAR) 100 MG tablet, Take 100 mg by mouth daily., Disp: , Rfl:  .  metFORMIN (GLUCOPHAGE) 1000 MG tablet, Take 500 mg by mouth 2 (two) times daily., Disp: ,  Rfl:  .  Multiple Vitamins-Minerals (MULTIVITAMINS) CHEW, Chew 1 tablet by mouth daily., Disp: , Rfl:  .  Omega-3 Fatty Acids (FISH OIL PO), Take 1 capsule by mouth daily., Disp: , Rfl:  .  pravastatin (PRAVACHOL) 80 MG tablet, Take 80 mg by mouth daily. , Disp: , Rfl:  .  rivaroxaban (XARELTO) 20 MG TABS tablet, Take 1 tablet (20 mg total) by mouth daily with supper. appt required for refills [phone], Disp: 30 tablet, Rfl: 0 .  vitamin B-12 (CYANOCOBALAMIN) 1000 MCG tablet, Take 1,000 mcg by mouth daily., Disp: , Rfl:  .  vitamin C (ASCORBIC ACID) 250 MG tablet, Take 250 mg by mouth daily., Disp: , Rfl:  .  nitroGLYCERIN (NITROSTAT) 0.4 MG SL tablet, Place 1 tablet (0.4 mg total) under the tongue every 5 (five) minutes as needed for chest pain. (Patient not taking: Reported on 11/25/2019), Disp: 30 tablet, Rfl: 2:  :  Allergies  Allergen Reactions  . Lisinopril     ? Angioedema (NOT ARB candidate) Because of a history of documented adverse serious drug reaction;Medi Alert bracelet  is recommended  . Hctz [Hydrochlorothiazide]     hyponatremia  . Spironolactone     REACTION: low potassium ???  . Apixaban   . Penicillins Other (See Comments)    Pt states medication doesn't work for her d/t her usage of it several years ago.  . Amlodipine Besylate     REACTION: edema  :  Family History  Problem Relation Age of Onset  . Depression Mother   . Alcohol abuse Father   . Heart attack Brother 89  . Cancer Other        Uncle, not specific  . Cancer Other        Aunt, not specific. GYN CA  . Breast cancer Other        Aunt, not specific  . Diabetes Other        Grandmother, not specific  . Cancer Maternal Grandfather        colon  :  Social History   Socioeconomic History  . Marital status: Divorced    Spouse name: Not on file  . Number of children: Not on file  . Years of education: Not on file  . Highest education level: Not on file  Occupational History  . Not on file   Tobacco Use  . Smoking status: Never Smoker  . Smokeless tobacco: Never Used  Vaping Use  . Vaping Use: Never used  Substance and Sexual Activity  . Alcohol use: No  . Drug use: No  .  Sexual activity: Never  Other Topics Concern  . Not on file  Social History Narrative  . Not on file   Social Determinants of Health   Financial Resource Strain:   . Difficulty of Paying Living Expenses:   Food Insecurity:   . Worried About Charity fundraiser in the Last Year:   . Arboriculturist in the Last Year:   Transportation Needs:   . Film/video editor (Medical):   Marland Kitchen Lack of Transportation (Non-Medical):   Physical Activity:   . Days of Exercise per Week:   . Minutes of Exercise per Session:   Stress:   . Feeling of Stress :   Social Connections:   . Frequency of Communication with Friends and Family:   . Frequency of Social Gatherings with Friends and Family:   . Attends Religious Services:   . Active Member of Clubs or Organizations:   . Attends Archivist Meetings:   Marland Kitchen Marital Status:   Intimate Partner Violence:   . Fear of Current or Ex-Partner:   . Emotionally Abused:   Marland Kitchen Physically Abused:   . Sexually Abused:   :  Review of Systems  Constitutional: Negative.   HENT: Negative.   Eyes: Negative.   Respiratory: Negative.   Cardiovascular: Negative.   Gastrointestinal: Negative.   Genitourinary: Negative.   Musculoskeletal: Negative.   Skin: Negative.   Neurological: Negative.   Endo/Heme/Allergies: Negative.   Psychiatric/Behavioral: Negative.      Exam:  This is a moderately obese white female in no obvious distress.  Vital signs are temperature of 98.1.  Pulse 74.  Blood pressure 156/74.  Her weight is 242 pounds.  Head and neck exam shows no ocular or oral lesions.  She has some slight pallor in the conjunctivae.  There is no scleral icterus.  She has no adenopathy in the neck or supraclavicular regions.  Lungs are clear bilaterally.  Cardiac  exam regular rate and rhythm with occasional extra beat.  She has no murmurs, rubs or bruits.  Abdomen is soft.  She is obese.  She has good bowel sounds.  There is no fluid wave.  There is no palpable liver or spleen tip.  Back exam shows no tenderness over the spine, ribs or hips.  Extremities shows no clubbing, cyanosis or edema.  Neurological exam shows no focal neurological deficits.  Skin exam shows no rashes, ecchymoses or petechia.   _0 @   Recent Labs    11/25/19 1341  WBC 5.9  HGB 10.5*  HCT 33.7*  PLT 311   Recent Labs    11/25/19 1341  NA 137  K 4.6  CL 103  CO2 26  GLUCOSE 99  BUN 17  CREATININE 1.22*  CALCIUM 10.1    Blood smear review: Mild anisocytosis and poikilocytosis.  There is some slight hypochromic red blood cells.  I see no nucleated red blood cells.  There is no teardrop cells.  I see no target cells.  Are no schistocytes.  She has no rouleaux formation.  White blood cells appear normal in morphology maturation.  She has no hypersegmented polys.  There is no immature myeloid or lymphoid forms.  Platelets are adequate number and size.  Platelets are well granulated.  Pathology: None    Assessment and Plan:   Gloria Joseph is a very charming 79 year old white female.  She has multiple medical problems.  She is mildly anemic.  I suspect anemia is multifactorial.  We did do  iron studies on her.  Her ferritin is 20 with an iron saturation of 62%.  She does have a low corrected reticulocyte count.  She does have mild renal insufficiency.  Thankfully, her anemia is not all that bad.  I suspect that she probably will have an abnormally low erythropoietin level.  We will have to wait result of that study.  I do not see any hematologic malignancy.  However, given her age, it certainly is possible that she may have an underlying myelodysplastic process.  I just do not see a need for a bone marrow biopsy right now.  I do not see a need for any radiographic  studies.  We will try to keep this simple.  She does live a little bit away down in [place].  We do not want had to have her travel all that much.  We will see what the erythropoietin level is.  Based on the lab work, we will then plan for follow-up.  I spent about 45 minutes or so with Ms. Widmer.  Again she is very charming.  It was a lot of fun talking to her.

## 2019-11-29 LAB — ERYTHROPOIETIN: Erythropoietin: 24 m[IU]/mL — ABNORMAL HIGH (ref 2.6–18.5)

## 2019-12-06 ENCOUNTER — Encounter: Payer: Self-pay | Admitting: Hematology & Oncology

## 2019-12-06 ENCOUNTER — Telehealth: Payer: Self-pay | Admitting: *Deleted

## 2019-12-06 DIAGNOSIS — D631 Anemia in chronic kidney disease: Secondary | ICD-10-CM | POA: Insufficient documentation

## 2019-12-06 HISTORY — DX: Anemia in chronic kidney disease: D63.1

## 2019-12-06 NOTE — Telephone Encounter (Signed)
Call received from patient wanting to know when her next appt is.  Per Dr. Marin Olp, pt to come back next week for labs and to see Dr Marin Olp.  Message sent to scheduling.

## 2019-12-07 ENCOUNTER — Other Ambulatory Visit: Payer: Self-pay | Admitting: Family

## 2019-12-08 ENCOUNTER — Other Ambulatory Visit: Payer: Self-pay | Admitting: Family

## 2019-12-10 ENCOUNTER — Telehealth (HOSPITAL_COMMUNITY): Payer: Self-pay

## 2019-12-10 ENCOUNTER — Other Ambulatory Visit: Payer: Self-pay | Admitting: *Deleted

## 2019-12-10 ENCOUNTER — Other Ambulatory Visit (HOSPITAL_COMMUNITY): Payer: Self-pay | Admitting: Physician Assistant

## 2019-12-10 DIAGNOSIS — D500 Iron deficiency anemia secondary to blood loss (chronic): Secondary | ICD-10-CM

## 2019-12-10 DIAGNOSIS — D631 Anemia in chronic kidney disease: Secondary | ICD-10-CM

## 2019-12-10 NOTE — Telephone Encounter (Signed)
Reached out to patient to schedule an appointment needing a refill of Xarelto.

## 2019-12-13 ENCOUNTER — Inpatient Hospital Stay: Payer: Medicare Other

## 2019-12-13 ENCOUNTER — Other Ambulatory Visit: Payer: Self-pay

## 2019-12-13 ENCOUNTER — Telehealth: Payer: Self-pay | Admitting: Hematology & Oncology

## 2019-12-13 ENCOUNTER — Encounter: Payer: Self-pay | Admitting: Hematology & Oncology

## 2019-12-13 ENCOUNTER — Other Ambulatory Visit: Payer: Self-pay | Admitting: Family

## 2019-12-13 ENCOUNTER — Inpatient Hospital Stay (HOSPITAL_BASED_OUTPATIENT_CLINIC_OR_DEPARTMENT_OTHER): Payer: Medicare Other | Admitting: Hematology & Oncology

## 2019-12-13 VITALS — BP 160/48 | HR 63 | Temp 99.1°F | Resp 18 | Wt 243.0 lb

## 2019-12-13 DIAGNOSIS — I10 Essential (primary) hypertension: Secondary | ICD-10-CM | POA: Diagnosis not present

## 2019-12-13 DIAGNOSIS — D649 Anemia, unspecified: Secondary | ICD-10-CM | POA: Diagnosis not present

## 2019-12-13 DIAGNOSIS — N289 Disorder of kidney and ureter, unspecified: Secondary | ICD-10-CM | POA: Diagnosis not present

## 2019-12-13 DIAGNOSIS — D500 Iron deficiency anemia secondary to blood loss (chronic): Secondary | ICD-10-CM

## 2019-12-13 DIAGNOSIS — D631 Anemia in chronic kidney disease: Secondary | ICD-10-CM

## 2019-12-13 DIAGNOSIS — E039 Hypothyroidism, unspecified: Secondary | ICD-10-CM | POA: Diagnosis not present

## 2019-12-13 DIAGNOSIS — E119 Type 2 diabetes mellitus without complications: Secondary | ICD-10-CM | POA: Diagnosis not present

## 2019-12-13 LAB — CBC WITH DIFFERENTIAL (CANCER CENTER ONLY)
Abs Immature Granulocytes: 0.01 10*3/uL (ref 0.00–0.07)
Basophils Absolute: 0.1 10*3/uL (ref 0.0–0.1)
Basophils Relative: 2 %
Eosinophils Absolute: 0.5 10*3/uL (ref 0.0–0.5)
Eosinophils Relative: 10 %
HCT: 33.2 % — ABNORMAL LOW (ref 36.0–46.0)
Hemoglobin: 10.4 g/dL — ABNORMAL LOW (ref 12.0–15.0)
Immature Granulocytes: 0 %
Lymphocytes Relative: 25 %
Lymphs Abs: 1.3 10*3/uL (ref 0.7–4.0)
MCH: 28.4 pg (ref 26.0–34.0)
MCHC: 31.3 g/dL (ref 30.0–36.0)
MCV: 90.7 fL (ref 80.0–100.0)
Monocytes Absolute: 0.4 10*3/uL (ref 0.1–1.0)
Monocytes Relative: 8 %
Neutro Abs: 2.9 10*3/uL (ref 1.7–7.7)
Neutrophils Relative %: 55 %
Platelet Count: 268 10*3/uL (ref 150–400)
RBC: 3.66 MIL/uL — ABNORMAL LOW (ref 3.87–5.11)
RDW: 17.2 % — ABNORMAL HIGH (ref 11.5–15.5)
WBC Count: 5.1 10*3/uL (ref 4.0–10.5)
nRBC: 0 % (ref 0.0–0.2)

## 2019-12-13 LAB — CMP (CANCER CENTER ONLY)
ALT: 10 U/L (ref 0–44)
AST: 14 U/L — ABNORMAL LOW (ref 15–41)
Albumin: 4.1 g/dL (ref 3.5–5.0)
Alkaline Phosphatase: 53 U/L (ref 38–126)
Anion gap: 8 (ref 5–15)
BUN: 21 mg/dL (ref 8–23)
CO2: 26 mmol/L (ref 22–32)
Calcium: 10.1 mg/dL (ref 8.9–10.3)
Chloride: 108 mmol/L (ref 98–111)
Creatinine: 1.28 mg/dL — ABNORMAL HIGH (ref 0.44–1.00)
GFR, Est AFR Am: 46 mL/min — ABNORMAL LOW (ref >60)
GFR, Estimated: 40 mL/min — ABNORMAL LOW (ref >60)
Glucose, Bld: 111 mg/dL — ABNORMAL HIGH (ref 70–99)
Potassium: 4.4 mmol/L (ref 3.5–5.1)
Sodium: 142 mmol/L (ref 135–145)
Total Bilirubin: 0.4 mg/dL (ref 0.3–1.2)
Total Protein: 7.1 g/dL (ref 6.5–8.1)

## 2019-12-13 NOTE — Progress Notes (Signed)
Hematology and Oncology Follow Up Visit  Gloria Joseph [id number] 1940/08/25 79 y.o. 12/13/2019   Principle Diagnosis:   Erythropoietin deficient anemia  Transient iron deficiency anemia  Non-insulin-dependent diabetes with chronic renal insufficiency  Current Therapy:    Aranesp 300 mcg subcu every 3 weeks for hemoglobin less than 11  IV iron as indicated     Interim History:  Gloria Joseph is back for follow-up.  I think the problem with her anemia that she has an inappropriately low erythropoietin level.  We checked her erythropoietin level while we saw her.  It was only 24.  Although this was quite low given her hemoglobin of 10.  Her iron studies looked okay.  Her ferritin was 20 but her iron saturation was 62%.  She is on oral iron.  She does have chronic renal insufficiency.  She has diabetes.  I think that Aranesp would be reasonable.  She is on Xarelto.  She does have a little bit of a high blood pressure.  I told her that the blood pressure must be a little bit low before we can give her the Aranesp.  She has had no problems with nausea or vomiting.  There is been no rashes.  She had no obvious bleeding.  There is no change in bowel or bladder habits.  She has had no leg swelling.  Overall, her performance status is ECOG 1.  Medications:  Current Outpatient Medications:  .  metFORMIN (GLUCOPHAGE) 1000 MG tablet, Take 1,000 mg by mouth daily., Disp: , Rfl:  .  CALCIUM-MAGNESIUM-ZINC PO, Take 1 tablet by mouth daily., Disp: , Rfl:  .  carvedilol (COREG) 3.125 MG tablet, Take 1 tablet by mouth 2 (two) times daily., Disp: , Rfl:  .  Cholecalciferol (VITAMIN D-3) 1000 UNITS CAPS, Take 1 capsule by mouth daily., Disp: , Rfl:  .  citalopram (CELEXA) 20 MG tablet, Take 20 mg by mouth daily., Disp: , Rfl:  .  Coenzyme Q10 50 MG CAPS, Take 50 mg by mouth daily., Disp: , Rfl:  .  ferrous sulfate 325 (65 FE) MG EC tablet, Take 325 mg by mouth daily., Disp: , Rfl:  .   Flaxseed, Linseed, (FLAXSEED OIL PO), Take 1 tablet by mouth daily., Disp: , Rfl:  .  gabapentin (NEURONTIN) 300 MG capsule, at bedtime. , Disp: , Rfl:  .  glimepiride (AMARYL) 1 MG tablet, Take 0.5 mg by mouth every morning., Disp: , Rfl:  .  levothyroxine (SYNTHROID, LEVOTHROID) 112 MCG tablet, Take 1 tablet (112 mcg) by mouth daily. EXCEPT Tuesdays and Thursdays take 1/2 tablet (56 mcg) by mouth daily., Disp: , Rfl:  .  losartan (COZAAR) 100 MG tablet, Take 100 mg by mouth daily., Disp: , Rfl:  .  Multiple Vitamins-Minerals (MULTIVITAMINS) CHEW, Chew 1 tablet by mouth daily., Disp: , Rfl:  .  nitroGLYCERIN (NITROSTAT) 0.4 MG SL tablet, Place 1 tablet (0.4 mg total) under the tongue every 5 (five) minutes as needed for chest pain. (Patient not taking: Reported on 11/25/2019), Disp: 30 tablet, Rfl: 2 .  Omega-3 Fatty Acids (FISH OIL) 1000 MG CAPS, Take 1 g by mouth daily., Disp: , Rfl:  .  pravastatin (PRAVACHOL) 80 MG tablet, Take 80 mg by mouth daily. , Disp: , Rfl:  .  vitamin C (ASCORBIC ACID) 250 MG tablet, Take 250 mg by mouth daily., Disp: , Rfl:  .  XARELTO 20 MG TABS tablet, TAKE 1 TABLET (20 MG TOTAL) BY MOUTH DAILY WITH SUPPER. APPT REQUIRED  FOR REFILLS [phone], Disp: 30 tablet, Rfl: 0  Allergies:  Allergies  Allergen Reactions  . Lisinopril     ? Angioedema (NOT ARB candidate) Because of a history of documented adverse serious drug reaction;Medi Alert bracelet  is recommended  . Hctz [Hydrochlorothiazide]     hyponatremia  . Spironolactone     REACTION: low potassium ???  . Apixaban   . Penicillins Other (See Comments)    Pt states medication doesn't work for her d/t her usage of it several years ago.  . Amlodipine Besylate     REACTION: edema    Past Medical History, Surgical history, Social history, and Family History were reviewed and updated.  Review of Systems: Review of Systems  Constitutional: Positive for fatigue.  HENT:  Negative.   Eyes: Negative.     Respiratory: Positive for shortness of breath.   Cardiovascular: Negative.   Gastrointestinal: Negative.   Endocrine: Negative.   Genitourinary: Negative.    Musculoskeletal: Positive for arthralgias and myalgias.  Skin: Negative.   Neurological: Negative.   Hematological: Negative.   Psychiatric/Behavioral: Negative.     Physical Exam:  weight is 243 lb (110.2 kg) (abnormal). Her oral temperature is 99.1 F (37.3 C). Her blood pressure is 160/48 (abnormal) and her pulse is 63. Her respiration is 18 and oxygen saturation is 97%.   Wt Readings from Last 3 Encounters:  12/13/19 (!) 243 lb (110.2 kg)  11/25/19 242 lb (109.8 kg)  07/16/18 199 lb (90.3 kg)    Physical Exam Vitals reviewed.  HENT:     Head: Normocephalic and atraumatic.  Eyes:     Pupils: Pupils are equal, round, and reactive to light.  Cardiovascular:     Rate and Rhythm: Normal rate and regular rhythm.     Heart sounds: Normal heart sounds.  Pulmonary:     Effort: Pulmonary effort is normal.     Breath sounds: Normal breath sounds.  Abdominal:     General: Bowel sounds are normal.     Palpations: Abdomen is soft.  Musculoskeletal:        General: No tenderness or deformity. Normal range of motion.     Cervical back: Normal range of motion.  Lymphadenopathy:     Cervical: No cervical adenopathy.  Skin:    General: Skin is warm and dry.     Findings: No erythema or rash.  Neurological:     Mental Status: She is alert and oriented to person, place, and time.  Psychiatric:        Behavior: Behavior normal.        Thought Content: Thought content normal.        Judgment: Judgment normal.      Lab Results  Component Value Date   WBC 5.1 12/13/2019   HGB 10.4 (L) 12/13/2019   HCT 33.2 (L) 12/13/2019   MCV 90.7 12/13/2019   PLT 268 12/13/2019     Chemistry      Component Value Date/Time   NA 142 12/13/2019 1131   K 4.4 12/13/2019 1131   CL 108 12/13/2019 1131   CO2 26 12/13/2019 1131   BUN  21 12/13/2019 1131   CREATININE 1.28 (H) 12/13/2019 1131      Component Value Date/Time   CALCIUM 10.1 12/13/2019 1131   ALKPHOS 53 12/13/2019 1131   AST 14 (L) 12/13/2019 1131   ALT 10 12/13/2019 1131   BILITOT 0.4 12/13/2019 1131      Impression and Plan: Gloria Joseph is a  very charming 79 year old white female.  Again, I suspect that she has erythropoietin deficiency anemia.  I am confident that Aranesp will help.  Again we had to make sure that her blood pressure is under little bit better control.  We will see about getting the Aranesp started for next week.  I would like to try to do this every 3 weeks depending on her hemoglobin.  It is possible that she may become iron deficient.  If so, we can give her IV iron.  She is okay with my recommendations.  She wants to get her blood count better so she will feel better.  Again we will start the Aranesp next week and then we will see about getting her back the end of August.  Apparently a granddaughter is getting married at the end of August.  I would like to see her blood count a little bit better so she will enjoy the wedding and reception.  I spent about 30 minutes with her today.  I had to make arrangements for the Aranesp.  We had to get the approval.  I had to put in all the orders.   Volanda Napoleon, MD 7/26/20211:17 PM

## 2019-12-13 NOTE — Telephone Encounter (Signed)
Appointments scheduled calendar printed per  7/26 los 

## 2019-12-20 ENCOUNTER — Other Ambulatory Visit: Payer: Self-pay

## 2019-12-20 ENCOUNTER — Inpatient Hospital Stay: Payer: Medicare Other | Attending: Hematology & Oncology

## 2019-12-20 VITALS — BP 175/56 | HR 54 | Temp 98.6°F | Resp 17

## 2019-12-20 DIAGNOSIS — D631 Anemia in chronic kidney disease: Secondary | ICD-10-CM | POA: Diagnosis not present

## 2019-12-20 DIAGNOSIS — E1122 Type 2 diabetes mellitus with diabetic chronic kidney disease: Secondary | ICD-10-CM | POA: Diagnosis not present

## 2019-12-20 DIAGNOSIS — N189 Chronic kidney disease, unspecified: Secondary | ICD-10-CM | POA: Diagnosis not present

## 2019-12-20 MED ORDER — EPOETIN ALFA-EPBX 40000 UNIT/ML IJ SOLN: 20000.0000 [IU] | Freq: Once | INTRAMUSCULAR | Status: DC

## 2019-12-20 MED ORDER — EPOETIN ALFA-EPBX 10000 UNIT/ML IJ SOLN
20000.0000 [IU] | INTRAMUSCULAR | Status: DC
  Administered 2019-12-20: 20000 [IU] via SUBCUTANEOUS

## 2019-12-20 MED ORDER — EPOETIN ALFA-EPBX 10000 UNIT/ML IJ SOLN
INTRAMUSCULAR | Status: AC
  Filled 2019-12-20: qty 2

## 2019-12-20 NOTE — Progress Notes (Signed)
OK to give Aranesp today with BP-170/66 per order of Dr. Marin Olp.

## 2019-12-20 NOTE — Progress Notes (Signed)
Per Dr. Marin Olp ok to use lab values from 12/13/2019 for today's injection (12/20/2019).

## 2019-12-22 DIAGNOSIS — E785 Hyperlipidemia, unspecified: Secondary | ICD-10-CM | POA: Diagnosis not present

## 2019-12-22 DIAGNOSIS — I10 Essential (primary) hypertension: Secondary | ICD-10-CM | POA: Diagnosis not present

## 2019-12-22 DIAGNOSIS — E119 Type 2 diabetes mellitus without complications: Secondary | ICD-10-CM | POA: Diagnosis not present

## 2019-12-22 DIAGNOSIS — D509 Iron deficiency anemia, unspecified: Secondary | ICD-10-CM | POA: Diagnosis not present

## 2019-12-28 ENCOUNTER — Ambulatory Visit: Payer: Medicare Other | Admitting: Internal Medicine

## 2019-12-28 ENCOUNTER — Other Ambulatory Visit: Payer: Self-pay

## 2019-12-28 ENCOUNTER — Encounter: Payer: Self-pay | Admitting: Internal Medicine

## 2019-12-28 VITALS — BP 136/82 | HR 48 | Ht 67.0 in | Wt 245.0 lb

## 2019-12-28 DIAGNOSIS — I480 Paroxysmal atrial fibrillation: Secondary | ICD-10-CM | POA: Diagnosis not present

## 2019-12-28 DIAGNOSIS — I5181 Takotsubo syndrome: Secondary | ICD-10-CM

## 2019-12-28 DIAGNOSIS — I10 Essential (primary) hypertension: Secondary | ICD-10-CM | POA: Diagnosis not present

## 2019-12-28 DIAGNOSIS — R001 Bradycardia, unspecified: Secondary | ICD-10-CM | POA: Diagnosis not present

## 2019-12-28 MED ORDER — RIVAROXABAN 20 MG PO TABS: 20.0000 mg | ORAL_TABLET | Freq: Every day | ORAL | 11 refills | Status: DC

## 2019-12-28 NOTE — Patient Instructions (Addendum)
Medication Instructions:  Your physician recommends that you continue on your current medications as directed. Please refer to the Current Medication list given to you today.  Labwork: None ordered.  Testing/Procedures: None ordered.  Follow-Up: Your physician wants you to follow-up in: 6 months with Renee Ursuy, PA. You will receive a reminder letter in the mail two months in advance. If you don't receive a letter, please call our office to schedule the follow-up appointment.   Any Other Special Instructions Will Be Listed Below (If Applicable).     If you need a refill on your cardiac medications before your next appointment, please call your pharmacy.  

## 2019-12-28 NOTE — Progress Notes (Signed)
PCP: Jani Gravel, MD   Primary EP: Dr Rayann Heman  Gloria Joseph is a 79 y.o. female who presents today for routine electrophysiology followup.  Since last being seen in our clinic, the patient reports doing very well.  Her son died of stroke last year.  She continues to grieve.  + fatigue.  Today, she denies symptoms of palpitations, chest pain, shortness of breath,  lower extremity edema, dizziness, presyncope, or syncope.  The patient is otherwise without complaint today.   Past Medical History:  Diagnosis Date  . Anemia 12/18/2011  . Atrial fibrillation ([place]) 04/13/2011  . Cancer [facility])    breast CA s/p Right mastectomy  . Diabetes mellitus, type 2 (HCC)    diet controlled  . Erythropoietin deficiency anemia 12/06/2019  . Hx of echocardiogram    Echocardiogram (12/15): Moderate LVH, EF 55-60%, normal wall motion, grade 1 diastolic dysfunction, aortic sclerosis without stenosis, mild AI, MAC  . Hyperlipidemia   . Hypertension    medicine refractory  . Hypothyroidism    s/p thryroid gland ablation  . Neuromuscular disorder ([place])   . NSTEMI (non-ST elevated myocardial infarction) ([place]) 05/13/2018  . Osteopenia   . Renal insufficiency, mild 09/23/2013  . Stroke [facility]) 05/15/2018   Past Surgical History:  Procedure Laterality Date  . BREAST ENHANCEMENT SURGERY    . BREAST IMPLANT REMOVAL  2000   Unilaterally   . BREAST SURGERY    . CHOLECYSTECTOMY    . COLONOSCOPY  2002   Neg  . CORONARY ANGIOGRAPHY N/A 05/15/2018   Procedure: CORONARY ANGIOGRAPHY;  Surgeon: Burnell Blanks, MD;  Location: [place] CV LAB;  Service: Cardiovascular;  Laterality: N/A;  . IR ANGIO INTRA EXTRACRAN SEL COM CAROTID INNOMINATE BILAT MOD SED  05/15/2018  . IR ANGIO VERTEBRAL SEL SUBCLAVIAN INNOMINATE UNI R MOD SED  05/15/2018  . MASTECTOMY    . RADIOLOGY WITH ANESTHESIA N/A 05/15/2018   Procedure: IR WITH ANESTHESIA;  Surgeon: Luanne Bras, MD;  Location: [place];  Service: Radiology;   Laterality: N/A;  . RAI  04/04/2006  . TUBAL LIGATION      ROS- all systems are reviewed and negatives except as per HPI above  Current Outpatient Medications  Medication Sig Dispense Refill  . CALCIUM-MAGNESIUM-ZINC PO Take 1 tablet by mouth daily.    . carvedilol (COREG) 3.125 MG tablet Take 1 tablet by mouth 2 (two) times daily.    . Cholecalciferol (VITAMIN D-3) 1000 UNITS CAPS Take 1 capsule by mouth daily.    . citalopram (CELEXA) 20 MG tablet Take 20 mg by mouth daily.    . Coenzyme Q10 50 MG CAPS Take 50 mg by mouth daily.    . ferrous sulfate 325 (65 FE) MG EC tablet Take 325 mg by mouth daily.    . Flaxseed, Linseed, (FLAXSEED OIL PO) Take 1 tablet by mouth daily.    Marland Kitchen gabapentin (NEURONTIN) 300 MG capsule at bedtime.     Marland Kitchen glimepiride (AMARYL) 1 MG tablet Take 0.5 mg by mouth every morning.    Marland Kitchen levothyroxine (SYNTHROID, LEVOTHROID) 112 MCG tablet Take 1 tablet (112 mcg) by mouth daily. EXCEPT Tuesdays and Thursdays take 1/2 tablet (56 mcg) by mouth daily.    Marland Kitchen losartan (COZAAR) 100 MG tablet Take 100 mg by mouth daily.    . metFORMIN (GLUCOPHAGE) 1000 MG tablet Take 1,000 mg by mouth daily.    . Multiple Vitamins-Minerals (MULTIVITAMINS) CHEW Chew 1 tablet by mouth daily.    . nitroGLYCERIN (  NITROSTAT) 0.4 MG SL tablet Place 1 tablet (0.4 mg total) under the tongue every 5 (five) minutes as needed for chest pain. 30 tablet 2  . Omega-3 Fatty Acids (FISH OIL) 1000 MG CAPS Take 1 g by mouth daily.    . pravastatin (PRAVACHOL) 80 MG tablet Take 80 mg by mouth daily.     . vitamin C (ASCORBIC ACID) 250 MG tablet Take 250 mg by mouth daily.    Alveda Reasons 20 MG TABS tablet TAKE 1 TABLET (20 MG TOTAL) BY MOUTH DAILY WITH SUPPER. APPT REQUIRED FOR REFILLS [phone] 30 tablet 0   No current facility-administered medications for this visit.    Physical Exam: Vitals:   12/28/19 1544  BP: 136/82  Pulse: (!) 48  SpO2: 98%  Weight: 245 lb (111.1 kg)  Height: _0  (1.702 m)     GEN- The patient is obese appearing, alert and oriented x 3 today.   Head- normocephalic, atraumatic Eyes-  Sclera clear, conjunctiva pink Ears- hearing intact Oropharynx- clear Lungs-  normal work of breathing Heart- Regular rate and rhythm  GI- soft  Extremities- no clubbing, cyanosis, or edema  Wt Readings from Last 3 Encounters:  12/28/19 245 lb (111.1 kg)  12/13/19 (!) 243 lb (110.2 kg)  11/25/19 242 lb (109.8 kg)    EKG tracing ordered today is personally reviewed and shows sinus bradycardia, first degree AV block  Assessment and Plan:  1. Paroxysmal atrial fibrillation maintaining sinus Continue coumadin  2. HTN Stable No change required today  3. Sinus bradycardia Asymptomatic No changes  4. Venous insufficiency Stable No change required today  5. Obesity Body mass index is 38.37 kg/m. Lifestyle modification is advised  Risks, benefits and potential toxicities for medications prescribed and/or refilled reviewed with patient today.   Return to see Dr Charlcie Cradle every 6 months I will see when needed  Thompson Grayer MD, [facility] 12/28/2019 3:56 PM

## 2019-12-29 DIAGNOSIS — I63412 Cerebral infarction due to embolism of left middle cerebral artery: Secondary | ICD-10-CM | POA: Diagnosis not present

## 2019-12-29 DIAGNOSIS — I480 Paroxysmal atrial fibrillation: Secondary | ICD-10-CM | POA: Diagnosis not present

## 2019-12-29 DIAGNOSIS — E039 Hypothyroidism, unspecified: Secondary | ICD-10-CM | POA: Diagnosis not present

## 2019-12-29 DIAGNOSIS — E785 Hyperlipidemia, unspecified: Secondary | ICD-10-CM | POA: Diagnosis not present

## 2019-12-29 DIAGNOSIS — I10 Essential (primary) hypertension: Secondary | ICD-10-CM | POA: Diagnosis not present

## 2020-01-11 DIAGNOSIS — M7061 Trochanteric bursitis, right hip: Secondary | ICD-10-CM | POA: Diagnosis not present

## 2020-01-11 DIAGNOSIS — M25551 Pain in right hip: Secondary | ICD-10-CM | POA: Diagnosis not present

## 2020-01-13 ENCOUNTER — Inpatient Hospital Stay: Payer: Medicare Other

## 2020-01-13 ENCOUNTER — Telehealth: Payer: Self-pay | Admitting: Family

## 2020-01-13 ENCOUNTER — Other Ambulatory Visit: Payer: Self-pay

## 2020-01-13 ENCOUNTER — Inpatient Hospital Stay (HOSPITAL_BASED_OUTPATIENT_CLINIC_OR_DEPARTMENT_OTHER): Payer: Medicare Other | Admitting: Hematology & Oncology

## 2020-01-13 ENCOUNTER — Encounter: Payer: Self-pay | Admitting: Hematology & Oncology

## 2020-01-13 VITALS — BP 108/25 | HR 42 | Temp 99.6°F | Resp 18 | Wt 241.0 lb

## 2020-01-13 DIAGNOSIS — D631 Anemia in chronic kidney disease: Secondary | ICD-10-CM

## 2020-01-13 DIAGNOSIS — N189 Chronic kidney disease, unspecified: Secondary | ICD-10-CM | POA: Diagnosis not present

## 2020-01-13 DIAGNOSIS — E1122 Type 2 diabetes mellitus with diabetic chronic kidney disease: Secondary | ICD-10-CM | POA: Diagnosis not present

## 2020-01-13 LAB — CMP (CANCER CENTER ONLY)
ALT: 10 U/L (ref 0–44)
AST: 12 U/L — ABNORMAL LOW (ref 15–41)
Albumin: 4.2 g/dL (ref 3.5–5.0)
Alkaline Phosphatase: 64 U/L (ref 38–126)
Anion gap: 8 (ref 5–15)
BUN: 24 mg/dL — ABNORMAL HIGH (ref 8–23)
CO2: 26 mmol/L (ref 22–32)
Calcium: 10.2 mg/dL (ref 8.9–10.3)
Chloride: 106 mmol/L (ref 98–111)
Creatinine: 1.3 mg/dL — ABNORMAL HIGH (ref 0.44–1.00)
GFR, Est AFR Am: 45 mL/min — ABNORMAL LOW (ref >60)
GFR, Estimated: 39 mL/min — ABNORMAL LOW (ref >60)
Glucose, Bld: 173 mg/dL — ABNORMAL HIGH (ref 70–99)
Potassium: 4.7 mmol/L (ref 3.5–5.1)
Sodium: 140 mmol/L (ref 135–145)
Total Bilirubin: 0.5 mg/dL (ref 0.3–1.2)
Total Protein: 7 g/dL (ref 6.5–8.1)

## 2020-01-13 LAB — CBC WITH DIFFERENTIAL (CANCER CENTER ONLY)
Abs Immature Granulocytes: 0.1 10*3/uL — ABNORMAL HIGH (ref 0.00–0.07)
Basophils Absolute: 0 10*3/uL (ref 0.0–0.1)
Basophils Relative: 1 %
Eosinophils Absolute: 0 10*3/uL (ref 0.0–0.5)
Eosinophils Relative: 1 %
HCT: 33.5 % — ABNORMAL LOW (ref 36.0–46.0)
Hemoglobin: 10.8 g/dL — ABNORMAL LOW (ref 12.0–15.0)
Immature Granulocytes: 2 %
Lymphocytes Relative: 18 %
Lymphs Abs: 1.2 10*3/uL (ref 0.7–4.0)
MCH: 29.7 pg (ref 26.0–34.0)
MCHC: 32.2 g/dL (ref 30.0–36.0)
MCV: 92 fL (ref 80.0–100.0)
Monocytes Absolute: 0.4 10*3/uL (ref 0.1–1.0)
Monocytes Relative: 6 %
Neutro Abs: 5 10*3/uL (ref 1.7–7.7)
Neutrophils Relative %: 72 %
Platelet Count: 246 10*3/uL (ref 150–400)
RBC: 3.64 MIL/uL — ABNORMAL LOW (ref 3.87–5.11)
RDW: 15.5 % (ref 11.5–15.5)
WBC Count: 6.7 10*3/uL (ref 4.0–10.5)
nRBC: 0 % (ref 0.0–0.2)

## 2020-01-13 LAB — IRON AND TIBC
Iron: 80 ug/dL (ref 41–142)
Saturation Ratios: 21 % (ref 21–57)
TIBC: 383 ug/dL (ref 236–444)
UIBC: 303 ug/dL (ref 120–384)

## 2020-01-13 LAB — RETICULOCYTES
Immature Retic Fract: 11.6 % (ref 2.3–15.9)
RBC.: 3.63 MIL/uL — ABNORMAL LOW (ref 3.87–5.11)
Retic Count, Absolute: 61.3 10*3/uL (ref 19.0–186.0)
Retic Ct Pct: 1.7 % (ref 0.4–3.1)

## 2020-01-13 LAB — SAVE SMEAR(SSMR), FOR PROVIDER SLIDE REVIEW

## 2020-01-13 LAB — FERRITIN: Ferritin: 25 ng/mL (ref 11–307)

## 2020-01-13 MED ORDER — EPOETIN ALFA-EPBX 10000 UNIT/ML IJ SOLN
INTRAMUSCULAR | Status: AC
  Filled 2020-01-13: qty 2

## 2020-01-13 MED ORDER — EPOETIN ALFA-EPBX 10000 UNIT/ML IJ SOLN
20000.0000 [IU] | Freq: Once | INTRAMUSCULAR | Status: AC
  Administered 2020-01-13: 20000 [IU] via SUBCUTANEOUS

## 2020-01-13 MED ORDER — EPOETIN ALFA-EPBX 40000 UNIT/ML IJ SOLN: 20000.0000 [IU] | Freq: Once | INTRAMUSCULAR | Status: DC

## 2020-01-13 NOTE — Telephone Encounter (Signed)
Appointments scheduled calendar printed & mailed per 8/26 los 

## 2020-01-13 NOTE — Patient Instructions (Signed)

## 2020-01-13 NOTE — Progress Notes (Signed)
Hematology and Oncology Follow Up Visit  Gloria Joseph [id number] Sep 07, 1940 79 y.o. 01/13/2020   Principle Diagnosis:   Erythropoietin deficient anemia  Transient iron deficiency anemia  Non-insulin-dependent diabetes with chronic renal insufficiency  Current Therapy:    Aranesp 300 mcg subcu every 3 weeks for hemoglobin less than 11  IV iron as indicated     Interim History:  Gloria Joseph is back for follow-up.  We have now started her on Aranesp.  We have had to in order to try to get her hemoglobin better.  The hemoglobin is trending upward slowly.  She has had no problems with the Aranesp.  There was an episode where her blood pressure was on the high side.  I am unsure if this really was from Aranesp.  Today, her blood pressure is normal.  She has had no problems with headache.  She has had no chest wall pain.  She has had no cough or shortness of breath.  She has had no change in bowel or bladder habits.  When we last checked her iron studies back in early July, her ferritin was 20 with an iron saturation of 62%.  She has had no rashes.  There has been no leg swelling.  Overall, her performance status is ECOG 1.  Medications:  Current Outpatient Medications:  .  CALCIUM-MAGNESIUM-ZINC PO, Take 1 tablet by mouth daily., Disp: , Rfl:  .  carvedilol (COREG) 3.125 MG tablet, Take 1 tablet by mouth 2 (two) times daily., Disp: , Rfl:  .  Cholecalciferol (VITAMIN D-3) 1000 UNITS CAPS, Take 1 capsule by mouth daily., Disp: , Rfl:  .  citalopram (CELEXA) 20 MG tablet, Take 20 mg by mouth daily., Disp: , Rfl:  .  Coenzyme Q10 50 MG CAPS, Take 50 mg by mouth daily., Disp: , Rfl:  .  ferrous sulfate 325 (65 FE) MG EC tablet, Take 325 mg by mouth daily., Disp: , Rfl:  .  Flaxseed, Linseed, (FLAXSEED OIL PO), Take 1 tablet by mouth daily., Disp: , Rfl:  .  gabapentin (NEURONTIN) 300 MG capsule, at bedtime. , Disp: , Rfl:  .  glimepiride (AMARYL) 1 MG tablet, Take 0.5 mg by  mouth every morning., Disp: , Rfl:  .  levothyroxine (SYNTHROID, LEVOTHROID) 112 MCG tablet, Take 1 tablet (112 mcg) by mouth daily. EXCEPT Tuesdays and Thursdays take 1/2 tablet (56 mcg) by mouth daily., Disp: , Rfl:  .  losartan (COZAAR) 100 MG tablet, Take 100 mg by mouth daily., Disp: , Rfl:  .  metFORMIN (GLUCOPHAGE) 1000 MG tablet, Take 1,000 mg by mouth daily., Disp: , Rfl:  .  Multiple Vitamins-Minerals (MULTIVITAMINS) CHEW, Chew 1 tablet by mouth daily., Disp: , Rfl:  .  nitroGLYCERIN (NITROSTAT) 0.4 MG SL tablet, Place 1 tablet (0.4 mg total) under the tongue every 5 (five) minutes as needed for chest pain., Disp: 30 tablet, Rfl: 2 .  Omega-3 Fatty Acids (FISH OIL) 1000 MG CAPS, Take 1 g by mouth daily., Disp: , Rfl:  .  pravastatin (PRAVACHOL) 80 MG tablet, Take 80 mg by mouth daily. , Disp: , Rfl:  .  rivaroxaban (XARELTO) 20 MG TABS tablet, Take 1 tablet (20 mg total) by mouth daily with supper., Disp: 30 tablet, Rfl: 11 .  vitamin C (ASCORBIC ACID) 250 MG tablet, Take 250 mg by mouth daily., Disp: , Rfl:   Allergies:  Allergies  Allergen Reactions  . Lisinopril Other (See Comments)    ? Angioedema (NOT ARB candidate) Because  of a history of documented adverse serious drug reaction;Medi Alert bracelet  is recommended ? Angioedema (NOT ARB candidate) Because of a history of documented adverse serious drug reaction;Medi Alert braceletis recommended ? Angioedema (NOT ARB candidate) Because of a history of documented adverse serious drug reaction;Medi Alert braceletis recommended ? Angioedema (NOT ARB candidate) Because of a history of documented adverse serious drug reaction;Medi Alert bracelet is recommended  ? Angioedema (NOT ARB candidate) Because of a history of documented adverse serious drug reaction;Medi Alert braceletis recommended ? Angioedema (NOT ARB candidate) Because of a history of documented adverse serious drug reaction;Medi Alert bracelet is recommended    . Hydrochlorothiazide Other (See Comments)    hyponatremia hyponatremia Hyponatremia. Hyponatremia.  Marland Kitchen Spironolactone     REACTION: low potassium ???  . Amlodipine   . Apixaban   . Penicillins Other (See Comments)    Pt states medication doesn't work for her d/t her usage of it several years ago.  . Amlodipine Besylate     REACTION: edema    Past Medical History, Surgical history, Social history, and Family History were reviewed and updated.  Review of Systems: Review of Systems  Constitutional: Positive for fatigue.  HENT:  Negative.   Eyes: Negative.   Respiratory: Positive for shortness of breath.   Cardiovascular: Negative.   Gastrointestinal: Negative.   Endocrine: Negative.   Genitourinary: Negative.    Musculoskeletal: Positive for arthralgias and myalgias.  Skin: Negative.   Neurological: Negative.   Hematological: Negative.   Psychiatric/Behavioral: Negative.     Physical Exam:  weight is 241 lb (109.3 kg). Her oral temperature is 99.6 F (37.6 C). Her blood pressure is 108/25 (abnormal) and her pulse is 42 (abnormal). Her respiration is 18 and oxygen saturation is 98%.   Wt Readings from Last 3 Encounters:  01/13/20 241 lb (109.3 kg)  12/28/19 245 lb (111.1 kg)  12/13/19 (!) 243 lb (110.2 kg)    Physical Exam Vitals reviewed.  HENT:     Head: Normocephalic and atraumatic.  Eyes:     Pupils: Pupils are equal, round, and reactive to light.  Cardiovascular:     Rate and Rhythm: Normal rate and regular rhythm.     Heart sounds: Normal heart sounds.  Pulmonary:     Effort: Pulmonary effort is normal.     Breath sounds: Normal breath sounds.  Abdominal:     General: Bowel sounds are normal.     Palpations: Abdomen is soft.  Musculoskeletal:        General: No tenderness or deformity. Normal range of motion.     Cervical back: Normal range of motion.  Lymphadenopathy:     Cervical: No cervical adenopathy.  Skin:    General: Skin is warm and dry.      Findings: No erythema or rash.  Neurological:     Mental Status: She is alert and oriented to person, place, and time.  Psychiatric:        Behavior: Behavior normal.        Thought Content: Thought content normal.        Judgment: Judgment normal.      Lab Results  Component Value Date   WBC 6.7 01/13/2020   HGB 10.8 (L) 01/13/2020   HCT 33.5 (L) 01/13/2020   MCV 92.0 01/13/2020   PLT 246 01/13/2020     Chemistry      Component Value Date/Time   NA 140 01/13/2020 0829   K 4.7 01/13/2020 0829   CL  106 01/13/2020 0829   CO2 26 01/13/2020 0829   BUN 24 (H) 01/13/2020 0829   CREATININE 1.30 (H) 01/13/2020 0829      Component Value Date/Time   CALCIUM 10.2 01/13/2020 0829   ALKPHOS 64 01/13/2020 0829   AST 12 (L) 01/13/2020 0829   ALT 10 01/13/2020 0829   BILITOT 0.5 01/13/2020 0829      Impression and Plan: Ms. Bohle is a very charming 79 year old white female.  Again, I suspect that she has erythropoietin deficiency anemia.  I we will go ahead with Aranesp today.  Hopefully, we will get her above 11.  She has a wedding that she is going to over Labor Day weekend.  I would like to see her feel better.  I will plan to get her back in another 3 weeks for follow-up.  I know that she is very proactive.  She has a very strong faith.    Volanda Napoleon, MD 8/26/20219:12 AM

## 2020-02-02 ENCOUNTER — Encounter: Payer: Self-pay | Admitting: Family

## 2020-02-02 ENCOUNTER — Inpatient Hospital Stay: Payer: Medicare Other

## 2020-02-02 ENCOUNTER — Inpatient Hospital Stay (HOSPITAL_BASED_OUTPATIENT_CLINIC_OR_DEPARTMENT_OTHER): Payer: Medicare Other | Admitting: Family

## 2020-02-02 ENCOUNTER — Telehealth: Payer: Self-pay | Admitting: Hematology & Oncology

## 2020-02-02 ENCOUNTER — Other Ambulatory Visit: Payer: Self-pay

## 2020-02-02 ENCOUNTER — Inpatient Hospital Stay: Payer: Medicare Other | Attending: Hematology & Oncology

## 2020-02-02 VITALS — BP 180/96 | HR 82 | Temp 98.0°F | Resp 19 | Ht 67.0 in | Wt 236.1 lb

## 2020-02-02 DIAGNOSIS — D649 Anemia, unspecified: Secondary | ICD-10-CM | POA: Insufficient documentation

## 2020-02-02 DIAGNOSIS — D631 Anemia in chronic kidney disease: Secondary | ICD-10-CM | POA: Diagnosis not present

## 2020-02-02 DIAGNOSIS — D500 Iron deficiency anemia secondary to blood loss (chronic): Secondary | ICD-10-CM

## 2020-02-02 LAB — CMP (CANCER CENTER ONLY)
ALT: 13 U/L (ref 0–44)
AST: 14 U/L — ABNORMAL LOW (ref 15–41)
Albumin: 4 g/dL (ref 3.5–5.0)
Alkaline Phosphatase: 54 U/L (ref 38–126)
Anion gap: 8 (ref 5–15)
BUN: 18 mg/dL (ref 8–23)
CO2: 30 mmol/L (ref 22–32)
Calcium: 10.3 mg/dL (ref 8.9–10.3)
Chloride: 105 mmol/L (ref 98–111)
Creatinine: 1.16 mg/dL — ABNORMAL HIGH (ref 0.44–1.00)
GFR, Est AFR Am: 52 mL/min — ABNORMAL LOW (ref >60)
GFR, Estimated: 45 mL/min — ABNORMAL LOW (ref >60)
Glucose, Bld: 161 mg/dL — ABNORMAL HIGH (ref 70–99)
Potassium: 4.6 mmol/L (ref 3.5–5.1)
Sodium: 143 mmol/L (ref 135–145)
Total Bilirubin: 0.7 mg/dL (ref 0.3–1.2)
Total Protein: 6.9 g/dL (ref 6.5–8.1)

## 2020-02-02 LAB — CBC WITH DIFFERENTIAL (CANCER CENTER ONLY)
Abs Immature Granulocytes: 0.01 10*3/uL (ref 0.00–0.07)
Basophils Absolute: 0.1 10*3/uL (ref 0.0–0.1)
Basophils Relative: 1 %
Eosinophils Absolute: 0.1 10*3/uL (ref 0.0–0.5)
Eosinophils Relative: 2 %
HCT: 38.2 % (ref 36.0–46.0)
Hemoglobin: 12.3 g/dL (ref 12.0–15.0)
Immature Granulocytes: 0 %
Lymphocytes Relative: 25 %
Lymphs Abs: 1.5 10*3/uL (ref 0.7–4.0)
MCH: 29.7 pg (ref 26.0–34.0)
MCHC: 32.2 g/dL (ref 30.0–36.0)
MCV: 92.3 fL (ref 80.0–100.0)
Monocytes Absolute: 0.4 10*3/uL (ref 0.1–1.0)
Monocytes Relative: 6 %
Neutro Abs: 3.8 10*3/uL (ref 1.7–7.7)
Neutrophils Relative %: 66 %
Platelet Count: 224 10*3/uL (ref 150–400)
RBC: 4.14 MIL/uL (ref 3.87–5.11)
RDW: 14.4 % (ref 11.5–15.5)
WBC Count: 5.9 10*3/uL (ref 4.0–10.5)
nRBC: 0 % (ref 0.0–0.2)

## 2020-02-02 LAB — IRON AND TIBC
Iron: 99 ug/dL (ref 41–142)
Saturation Ratios: 26 % (ref 21–57)
TIBC: 378 ug/dL (ref 236–444)
UIBC: 278 ug/dL (ref 120–384)

## 2020-02-02 LAB — RETICULOCYTES
Immature Retic Fract: 8.2 % (ref 2.3–15.9)
RBC.: 4.17 MIL/uL (ref 3.87–5.11)
Retic Count, Absolute: 64.2 10*3/uL (ref 19.0–186.0)
Retic Ct Pct: 1.5 % (ref 0.4–3.1)

## 2020-02-02 LAB — FERRITIN: Ferritin: 34 ng/mL (ref 11–307)

## 2020-02-02 NOTE — Telephone Encounter (Signed)
Appointments scheduled calendar printed & mailed per 9/15 los

## 2020-02-02 NOTE — Progress Notes (Signed)
Hematology and Oncology Follow Up Visit  Gloria Joseph [id number] 1940/12/25 79 y.o. 02/02/2020   Principle Diagnosis:  Erythropoietin deficient anemia Transient iron deficiency anemia Non-insulin-dependent diabetes with chronic renal insufficiency  Current Therapy:        Retacrit SQ every 3 weeks for hemoglobin less than 11 IV iron as indicated   Interim History:  Gloria Joseph is here today for follow-up. She is doing well but does have some fatigue at times.  Hgb is up to 12.3, MCV 92, platelets 224 and WBC count is 5.9.  She has had several falls recently and thankfully has not been seriously injured. She states that she has seen her orthopedist Gloria Joseph and was diagnosed with a body imbalance. She will be starting PT to help correct this.   She ambulates with a cane for added support and also has a walker at home.  No fever, chills, n/v, cough, rash, dizziness, SOB, chest pain, palpitations, abdominal pain or changes in bowel or bladder habits.  No swelling, tenderness, numbness or tingling in her extremities.  No episodes of bleeding. No bruising or petechiae.  She has maintained a good appetite and is staying well hydrated. Her weight is stable.   ECOG Performance Status: 1 - Symptomatic but completely ambulatory  Medications:  Allergies as of 02/02/2020      Reactions   Lisinopril Other (See Comments)   Hydrochlorothiazide Other (See Comments)   Hyponatremia   Spironolactone Other (See Comments)   Hypokalemia   Apixaban    Penicillins Other (See Comments)   Pt states medication doesn't work for her d/t her usage of it several years ago.   Amlodipine Besylate    REACTION: edema      Medication List       Accurate as of February 02, 2020  9:22 AM. If you have any questions, ask your nurse or doctor.        CALCIUM-MAGNESIUM-ZINC PO Take 1 tablet by mouth daily.   carvedilol 3.125 MG tablet Commonly known as: COREG Take 1 tablet by mouth 2 (two) times  daily.   citalopram 20 MG tablet Commonly known as: CELEXA Take 20 mg by mouth daily.   Coenzyme Q10 50 MG Caps Take 50 mg by mouth daily.   ferrous sulfate 325 (65 FE) MG EC tablet Take 325 mg by mouth daily.   Fish Oil 1000 MG Caps Take 1 g by mouth daily.   FLAXSEED OIL PO Take 1 tablet by mouth daily.   gabapentin 300 MG capsule Commonly known as: NEURONTIN at bedtime.   glimepiride 1 MG tablet Commonly known as: AMARYL Take 0.5 mg by mouth every morning.   levothyroxine 112 MCG tablet Commonly known as: SYNTHROID Take 1 tablet (112 mcg) by mouth daily. EXCEPT Tuesdays and Thursdays take 1/2 tablet (56 mcg) by mouth daily.   losartan 100 MG tablet Commonly known as: COZAAR Take 100 mg by mouth daily.   metFORMIN 1000 MG tablet Commonly known as: GLUCOPHAGE Take 1,000 mg by mouth daily.   Multivitamins Chew Chew 1 tablet by mouth daily.   nitroGLYCERIN 0.4 MG SL tablet Commonly known as: NITROSTAT Place 1 tablet (0.4 mg total) under the tongue every 5 (five) minutes as needed for chest pain.   pravastatin 80 MG tablet Commonly known as: PRAVACHOL Take 80 mg by mouth daily.   rivaroxaban 20 MG Tabs tablet Commonly known as: Xarelto Take 1 tablet (20 mg total) by mouth daily with supper.   vitamin C  250 MG tablet Commonly known as: ASCORBIC ACID Take 250 mg by mouth daily.   Vitamin D-3 25 MCG (1000 UT) Caps Take 1 capsule by mouth daily.       Allergies:  Allergies  Allergen Reactions  . Lisinopril Other (See Comments)  . Hydrochlorothiazide Other (See Comments)    Hyponatremia  . Spironolactone Other (See Comments)    Hypokalemia  . Apixaban   . Penicillins Other (See Comments)    Pt states medication doesn't work for her d/t her usage of it several years ago.  . Amlodipine Besylate     REACTION: edema    Past Medical History, Surgical history, Social history, and Family History were reviewed and updated.  Review of Systems: All  other 10 point review of systems is negative.   Physical Exam:  vitals were not taken for this visit.   Wt Readings from Last 3 Encounters:  01/13/20 241 lb (109.3 kg)  12/28/19 245 lb (111.1 kg)  12/13/19 (!) 243 lb (110.2 kg)    Ocular: Sclerae unicteric, pupils equal, round and reactive to light Ear-nose-throat: Oropharynx clear, dentition fair Lymphatic: No cervical or supraclavicular adenopathy Lungs no rales or rhonchi, good excursion bilaterally Heart regular rate and rhythm, no murmur appreciated Abd soft, nontender, positive bowel sounds MSK no focal spinal tenderness, no joint edema Neuro: non-focal, well-oriented, appropriate affect Breasts: Deferred   Lab Results  Component Value Date   WBC 5.9 02/02/2020   HGB 12.3 02/02/2020   HCT 38.2 02/02/2020   MCV 92.3 02/02/2020   PLT 224 02/02/2020   Lab Results  Component Value Date   FERRITIN 25 01/13/2020   IRON 80 01/13/2020   TIBC 383 01/13/2020   UIBC 303 01/13/2020   IRONPCTSAT 21 01/13/2020   Lab Results  Component Value Date   RETICCTPCT 1.5 02/02/2020   RBC 4.17 02/02/2020   RBC 4.14 02/02/2020   No results found for: KPAFRELGTCHN, LAMBDASER, KAPLAMBRATIO No results found for: IGGSERUM, IGA, IGMSERUM No results found for: Ronnald Ramp, A1GS, A2GS, Tillman Sers, SPEI   Chemistry      Component Value Date/Time   NA 143 02/02/2020 0820   K 4.6 02/02/2020 0820   CL 105 02/02/2020 0820   CO2 30 02/02/2020 0820   BUN 18 02/02/2020 0820   CREATININE 1.16 (H) 02/02/2020 0820      Component Value Date/Time   CALCIUM 10.3 02/02/2020 0820   ALKPHOS 54 02/02/2020 0820   AST 14 (L) 02/02/2020 0820   ALT 13 02/02/2020 0820   BILITOT 0.7 02/02/2020 0820       Impression and Plan: Gloria Joseph is a very pleasant 79 yo female with multifactorial anemia.  No Retacrit needed today, Hgb 12.3.  Iron studies are pending. We can replace if needed.  We will plan to see her again in  another 4 weeks.  She can contact our office with any questions or concerns. We can certainly see her sooner if needed.   Laverna Peace, NP 9/15/20219:22 AM

## 2020-03-01 ENCOUNTER — Inpatient Hospital Stay (HOSPITAL_BASED_OUTPATIENT_CLINIC_OR_DEPARTMENT_OTHER): Payer: Medicare Other | Admitting: Hematology & Oncology

## 2020-03-01 ENCOUNTER — Encounter: Payer: Self-pay | Admitting: Hematology & Oncology

## 2020-03-01 ENCOUNTER — Telehealth: Payer: Self-pay | Admitting: Hematology & Oncology

## 2020-03-01 ENCOUNTER — Other Ambulatory Visit: Payer: Self-pay

## 2020-03-01 ENCOUNTER — Inpatient Hospital Stay: Payer: Medicare Other

## 2020-03-01 ENCOUNTER — Inpatient Hospital Stay: Payer: Medicare Other | Attending: Hematology & Oncology

## 2020-03-01 VITALS — BP 146/59 | HR 72 | Temp 99.8°F | Resp 18 | Wt 232.0 lb

## 2020-03-01 DIAGNOSIS — D500 Iron deficiency anemia secondary to blood loss (chronic): Secondary | ICD-10-CM

## 2020-03-01 DIAGNOSIS — D631 Anemia in chronic kidney disease: Secondary | ICD-10-CM

## 2020-03-01 DIAGNOSIS — D509 Iron deficiency anemia, unspecified: Secondary | ICD-10-CM | POA: Diagnosis not present

## 2020-03-01 DIAGNOSIS — Z7984 Long term (current) use of oral hypoglycemic drugs: Secondary | ICD-10-CM | POA: Diagnosis not present

## 2020-03-01 DIAGNOSIS — N189 Chronic kidney disease, unspecified: Secondary | ICD-10-CM | POA: Insufficient documentation

## 2020-03-01 DIAGNOSIS — Z79899 Other long term (current) drug therapy: Secondary | ICD-10-CM | POA: Insufficient documentation

## 2020-03-01 DIAGNOSIS — E1122 Type 2 diabetes mellitus with diabetic chronic kidney disease: Secondary | ICD-10-CM | POA: Insufficient documentation

## 2020-03-01 LAB — IRON AND TIBC
Iron: 71 ug/dL (ref 41–142)
Saturation Ratios: 21 % (ref 21–57)
TIBC: 333 ug/dL (ref 236–444)
UIBC: 262 ug/dL (ref 120–384)

## 2020-03-01 LAB — CMP (CANCER CENTER ONLY)
ALT: 10 U/L (ref 0–44)
AST: 16 U/L (ref 15–41)
Albumin: 3.9 g/dL (ref 3.5–5.0)
Alkaline Phosphatase: 51 U/L (ref 38–126)
Anion gap: 8 (ref 5–15)
BUN: 17 mg/dL (ref 8–23)
CO2: 27 mmol/L (ref 22–32)
Calcium: 9.7 mg/dL (ref 8.9–10.3)
Chloride: 105 mmol/L (ref 98–111)
Creatinine: 1.33 mg/dL — ABNORMAL HIGH (ref 0.44–1.00)
GFR, Estimated: 38 mL/min — ABNORMAL LOW (ref >60)
Glucose, Bld: 128 mg/dL — ABNORMAL HIGH (ref 70–99)
Potassium: 4.4 mmol/L (ref 3.5–5.1)
Sodium: 140 mmol/L (ref 135–145)
Total Bilirubin: 0.5 mg/dL (ref 0.3–1.2)
Total Protein: 6.8 g/dL (ref 6.5–8.1)

## 2020-03-01 LAB — CBC WITH DIFFERENTIAL (CANCER CENTER ONLY)
Abs Immature Granulocytes: 0.02 10*3/uL (ref 0.00–0.07)
Basophils Absolute: 0.1 10*3/uL (ref 0.0–0.1)
Basophils Relative: 1 %
Eosinophils Absolute: 0.2 10*3/uL (ref 0.0–0.5)
Eosinophils Relative: 4 %
HCT: 35.5 % — ABNORMAL LOW (ref 36.0–46.0)
Hemoglobin: 11.3 g/dL — ABNORMAL LOW (ref 12.0–15.0)
Immature Granulocytes: 0 %
Lymphocytes Relative: 17 %
Lymphs Abs: 1.1 10*3/uL (ref 0.7–4.0)
MCH: 29.7 pg (ref 26.0–34.0)
MCHC: 31.8 g/dL (ref 30.0–36.0)
MCV: 93.4 fL (ref 80.0–100.0)
Monocytes Absolute: 0.4 10*3/uL (ref 0.1–1.0)
Monocytes Relative: 7 %
Neutro Abs: 4.5 10*3/uL (ref 1.7–7.7)
Neutrophils Relative %: 71 %
Platelet Count: 232 10*3/uL (ref 150–400)
RBC: 3.8 MIL/uL — ABNORMAL LOW (ref 3.87–5.11)
RDW: 13.8 % (ref 11.5–15.5)
WBC Count: 6.3 10*3/uL (ref 4.0–10.5)
nRBC: 0 % (ref 0.0–0.2)

## 2020-03-01 LAB — RETICULOCYTES
Immature Retic Fract: 13.6 % (ref 2.3–15.9)
RBC.: 3.76 MIL/uL — ABNORMAL LOW (ref 3.87–5.11)
Retic Count, Absolute: 51.9 10*3/uL (ref 19.0–186.0)
Retic Ct Pct: 1.4 % (ref 0.4–3.1)

## 2020-03-01 LAB — FERRITIN: Ferritin: 44 ng/mL (ref 11–307)

## 2020-03-01 NOTE — Progress Notes (Signed)
Hematology and Oncology Follow Up Visit  Gloria Joseph [id number] [birth year] 79 y.o. 03/01/2020   Principle Diagnosis:  Erythropoietin deficient anemia Transient iron deficiency anemia Non-insulin-dependent diabetes with chronic renal insufficiency  Current Therapy:        Retacrit SQ every 3 weeks for hemoglobin less than 11 IV iron as indicated   Interim History:  Ms. Babler is here today for follow-up.  She is feeling better.  She clearly has responded well to the Retacrit.  She has not had any problems with significant fatigue or weakness.  Her hemoglobin has responded well and is come up nicely.  There is no problems with the bleeding.  She has had no change in bowel or bladder habits.  She does not have any issues with fever.  She has not taken the coronavirus vaccine.  She just does not feel comfortable taking it.  Her last iron studies back in September showed a ferritin of 34 with an iron saturation of 26%.  Overall, her performance status is ECOG 1.  Medications:  Allergies as of 03/01/2020      Reactions   Apixaban Other (See Comments)   Hallucinations   Lisinopril Other (See Comments)   Hydrochlorothiazide Other (See Comments)   Hyponatremia   Spironolactone Other (See Comments)   Hypokalemia   Amlodipine Besylate Rash   Penicillins Other (See Comments)   Pt states medication doesn't work for her d/t her usage of it several years ago.      Medication List       Accurate as of March 01, 2020 11:08 AM. If you have any questions, ask your nurse or doctor.        CALCIUM-MAGNESIUM-ZINC PO Take 1 tablet by mouth daily.   carvedilol 3.125 MG tablet Commonly known as: COREG Take 1 tablet by mouth 2 (two) times daily.   citalopram 20 MG tablet Commonly known as: CELEXA Take 20 mg by mouth daily.   cloNIDine 0.2 MG tablet Commonly known as: CATAPRES Take 0.2 mg by mouth 2 (two) times daily.   Coenzyme Q10 50 MG Caps Take 50 mg by mouth  daily.   ferrous sulfate 325 (65 FE) MG EC tablet Take 325 mg by mouth daily.   Fish Oil 1000 MG Caps Take 1 g by mouth daily.   FLAXSEED OIL PO Take 1 tablet by mouth daily.   gabapentin 300 MG capsule Commonly known as: NEURONTIN at bedtime.   glimepiride 1 MG tablet Commonly known as: AMARYL Take 0.5 mg by mouth every morning.   levothyroxine 112 MCG tablet Commonly known as: SYNTHROID Take 1 tablet (112 mcg) by mouth daily. EXCEPT Tuesdays and Thursdays take 1/2 tablet (56 mcg) by mouth daily.   losartan 100 MG tablet Commonly known as: COZAAR Take 100 mg by mouth daily.   metFORMIN 1000 MG tablet Commonly known as: GLUCOPHAGE Take 1,000 mg by mouth daily.   Multivitamins Chew Chew 1 tablet by mouth daily.   nitroGLYCERIN 0.4 MG SL tablet Commonly known as: NITROSTAT Place 1 tablet (0.4 mg total) under the tongue every 5 (five) minutes as needed for chest pain.   pravastatin 80 MG tablet Commonly known as: PRAVACHOL Take 80 mg by mouth daily.   rivaroxaban 20 MG Tabs tablet Commonly known as: Xarelto Take 1 tablet (20 mg total) by mouth daily with supper.   vitamin C 250 MG tablet Commonly known as: ASCORBIC ACID Take 250 mg by mouth daily.   Vitamin D-3 25 MCG (1000 UT)  Caps Take 1 capsule by mouth daily.       Allergies:  Allergies  Allergen Reactions  . Apixaban Other (See Comments)    Hallucinations  . Lisinopril Other (See Comments)  . Hydrochlorothiazide Other (See Comments)    Hyponatremia  . Spironolactone Other (See Comments)    Hypokalemia  . Amlodipine Besylate Rash  . Penicillins Other (See Comments)    Pt states medication doesn't work for her d/t her usage of it several years ago.    Past Medical History, Surgical history, Social history, and Family History were reviewed and updated.  Review of Systems: Review of Systems  Constitutional: Negative.   HENT: Negative.   Eyes: Negative.   Respiratory: Negative.    Cardiovascular: Negative.   Gastrointestinal: Negative.   Genitourinary: Negative.   Musculoskeletal: Negative.   Skin: Negative.   Neurological: Negative.   Endo/Heme/Allergies: Negative.   Psychiatric/Behavioral: Negative.      Physical Exam:  weight is 232 lb (105.2 kg). Her oral temperature is 99.8 F (37.7 C). Her blood pressure is 146/59 (abnormal) and her pulse is 72. Her respiration is 18 and oxygen saturation is 98%.   Wt Readings from Last 3 Encounters:  03/01/20 232 lb (105.2 kg)  02/02/20 236 lb 1.3 oz (107.1 kg)  01/13/20 241 lb (109.3 kg)    Physical Exam Vitals reviewed.  HENT:     Head: Normocephalic and atraumatic.  Eyes:     Pupils: Pupils are equal, round, and reactive to light.  Cardiovascular:     Rate and Rhythm: Normal rate and regular rhythm.     Heart sounds: Normal heart sounds.  Pulmonary:     Effort: Pulmonary effort is normal.     Breath sounds: Normal breath sounds.  Abdominal:     General: Bowel sounds are normal.     Palpations: Abdomen is soft.  Musculoskeletal:        General: No tenderness or deformity. Normal range of motion.     Cervical back: Normal range of motion.  Lymphadenopathy:     Cervical: No cervical adenopathy.  Skin:    General: Skin is warm and dry.     Findings: No erythema or rash.  Neurological:     Mental Status: She is alert and oriented to person, place, and time.  Psychiatric:        Behavior: Behavior normal.        Thought Content: Thought content normal.        Judgment: Judgment normal.      Lab Results  Component Value Date   WBC 6.3 03/01/2020   HGB 11.3 (L) 03/01/2020   HCT 35.5 (L) 03/01/2020   MCV 93.4 03/01/2020   PLT 232 03/01/2020   Lab Results  Component Value Date   FERRITIN 34 02/02/2020   IRON 99 02/02/2020   TIBC 378 02/02/2020   UIBC 278 02/02/2020   IRONPCTSAT 26 02/02/2020   Lab Results  Component Value Date   RETICCTPCT 1.4 03/01/2020   RBC 3.76 (L) 03/01/2020    No results found for: KPAFRELGTCHN, LAMBDASER, KAPLAMBRATIO No results found for: IGGSERUM, IGA, IGMSERUM No results found for: Ronnald Ramp, A1GS, A2GS, Violet Baldy, MSPIKE, SPEI   Chemistry      Component Value Date/Time   NA 140 03/01/2020 1029   K 4.4 03/01/2020 1029   CL 105 03/01/2020 1029   CO2 27 03/01/2020 1029   BUN 17 03/01/2020 1029   CREATININE 1.33 (H) 03/01/2020 1029  Component Value Date/Time   CALCIUM 9.7 03/01/2020 1029   ALKPHOS 51 03/01/2020 1029   AST 16 03/01/2020 1029   ALT 10 03/01/2020 1029   BILITOT 0.5 03/01/2020 1029       Impression and Plan: Ms. Kral is a very pleasant 79 yo female with multifactorial anemia.  I think her main problem has been the fact that her erythropoietin level has been low.  Again she is responding nicely to the Retacrit.  Her hemoglobin is down a little bit today but not low enough that we have to give her a Retacrit dose.  We will see what her iron studies look like also.  We will get her back before Thanksgiving.  If all looks good, then we can get her through the Thanksgiving and Christmas holidays.   Volanda Napoleon, MD 10/13/202111:08 AM

## 2020-03-01 NOTE — Telephone Encounter (Signed)
Appointments scheduled calendar printed per 10/13 los

## 2020-04-03 DIAGNOSIS — R609 Edema, unspecified: Secondary | ICD-10-CM | POA: Diagnosis not present

## 2020-04-06 ENCOUNTER — Inpatient Hospital Stay: Payer: Medicare Other | Admitting: Hematology & Oncology

## 2020-04-06 ENCOUNTER — Telehealth: Payer: Self-pay

## 2020-04-06 ENCOUNTER — Inpatient Hospital Stay: Payer: Medicare Other

## 2020-04-06 NOTE — Telephone Encounter (Signed)
Recd an inbasket 04/06/20 that pt had to cx todays appts, appts r/s to 04/11/20... AOM

## 2020-04-10 DIAGNOSIS — R0989 Other specified symptoms and signs involving the circulatory and respiratory systems: Secondary | ICD-10-CM | POA: Diagnosis not present

## 2020-04-10 DIAGNOSIS — Z7984 Long term (current) use of oral hypoglycemic drugs: Secondary | ICD-10-CM | POA: Diagnosis not present

## 2020-04-10 DIAGNOSIS — I517 Cardiomegaly: Secondary | ICD-10-CM | POA: Diagnosis not present

## 2020-04-10 DIAGNOSIS — Z888 Allergy status to other drugs, medicaments and biological substances status: Secondary | ICD-10-CM | POA: Diagnosis not present

## 2020-04-10 DIAGNOSIS — R531 Weakness: Secondary | ICD-10-CM | POA: Diagnosis not present

## 2020-04-10 DIAGNOSIS — N390 Urinary tract infection, site not specified: Secondary | ICD-10-CM | POA: Diagnosis not present

## 2020-04-10 DIAGNOSIS — I110 Hypertensive heart disease with heart failure: Secondary | ICD-10-CM | POA: Diagnosis not present

## 2020-04-10 DIAGNOSIS — I10 Essential (primary) hypertension: Secondary | ICD-10-CM | POA: Diagnosis not present

## 2020-04-10 DIAGNOSIS — R001 Bradycardia, unspecified: Secondary | ICD-10-CM | POA: Diagnosis not present

## 2020-04-10 DIAGNOSIS — E119 Type 2 diabetes mellitus without complications: Secondary | ICD-10-CM | POA: Diagnosis not present

## 2020-04-10 DIAGNOSIS — I509 Heart failure, unspecified: Secondary | ICD-10-CM | POA: Diagnosis not present

## 2020-04-10 DIAGNOSIS — R197 Diarrhea, unspecified: Secondary | ICD-10-CM | POA: Diagnosis not present

## 2020-04-10 DIAGNOSIS — E876 Hypokalemia: Secondary | ICD-10-CM | POA: Diagnosis not present

## 2020-04-10 DIAGNOSIS — I4891 Unspecified atrial fibrillation: Secondary | ICD-10-CM | POA: Diagnosis not present

## 2020-04-10 DIAGNOSIS — Z79899 Other long term (current) drug therapy: Secondary | ICD-10-CM | POA: Diagnosis not present

## 2020-04-10 DIAGNOSIS — Z880 Allergy status to penicillin: Secondary | ICD-10-CM | POA: Diagnosis not present

## 2020-04-11 ENCOUNTER — Inpatient Hospital Stay: Payer: Medicare Other | Attending: Hematology & Oncology

## 2020-04-11 ENCOUNTER — Inpatient Hospital Stay: Payer: Medicare Other

## 2020-04-11 ENCOUNTER — Inpatient Hospital Stay: Payer: Medicare Other | Admitting: Hematology & Oncology

## 2020-04-20 DIAGNOSIS — Z7984 Long term (current) use of oral hypoglycemic drugs: Secondary | ICD-10-CM | POA: Diagnosis not present

## 2020-04-20 DIAGNOSIS — R7989 Other specified abnormal findings of blood chemistry: Secondary | ICD-10-CM | POA: Diagnosis not present

## 2020-04-20 DIAGNOSIS — Z853 Personal history of malignant neoplasm of breast: Secondary | ICD-10-CM | POA: Diagnosis not present

## 2020-04-20 DIAGNOSIS — Z880 Allergy status to penicillin: Secondary | ICD-10-CM | POA: Diagnosis not present

## 2020-04-20 DIAGNOSIS — E119 Type 2 diabetes mellitus without complications: Secondary | ICD-10-CM | POA: Diagnosis not present

## 2020-04-20 DIAGNOSIS — Z9011 Acquired absence of right breast and nipple: Secondary | ICD-10-CM | POA: Diagnosis not present

## 2020-04-20 DIAGNOSIS — Z7901 Long term (current) use of anticoagulants: Secondary | ICD-10-CM | POA: Diagnosis not present

## 2020-04-20 DIAGNOSIS — R519 Headache, unspecified: Secondary | ICD-10-CM | POA: Diagnosis not present

## 2020-04-20 DIAGNOSIS — I4891 Unspecified atrial fibrillation: Secondary | ICD-10-CM | POA: Diagnosis not present

## 2020-04-20 DIAGNOSIS — R5383 Other fatigue: Secondary | ICD-10-CM | POA: Diagnosis not present

## 2020-04-20 DIAGNOSIS — Z9049 Acquired absence of other specified parts of digestive tract: Secondary | ICD-10-CM | POA: Diagnosis not present

## 2020-04-20 DIAGNOSIS — Z79899 Other long term (current) drug therapy: Secondary | ICD-10-CM | POA: Diagnosis not present

## 2020-04-20 DIAGNOSIS — E854 Organ-limited amyloidosis: Secondary | ICD-10-CM | POA: Diagnosis not present

## 2020-04-20 DIAGNOSIS — I10 Essential (primary) hypertension: Secondary | ICD-10-CM | POA: Diagnosis not present

## 2020-04-20 DIAGNOSIS — Z800 Family history of malignant neoplasm of digestive organs: Secondary | ICD-10-CM | POA: Diagnosis not present

## 2020-04-20 DIAGNOSIS — I6782 Cerebral ischemia: Secondary | ICD-10-CM | POA: Diagnosis not present

## 2020-04-20 DIAGNOSIS — D649 Anemia, unspecified: Secondary | ICD-10-CM | POA: Diagnosis not present

## 2020-04-20 DIAGNOSIS — R29818 Other symptoms and signs involving the nervous system: Secondary | ICD-10-CM | POA: Diagnosis not present

## 2020-04-20 DIAGNOSIS — Z888 Allergy status to other drugs, medicaments and biological substances status: Secondary | ICD-10-CM | POA: Diagnosis not present

## 2020-04-20 DIAGNOSIS — E079 Disorder of thyroid, unspecified: Secondary | ICD-10-CM | POA: Diagnosis not present

## 2020-06-22 DIAGNOSIS — E559 Vitamin D deficiency, unspecified: Secondary | ICD-10-CM | POA: Diagnosis not present

## 2020-06-22 DIAGNOSIS — E039 Hypothyroidism, unspecified: Secondary | ICD-10-CM | POA: Diagnosis not present

## 2020-06-22 DIAGNOSIS — E785 Hyperlipidemia, unspecified: Secondary | ICD-10-CM | POA: Diagnosis not present

## 2020-06-22 DIAGNOSIS — I10 Essential (primary) hypertension: Secondary | ICD-10-CM | POA: Diagnosis not present

## 2020-07-05 DIAGNOSIS — E559 Vitamin D deficiency, unspecified: Secondary | ICD-10-CM | POA: Diagnosis not present

## 2020-07-05 DIAGNOSIS — E119 Type 2 diabetes mellitus without complications: Secondary | ICD-10-CM | POA: Diagnosis not present

## 2020-07-05 DIAGNOSIS — I480 Paroxysmal atrial fibrillation: Secondary | ICD-10-CM | POA: Diagnosis not present

## 2020-07-05 DIAGNOSIS — I10 Essential (primary) hypertension: Secondary | ICD-10-CM | POA: Diagnosis not present

## 2020-07-05 DIAGNOSIS — E785 Hyperlipidemia, unspecified: Secondary | ICD-10-CM | POA: Diagnosis not present

## 2020-07-19 DIAGNOSIS — B078 Other viral warts: Secondary | ICD-10-CM | POA: Diagnosis not present

## 2020-07-19 DIAGNOSIS — C44329 Squamous cell carcinoma of skin of other parts of face: Secondary | ICD-10-CM | POA: Diagnosis not present

## 2020-08-03 DIAGNOSIS — C44329 Squamous cell carcinoma of skin of other parts of face: Secondary | ICD-10-CM | POA: Diagnosis not present

## 2020-09-14 DIAGNOSIS — R600 Localized edema: Secondary | ICD-10-CM | POA: Diagnosis not present

## 2020-10-05 DIAGNOSIS — R600 Localized edema: Secondary | ICD-10-CM | POA: Diagnosis not present

## 2020-10-24 DIAGNOSIS — R7309 Other abnormal glucose: Secondary | ICD-10-CM | POA: Diagnosis not present

## 2020-12-30 ENCOUNTER — Other Ambulatory Visit: Payer: Self-pay | Admitting: Internal Medicine

## 2021-01-01 NOTE — Telephone Encounter (Signed)
Prescription refill request for Xarelto received.   Indication: afib  Last office visit: Allred, 12/28/2019 Weight: 105.2 kg  Age: 80 yo  Scr: 1.14, 06/22/2020 CrCl: 65 ml/min   Pt is overdue for an office visit. Message sent to schedulers.

## 2021-01-02 NOTE — Telephone Encounter (Signed)
Pt has appt scheduled with Dr Rayann Heman for 01/12/21 at 2:30pm.  CrCl 65.37, based on CrCl pt is on appropriate dosage of Xarelto '20mg'$  QD.  Will refill rx.

## 2021-01-12 ENCOUNTER — Other Ambulatory Visit: Payer: Self-pay

## 2021-01-12 ENCOUNTER — Ambulatory Visit: Payer: Medicare Other | Admitting: Internal Medicine

## 2021-01-12 VITALS — BP 136/70 | HR 54 | Ht 66.0 in | Wt 207.2 lb

## 2021-01-12 DIAGNOSIS — R001 Bradycardia, unspecified: Secondary | ICD-10-CM

## 2021-01-12 DIAGNOSIS — I480 Paroxysmal atrial fibrillation: Secondary | ICD-10-CM

## 2021-01-12 DIAGNOSIS — I10 Essential (primary) hypertension: Secondary | ICD-10-CM | POA: Diagnosis not present

## 2021-01-12 NOTE — Patient Instructions (Addendum)
Medication Instructions:  Your physician recommends that you continue on your current medications as directed. Please refer to the Current Medication list given to you today.  Labwork: None ordered.  Testing/Procedures: None ordered.  Follow-Up: Your physician wants you to follow-up in: 12 months with  James Allred, MD or one of the following Advanced Practice Providers on your designated Care Team:    Renee Ursuy, PA-C    You will receive a reminder letter in the mail two months in advance. If you don't receive a letter, please call our office to schedule the follow-up appointment.   Any Other Special Instructions Will Be Listed Below (If Applicable).  If you need a refill on your cardiac medications before your next appointment, please call your pharmacy.        

## 2021-01-12 NOTE — Progress Notes (Signed)
PCP: Jani Gravel, MD   Primary EP: Dr Rayann Heman  Gloria Joseph is a 80 y.o. female who presents today for routine electrophysiology followup.  Since last being seen in our clinic, the patient reports doing very well.  Today, she denies symptoms of palpitations, chest pain, shortness of breath,  lower extremity edema, dizziness, presyncope, or syncope.  The patient is otherwise without complaint today.   Past Medical History:  Diagnosis Date   Anemia 12/18/2011   Atrial fibrillation ([place]) 04/13/2011   Cancer ([place])    breast CA s/p Right mastectomy   Diabetes mellitus, type 2 ([place])    diet controlled   Erythropoietin deficiency anemia 12/06/2019   Hx of echocardiogram    Echocardiogram (12/15): Moderate LVH, EF 55-60%, normal wall motion, grade 1 diastolic dysfunction, aortic sclerosis without stenosis, mild AI, MAC   Hyperlipidemia    Hypertension    medicine refractory   Hypothyroidism    s/p thryroid gland ablation   Neuromuscular disorder (HCC)    NSTEMI (non-ST elevated myocardial infarction) ([place]) 05/13/2018   Osteopenia    Renal insufficiency, mild 09/23/2013   Stroke ([place]) 05/15/2018   Past Surgical History:  Procedure Laterality Date   BREAST ENHANCEMENT SURGERY     BREAST IMPLANT REMOVAL  2000   Unilaterally    BREAST SURGERY     CHOLECYSTECTOMY     COLONOSCOPY  2002   Neg   CORONARY ANGIOGRAPHY N/A 05/15/2018   Procedure: CORONARY ANGIOGRAPHY;  Surgeon: Burnell Blanks, MD;  Location: [facility];  Service: Cardiovascular;  Laterality: N/A;   IR ANGIO INTRA EXTRACRAN SEL COM CAROTID INNOMINATE BILAT MOD SED  05/15/2018   IR ANGIO VERTEBRAL SEL SUBCLAVIAN INNOMINATE UNI R MOD SED  05/15/2018   MASTECTOMY     RADIOLOGY WITH ANESTHESIA N/A 05/15/2018   Procedure: IR WITH ANESTHESIA;  Surgeon: Luanne Bras, MD;  Location: [facility];  Service: Radiology;  Laterality: N/A;   RAI  04/04/2006   TUBAL LIGATION      ROS- all systems are reviewed and  negatives except as per HPI above  Current Outpatient Medications  Medication Sig Dispense Refill   CALCIUM-MAGNESIUM-ZINC PO Take 1 tablet by mouth daily.     carvedilol (COREG) 3.125 MG tablet Take 1 tablet by mouth 2 (two) times daily.     Cholecalciferol (VITAMIN D-3) 1000 UNITS CAPS Take 1 capsule by mouth daily.     citalopram (CELEXA) 20 MG tablet Take 20 mg by mouth daily.     cloNIDine (CATAPRES) 0.2 MG tablet Take 0.2 mg by mouth 2 (two) times daily.     Coenzyme Q10 50 MG CAPS Take 50 mg by mouth daily.     ferrous sulfate 325 (65 FE) MG EC tablet Take 325 mg by mouth daily.     Flaxseed, Linseed, (FLAXSEED OIL PO) Take 1 tablet by mouth daily.     gabapentin (NEURONTIN) 300 MG capsule at bedtime.      glimepiride (AMARYL) 1 MG tablet Take 0.5 mg by mouth every morning.     levothyroxine (SYNTHROID, LEVOTHROID) 112 MCG tablet Take 1 tablet (112 mcg) by mouth daily. EXCEPT Tuesdays and Thursdays take 1/2 tablet (56 mcg) by mouth daily.     losartan (COZAAR) 100 MG tablet Take 100 mg by mouth daily.     metFORMIN (GLUCOPHAGE) 1000 MG tablet Take 1,000 mg by mouth daily.     Multiple Vitamins-Minerals (MULTIVITAMINS) CHEW Chew 1 tablet by mouth daily.  nitroGLYCERIN (NITROSTAT) 0.4 MG SL tablet Place 1 tablet (0.4 mg total) under the tongue every 5 (five) minutes as needed for chest pain. 30 tablet 2   Omega-3 Fatty Acids (FISH OIL) 1000 MG CAPS Take 1 g by mouth daily.     pravastatin (PRAVACHOL) 80 MG tablet Take 80 mg by mouth daily.      rivaroxaban (XARELTO) 20 MG TABS tablet TAKE 1 TABLET (20 MG TOTAL) BY MOUTH DAILY WITH SUPPER. 30 tablet 1   vitamin C (ASCORBIC ACID) 250 MG tablet Take 250 mg by mouth daily.     furosemide (LASIX) 20 MG tablet Take 20 mg by mouth daily.     No current facility-administered medications for this visit.    Physical Exam: Vitals:   01/12/21 1448  BP: 136/70  Pulse: (!) 54  SpO2: 95%  Weight: 207 lb 3.2 oz (94 kg)  Height: _0   (1.676 m)    GEN- The patient is well appearing, alert and oriented x 3 today.   Head- normocephalic, atraumatic Eyes-  Sclera clear, conjunctiva pink Ears- hearing intact Oropharynx- clear Lungs- Clear to ausculation bilaterally, normal work of breathing Heart- Regular rate and rhythm, no murmurs, rubs or gallops, PMI not laterally displaced GI- soft, NT, ND, + BS Extremities- no clubbing, cyanosis, or edema  Wt Readings from Last 3 Encounters:  01/12/21 207 lb 3.2 oz (94 kg)  03/01/20 232 lb (105.2 kg)  02/02/20 236 lb 1.3 oz (107.1 kg)    EKG tracing ordered today is personally reviewed and shows sinus bradycardia, first degree AV block  Assessment and Plan:  Paroxysmal atrial fibrillation Maintaining sinus She is on xarelto  2. HTN Stable No change required today Continue cozaar 114m daily  3. Overweight Body mass index is 33.44 kg/m. Lifestyle modification is advised  4. Venous insufficiency Stable No change required today    Risks, benefits and potential toxicities for medications prescribed and/or refilled reviewed with patient today.   Return to see EP APP annually  JThompson GrayerMD, FFairview Regional Medical Center8/26/2022 2:54 PM

## 2021-03-29 ENCOUNTER — Other Ambulatory Visit: Payer: Self-pay | Admitting: Internal Medicine

## 2021-03-29 DIAGNOSIS — I4891 Unspecified atrial fibrillation: Secondary | ICD-10-CM

## 2021-03-29 DIAGNOSIS — Z5181 Encounter for therapeutic drug level monitoring: Secondary | ICD-10-CM
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
## 2021-04-02 ENCOUNTER — Other Ambulatory Visit: Payer: Self-pay

## 2021-04-02 ENCOUNTER — Other Ambulatory Visit: Payer: Medicare Other

## 2021-04-02 DIAGNOSIS — I4891 Unspecified atrial fibrillation: Secondary | ICD-10-CM | POA: Diagnosis not present

## 2021-04-02 DIAGNOSIS — Z5181 Encounter for therapeutic drug level monitoring: Secondary | ICD-10-CM | POA: Diagnosis not present

## 2021-04-05 LAB — CBC
Hematocrit: 33.3 % — ABNORMAL LOW (ref 34.0–46.6)
Hemoglobin: 11 g/dL — ABNORMAL LOW (ref 11.1–15.9)
MCH: 31 pg (ref 26.6–33.0)
MCHC: 33 g/dL (ref 31.5–35.7)
MCV: 94 fL (ref 79–97)
Platelets: 267 10*3/uL (ref 150–450)
RBC: 3.55 x10E6/uL — ABNORMAL LOW (ref 3.77–5.28)
RDW: 13 % (ref 11.7–15.4)
WBC: 5 10*3/uL (ref 3.4–10.8)

## 2021-04-05 LAB — BASIC METABOLIC PANEL
BUN/Creatinine Ratio: 13 (ref 12–28)
BUN: 16 mg/dL (ref 8–27)
CO2: 23 mmol/L (ref 20–29)
Calcium: 9.4 mg/dL (ref 8.7–10.3)
Chloride: 99 mmol/L (ref 96–106)
Creatinine, Ser: 1.28 mg/dL — ABNORMAL HIGH (ref 0.57–1.00)
Glucose: 122 mg/dL — ABNORMAL HIGH (ref 70–99)
Potassium: 4.5 mmol/L (ref 3.5–5.2)
Sodium: 139 mmol/L (ref 134–144)
eGFR: 42 mL/min/{1.73_m2} — ABNORMAL LOW (ref >59)

## 2021-05-03 DIAGNOSIS — I480 Paroxysmal atrial fibrillation: Secondary | ICD-10-CM | POA: Diagnosis not present

## 2021-05-03 DIAGNOSIS — E785 Hyperlipidemia, unspecified: Secondary | ICD-10-CM | POA: Diagnosis not present

## 2021-05-03 DIAGNOSIS — E039 Hypothyroidism, unspecified: Secondary | ICD-10-CM | POA: Diagnosis not present

## 2021-05-03 DIAGNOSIS — I10 Essential (primary) hypertension: Secondary | ICD-10-CM | POA: Diagnosis not present

## 2021-05-03 DIAGNOSIS — E559 Vitamin D deficiency, unspecified: Secondary | ICD-10-CM | POA: Diagnosis not present

## 2021-05-03 DIAGNOSIS — E119 Type 2 diabetes mellitus without complications: Secondary | ICD-10-CM | POA: Diagnosis not present

## 2021-05-03 DIAGNOSIS — D509 Iron deficiency anemia, unspecified: Secondary | ICD-10-CM | POA: Diagnosis not present

## 2021-05-08 DIAGNOSIS — D509 Iron deficiency anemia, unspecified: Secondary | ICD-10-CM | POA: Diagnosis not present

## 2021-05-08 DIAGNOSIS — E785 Hyperlipidemia, unspecified: Secondary | ICD-10-CM | POA: Diagnosis not present

## 2021-05-08 DIAGNOSIS — Z23 Encounter for immunization: Secondary | ICD-10-CM | POA: Diagnosis not present

## 2021-05-08 DIAGNOSIS — E039 Hypothyroidism, unspecified: Secondary | ICD-10-CM | POA: Diagnosis not present

## 2021-05-08 DIAGNOSIS — E559 Vitamin D deficiency, unspecified: Secondary | ICD-10-CM | POA: Diagnosis not present

## 2021-05-08 DIAGNOSIS — E119 Type 2 diabetes mellitus without complications: Secondary | ICD-10-CM | POA: Diagnosis not present

## 2021-07-03 ENCOUNTER — Other Ambulatory Visit: Payer: Self-pay | Admitting: Internal Medicine

## 2021-07-03 DIAGNOSIS — I480 Paroxysmal atrial fibrillation: Secondary | ICD-10-CM

## 2021-07-04 NOTE — Telephone Encounter (Signed)
Xarelto 20mg  refill request received. Pt is 81 years old, weight-94kg, Crea-1.28 on 04/02/2021, last seen by Dr. Rayann Heman on 01/12/2021, Diagnosis-Afib, CrCl-51.100ml/min; Dose is appropriate based on dosing criteria. Will send in refill to requested pharmacy.

## 2022-01-19 ENCOUNTER — Other Ambulatory Visit: Payer: Self-pay | Admitting: Internal Medicine

## 2022-01-19 DIAGNOSIS — I480 Paroxysmal atrial fibrillation: Secondary | ICD-10-CM

## 2022-01-22 NOTE — Telephone Encounter (Signed)
Pt saw Dr Jac Canavan at [facility] on 12/06/21.  Pt has not seen one of our cardiologists in over 1 year.  Will deny rx at pharmacy with note to forward refill request to Dr Jac Canavan pt's new cardiologist.

## 2022-03-26 DIAGNOSIS — E782 Mixed hyperlipidemia: Secondary | ICD-10-CM | POA: Diagnosis not present

## 2022-03-26 DIAGNOSIS — Z8673 Personal history of transient ischemic attack (TIA), and cerebral infarction without residual deficits: Secondary | ICD-10-CM | POA: Diagnosis not present

## 2022-03-26 DIAGNOSIS — I10 Essential (primary) hypertension: Secondary | ICD-10-CM | POA: Diagnosis not present

## 2022-03-26 DIAGNOSIS — N183 Chronic kidney disease, stage 3 unspecified: Secondary | ICD-10-CM | POA: Diagnosis not present

## 2022-03-26 DIAGNOSIS — Z7901 Long term (current) use of anticoagulants: Secondary | ICD-10-CM | POA: Diagnosis not present

## 2022-03-26 DIAGNOSIS — I480 Paroxysmal atrial fibrillation: Secondary | ICD-10-CM | POA: Diagnosis not present

## 2022-03-26 DIAGNOSIS — I5181 Takotsubo syndrome: Secondary | ICD-10-CM | POA: Diagnosis not present

## 2022-04-22 DIAGNOSIS — I480 Paroxysmal atrial fibrillation: Secondary | ICD-10-CM | POA: Diagnosis not present

## 2022-04-22 DIAGNOSIS — N183 Chronic kidney disease, stage 3 unspecified: Secondary | ICD-10-CM | POA: Diagnosis not present

## 2022-04-23 DIAGNOSIS — I5181 Takotsubo syndrome: Secondary | ICD-10-CM | POA: Diagnosis not present

## 2022-04-23 DIAGNOSIS — Z8673 Personal history of transient ischemic attack (TIA), and cerebral infarction without residual deficits: Secondary | ICD-10-CM | POA: Diagnosis not present

## 2022-04-23 DIAGNOSIS — I480 Paroxysmal atrial fibrillation: Secondary | ICD-10-CM | POA: Diagnosis not present

## 2022-04-23 DIAGNOSIS — R001 Bradycardia, unspecified: Secondary | ICD-10-CM | POA: Diagnosis not present

## 2022-04-23 DIAGNOSIS — I10 Essential (primary) hypertension: Secondary | ICD-10-CM | POA: Diagnosis not present

## 2022-04-29 DIAGNOSIS — I5181 Takotsubo syndrome: Secondary | ICD-10-CM | POA: Diagnosis not present

## 2022-04-29 DIAGNOSIS — I480 Paroxysmal atrial fibrillation: Secondary | ICD-10-CM | POA: Diagnosis not present

## 2022-05-03 DIAGNOSIS — R001 Bradycardia, unspecified: Secondary | ICD-10-CM | POA: Diagnosis not present

## 2022-05-03 DIAGNOSIS — Z8673 Personal history of transient ischemic attack (TIA), and cerebral infarction without residual deficits: Secondary | ICD-10-CM | POA: Diagnosis not present

## 2022-05-03 DIAGNOSIS — Z7901 Long term (current) use of anticoagulants: Secondary | ICD-10-CM | POA: Diagnosis not present

## 2022-05-03 DIAGNOSIS — I480 Paroxysmal atrial fibrillation: Secondary | ICD-10-CM | POA: Diagnosis not present

## 2022-05-03 DIAGNOSIS — N183 Chronic kidney disease, stage 3 unspecified: Secondary | ICD-10-CM | POA: Diagnosis not present

## 2022-05-03 DIAGNOSIS — I5181 Takotsubo syndrome: Secondary | ICD-10-CM | POA: Diagnosis not present

## 2022-05-03 DIAGNOSIS — E782 Mixed hyperlipidemia: Secondary | ICD-10-CM | POA: Diagnosis not present

## 2022-05-03 DIAGNOSIS — I10 Essential (primary) hypertension: Secondary | ICD-10-CM | POA: Diagnosis not present

## 2022-05-16 DIAGNOSIS — M7062 Trochanteric bursitis, left hip: Secondary | ICD-10-CM | POA: Diagnosis not present

## 2022-05-16 DIAGNOSIS — M7061 Trochanteric bursitis, right hip: Secondary | ICD-10-CM | POA: Diagnosis not present

## 2022-05-16 DIAGNOSIS — M5451 Vertebrogenic low back pain: Secondary | ICD-10-CM | POA: Diagnosis not present

## 2022-05-17 DIAGNOSIS — J309 Allergic rhinitis, unspecified: Secondary | ICD-10-CM | POA: Diagnosis not present

## 2022-05-17 DIAGNOSIS — J4520 Mild intermittent asthma, uncomplicated: Secondary | ICD-10-CM | POA: Diagnosis not present

## 2022-06-06 DIAGNOSIS — Z7984 Long term (current) use of oral hypoglycemic drugs: Secondary | ICD-10-CM | POA: Diagnosis not present

## 2022-06-06 DIAGNOSIS — J849 Interstitial pulmonary disease, unspecified: Secondary | ICD-10-CM | POA: Diagnosis not present

## 2022-06-06 DIAGNOSIS — Z888 Allergy status to other drugs, medicaments and biological substances status: Secondary | ICD-10-CM | POA: Diagnosis not present

## 2022-06-06 DIAGNOSIS — M7502 Adhesive capsulitis of left shoulder: Secondary | ICD-10-CM | POA: Diagnosis not present

## 2022-06-06 DIAGNOSIS — I480 Paroxysmal atrial fibrillation: Secondary | ICD-10-CM | POA: Diagnosis not present

## 2022-06-06 DIAGNOSIS — I5181 Takotsubo syndrome: Secondary | ICD-10-CM | POA: Diagnosis not present

## 2022-06-06 DIAGNOSIS — E1122 Type 2 diabetes mellitus with diabetic chronic kidney disease: Secondary | ICD-10-CM | POA: Diagnosis not present

## 2022-06-06 DIAGNOSIS — E039 Hypothyroidism, unspecified: Secondary | ICD-10-CM | POA: Diagnosis not present

## 2022-06-06 DIAGNOSIS — Z950 Presence of cardiac pacemaker: Secondary | ICD-10-CM | POA: Diagnosis not present

## 2022-06-06 DIAGNOSIS — Z79899 Other long term (current) drug therapy: Secondary | ICD-10-CM | POA: Diagnosis not present

## 2022-06-06 DIAGNOSIS — E782 Mixed hyperlipidemia: Secondary | ICD-10-CM | POA: Diagnosis not present

## 2022-06-06 DIAGNOSIS — I495 Sick sinus syndrome: Secondary | ICD-10-CM | POA: Diagnosis not present

## 2022-06-06 DIAGNOSIS — Z880 Allergy status to penicillin: Secondary | ICD-10-CM | POA: Diagnosis not present

## 2022-06-06 DIAGNOSIS — I129 Hypertensive chronic kidney disease with stage 1 through stage 4 chronic kidney disease, or unspecified chronic kidney disease: Secondary | ICD-10-CM | POA: Diagnosis not present

## 2022-06-06 DIAGNOSIS — R001 Bradycardia, unspecified: Secondary | ICD-10-CM | POA: Diagnosis not present

## 2022-06-06 DIAGNOSIS — N183 Chronic kidney disease, stage 3 unspecified: Secondary | ICD-10-CM | POA: Diagnosis not present

## 2022-06-06 DIAGNOSIS — Z8673 Personal history of transient ischemic attack (TIA), and cerebral infarction without residual deficits: Secondary | ICD-10-CM | POA: Diagnosis not present

## 2022-06-06 DIAGNOSIS — E1142 Type 2 diabetes mellitus with diabetic polyneuropathy: Secondary | ICD-10-CM | POA: Diagnosis not present

## 2022-06-20 DIAGNOSIS — M7062 Trochanteric bursitis, left hip: Secondary | ICD-10-CM | POA: Diagnosis not present

## 2022-06-20 DIAGNOSIS — M5451 Vertebrogenic low back pain: Secondary | ICD-10-CM | POA: Diagnosis not present

## 2022-06-20 DIAGNOSIS — M25552 Pain in left hip: Secondary | ICD-10-CM | POA: Diagnosis not present

## 2022-07-19 DIAGNOSIS — M79641 Pain in right hand: Secondary | ICD-10-CM | POA: Diagnosis not present

## 2022-07-19 DIAGNOSIS — R918 Other nonspecific abnormal finding of lung field: Secondary | ICD-10-CM | POA: Diagnosis not present

## 2022-07-19 DIAGNOSIS — S0031XA Abrasion of nose, initial encounter: Secondary | ICD-10-CM | POA: Diagnosis not present

## 2022-07-19 DIAGNOSIS — E871 Hypo-osmolality and hyponatremia: Secondary | ICD-10-CM | POA: Diagnosis not present

## 2022-07-19 DIAGNOSIS — M1811 Unilateral primary osteoarthritis of first carpometacarpal joint, right hand: Secondary | ICD-10-CM | POA: Diagnosis not present

## 2022-07-19 DIAGNOSIS — J3489 Other specified disorders of nose and nasal sinuses: Secondary | ICD-10-CM | POA: Diagnosis not present

## 2022-07-19 DIAGNOSIS — K029 Dental caries, unspecified: Secondary | ICD-10-CM | POA: Diagnosis not present

## 2022-07-19 DIAGNOSIS — S0003XA Contusion of scalp, initial encounter: Secondary | ICD-10-CM | POA: Diagnosis not present

## 2022-07-19 DIAGNOSIS — R296 Repeated falls: Secondary | ICD-10-CM | POA: Diagnosis not present

## 2022-07-19 DIAGNOSIS — W01198A Fall on same level from slipping, tripping and stumbling with subsequent striking against other object, initial encounter: Secondary | ICD-10-CM | POA: Diagnosis not present

## 2022-07-19 DIAGNOSIS — R944 Abnormal results of kidney function studies: Secondary | ICD-10-CM | POA: Diagnosis not present

## 2022-07-19 DIAGNOSIS — Y998 Other external cause status: Secondary | ICD-10-CM | POA: Diagnosis not present

## 2022-07-19 DIAGNOSIS — M19031 Primary osteoarthritis, right wrist: Secondary | ICD-10-CM | POA: Diagnosis not present

## 2022-07-19 DIAGNOSIS — I517 Cardiomegaly: Secondary | ICD-10-CM | POA: Diagnosis not present

## 2022-07-19 DIAGNOSIS — I501 Left ventricular failure: Secondary | ICD-10-CM | POA: Diagnosis not present

## 2022-07-30 DIAGNOSIS — E119 Type 2 diabetes mellitus without complications: Secondary | ICD-10-CM | POA: Diagnosis not present

## 2022-07-30 DIAGNOSIS — E039 Hypothyroidism, unspecified: Secondary | ICD-10-CM | POA: Diagnosis not present

## 2022-07-30 DIAGNOSIS — D509 Iron deficiency anemia, unspecified: Secondary | ICD-10-CM | POA: Diagnosis not present

## 2022-07-30 DIAGNOSIS — E559 Vitamin D deficiency, unspecified: Secondary | ICD-10-CM | POA: Diagnosis not present

## 2022-08-06 DIAGNOSIS — E039 Hypothyroidism, unspecified: Secondary | ICD-10-CM | POA: Diagnosis not present

## 2022-08-06 DIAGNOSIS — R7989 Other specified abnormal findings of blood chemistry: Secondary | ICD-10-CM | POA: Diagnosis not present

## 2022-08-06 DIAGNOSIS — I10 Essential (primary) hypertension: Secondary | ICD-10-CM | POA: Diagnosis not present

## 2022-08-06 DIAGNOSIS — E559 Vitamin D deficiency, unspecified: Secondary | ICD-10-CM | POA: Diagnosis not present

## 2022-08-06 DIAGNOSIS — D509 Iron deficiency anemia, unspecified: Secondary | ICD-10-CM | POA: Diagnosis not present

## 2022-08-06 DIAGNOSIS — E785 Hyperlipidemia, unspecified: Secondary | ICD-10-CM | POA: Diagnosis not present

## 2022-08-06 DIAGNOSIS — E1165 Type 2 diabetes mellitus with hyperglycemia: Secondary | ICD-10-CM | POA: Diagnosis not present

## 2022-08-06 DIAGNOSIS — I480 Paroxysmal atrial fibrillation: Secondary | ICD-10-CM | POA: Diagnosis not present

## 2022-08-06 DIAGNOSIS — Z950 Presence of cardiac pacemaker: Secondary | ICD-10-CM | POA: Diagnosis not present

## 2022-08-14 DIAGNOSIS — E782 Mixed hyperlipidemia: Secondary | ICD-10-CM | POA: Diagnosis not present

## 2022-08-14 DIAGNOSIS — I5181 Takotsubo syndrome: Secondary | ICD-10-CM | POA: Diagnosis not present

## 2022-08-14 DIAGNOSIS — I10 Essential (primary) hypertension: Secondary | ICD-10-CM | POA: Diagnosis not present

## 2022-08-14 DIAGNOSIS — I480 Paroxysmal atrial fibrillation: Secondary | ICD-10-CM | POA: Diagnosis not present

## 2022-08-14 DIAGNOSIS — I2510 Atherosclerotic heart disease of native coronary artery without angina pectoris: Secondary | ICD-10-CM | POA: Diagnosis not present

## 2022-08-29 DIAGNOSIS — I480 Paroxysmal atrial fibrillation: Secondary | ICD-10-CM | POA: Diagnosis not present

## 2022-09-10 DIAGNOSIS — I5181 Takotsubo syndrome: Secondary | ICD-10-CM | POA: Diagnosis not present

## 2022-09-10 DIAGNOSIS — I10 Essential (primary) hypertension: Secondary | ICD-10-CM | POA: Diagnosis not present

## 2022-09-10 DIAGNOSIS — N183 Chronic kidney disease, stage 3 unspecified: Secondary | ICD-10-CM | POA: Diagnosis not present

## 2022-09-13 DIAGNOSIS — N183 Chronic kidney disease, stage 3 unspecified: Secondary | ICD-10-CM | POA: Diagnosis not present

## 2022-09-13 DIAGNOSIS — E782 Mixed hyperlipidemia: Secondary | ICD-10-CM | POA: Diagnosis not present

## 2022-09-13 DIAGNOSIS — I5181 Takotsubo syndrome: Secondary | ICD-10-CM | POA: Diagnosis not present

## 2022-09-13 DIAGNOSIS — I480 Paroxysmal atrial fibrillation: Secondary | ICD-10-CM | POA: Diagnosis not present

## 2022-09-13 DIAGNOSIS — Z4889 Encounter for other specified surgical aftercare: Secondary | ICD-10-CM | POA: Diagnosis not present

## 2022-09-13 DIAGNOSIS — Z7901 Long term (current) use of anticoagulants: Secondary | ICD-10-CM | POA: Diagnosis not present

## 2022-09-13 DIAGNOSIS — I10 Essential (primary) hypertension: Secondary | ICD-10-CM | POA: Diagnosis not present

## 2022-09-13 DIAGNOSIS — R001 Bradycardia, unspecified: Secondary | ICD-10-CM | POA: Diagnosis not present

## 2022-09-13 DIAGNOSIS — I2510 Atherosclerotic heart disease of native coronary artery without angina pectoris: Secondary | ICD-10-CM | POA: Diagnosis not present

## 2022-10-27 DIAGNOSIS — I480 Paroxysmal atrial fibrillation: Secondary | ICD-10-CM | POA: Diagnosis not present

## 2022-11-20 DIAGNOSIS — D509 Iron deficiency anemia, unspecified: Secondary | ICD-10-CM | POA: Diagnosis not present

## 2022-11-20 DIAGNOSIS — N1832 Chronic kidney disease, stage 3b: Secondary | ICD-10-CM | POA: Diagnosis not present

## 2022-11-20 DIAGNOSIS — Z950 Presence of cardiac pacemaker: Secondary | ICD-10-CM | POA: Diagnosis not present

## 2022-11-20 DIAGNOSIS — N3281 Overactive bladder: Secondary | ICD-10-CM | POA: Diagnosis not present

## 2022-11-20 DIAGNOSIS — E039 Hypothyroidism, unspecified: Secondary | ICD-10-CM | POA: Diagnosis not present

## 2022-11-20 DIAGNOSIS — E1165 Type 2 diabetes mellitus with hyperglycemia: Secondary | ICD-10-CM | POA: Diagnosis not present

## 2022-12-19 DIAGNOSIS — D509 Iron deficiency anemia, unspecified: Secondary | ICD-10-CM | POA: Diagnosis not present

## 2022-12-19 DIAGNOSIS — E1165 Type 2 diabetes mellitus with hyperglycemia: Secondary | ICD-10-CM | POA: Diagnosis not present

## 2022-12-19 DIAGNOSIS — N3000 Acute cystitis without hematuria: Secondary | ICD-10-CM | POA: Diagnosis not present

## 2022-12-19 DIAGNOSIS — E039 Hypothyroidism, unspecified: Secondary | ICD-10-CM | POA: Diagnosis not present

## 2022-12-30 DIAGNOSIS — E039 Hypothyroidism, unspecified: Secondary | ICD-10-CM | POA: Diagnosis not present

## 2022-12-30 DIAGNOSIS — D509 Iron deficiency anemia, unspecified: Secondary | ICD-10-CM | POA: Diagnosis not present

## 2023-01-02 DIAGNOSIS — I10 Essential (primary) hypertension: Secondary | ICD-10-CM | POA: Diagnosis not present

## 2023-01-02 DIAGNOSIS — N1832 Chronic kidney disease, stage 3b: Secondary | ICD-10-CM | POA: Diagnosis not present

## 2023-01-02 DIAGNOSIS — E1165 Type 2 diabetes mellitus with hyperglycemia: Secondary | ICD-10-CM | POA: Diagnosis not present

## 2023-01-02 DIAGNOSIS — D509 Iron deficiency anemia, unspecified: Secondary | ICD-10-CM | POA: Diagnosis not present

## 2023-01-02 DIAGNOSIS — E039 Hypothyroidism, unspecified: Secondary | ICD-10-CM | POA: Diagnosis not present

## 2023-01-07 DIAGNOSIS — E039 Hypothyroidism, unspecified: Secondary | ICD-10-CM | POA: Diagnosis not present

## 2023-01-26 DIAGNOSIS — I4891 Unspecified atrial fibrillation: Secondary | ICD-10-CM | POA: Diagnosis not present

## 2023-02-07 DIAGNOSIS — I10 Essential (primary) hypertension: Secondary | ICD-10-CM | POA: Diagnosis not present

## 2023-02-07 DIAGNOSIS — E039 Hypothyroidism, unspecified: Secondary | ICD-10-CM | POA: Diagnosis not present

## 2023-02-11 DIAGNOSIS — E039 Hypothyroidism, unspecified: Secondary | ICD-10-CM | POA: Diagnosis not present

## 2023-02-25 DIAGNOSIS — I5181 Takotsubo syndrome: Secondary | ICD-10-CM | POA: Diagnosis not present

## 2023-02-25 DIAGNOSIS — E782 Mixed hyperlipidemia: Secondary | ICD-10-CM | POA: Diagnosis not present

## 2023-02-25 DIAGNOSIS — I10 Essential (primary) hypertension: Secondary | ICD-10-CM | POA: Diagnosis not present

## 2023-02-25 DIAGNOSIS — I2510 Atherosclerotic heart disease of native coronary artery without angina pectoris: Secondary | ICD-10-CM | POA: Diagnosis not present

## 2023-02-25 DIAGNOSIS — I480 Paroxysmal atrial fibrillation: Secondary | ICD-10-CM | POA: Diagnosis not present

## 2023-02-25 DIAGNOSIS — Z7901 Long term (current) use of anticoagulants: Secondary | ICD-10-CM | POA: Diagnosis not present

## 2023-02-27 DIAGNOSIS — N1832 Chronic kidney disease, stage 3b: Secondary | ICD-10-CM | POA: Diagnosis not present

## 2023-02-27 DIAGNOSIS — E1165 Type 2 diabetes mellitus with hyperglycemia: Secondary | ICD-10-CM | POA: Diagnosis not present

## 2023-02-27 DIAGNOSIS — Z0000 Encounter for general adult medical examination without abnormal findings: Secondary | ICD-10-CM | POA: Diagnosis not present

## 2023-02-27 DIAGNOSIS — E785 Hyperlipidemia, unspecified: Secondary | ICD-10-CM | POA: Diagnosis not present

## 2023-02-27 DIAGNOSIS — E559 Vitamin D deficiency, unspecified: Secondary | ICD-10-CM | POA: Diagnosis not present

## 2023-02-27 DIAGNOSIS — I480 Paroxysmal atrial fibrillation: Secondary | ICD-10-CM | POA: Diagnosis not present

## 2023-02-27 DIAGNOSIS — I10 Essential (primary) hypertension: Secondary | ICD-10-CM | POA: Diagnosis not present

## 2023-02-27 DIAGNOSIS — E039 Hypothyroidism, unspecified: Secondary | ICD-10-CM | POA: Diagnosis not present

## 2023-03-14 DIAGNOSIS — I480 Paroxysmal atrial fibrillation: Secondary | ICD-10-CM | POA: Diagnosis not present

## 2023-03-14 DIAGNOSIS — I2510 Atherosclerotic heart disease of native coronary artery without angina pectoris: Secondary | ICD-10-CM | POA: Diagnosis not present

## 2023-03-14 DIAGNOSIS — I10 Essential (primary) hypertension: Secondary | ICD-10-CM | POA: Diagnosis not present

## 2023-03-14 DIAGNOSIS — I5181 Takotsubo syndrome: Secondary | ICD-10-CM | POA: Diagnosis not present

## 2023-03-14 DIAGNOSIS — R001 Bradycardia, unspecified: Secondary | ICD-10-CM | POA: Diagnosis not present

## 2023-03-14 DIAGNOSIS — J45909 Unspecified asthma, uncomplicated: Secondary | ICD-10-CM | POA: Diagnosis not present

## 2023-03-14 DIAGNOSIS — Z8673 Personal history of transient ischemic attack (TIA), and cerebral infarction without residual deficits: Secondary | ICD-10-CM | POA: Diagnosis not present

## 2023-03-14 DIAGNOSIS — Z4889 Encounter for other specified surgical aftercare: Secondary | ICD-10-CM | POA: Diagnosis not present

## 2023-03-14 DIAGNOSIS — E782 Mixed hyperlipidemia: Secondary | ICD-10-CM | POA: Diagnosis not present

## 2023-07-14 DIAGNOSIS — E039 Hypothyroidism, unspecified: Secondary | ICD-10-CM | POA: Diagnosis not present

## 2023-07-14 DIAGNOSIS — E1165 Type 2 diabetes mellitus with hyperglycemia: Secondary | ICD-10-CM | POA: Diagnosis not present

## 2023-07-16 DIAGNOSIS — E875 Hyperkalemia: Secondary | ICD-10-CM | POA: Diagnosis not present

## 2023-07-16 DIAGNOSIS — R600 Localized edema: Secondary | ICD-10-CM | POA: Diagnosis not present

## 2023-07-18 DIAGNOSIS — R600 Localized edema: Secondary | ICD-10-CM | POA: Diagnosis not present

## 2023-07-18 DIAGNOSIS — E875 Hyperkalemia: Secondary | ICD-10-CM | POA: Diagnosis not present

## 2023-07-23 DIAGNOSIS — E875 Hyperkalemia: Secondary | ICD-10-CM | POA: Diagnosis not present

## 2023-07-23 DIAGNOSIS — R600 Localized edema: Secondary | ICD-10-CM | POA: Diagnosis not present

## 2023-08-03 DIAGNOSIS — Z950 Presence of cardiac pacemaker: Secondary | ICD-10-CM | POA: Diagnosis not present

## 2023-08-03 DIAGNOSIS — I4819 Other persistent atrial fibrillation: Secondary | ICD-10-CM | POA: Diagnosis not present

## 2023-08-04 DIAGNOSIS — I480 Paroxysmal atrial fibrillation: Secondary | ICD-10-CM | POA: Diagnosis not present

## 2023-08-05 DIAGNOSIS — E039 Hypothyroidism, unspecified: Secondary | ICD-10-CM | POA: Diagnosis not present

## 2023-08-12 DIAGNOSIS — E039 Hypothyroidism, unspecified: Secondary | ICD-10-CM | POA: Diagnosis not present

## 2023-08-12 DIAGNOSIS — I480 Paroxysmal atrial fibrillation: Secondary | ICD-10-CM | POA: Diagnosis not present

## 2023-08-21 DIAGNOSIS — E782 Mixed hyperlipidemia: Secondary | ICD-10-CM | POA: Diagnosis not present

## 2023-08-21 DIAGNOSIS — I5181 Takotsubo syndrome: Secondary | ICD-10-CM | POA: Diagnosis not present

## 2023-08-21 DIAGNOSIS — I2510 Atherosclerotic heart disease of native coronary artery without angina pectoris: Secondary | ICD-10-CM | POA: Diagnosis not present

## 2023-08-21 DIAGNOSIS — I480 Paroxysmal atrial fibrillation: Secondary | ICD-10-CM | POA: Diagnosis not present

## 2023-08-21 DIAGNOSIS — I10 Essential (primary) hypertension: Secondary | ICD-10-CM | POA: Diagnosis not present

## 2023-10-10 DIAGNOSIS — I5181 Takotsubo syndrome: Secondary | ICD-10-CM | POA: Diagnosis not present

## 2023-10-10 DIAGNOSIS — I480 Paroxysmal atrial fibrillation: Secondary | ICD-10-CM | POA: Diagnosis not present

## 2023-10-10 DIAGNOSIS — I10 Essential (primary) hypertension: Secondary | ICD-10-CM | POA: Diagnosis not present

## 2023-10-10 DIAGNOSIS — I2510 Atherosclerotic heart disease of native coronary artery without angina pectoris: Secondary | ICD-10-CM | POA: Diagnosis not present

## 2023-10-10 DIAGNOSIS — E782 Mixed hyperlipidemia: Secondary | ICD-10-CM | POA: Diagnosis not present

## 2023-10-10 DIAGNOSIS — Z4889 Encounter for other specified surgical aftercare: Secondary | ICD-10-CM | POA: Diagnosis not present

## 2023-10-10 DIAGNOSIS — I4819 Other persistent atrial fibrillation: Secondary | ICD-10-CM | POA: Diagnosis not present

## 2023-10-10 DIAGNOSIS — Z950 Presence of cardiac pacemaker: Secondary | ICD-10-CM | POA: Diagnosis not present

## 2023-12-15 ENCOUNTER — Other Ambulatory Visit: Payer: Self-pay | Admitting: Registered Nurse

## 2023-12-15 DIAGNOSIS — E039 Hypothyroidism, unspecified: Secondary | ICD-10-CM | POA: Diagnosis not present

## 2023-12-15 DIAGNOSIS — R5381 Other malaise: Secondary | ICD-10-CM | POA: Diagnosis not present

## 2023-12-15 DIAGNOSIS — M79604 Pain in right leg: Secondary | ICD-10-CM | POA: Diagnosis not present

## 2023-12-15 DIAGNOSIS — M79605 Pain in left leg: Secondary | ICD-10-CM

## 2023-12-15 DIAGNOSIS — E1165 Type 2 diabetes mellitus with hyperglycemia: Secondary | ICD-10-CM | POA: Diagnosis not present

## 2023-12-15 DIAGNOSIS — D509 Iron deficiency anemia, unspecified: Secondary | ICD-10-CM | POA: Diagnosis not present

## 2023-12-18 ENCOUNTER — Inpatient Hospital Stay
Admission: RE | Admit: 2023-12-18 | Discharge: 2023-12-18 | Discharge status: Home / Self Care | Source: Ambulatory Visit | Attending: Registered Nurse | Admitting: Registered Nurse

## 2023-12-18 DIAGNOSIS — M79604 Pain in right leg: Secondary | ICD-10-CM

## 2023-12-18 DIAGNOSIS — M79605 Pain in left leg: Secondary | ICD-10-CM | POA: Diagnosis not present

## 2023-12-29 DIAGNOSIS — R5381 Other malaise: Secondary | ICD-10-CM | POA: Diagnosis not present

## 2023-12-29 DIAGNOSIS — D509 Iron deficiency anemia, unspecified: Secondary | ICD-10-CM | POA: Diagnosis not present

## 2023-12-29 DIAGNOSIS — M79604 Pain in right leg: Secondary | ICD-10-CM | POA: Diagnosis not present

## 2023-12-29 DIAGNOSIS — M79605 Pain in left leg: Secondary | ICD-10-CM | POA: Diagnosis not present

## 2023-12-30 DIAGNOSIS — M25551 Pain in right hip: Secondary | ICD-10-CM | POA: Diagnosis not present

## 2023-12-30 DIAGNOSIS — M5451 Vertebrogenic low back pain: Secondary | ICD-10-CM | POA: Diagnosis not present

## 2023-12-30 DIAGNOSIS — M25552 Pain in left hip: Secondary | ICD-10-CM | POA: Diagnosis not present

## 2024-01-26 DIAGNOSIS — Z9181 History of falling: Secondary | ICD-10-CM | POA: Diagnosis not present

## 2024-01-26 DIAGNOSIS — M79604 Pain in right leg: Secondary | ICD-10-CM | POA: Diagnosis not present

## 2024-01-26 DIAGNOSIS — R5381 Other malaise: Secondary | ICD-10-CM | POA: Diagnosis not present

## 2024-01-26 DIAGNOSIS — R293 Abnormal posture: Secondary | ICD-10-CM | POA: Diagnosis not present

## 2024-01-26 DIAGNOSIS — Z7409 Other reduced mobility: Secondary | ICD-10-CM | POA: Diagnosis not present

## 2024-01-26 DIAGNOSIS — M79605 Pain in left leg: Secondary | ICD-10-CM | POA: Diagnosis not present

## 2024-01-30 DIAGNOSIS — Z950 Presence of cardiac pacemaker: Secondary | ICD-10-CM | POA: Diagnosis not present

## 2024-01-30 DIAGNOSIS — I480 Paroxysmal atrial fibrillation: Secondary | ICD-10-CM | POA: Diagnosis not present

## 2024-02-12 DIAGNOSIS — E039 Hypothyroidism, unspecified: Secondary | ICD-10-CM | POA: Diagnosis not present

## 2024-02-19 DIAGNOSIS — E785 Hyperlipidemia, unspecified: Secondary | ICD-10-CM | POA: Diagnosis not present

## 2024-02-19 DIAGNOSIS — E039 Hypothyroidism, unspecified: Secondary | ICD-10-CM | POA: Diagnosis not present

## 2024-02-19 DIAGNOSIS — E1121 Type 2 diabetes mellitus with diabetic nephropathy: Secondary | ICD-10-CM | POA: Diagnosis not present

## 2024-02-19 DIAGNOSIS — I10 Essential (primary) hypertension: Secondary | ICD-10-CM | POA: Diagnosis not present

## 2024-02-19 DIAGNOSIS — I480 Paroxysmal atrial fibrillation: Secondary | ICD-10-CM | POA: Diagnosis not present

## 2024-02-26 DIAGNOSIS — R7309 Other abnormal glucose: Secondary | ICD-10-CM | POA: Diagnosis not present

## 2024-02-26 DIAGNOSIS — E785 Hyperlipidemia, unspecified: Secondary | ICD-10-CM | POA: Diagnosis not present

## 2024-02-26 LAB — LAB REPORT - SCANNED
A1c: 6.6
EGFR: 34
Free T4: 1.6 ng/dL
TSH: 1.66

## 2024-05-06 NOTE — ED Provider Notes (Signed)
 [facility] Emergency Medicine Care Note       Chief Complaint  Patient presents with   Fall    History   83 year old female with a history of breast cancer diabetes hypertension A-fib on Eliquis  presenting after a fall.  She states that she was getting out of bed to go to the bathroom and walked around the bed to try to go get her cane and slipped and fell.  She fell forward and hit her head on the dresser and fell to the ground.  No loss consciousness.  Denies numbness or weakness.  She denied having any preceding lightheadedness dizziness chest pain or shortness of breath.  Her daughter does state that she has had episodes of lightheadedness recently that her doctor is working up.  She did have a recent left heart cath left they stated they were told it was reassuring.    Medical History[1] Surgical History[2] Family History[3] Social History[4]    Physical Exam   Physical Exam ED Triage Vitals  Temp 05/06/24 0650 98 F (36.7 C)  Heart Rate 05/06/24 0650 78  Resp 05/06/24 0650 15  BP 05/06/24 0650 (!) 185/88  MAP (mmHg) 05/06/24 0730 119  SpO2 05/06/24 0650 97 %  O2 Device 05/06/24 0730 None (Room air)  O2 Flow Rate (L/min) --   Weight 05/06/24 0650 89.4 kg (197 lb)   Physical Exam Vitals and nursing note reviewed.  Constitutional:      General: She is not in acute distress.    Appearance: She is not toxic-appearing.  HENT:     Head: Normocephalic. Contusion present. No raccoon eyes or Battle's sign.      Mouth/Throat:     Mouth: Mucous membranes are moist.  Eyes:     Conjunctiva/sclera: Conjunctivae normal.  Cardiovascular:     Rate and Rhythm: Normal rate.     Pulses: Normal pulses.     Heart sounds: No murmur heard.    No friction rub. No gallop.  Pulmonary:     Effort: Pulmonary effort is normal.     Breath sounds: No wheezing, rhonchi or rales.  Abdominal:     General: There is no distension.     Palpations:  Abdomen is soft.     Tenderness: There is no abdominal tenderness.  Musculoskeletal:        General: Normal range of motion.     Cervical back: Normal range of motion and neck supple. No spinous process tenderness or muscular tenderness.  Skin:    General: Skin is warm.     Capillary Refill: Capillary refill takes less than 2 seconds.  Neurological:     General: No focal deficit present.     Mental Status: She is alert and oriented to person, place, and time.     Cranial Nerves: Cranial nerves 2-12 are intact.     Sensory: Sensation is intact.     Motor: Motor function is intact.        Scoring Tools Utilized    CHA2DS2-VASc Score: 9  Glasgow Coma Scale Score: 15                     Procedures Performed   Procedures                        Medical Decision Making Problems Addressed: Fall, initial encounter: complicated acute illness or injury that poses a threat to life or bodily functions  Hypomagnesemia: complicated acute illness or injury  Amount and/or Complexity of Data Reviewed Labs: ordered. Radiology: ordered. ECG/medicine tests: ordered.  Risk OTC drugs.    Differential Diagnosis   Mechanical fall, syncope, CVA, TIA, electrolyte derangement, dehydration, UTI  Laboratory Studies Ordered and Interpreted   No leukocytosis, mild chronic anemia noted, mild hypomagnesemia but otherwise electrolytes reassuring.  Creatinine near baseline.  UA with no hematuria or signs of infection  Radiology Studies Ordered and Personally Interpreted   Head and cervical spine without acute traumatic injury.  X-ray of chest and ankle also show no bony abnormality, fracture, malalignment, pneumothorax.  Cardiac Monitor Interpretation   The Patient was maintained on a cardiac monitor.  I personally viewed and interpreted the cardiac monitor which showed an underlying rhythm of paced rhythm with a rate of 75   ECG Interpretation   ECG was personally viewed and  interpreted      Ventricular paced rhythm with a rate of 71      No acute ischemic ST-T wave changes.       No acute changes suggestive of hyperkalemia.      No WPW, LQTS, or Brugada's Syndrome. No QRS widening.       No significant changes compared to prior EKG  ED Course and Medical Decision Making   Patient presenting after mechanical nonsyncopal fall.  Afebrile and hemodynamically stable.  Nontoxic-appearing.  Normal neurologic exam.  Appropriate imaging ordered.  She had CT head and cervical spine that showed no acute traumatic injury.  Also had a chest x-ray with no rib fracture or pneumothorax.  Ankle x-ray with no fracture or malalignment.  EKG with no signs of dysrhythmia.  Ventricular paced rhythm consistent with prior.  No acute ischemic change.  Electrolytes overall reassuring other than mild hypomagnesemia which is repleted in the Emergency Department will send home on magnesium supplementation.  This could have contributed to her symptoms but from her history, fall is likely mechanical.  She was able to ambulate in the emergency department with her walker without any assistance.  She and her daughter both feel safe with discharge.  Will have her follow-up closely with PCP.  Do not feel that further workup is indicated.  Return precautions discussed   Decision Regarding Hospitalization   Considered hospitalization, however, evaluation and diagnostic testing in the emergency department does not suggest an emergent condition requiring admission or immediate intervention beyond what has been performed at this time.  The patient is safe for discharge and has been instructed to return immediately for worsening symptoms, change in symptoms or any other concerns.   Medications Delivered During ED Care   Medications  magnesium oxide 400 mg (241 mg magnesium) tablet 800 mg (800 mg oral Given 05/06/24 9177)  acetaminophen  (TYLENOL ) tablet 1,000 mg (1,000 mg oral Given 05/06/24 9177)      Discharge Medications Recommended   Discharge Medication List as of 05/06/2024  9:00 AM     START taking these medications   Details  magnesium oxide 400 mg (241 mg magnesium) tab Take 1 tablet (400 mg total) by mouth daily., Starting Thu 05/06/2024, Until Sat 06/05/2024, Normal         ED Disposition   Discharge  Clinical Impression:  1. Fall, initial encounter Acute  2. Hypomagnesemia      Rankin Oman, MD Emergency Medicine [facility]       [1] Past Medical History: Diagnosis Date   Asthma (CMD)  Cancer    (CMD)    right br ca   Diabetes mellitus    (CMD)    type 2   History of atrial fibrillation    Hypertension    Thyroid  disease    thyroid  polyps   Vertigo   [2] Past Surgical History: Procedure Laterality Date   BREAST SURGERY     Procedure: BREAST SURGERY; right breast   CARDIAC PACEMAKER PLACEMENT     Procedure: CARDIAC PACEMAKER PLACEMENT   DILATION AND CURETTAGE OF UTERUS N/A 01/08/2018   Procedure: DILATATION & CURETTAGE (D & C);  Surgeon: Wells Nat Evens, MD;  Location: [facility];  Service: Gynecology;  Laterality: N/A;   HYSTEROSCOPY N/A 01/08/2018   Procedure: HYSTEROSCOPY;  Surgeon: Wells Nat Evens, MD;  Location: [facility];  Service: Gynecology;  Laterality: N/A;  This is Dr. Anthony Card   MASTECTOMY Right    Procedure: MASTECTOMY   OTHER SURGICAL HISTORY     Procedure: OTHER SURGICAL HISTORY ([facility])   OTHER SURGICAL HISTORY     Procedure: OTHER SURGICAL HISTORY (cholycestectomy)   OTHER SURGICAL HISTORY     Procedure: OTHER SURGICAL HISTORY (thyroid  radiation)  [3] No family history on file. [4] Social History Tobacco Use   Smoking status: Never   Smokeless tobacco: Never  Substance Use Topics   Alcohol use: No   Drug use: No

## 2024-05-06 NOTE — ED Triage Notes (Signed)
 Pt here with c/o fall, pt is on Xarelto   for a-fib, pt denies LOC , pt has a hematoma to the left frontal portion of her head,

## 2024-05-10 NOTE — ED Provider Notes (Signed)
 "      Chief Complaint  Patient presents with   Fall   Emesis        HPI Patient is a 83 year old female presenting to emergency department with abdominal pain, nausea, vomiting and dizziness.  Patient had a mechanical fall 4 days ago.  She was evaluated in this department after the fall.  Her daughter is accompanying her to the department today.  Daughter states that she has been having some difficulty finding her words since the fall.  She has also had decreased appetite and yesterday started having some nausea and vomiting.  She has been having intermittent abdominal pain.  Patient is anticoagulated due to A-fib.  She is having significant bruising on the left side of her face from the fall that is progressing.  She is also having pain to the posterior left knee with spreading bruising and swelling.  She is not having any chest pain, shortness of breath.  She states that she has been having dizziness for many months and is in the process of following up with neurology.  She has not had any visual changes.  She has been ambulatory.  She lives independently though has family staying with her at this time for the holidays.  No severe headaches.  Denies dysuria or hematuria.  Last bowel movement was yesterday without blood or melena.       Patient History Medical History[1] Surgical History[2] Family History[3] Social History[4]    Review of Systems Review of Systems    Physical Exam ED Triage Vitals  Temp 05/10/24 0919 97.7 F (36.5 C)  Heart Rate 05/10/24 0921 76  Resp 05/10/24 0921 20  BP 05/10/24 0921 (!) 191/90  MAP (mmHg) 05/10/24 0921 129  SpO2 05/10/24 0921 97 %  O2 Device --   O2 Flow Rate (L/min) --   Weight --    Physical Exam Vitals and nursing note reviewed.  Constitutional:      Appearance: Normal appearance.  HENT:     Head: Normocephalic. Contusion and mass present.      Mouth/Throat:     Lips: Pink.     Mouth: Mucous membranes are dry.     Pharynx:  Oropharynx is clear. Uvula midline.  Eyes:     Extraocular Movements: Extraocular movements intact.     Conjunctiva/sclera: Conjunctivae normal.     Pupils: Pupils are equal, round, and reactive to light.  Cardiovascular:     Rate and Rhythm: Normal rate and regular rhythm.     Pulses: Normal pulses.     Heart sounds: Normal heart sounds.  Pulmonary:     Effort: Pulmonary effort is normal. No respiratory distress.     Breath sounds: Normal breath sounds. No wheezing, rhonchi or rales.  Abdominal:     General: Abdomen is flat. There is no distension.     Palpations: Abdomen is soft.     Tenderness: There is generalized abdominal tenderness and tenderness in the epigastric area. There is no right CVA tenderness, left CVA tenderness or guarding.  Musculoskeletal:        General: Normal range of motion.     Cervical back: Normal range of motion.     Left knee: Bony tenderness (posterior) present.     Left lower leg: Swelling, tenderness and bony tenderness present.       Legs:  Skin:    General: Skin is warm and dry.     Capillary Refill: Capillary refill takes less than 2 seconds.  Neurological:  General: No focal deficit present.     Mental Status: She is alert and oriented to person, place, and time.     GCS: GCS eye subscore is 4. GCS verbal subscore is 5. GCS motor subscore is 6.     Cranial Nerves: Cranial nerves 2-12 are intact.     Sensory: Sensation is intact.     Motor: Motor function is intact.     Coordination: Coordination is intact.     Gait: Gait is intact.  Psychiatric:        Mood and Affect: Mood normal.        Behavior: Behavior normal.        CHA2DS2-VASc Score: 9  Glasgow Coma Scale Score: 15    NIH Stroke Scale: 0             Procedures                         ED Course & MDM     Medical Decision Making Amount and/or Complexity of Data Reviewed Labs: ordered. Radiology: ordered. ECG/medicine tests: ordered.  Risk Prescription drug  management. Decision regarding hospitalization.      Patient's presentation is most consistent with acute presentation with potential threat to life or bodily function.  Differential Diagnosis                                                                                  CVA, electrolyte abnormality, AKI, dehydration, viscus perforation, obstruction, cystitis, pyelonephritis  Independent Historian                                                                                 Additional history/information obtained from daughter  External Data Reviewed                                                                                Labs and imaging reviewed from 05/06/2024 PDMP Reviewed- No concerning pattern identified   Patient does have comorbidities/underlying diseases, namely A-fib, which makes the patient's presentation today of higher risk, increases risk of complications and morbidity/mortality of patient management, and ultimately increases complexity of their visit today.  Laboratory Studies Ordered and Interpreted                                           Significant findings discussed in MDM  Radiology Studies Ordered and Personally Interpreted  Significant findings discussed in MDM  Cardiac Monitor Interpretation                                                                   The Patient was maintained on a cardiac monitor.  I personally viewed and interpreted the cardiac monitor which showed normal sinus rhythm with a rate of 71 The cardiac monitor was ordered secondary to the patient's history of electrolyte abnormalities and to monitor the patient for dysrhythmia.  ECG Interpretation                                                                                        EKG reviewed and interpreted by me independently shows paced rhythm, rate 72.  No QRS widening.  No QTC prolonged at 547 no ST segment elevations.  No ischemic T wave  changes.    Consultants                                                                                                   Case Discussed with hospitalist regarding admission for hypomagnesemia, dizziness  ED Course and Medical Decision Making                                                     On initial exam, patient is afebrile, hemodynamically stable with worsening bruising, dizziness, nausea, vomiting, abdominal pain.  Patient initiated on IV fluids, antiemetics while awaiting labs and imaging.  CBC without leukocytosis, anemia present with hemoglobin of 9.9, this is compared to 11.1 from several days ago.  LFTs with no significant abnormality.  BMP with overall reassuring electrolytes and improved kidney function when compared with labs from several days ago.  Patient's magnesium continues to show depletion at 1.4, replaced via IV.  Lipase within normal limit.  Initial troponin elevated at 14.  CT head without evidence of acute intracranial abnormality.  Ultrasound evaluation of left lower extremity without DVT.  CTA of chest abdomen pelvis shows a small to moderate pericardial effusion which has been present previous imaging.  Some small bilateral pleural effusions and pulmonary edema.  Bladder wall thickening is present with possibility for acute cystitis, no other acute intra-abdominal abnormalities.  Reassessment of patient at 1220, nausea has subsided.  Discussed imaging results showing continued pericardial effusion, elevated troponin and hypomagnesemia.  Discussed with her possibility of admission for  further workup, which she is amenable to.  Patient's delta troponin decreased at 12.  Patient's urine does show evidence of acute infection with 31-40 WBCs present on microscopy.  Hospitalization Decision / Disposition                                                           Admission  Medications Delivered during ED Care                                                        Medications  sodium chloride  (bolus) 0.9 % bolus 1,000 mL (1,000 mL intravenous New Bag 05/10/24 0955)  magnesium sulfate 2g in sterile water 50 mL IVPB (has no administration in time range)  ondansetron  (ZOFRAN ) injection 4 mg (4 mg intravenous Given 05/10/24 0955)   Problems addressed during today's visit                                                     Multiple problems were addressed during today's visit as outlined   ED Disposition:  Data Unavailable  ED Prescriptions   None         [1] Past Medical History: Diagnosis Date   Asthma (CMD)    Cancer    (CMD)    right br ca   Diabetes mellitus (CMD)    type 2   History of atrial fibrillation    Hypertension    Thyroid  disease    thyroid  polyps   Vertigo   [2] Past Surgical History: Procedure Laterality Date   BREAST SURGERY     Procedure: BREAST SURGERY; right breast   CARDIAC CATHETERIZATION     CARDIAC PACEMAKER PLACEMENT     Procedure: CARDIAC PACEMAKER PLACEMENT   DILATION AND CURETTAGE OF UTERUS N/A 01/08/2018   Procedure: DILATATION & CURETTAGE (D & C);  Surgeon: Wells Nat Evens, MD;  Location: [facility];  Service: Gynecology;  Laterality: N/A;   HYSTEROSCOPY N/A 01/08/2018   Procedure: HYSTEROSCOPY;  Surgeon: Wells Nat Evens, MD;  Location: [facility];  Service: Gynecology;  Laterality: N/A;  This is Dr. Anthony Card   MASTECTOMY Right    Procedure: MASTECTOMY   OTHER SURGICAL HISTORY     Procedure: OTHER SURGICAL HISTORY (BT[facility])   OTHER SURGICAL HISTORY     Procedure: OTHER SURGICAL HISTORY (cholycestectomy)   OTHER SURGICAL HISTORY     Procedure: OTHER SURGICAL HISTORY (thyroid  radiation)  [3] No family history on file. [4] Social History Tobacco Use   Smoking status: Never   Smokeless tobacco: Never  Substance Use Topics   Alcohol use: No   Drug use: No  "

## 2024-05-10 NOTE — Progress Notes (Signed)
 "   Division of Pharmacy Services  Medication History Completion Note  Name/DOB/Age of Patient: Gloria Joseph / 12-Apr-1941 / 83 y.o.  Location: [facility] ED  Type: Hospital admission & Modality: In-Person  Confirmed two patient identifiers: Yes  Confirmed patient is alert & oriented: Yes  Medication History Source (Med History Informants):  Patient Family Member   Which family member responsible for patient's medication management/administration at home? (enter name/phone number): Daughter Bari [phone]  Dispense History   Asked about any missing medications (such as pumps, injectable meds, TPN, OTC, etc): Yes  PTA Med List:  Prior to Admission Medications     Reviewed by Camie Filbert Bamberg, CPhT on 05/10/24 at 1440    Medication Sig Last Dose Informant Taking? Status  calcium -magnesium-zinc 333-133-5 mg tab Take 1 tablet by mouth Once Daily. 05/09/2024 Morning Other, Self, Child Yes Active  cholecalciferol  25 mcg (1,000 unit) cap Take 1,000 Units by mouth Once Daily. 05/09/2024 Morning Other, Self, Child Yes Active  citalopram  (CeleXA ) 20 mg tablet Take 20 mg by mouth Once Daily. 05/09/2024 Morning Other, Self, Child Yes Active  co-enzyme Q-10 50 mg cap Take 50 mg by mouth Once Daily. 05/09/2024 Morning Other, Self, Child Yes Active  cyanocobalamin  (VITAMIN B12) 1,000 mcg tablet Take 1,000 mcg by mouth Once Daily. 05/09/2024 Morning Other, Self, Child Yes Active  flaxseed oiL 1,000 mg capsule Take 1,000 mg by mouth Once Daily. 05/09/2024 Other, Self, Child Yes Active  gabapentin  (NEURONTIN ) 300 mg capsule Take 300 mg by mouth nightly. 05/08/2024 Bedtime Other, Self, Child Yes Active  hydrALAZINE  (APRESOLINE ) 100 mg tablet Take 100 mg by mouth daily. 05/09/2024 Morning Other, Self, Child Yes Active         Med Note (EFIRD, SARA C   Mon May 10, 2024 12:52 PM) UNABLE TO VERIFY RECENT FILL   irbesartan (AVAPRO) 150 mg tablet Take 150 mg by mouth 2 (two) times a day.  05/09/2024 Morning Other, Self, Child Yes Active  levothyroxine  (Synthroid ) 75 mcg tablet Take 75 mcg by mouth in the morning. 05/09/2024 Morning Other, Self, Child Yes Active         Med Note (EFIRD, SARA C   Mon May 10, 2024  2:38 PM) Pt taking BRAND NAME only.   magnesium oxide 400 mg (241 mg magnesium) tab Take 1 tablet (400 mg total) by mouth daily. 05/09/2024 Morning Other, Self, Child Yes Active  meclizine  (ANTIVERT ) 25 mg tablet Take 25 mg by mouth Once Daily. 05/09/2024 Morning Other, Self, Child Yes Active  metFORMIN  (GLUCOPHAGE ) 1,000 mg tablet Take 1,000 mg by mouth nightly. 05/08/2024 Bedtime Other, Self, Child Yes Active  metoprolol  succinate (TOPROL  XL) 25 mg 24 hr tablet Take 25 mg by mouth daily. 05/09/2024 Morning Other, Self, Child Yes Active  omega-3 acid ethyl esters (LOVAZA ) 1 gram capsule Take 1 g by mouth Once Daily. 05/09/2024 Morning Other, Self, Child Yes Active  pravastatin  (PRAVACHOL ) 80 mg tablet Take 80 mg by mouth Once Daily. 05/09/2024 Morning Other, Self, Child Yes Active  solifenacin (VESICARE) 10 mg tablet Take 1 tablet by mouth daily. 05/09/2024 Morning Other, Self, Child Yes Active  Xarelto  20 mg tablet Take 20 mg by mouth Once a day with a meal. TAKE WITH FOOD 05/04/2024 Other, Self, Child Yes Active         Med Note SHARAN, SARA C   Mon May 10, 2024  2:40 PM) LF 05/06/24 #90/90DS. Pt was still holding this preparing for a procedure she had from last week.  Audit from Redirected Encounters   **Prior to Admission medications have not yet been reviewed for this encounter**     Selected Pharmacy:  [facility] [place], [place] - [id number] - [place], [place] - [address] - PHONE: [phone] - FAX: [phone]  Comments: None  Medications reconciled by provider: No   Signature/Co-signature, if required: Camie Filbert Bamberg, CPhT   Date/Time: 05/10/2024 2:40 PM  "

## 2024-05-10 NOTE — ED Notes (Signed)
 Pt placed on tele box 8605, confirmed with telemetry monitoring.    Stevenson Louder, RN 05/10/24 [phone]

## 2024-05-10 NOTE — ED Triage Notes (Signed)
 Patient presents with daughter and c/o nausea and vomiting.  Fell on 12/18 and was seen at that time.  Patient reports worsening bruising and pain.  Bruisng to left side of face.  Hematoma to left forehead.  Also c/o left leg pain and bruising.  Daughter reports some slurred speech since 12/19.

## 2024-05-11 NOTE — Progress Notes (Signed)
 Case Management Update  Date: 05/11/2024   Time: 1:02 PM   Patient Type: Observation  PT and OT have evaluated the pt and both are recommending home health. Phone call to the pt's daughter, Bari Fontana, to discuss recommendations. Bari is agreeable to home health for the pt. When asked she did not have a home health agency preference other than they accept the pt's insurance.  Bari Mayotte with [facility] was able to accept the referral. Bari, the pt's daughter, was provided with [facility]'s number and advised to call [facility] if they don't hear from them by the afternoon of 05/14/2024. She verbalized understanding.  SW will continue to follow and assist as needed.  Case Management Coordination Status: Coordination Complete   Anticipated Discharge Location: Home with Home Health  If Plan A discharging location is not feasible: Potential Plan B: To be determined        Amber Zannie Hefty, MSW, LCSW   Social Worker II [facility]

## 2024-05-11 NOTE — Progress Notes (Signed)
 Case Management Discharge Note        CSN: [id number] DOB: 13-Oct-1940 Service: General Medicine Location: 311/1  Patient Class: Observation  DC Disposition: : Home Health Care  Discharge DC Disposition: : Home Health Care Homecare Referral: (OT) Occupational Therapy, (PT) Physical Therapy Homecare/Hospice Agency(s) chosen: [facility]  Discharge Referrals Patient Preference: Chosen geographical local area/county shared with patient/family: Yes Date chosen geographical local area/county list shared with patient/family: 05/11/24 Patient Preference for Post-Acute Provider Form completed: Yes Case closed, patient/family agree with disposition plan: Yes  Amber Zannie Hefty, MSW, LCSW Social Worker II [facility]

## 2024-05-11 NOTE — Unmapped External Note (Signed)
 Acute Physical Therapy Evaluation   Precautions: Other Therapy Precautions: Fall risk, Skin Integrity  Medical Diagnosis/Course: PT Diagnosis/Course PT Medical Dx: Acute cystitis Pertinent Medical Course: Patient presents from home with confusion/weakness. Patient admitted to [facility] for acute cystitis. Therapies ordered to assess functional mobility, functional independence, and assist with discharge planning prn.    ASSESSMENT SUMMARY   Gloria Joseph is a 83 y.o. admitted on 05/10/2024. PT orders received and patient assessed. At baseline the patient's prior level of function was independent with ADLs/IADLs and they ambulated household distances with a rollator. This patient with 1 day length of stay presents with deficits including: dizziness, strength deficits, balance deficits, ambulation deficits and limited activity tolerance. Pt with adequate functional strength/mobility completes transfers without physical assist. Pt reports increased dizziness from seated>standing, vitals were assessed and reflect an orthostatic drop in BP, see in objective findings. Symptoms resolved with time and pt with good standing tolerance ambulated 140' in hall w/ RW and CGA-SBA for safety, no loss of balance noted. Pt was advised to take time for symptoms to resolve following transfers to improve safety with mobility and reduce risk of falls.  Based on clinical findings pt is near her baseline, skilled care is not required in the acute setting at this time. Pt was encouraged to mobilize with staff while admitted to maintain current level of function, nursing was updated on pt's level of mobility. Following D/C pt will benefit from [facility] PT and increased family support to address noted deficits.    Problem list: Impairments/Limitations: Dizziness, Activity Tolerance Deficits, Ambulation Deficits, Balance Deficits, Muscle weakness, Mobility deficits   PT Recommendation: PT Recommendation: Home PT, Home with 24  hour supervision, Home with intermittent assistance PT Equipment Recommended: None  Home Living Environment: Type of Home House  Home Layout One level, Performs ADLs on one level, Bedroom on main level  Exterior Stairs - number 1 STE  Exterior Stairs - rails None  [employer] - number 1 step to Sungard Stairs - Heritage Manager / Tub Tub/Shower Unit  Geologist, Engineering, Higher Education Careers Adviser, Midwife mostly  Additional Comments     Prior Level of Function: Level of Independence Independent  Lives With Alone  Person(s) able to assist at d/c Daughter 24/7  Patient Responsibilities    Requires Assist With     PERTINENT MEDICAL INFORMATION   PT Treatment Diagnosis:    Reduced Mobility, Unspecified Abnormalities of Gait, and Unsteadiness on feet  Past Medical History: Medical History[1]  Past Surgical History: Surgical History[2]   SUBJECTIVE  Communication:  Communication: Patient, Nursing, OT Communication Details: nursing cleared pt for eval, nursing updated following session  General Family/Caregiver Present: none Was an interpreter used?: No Fall in the last year?: Yes Fall/Injury Details: multiple bruses Feel unsteady or off balance?: Yes  Subjective: pt was agreeable to work with therapy   OBJECTIVE   Pain: Pain Assessment Pain Assessment: No/denies pain  Cognition: Orientation Level: Oriented x4  Range of Motion: Overall ROM Assessment: Within Functional Limits  Strength: Overall Strength Assessment: Exceptions to [facility] - Impaired Details R Shoulder Flexion: 4/5 L Shoulder Flexion: 4/5 R Hip Flexion L2 (L2-L3): 4/5 R Knee Extension L3 (L4-L5): 4/5 L Hip Flexion L2 (L2-L3): 4/5 L Knee Extension L3 (L4-L5): 4/5  Skin Integrity and Edema:  Skin Integrity & Edema Skin: Intact except Exceptions: multiple bruses from falls Edema:  No  edema   Balance: Sitting - Static: Distant supervision Sitting - Dynamic: Distant supervision Standing - Static: Close supervision Standing - Dynamic: Close supervision Balance Additional Comments: w/ RW  Functional Mobility:  Bed Mobility Supine to Sit: Distant supervision Sit to Supine: Distant supervision Transfers Assistive Device: Walker-rolling Sit to Stand: Close supervision Stand to Sit: Close supervision Mobility Gait Distance (ft): 140 ft Gait Assistance: Close supervision, Contact Guard Assistance Assistive Device: Walker-rolling  Condition After Therapy:  Back to bed, Nursing notified of condition, All needs within reach, Alarm activated, Lines intact  Therapy Activity Vitals  Therapy Activity Vitals - At Rest At Rest Position: Supine At Rest BP: (!) 167/97  BP Location: Left arm BP Method: Automatic   Therapy Activity Vitals - At Rest At Rest Position: Seated At Rest BP: 156/87 BP Location: Left arm BP Method: Automatic   Therapy Activity Vitals - At Rest At Rest Position: Standing At Rest BP: 158/74 BP Location: Left arm BP Method: Automatic   INTERVENTIONS   Skilled PT Treatment Provided  Gait/Mobility: Pt ambulated 140' into hall with RW and CGA-SBA for safety. Cues were provided for sequencing, safe use of RW and to improve foot clearance to reduce risk of falls. Gait training was completed in order to improve safety with ambulation.           PERFORMANCE OUTCOME MEASURES   AM-PAC - Basic Mobility:    Flowsheet Row Most Recent Value  AM-PAC 6-Clicks - Basic Mobility  Turning from you back to your side while in a flat bed without using bed rails? None  Moving from lying on your back to sitting on the side of a flat bed without using bed rails? None  Moving to and from a bed to a chair (including a wheelchair)? None  Standing up from a chair using your arms (e.g, wheelchair, or bedside chair)? None  To walk in a hospital room? A little   Climbing 3-5 steps with a railing? A little  AM-PAC Total Score 22   Gait Velocity/Speed   (67m walk test) 0.66 m/s   PT PLAN OF CARE   Rehab Potential:  Rehab Potential: Good Complexity/Co-morbidities that Impact POC: Diabetes, Lifestyle, Reduced activity level, Severity of condition Impairments/Limitations: Dizziness, Activity Tolerance Deficits, Ambulation Deficits, Balance Deficits, Muscle weakness, Mobility deficits  Plan:  Patient and Family Goals: return home Treatment Plan/Goals Established with Patient/Caregiver: Yes Planned Treatment/Interventions: Patient/Caregiver education, Therapeutic activity, Therapeutic exercise, Gait/mobility training, Neuromuscular re-education PT Frequency: Discharge PT Duration: Discharge  TREATMENT TIME   Time in Timed codes: 9 Total Treatment Time: 30 PT Eval Mod Complexity minutes: 21   Treatment/Procedures Time Entry Gait/Mobility minutes: 9   Charges           05/11/2024   Code Description Service Provider Modifiers Quantity  [id number] Hc Pt Gait Training Therapy [place], PT GP 1  [id number] Hc Pt Physical Therapy Evaluation Mod Complex 30 Mins [place], PT GP 1        Time of Service Note Type Status  None                    [1] Past Medical History: Diagnosis Date   Asthma (CMD)    Cancer    (CMD)    right br ca   Diabetes mellitus (CMD)    type 2   History of atrial fibrillation    Hypertension    Thyroid  disease    thyroid  polyps   Vertigo   [  2] Past Surgical History: Procedure Laterality Date   BREAST SURGERY     Procedure: BREAST SURGERY; right breast   CARDIAC CATHETERIZATION     CARDIAC PACEMAKER PLACEMENT     Procedure: CARDIAC PACEMAKER PLACEMENT   DILATION AND CURETTAGE OF UTERUS N/A 01/08/2018   Procedure: DILATATION & CURETTAGE (D & C);  Surgeon: Wells Nat Evens, MD;  Location: [facility] MAIN OR;  Service: Gynecology;  Laterality: N/A;    HYSTEROSCOPY N/A 01/08/2018   Procedure: HYSTEROSCOPY;  Surgeon: Wells Nat Evens, MD;  Location: [facility] MAIN OR;  Service: Gynecology;  Laterality: N/A;  This is Dr. Anthony Card   MASTECTOMY Right    Procedure: MASTECTOMY   OTHER SURGICAL HISTORY     Procedure: OTHER SURGICAL HISTORY (BTL)   OTHER SURGICAL HISTORY     Procedure: OTHER SURGICAL HISTORY (cholycestectomy)   OTHER SURGICAL HISTORY     Procedure: OTHER SURGICAL HISTORY (thyroid  radiation)

## 2024-05-11 NOTE — Progress Notes (Signed)
 Case Management Update  Date: 05/11/2024   Time: 7:35 AM   Patient Type: Observation  Pt brought in under observation with nausea, vomiting, and generalized weakness for a couple of days. She was diagnosed with acute cystitis. Pt also had a fall on 05/06/2024 from which she sustained a large hematoma to her forehead and bruising to her face.  She is being treated with IV abx and PT and OT evaluations has been ordered.  Discharge needs TBD. SW will continue to follow and assist as needed.  Case Management Coordination Status: Coordination In-Progress   Anticipated Discharge Location: Home  If Plan A discharging location is not feasible: Potential Plan B: To be determined  Public Affairs Consultant, MSW, LCSW   Social Worker II [facility]

## 2024-05-11 NOTE — Unmapped External Note (Signed)
 Acute OT Evaluation   Precautions: Other Therapy Precautions: Fall risk, Skin Integrity   Medical Diagnosis/Course: OT Diagnosis/Course OT Medical Dx: Acute cystitis Pertinent Medical Course: Patient presents from home with confusion/weakness. Patient admitted to [facility] for acute cystitis. Therapies ordered to assess functional mobility, functional independence, and assist with discharge planning prn.   ASSESSMENT SUMMARY   Gloria Joseph is a 83 y.o. admitted on 05/10/2024. Patient is a very pleasant female who presents for acute cystitis. Patient is from home alone, but has had a recent history of falls. Other pertinent medical history includes CAD, HFimpEF, HTN, HLD, SB s/p PPM, and CKD-III. Patient requiring SBA to CGA for all mobility this date in bed and to and from [facility]. Patient is likely safe to return home with 24/7 supervision(daughter available per patient) and would recommend HHOT to promote independence and safety in the home.   Problem List:    Impairments/Limitations: Activity Tolerance Deficits, Balance Deficits, Basic Activity of Daily living deficits, Muscle weakness, Mobility deficits   OT Recommendation: OT Recommendation: Home with 24 hour supervision, Home OT OT Equipment Recommended: None     Home Living Environment: Type of Home House  Home Layout One level  Exterior Stairs - rails None  Exterior Stairs - number 1 STE  Interior Stairs - rails None  [employer] - number 1 step to [employer] [place], Higher Education Careers Adviser, Primary School Teacher point Oceanographer)  Equipment Currently [employer] mostly  Additional Comments     Prior Level of Function:  Level of Independence Independent  Lives With Alone  Person(s) able to assist at d/c Daughter 24/7  Patient Responsibilities     Requires Assist With      PROBLEM LIST   OT Treatment Diagnosis:     Attention and Concentration Deficit (NOT ADHD), Difficulty Walking (not elsewhere specified) , Muscle weakness  , and Needing assistance w/ self-care   Past Medical History: Medical History[1]  Past Surgical History: Surgical History[2]  SUBJECTIVE  Team Communication: Interdisciplinary Team Communication Communication: Patient, Nursing, PT Communication Details: Requested PT to look at orthostatics due to patient reporting that her dizziness/passing out occurs often when she first gets up.     Fall in the last year?: Yes Feel unsteady or off balance?: Yes Subjective: I just want to get back home for Christmas  OBJECTIVE  Pain: Pain Assessment Pain Assessment: No/denies pain  Cognition:  Cognition Orientation Level: Oriented x4 Overall Cognitive Status: Within Functional Limits   ROM:  ROM Assessment Overall ROM Assessment: Within Functional Limits  Strength:  Strength Assessment Overall Strength Assessment: Exceptions to [facility] - Comments RUE Strength: 4/5 LUE Strength: 4/5  Skin Integrity and Edema:  Skin Integrity & Edema Skin: Intact in visualized areas Edema: No edema   Sensation:  Sensation Light Touch: No apparent deficits  Coordination:  Coordination Fine Motor Function:  (No apparent deficits)   Vision & Hearing: Vision/Perception and Hearing  Current Vision: No visual deficits Hearing: Within Functional Limits  and Tone: Tone Assessment Tone Assessment: Within Functional Limits  Balance:  Sitting - Static: Distant supervision Sitting - Dynamic: Distant supervision Standing - Static: Close supervision Standing - Dynamic: Close supervision, Contact Guard Assistance  Bed Mobility and Transfers: Bed Mobility Rolling: Independent Supine to Sit: Independent, Distant supervision Sit to Supine: Independent, Distant supervision  Transfers Sit to Stand: Distant supervision Stand to Sit: Distant supervision Bed to/from Chair - Level  of  Assistance: Close supervision, Contact Guard Assistance    ADLs:    Condition After Therapy: Condition After Therapy: Back to bed, Nursing notified of condition, All needs within reach, Alarm activated, Staff present in room, Lines intact  INTERVENTIONS   Self Care: Therapist engaging patient in toileting at [facility] working on safety with transfers, therapist educating on hand placement and waiting when standing up to reduce risk of dizziness/falls. Therapist providing CGA for transfer and clothing management. Therapist coaching patient through sitting on EOB, BSC, and then EOB before moving to reduce dizziness risk.        Response to Interventions: Improved Activity tolerance , Improved ADL performance , and Improved Attention    PERFORMANCE OUTCOME MEASURES  AM-PAC - Daily Activity:    Flowsheet Row Most Recent Value  AM-PAC 6-Clicks - Daily Activity  Lower Body Clothing Activity None  Bathing A little  Toileting A little  Upper Body Clothing Activity None  Personal Grooming None  Eating Meals None  AM-PAC Total Score 22    OT PLAN OF CARE   Rehab Potential: Rehab Potential Rehab Potential: Good Complexity/Co-morbidities that Impact POC: Infection, Reduced activity level Impairments/Limitations: Activity Tolerance Deficits, Balance Deficits, Basic Activity of Daily living deficits, Muscle weakness, Mobility deficits  Plan: Plan Treatment Plan/Goals Established with Patient/Caregiver: Yes Planned Treatment/Interventions: Basic activities of daily living, Instrumental Activities of Daily Living, Patient/Caregiver education, Equipment training, Functional Mobility training, Therapeutic exercises, Therapeutic activities OT Frequency:  (Minimum 3x/week; M-F) OT Duration: For 4 weeks OT Re-eval Due: 06/08/24  OT Goals: Encounter Problems     Encounter Problems (Active)     Occupational Therapy     Patient will perform grooming Independently at the sink     Start:   05/11/24    Expected End:  06/08/24         Patient will perform lower body dressing Independently     Start:  05/11/24    Expected End:  06/08/24         Patient will perform toileting Independently     Start:  05/11/24    Expected End:  06/08/24             Patient/caregiver will demonstrate home exercise program independently     Start:  05/11/24    Expected End:  06/08/24                  TREATMENT TIME    Time in Timed codes: 15 Total Treatment Time: 36 OT Eval Moderate Complexity minutes: 21   Treatment/Procedures Time Entry Self Care/Home Management Training minutes: 15    Charges           05/11/2024   Code Description Service Provider Modifiers Quantity  [id number] Hc Ot Self-Care/Home Mgmt Training Each 15 Minutes Harlene Wanda Lunger, OT/L GO 1  [id number] Hc Ot Occupational Therapy Eval Mod Complex 45 Mins Harlene Wanda Lunger, OT/L GO 1        Time of Service Note Type Status  None                 [1] Past Medical History: Diagnosis Date   Asthma (CMD)    Cancer    (CMD)    right br ca   Diabetes mellitus (CMD)    type 2   History of atrial fibrillation    Hypertension    Thyroid  disease    thyroid  polyps   Vertigo   [2] Past Surgical History: Procedure Laterality Date  BREAST SURGERY     Procedure: BREAST SURGERY; right breast   CARDIAC CATHETERIZATION     CARDIAC PACEMAKER PLACEMENT     Procedure: CARDIAC PACEMAKER PLACEMENT   DILATION AND CURETTAGE OF UTERUS N/A 01/08/2018   Procedure: DILATATION & CURETTAGE (D & C);  Surgeon: Wells Nat Evens, MD;  Location: [facility] MAIN OR;  Service: Gynecology;  Laterality: N/A;   HYSTEROSCOPY N/A 01/08/2018   Procedure: HYSTEROSCOPY;  Surgeon: Wells Nat Evens, MD;  Location: [facility] MAIN OR;  Service: Gynecology;  Laterality: N/A;  This is Dr. Anthony Card   MASTECTOMY Right    Procedure: MASTECTOMY   OTHER SURGICAL HISTORY     Procedure: OTHER SURGICAL  HISTORY (BTL)   OTHER SURGICAL HISTORY     Procedure: OTHER SURGICAL HISTORY (cholycestectomy)   OTHER SURGICAL HISTORY     Procedure: OTHER SURGICAL HISTORY (thyroid  radiation)

## 2024-05-11 NOTE — Care Plan (Signed)
  Problem: PAIN - ADULT Goal: Verbalizes/displays adequate comfort level or baseline comfort level Description: INTERVENTIONS: 1. Encourage pt to monitor pain and request assistance 2. Assess pain using appropriate pain scale 3. Administer analgesics based on type and severity of pain and evaluate response 4. Implement non-pharmacological measures as appropriate and evaluate response 5. Consider cultural and social influences on pain and pain management 6. Notify LIP if interventions unsuccessful or patient reports new pain Outcome: Progressing   Problem: INFECTION - ADULT Goal: Absence of infection during hospitalization Description: INTERVENTIONS: 1. Assess and monitor for signs and symptoms of infection 2. Monitor lab/diagnostic results 3. Monitor all insertion sites i.e., indwelling lines, tubes and drains 4. Monitor endotracheal (as able) and nasal secretions for changes in amount and color 5. Institute appropriate cooling/warming therapies per order 6. Administer medications as ordered 7. Instruct and encourage patient and family to use good hand hygiene technique 8. Identify and instruct in appropriate isolation precautions for identified infection/condition Outcome: Progressing   Problem: Safety - Adult Goal: Free from fall injury Description: INTERVENTIONS: 1. Assess pt frequently for physical needs 2. Identify cognitive and physical deficits and behaviors that affect risk of falls. 3. Institute fall precautions as indicated by assessment. 4. Educate pt/family on patient safety including physical limitations 5. Instruct pt to call for assistance with activity based on assessment 6. Modify environment to reduce risk of injury 7. Consider OT/PT consult to assist with strengthening/mobility Outcome: Progressing

## 2024-05-26 LAB — LAB REPORT - SCANNED
A1c: 6.2
EGFR: 35
Free T4: 1.6 ng/dL
TSH: 11.4

## 2024-05-31 ENCOUNTER — Other Ambulatory Visit: Payer: Self-pay | Admitting: Registered Nurse

## 2024-05-31 DIAGNOSIS — R4789 Other speech disturbances: Secondary | ICD-10-CM

## 2024-05-31 DIAGNOSIS — S0990XD Unspecified injury of head, subsequent encounter: Secondary | ICD-10-CM

## 2024-05-31 DIAGNOSIS — R42 Dizziness and giddiness: Secondary | ICD-10-CM

## 2024-05-31 DIAGNOSIS — R410 Disorientation, unspecified: Secondary | ICD-10-CM

## 2024-05-31 DIAGNOSIS — I63412 Cerebral infarction due to embolism of left middle cerebral artery: Secondary | ICD-10-CM
